# Patient Record
Sex: Female | Born: 1990 | Race: White | Hispanic: No | Marital: Single | Smoking: Current every day smoker
Health system: Southern US, Community
[De-identification: ages and names within clinical notes are randomized; demographics above are authoritative.]

## PROBLEM LIST (undated history)

## (undated) ENCOUNTER — Inpatient Hospital Stay (HOSPITAL_COMMUNITY): Payer: Self-pay

## (undated) ENCOUNTER — Emergency Department (HOSPITAL_COMMUNITY): Admission: EM | Discharge status: Absent without leave | Payer: Medicaid Other

## (undated) ENCOUNTER — Inpatient Hospital Stay (HOSPITAL_COMMUNITY): Payer: Medicaid Other | Admitting: Obstetrics & Gynecology

## (undated) DIAGNOSIS — F418 Other specified anxiety disorders: Secondary | ICD-10-CM

## (undated) DIAGNOSIS — F909 Attention-deficit hyperactivity disorder, unspecified type: Secondary | ICD-10-CM

## (undated) DIAGNOSIS — F329 Major depressive disorder, single episode, unspecified: Secondary | ICD-10-CM

## (undated) DIAGNOSIS — F32A Depression, unspecified: Secondary | ICD-10-CM

## (undated) DIAGNOSIS — N390 Urinary tract infection, site not specified: Secondary | ICD-10-CM

## (undated) DIAGNOSIS — R87619 Unspecified abnormal cytological findings in specimens from cervix uteri: Secondary | ICD-10-CM

## (undated) DIAGNOSIS — F419 Anxiety disorder, unspecified: Secondary | ICD-10-CM

## (undated) DIAGNOSIS — M549 Dorsalgia, unspecified: Secondary | ICD-10-CM

## (undated) DIAGNOSIS — G8929 Other chronic pain: Secondary | ICD-10-CM

## (undated) DIAGNOSIS — IMO0002 Reserved for concepts with insufficient information to code with codable children: Secondary | ICD-10-CM

## (undated) DIAGNOSIS — G43909 Migraine, unspecified, not intractable, without status migrainosus: Secondary | ICD-10-CM

## (undated) DIAGNOSIS — R87629 Unspecified abnormal cytological findings in specimens from vagina: Secondary | ICD-10-CM

## (undated) HISTORY — DX: Depression, unspecified: F32.A

## (undated) HISTORY — DX: Reserved for concepts with insufficient information to code with codable children: IMO0002

## (undated) HISTORY — DX: Major depressive disorder, single episode, unspecified: F32.9

## (undated) HISTORY — PX: TOOTH EXTRACTION: SUR596

## (undated) HISTORY — DX: Unspecified abnormal cytological findings in specimens from vagina: R87.629

## (undated) HISTORY — DX: Attention-deficit hyperactivity disorder, unspecified type: F90.9

## (undated) HISTORY — DX: Unspecified abnormal cytological findings in specimens from cervix uteri: R87.619

## (undated) HISTORY — DX: Urinary tract infection, site not specified: N39.0

---
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
## 2012-08-21 LAB — OB RESULTS CONSOLE GBS: GBS: POSITIVE

## 2012-09-01 ENCOUNTER — Encounter (HOSPITAL_COMMUNITY): Payer: Self-pay | Admitting: *Deleted

## 2012-09-01 ENCOUNTER — Emergency Department (HOSPITAL_COMMUNITY)
Admission: EM | Admit: 2012-09-01 | Discharge: 2012-09-01 | Disposition: A | Discharge status: Home / Self Care | Payer: Self-pay | Attending: Emergency Medicine | Admitting: Emergency Medicine

## 2012-09-01 DIAGNOSIS — Z3202 Encounter for pregnancy test, result negative: Secondary | ICD-10-CM | POA: Insufficient documentation

## 2012-09-01 DIAGNOSIS — N898 Other specified noninflammatory disorders of vagina: Secondary | ICD-10-CM | POA: Insufficient documentation

## 2012-09-01 DIAGNOSIS — F17200 Nicotine dependence, unspecified, uncomplicated: Secondary | ICD-10-CM | POA: Insufficient documentation

## 2012-09-01 DIAGNOSIS — N939 Abnormal uterine and vaginal bleeding, unspecified: Secondary | ICD-10-CM

## 2012-09-01 DIAGNOSIS — R197 Diarrhea, unspecified: Secondary | ICD-10-CM | POA: Insufficient documentation

## 2012-09-01 DIAGNOSIS — Z79899 Other long term (current) drug therapy: Secondary | ICD-10-CM | POA: Insufficient documentation

## 2012-09-01 DIAGNOSIS — R109 Unspecified abdominal pain: Secondary | ICD-10-CM | POA: Insufficient documentation

## 2012-09-01 LAB — POCT PREGNANCY, URINE: Preg Test, Ur: NEGATIVE

## 2012-09-01 MED ORDER — IBUPROFEN 600 MG PO TABS: 600.0000 mg | ORAL_TABLET | Freq: Four times a day (QID) | ORAL | Status: DC | PRN

## 2012-09-01 MED ORDER — IBUPROFEN 800 MG PO TABS
800.0000 mg | ORAL_TABLET | Freq: Once | ORAL | Status: AC
  Administered 2012-09-01: 800 mg via ORAL
  Filled 2012-09-01: qty 1

## 2012-09-01 NOTE — ED Notes (Signed)
Pt reports heavy bleeding x 7 days. Pt has used 6 pads in a 24 hr time frame. Pt also c/o lower abdominal pain with cramping and diarrhea 2 days daily. Reports taking 3 pregnancy tests "lately with negative results".   Denies N/V and fever. NAD noted

## 2012-09-01 NOTE — ED Provider Notes (Signed)
History   This chart was scribed for Jessica Gaskins, MD by Charolett Bumpers, ED Scribe. The patient was seen in room APA08/APA08. Patient's care was started at 1505.   CSN: [id number]  Arrival date & time 09/01/12  1420   First MD Initiated Contact with Patient 09/01/12 1505      Chief Complaint  Patient presents with  . Vaginal Bleeding    The history is provided by the patient. No language interpreter was used.   Jessica [name] is a 22 y.o. female who presents to the Emergency Department complaining of constant, moderate vaginal bleeding for the past week with associated lower abdominal cramping. She states that the bleeding is worsening and is using 5-6 pads daily. She denies any syncope, weakness, dizziness, back pain, dysuria. She denies any prior medical hx. She states that her LNMP was last month and has not had any positive pregnancy tests. Marland Kitchen    PMH - none  Past Surgical History  Procedure Date  . Tooth extraction     No family history on file.  History  Substance Use Topics  . Smoking status: Current Every Day Smoker  . Smokeless tobacco: Not on file  . Alcohol Use: No    OB History    Grav Para Term Preterm Abortions TAB SAB Ect Mult Living                  Review of Systems  Gastrointestinal: Positive for abdominal pain.  Genitourinary: Positive for vaginal bleeding. Negative for dysuria.  Musculoskeletal: Negative for back pain.  Neurological: Negative for dizziness, syncope and weakness.  All other systems reviewed and are negative.    Allergies  Review of patient's allergies indicates no known allergies.  Home Medications   Current Outpatient Rx  Name  Route  Sig  Dispense  Refill  . NORGESTIM-ETH ESTRAD TRIPHASIC 0.18/0.215/0.25 MG-35 MCG PO TABS   Oral   Take 1 tablet by mouth daily.           BP 134/73  Pulse 100  Temp 97.7 F (36.5 C) (Oral)  Resp 20  Ht 5\' 4"  (1.626 m)  Wt 146 lb 1 oz (66.254 kg)  BMI 25.07 kg/m2   SpO2 100%  LMP 08/25/2012  Physical Exam CONSTITUTIONAL: Well developed/well nourished HEAD AND FACE: Normocephalic/atraumatic EYES: EOMI/PERRL, conjunctiva pink ENMT: Mucous membranes moist NECK: supple no meningeal signs SPINE:entire spine nontender CV: S1/S2 noted, no murmurs/rubs/gallops noted LUNGS: Lungs are clear to auscultation bilaterally, no apparent distress ABDOMEN: soft, nontender, no rebound or guarding GU:no cva tenderness, small amount of vaginal blood noted. No active bleeding. Os closed. Chaperone present.  NEURO: Pt is awake/alert, moves all extremitiesx4 EXTREMITIES: pulses normal, full ROM SKIN: warm, color normal PSYCH: no abnormalities of mood noted  ED Course  Procedures   DIAGNOSTIC STUDIES: Oxygen Saturation is 100% on room air, normal by my interpretation.    COORDINATION OF CARE:  15:12-Discussed planned course of treatment with the patient including a urine preg and pelvic exam, who is agreeable at this time.   15:15-Medication Orders: Ibuprofen (Advil, Motrin) tablet 800 mg-once.   15:20-Pelvic exam preformed with chaperon present.  Stable for d/c and followup with gyn.  She is not pregnant, no distress and does not appear clinically to be acutely anemic     MDM  Nursing notes including past medical history and social history reviewed and considered in documentation Labs/vital reviewed and considered     I personally performed the services  described in this documentation, which was scribed in my presence. The recorded information has been reviewed and is accurate.        Jessica Gaskins, MD 09/01/12 [phone]

## 2012-09-01 NOTE — ED Notes (Signed)
Pt states vaginal bleeding x 1 week. Pain to lower abdomen.

## 2012-11-26 ENCOUNTER — Encounter (HOSPITAL_COMMUNITY): Payer: Self-pay | Admitting: *Deleted

## 2012-11-26 ENCOUNTER — Emergency Department (HOSPITAL_COMMUNITY)
Admission: EM | Admit: 2012-11-26 | Discharge: 2012-11-27 | Disposition: A | Discharge status: Home / Self Care | Payer: Self-pay | Attending: Emergency Medicine | Admitting: Emergency Medicine

## 2012-11-26 ENCOUNTER — Emergency Department (HOSPITAL_COMMUNITY): Payer: Self-pay

## 2012-11-26 DIAGNOSIS — F17200 Nicotine dependence, unspecified, uncomplicated: Secondary | ICD-10-CM | POA: Insufficient documentation

## 2012-11-26 DIAGNOSIS — R110 Nausea: Secondary | ICD-10-CM | POA: Insufficient documentation

## 2012-11-26 DIAGNOSIS — Z3202 Encounter for pregnancy test, result negative: Secondary | ICD-10-CM | POA: Insufficient documentation

## 2012-11-26 DIAGNOSIS — R109 Unspecified abdominal pain: Secondary | ICD-10-CM

## 2012-11-26 LAB — URINALYSIS, ROUTINE W REFLEX MICROSCOPIC
Bilirubin Urine: NEGATIVE
Glucose, UA: NEGATIVE mg/dL
Hgb urine dipstick: NEGATIVE
Ketones, ur: NEGATIVE mg/dL
Leukocytes, UA: NEGATIVE
Nitrite: NEGATIVE
Protein, ur: NEGATIVE mg/dL
Specific Gravity, Urine: 1.015 (ref 1.005–1.030)
Urobilinogen, UA: 0.2 mg/dL (ref 0.0–1.0)
pH: 6 (ref 5.0–8.0)

## 2012-11-26 LAB — PREGNANCY, URINE: Preg Test, Ur: NEGATIVE

## 2012-11-26 MED ORDER — ONDANSETRON HCL 4 MG/2ML IJ SOLN
4.0000 mg | Freq: Once | INTRAMUSCULAR | Status: AC
  Administered 2012-11-26: 4 mg via INTRAVENOUS
  Filled 2012-11-26: qty 2

## 2012-11-26 MED ORDER — SODIUM CHLORIDE 0.9 % IV BOLUS (SEPSIS)
1000.0000 mL | Freq: Once | INTRAVENOUS | Status: AC
  Administered 2012-11-26: 1000 mL via INTRAVENOUS

## 2012-11-26 MED ORDER — MORPHINE SULFATE 4 MG/ML IJ SOLN
4.0000 mg | Freq: Once | INTRAMUSCULAR | Status: AC
  Administered 2012-11-26: 4 mg via INTRAVENOUS
  Filled 2012-11-26: qty 1

## 2012-11-26 MED ORDER — SODIUM CHLORIDE 0.9 % IV SOLN
Freq: Once | INTRAVENOUS | Status: AC
  Administered 2012-11-27: via INTRAVENOUS

## 2012-11-26 NOTE — ED Provider Notes (Signed)
History    This chart was scribed for [employer]. Colon Branch, MD by Donne Anon, ED Scribe. This patient was seen in room APA17/APA17 and the patient's care was started at 2259.   CSN: [id number]  Arrival date & time 11/26/12  2227   First MD Initiated Contact with Patient 11/26/12 2259      Chief Complaint  Patient presents with  . Abdominal Pain  . Nausea     The history is provided by the patient. No language interpreter was used.   Jessica [name] is a 22 y.o. female who presents to the Emergency Department complaining of gradual onset, constant lower abdominal pain which began 3 hours PTA. She reports associated nausea. She denies emesis, dysuria, diarrhea or any other pain. Her last bowel movement was yesterday and was normal. Her last meal was at 3 PM.  History reviewed. No pertinent past medical history.  Past Surgical History  Procedure Laterality Date  . Tooth extraction      History reviewed. No pertinent family history.  History  Substance Use Topics  . Smoking status: Current Every Day Smoker  . Smokeless tobacco: Not on file  . Alcohol Use: No    OB History   Grav Para Term Preterm Abortions TAB SAB Ect Mult Living                  Review of Systems  Constitutional: Negative for fever.       10 Systems reviewed and are negative for acute change except as noted in the HPI.  HENT: Negative for congestion.   Eyes: Negative for discharge and redness.  Respiratory: Negative for cough and shortness of breath.   Cardiovascular: Negative for chest pain.  Gastrointestinal: Positive for nausea and abdominal pain. Negative for vomiting and diarrhea.  Genitourinary: Negative for dysuria.  Musculoskeletal: Negative for back pain.  Skin: Negative for rash.  Neurological: Negative for syncope, numbness and headaches.  Psychiatric/Behavioral:       No behavior change.  All other systems reviewed and are negative.    Allergies  Review of patient's allergies  indicates no known allergies.  Home Medications   Current Outpatient Rx  Name  Route  Sig  Dispense  Refill  . Norgestimate-Ethinyl Estradiol Triphasic (TRI-PREVIFEM) 0.18/0.215/0.25 MG-35 MCG tablet   Oral   Take 1 tablet by mouth daily.           BP 127/75  Pulse 100  Temp(Src) 98.4 F (36.9 C) (Oral)  Resp 18  Ht 5\' 6"  (1.676 m)  Wt 140 lb (63.504 kg)  BMI 22.61 kg/m2  SpO2 100%  LMP 11/05/2012  Physical Exam  Nursing note and vitals reviewed. Constitutional: She is oriented to person, place, and time. She appears well-developed and well-nourished. No distress.  HENT:  Head: Normocephalic and atraumatic.  Eyes: EOM are normal.  Neck: Neck supple. No tracheal deviation present.  Cardiovascular: Normal rate, regular rhythm and normal heart sounds.   Pulmonary/Chest: Effort normal and breath sounds normal. No respiratory distress.  Abdominal: Soft. Bowel sounds are normal.  Suprapubic pain to palpation.  Musculoskeletal: Normal range of motion.  Neurological: She is alert and oriented to person, place, and time.  Skin: Skin is warm and dry.  Psychiatric: She has a normal mood and affect. Her behavior is normal.    ED Course  Procedures (including critical care time) Results for orders placed during the hospital encounter of 11/26/12  URINALYSIS, ROUTINE W REFLEX MICROSCOPIC  Result Value Range   Color, Urine YELLOW  YELLOW   APPearance CLEAR  CLEAR   Specific Gravity, Urine 1.015  1.005 - 1.030   pH 6.0  5.0 - 8.0   Glucose, UA NEGATIVE  NEGATIVE mg/dL   Hgb urine dipstick NEGATIVE  NEGATIVE   Bilirubin Urine NEGATIVE  NEGATIVE   Ketones, ur NEGATIVE  NEGATIVE mg/dL   Protein, ur NEGATIVE  NEGATIVE mg/dL   Urobilinogen, UA 0.2  0.0 - 1.0 mg/dL   Nitrite NEGATIVE  NEGATIVE   Leukocytes, UA NEGATIVE  NEGATIVE  PREGNANCY, URINE      Result Value Range   Preg Test, Ur NEGATIVE  NEGATIVE   Dg Abd Acute W/chest  11/27/2012  *RADIOLOGY REPORT*  Clinical  Data: Lower abdominal pain, nausea, dizziness for 4 hours  ACUTE ABDOMEN SERIES (ABDOMEN 2 VIEW & CHEST 1 VIEW)  Comparison: None  Findings: Normal heart size, mediastinal contours, and pulmonary vascularity. Lungs clear. No pleural effusion or pneumothorax. Jewelry artifact projects over lumbar spine. Normal bowel gas pattern. No bowel dilatation, bowel wall thickening or free intraperitoneal air. No acute osseous findings.  IMPRESSION: No acute abnormalities.   Original Report Authenticated By: Ulyses Southward, M.D.     DIAGNOSTIC STUDIES: Oxygen Saturation is 100% on room air, normal by my interpretation.    COORDINATION OF CARE: 11:17 PM Discussed treatment plan which includes IV medication and urinalysis with pt at bedside and pt agreed to plan.   Results for orders placed during the hospital encounter of 11/26/12  URINALYSIS, ROUTINE W REFLEX MICROSCOPIC      Result Value Range   Color, Urine YELLOW  YELLOW   APPearance CLEAR  CLEAR   Specific Gravity, Urine 1.015  1.005 - 1.030   pH 6.0  5.0 - 8.0   Glucose, UA NEGATIVE  NEGATIVE mg/dL   Hgb urine dipstick NEGATIVE  NEGATIVE   Bilirubin Urine NEGATIVE  NEGATIVE   Ketones, ur NEGATIVE  NEGATIVE mg/dL   Protein, ur NEGATIVE  NEGATIVE mg/dL   Urobilinogen, UA 0.2  0.0 - 1.0 mg/dL   Nitrite NEGATIVE  NEGATIVE   Leukocytes, UA NEGATIVE  NEGATIVE  PREGNANCY, URINE      Result Value Range   Preg Test, Ur NEGATIVE  NEGATIVE   Medications  sodium chloride 0.9 % bolus 1,000 mL (0 mLs Intravenous Stopped 11/27/12 0020)  0.9 %  sodium chloride infusion ( Intravenous Stopped 11/27/12 0124)  morphine 4 MG/ML injection 4 mg (4 mg Intravenous Given 11/26/12 2337)  ondansetron (ZOFRAN) injection 4 mg (4 mg Intravenous Given 11/26/12 2337)   Dg Abd Acute W/chest  11/27/2012  *RADIOLOGY REPORT*  Clinical Data: Lower abdominal pain, nausea, dizziness for 4 hours  ACUTE ABDOMEN SERIES (ABDOMEN 2 VIEW & CHEST 1 VIEW)  Comparison: None  Findings: Normal  heart size, mediastinal contours, and pulmonary vascularity. Lungs clear. No pleural effusion or pneumothorax. Jewelry artifact projects over lumbar spine. Normal bowel gas pattern. No bowel dilatation, bowel wall thickening or free intraperitoneal air. No acute osseous findings.  IMPRESSION: No acute abnormalities.   Original Report Authenticated By: Ulyses Southward, M.D.      MDM  Patient with lower abdominal pain and nausea. Given IVF, morphine, zofran with improvement. UA was negative. Acute abdominal series negative. Reviewed results with patient. Pt stable in ED with no significant deterioration in condition.The patient appears reasonably screened and/or stabilized for discharge and I doubt any other medical condition or other [facility] requiring further screening, evaluation, or treatment in  the ED at this time prior to discharge.  I personally performed the services described in this documentation, which was scribed in my presence. The recorded information has been reviewed and considered.   MDM Reviewed: nursing note and vitals Interpretation: labs and x-ray         Nicoletta Dress. Colon Branch, MD 11/27/12 [phone]

## 2012-11-26 NOTE — ED Notes (Signed)
Pt to department via EMS, c/o lower abdominal pain starting about 1 hour ago.  C/O mild nausea, no vomiting.  Pt reports mild burning with urination.

## 2012-11-27 MED ORDER — ONDANSETRON 4 MG PO TBDP: 4.0000 mg | ORAL_TABLET | Freq: Three times a day (TID) | ORAL | Status: DC | PRN

## 2012-11-27 NOTE — ED Notes (Addendum)
Patient requesting something else for pain and if she can eat, Dr. Colon Branch notified.

## 2013-01-05 ENCOUNTER — Other Ambulatory Visit: Payer: Self-pay | Admitting: Obstetrics & Gynecology

## 2013-01-05 DIAGNOSIS — O3680X0 Pregnancy with inconclusive fetal viability, not applicable or unspecified: Secondary | ICD-10-CM

## 2013-01-10 ENCOUNTER — Ambulatory Visit: Payer: Self-pay

## 2013-01-12 ENCOUNTER — Ambulatory Visit (INDEPENDENT_AMBULATORY_CARE_PROVIDER_SITE_OTHER): Payer: Medicaid Other | Admitting: Adult Health

## 2013-01-12 ENCOUNTER — Encounter: Payer: Self-pay | Admitting: Adult Health

## 2013-01-12 VITALS — BP 120/60 | Ht 64.0 in | Wt 150.0 lb

## 2013-01-12 DIAGNOSIS — R300 Dysuria: Secondary | ICD-10-CM

## 2013-01-12 DIAGNOSIS — N390 Urinary tract infection, site not specified: Secondary | ICD-10-CM | POA: Insufficient documentation

## 2013-01-12 HISTORY — DX: Urinary tract infection, site not specified: N39.0

## 2013-01-12 LAB — POCT URINALYSIS DIPSTICK
Blood, UA: NEGATIVE
Glucose, UA: NEGATIVE
Ketones, UA: NEGATIVE
Nitrite, UA: NEGATIVE
Protein, UA: NEGATIVE

## 2013-01-12 MED ORDER — CEPHALEXIN 500 MG PO CAPS: 500.0000 mg | ORAL_CAPSULE | Freq: Four times a day (QID) | ORAL | Status: DC

## 2013-01-12 NOTE — Progress Notes (Signed)
Subjective:     Patient ID: Jessica [name], female   DOB: 1990-11-13, 22 y.o.   MRN: [id number]  HPI Jessica [name] is a 22 year old white female, who is early pregnant in complaining of burning with urination x 1 week.Denies vaginal discharge or lesions.  Review of Systems Positives as in HPI   Reviewed past medical,surgical, social and family history. Reviewed medications and allergies.  Objective:   Physical Exam BP 120/60  Ht 5\' 4"  (1.626 m)  Wt 150 lb (68.04 kg)  BMI 25.73 kg/m2  LMP 12/05/2012   Urine dipstick 2+leuk. Skin warm and dry.Pelvic: external genitalia is normal in appearance, vagina:good color, moisture and rugae,no discharge.cervix:smooth with no CMT, uterus: normal size, shape and contour, non tender, no masses felt, adnexa: no masses or tenderness noted. Assessment:      UTI   early pregnancy Plan:      UA C&S Rx Keflex 500 mg 1 qid x 7 days Keep 6/16 appt for [place]

## 2013-01-12 NOTE — Patient Instructions (Addendum)
Push fluids esp water Take keflex Keep 6/16 [place] appt  Handouts for new ob given

## 2013-01-13 ENCOUNTER — Ambulatory Visit: Payer: Self-pay | Admitting: Adult Health

## 2013-01-13 LAB — URINALYSIS
Bilirubin Urine: NEGATIVE
Glucose, UA: NEGATIVE mg/dL
Hgb urine dipstick: NEGATIVE
Ketones, ur: NEGATIVE mg/dL
Nitrite: NEGATIVE
Protein, ur: NEGATIVE mg/dL
Specific Gravity, Urine: 1.025 (ref 1.005–1.030)
Urobilinogen, UA: 0.2 mg/dL (ref 0.0–1.0)
pH: 6.5 (ref 5.0–8.0)

## 2013-01-14 LAB — URINE CULTURE
Colony Count: NO GROWTH
Organism ID, Bacteria: NO GROWTH

## 2013-01-19 ENCOUNTER — Encounter: Payer: Medicaid Other | Admitting: Advanced Practice Midwife

## 2013-01-19 ENCOUNTER — Telehealth: Payer: Self-pay | Admitting: *Deleted

## 2013-01-19 NOTE — Telephone Encounter (Signed)
Spoke with pt. Having bad headaches. Tylenol not helping. Having some congestion, no fever, + dizziness, no blurred vision. Does have a history of headaches. Offered appt today at 3:45 but pt unable to make it. Pt sent to front desk to schedule appt. JSY

## 2013-01-20 ENCOUNTER — Encounter: Payer: Medicaid Other | Admitting: Advanced Practice Midwife

## 2013-01-20 ENCOUNTER — Ambulatory Visit (INDEPENDENT_AMBULATORY_CARE_PROVIDER_SITE_OTHER): Payer: Medicaid Other | Admitting: Advanced Practice Midwife

## 2013-01-20 ENCOUNTER — Other Ambulatory Visit: Payer: Self-pay

## 2013-01-20 ENCOUNTER — Encounter: Payer: Self-pay | Admitting: Advanced Practice Midwife

## 2013-01-20 VITALS — BP 110/70 | Wt 150.0 lb

## 2013-01-20 DIAGNOSIS — Z331 Pregnant state, incidental: Secondary | ICD-10-CM

## 2013-01-20 DIAGNOSIS — Z1389 Encounter for screening for other disorder: Secondary | ICD-10-CM

## 2013-01-20 DIAGNOSIS — O9989 Other specified diseases and conditions complicating pregnancy, childbirth and the puerperium: Secondary | ICD-10-CM

## 2013-01-20 LAB — POCT URINALYSIS DIPSTICK
Blood, UA: NEGATIVE
Glucose, UA: NEGATIVE
Ketones, UA: NEGATIVE
Leukocytes, UA: NEGATIVE
Nitrite, UA: NEGATIVE
Protein, UA: NEGATIVE

## 2013-01-20 NOTE — Progress Notes (Signed)
Got dizzy on Tuesday.  Sounds like B/P.  Tips given.  Taking one 325mg  tylenol q 8-9 hrs for HA.  May increase dosage to 650mg  q 4-6 hours.  Has u/s appt Mondayl

## 2013-01-24 ENCOUNTER — Other Ambulatory Visit: Payer: Self-pay

## 2013-01-24 ENCOUNTER — Other Ambulatory Visit: Payer: Self-pay | Admitting: Obstetrics & Gynecology

## 2013-01-24 ENCOUNTER — Ambulatory Visit (INDEPENDENT_AMBULATORY_CARE_PROVIDER_SITE_OTHER): Payer: Medicaid Other

## 2013-01-24 DIAGNOSIS — O3680X0 Pregnancy with inconclusive fetal viability, not applicable or unspecified: Secondary | ICD-10-CM

## 2013-01-24 NOTE — Progress Notes (Signed)
U/S-single IUP with +FCA noted, CRL c/w LMP dates, cx long and closed, bilateral adnexa wnl

## 2013-02-08 ENCOUNTER — Encounter: Payer: Self-pay | Admitting: Advanced Practice Midwife

## 2013-02-08 ENCOUNTER — Ambulatory Visit (INDEPENDENT_AMBULATORY_CARE_PROVIDER_SITE_OTHER): Payer: Medicaid Other | Admitting: Advanced Practice Midwife

## 2013-02-08 VITALS — BP 128/80 | Wt 148.0 lb

## 2013-02-08 DIAGNOSIS — Z3490 Encounter for supervision of normal pregnancy, unspecified, unspecified trimester: Secondary | ICD-10-CM

## 2013-02-08 DIAGNOSIS — F418 Other specified anxiety disorders: Secondary | ICD-10-CM | POA: Insufficient documentation

## 2013-02-08 DIAGNOSIS — Z3400 Encounter for supervision of normal first pregnancy, unspecified trimester: Secondary | ICD-10-CM | POA: Insufficient documentation

## 2013-02-08 DIAGNOSIS — F32A Depression, unspecified: Secondary | ICD-10-CM

## 2013-02-08 DIAGNOSIS — O99340 Other mental disorders complicating pregnancy, unspecified trimester: Secondary | ICD-10-CM

## 2013-02-08 DIAGNOSIS — F329 Major depressive disorder, single episode, unspecified: Secondary | ICD-10-CM

## 2013-02-08 DIAGNOSIS — Z331 Pregnant state, incidental: Secondary | ICD-10-CM

## 2013-02-08 DIAGNOSIS — Z1389 Encounter for screening for other disorder: Secondary | ICD-10-CM

## 2013-02-08 LAB — POCT URINALYSIS DIPSTICK
Blood, UA: NEGATIVE
Glucose, UA: NEGATIVE
Ketones, UA: NEGATIVE
Nitrite, UA: NEGATIVE
Protein, UA: NEGATIVE

## 2013-02-08 LAB — CBC
HCT: 38.7 % (ref 36.0–46.0)
Hemoglobin: 13.2 g/dL (ref 12.0–15.0)
MCH: 28.2 pg (ref 26.0–34.0)
MCHC: 34.1 g/dL (ref 30.0–36.0)
MCV: 82.7 fL (ref 78.0–100.0)
Platelets: 275 10*3/uL (ref 150–400)
RBC: 4.68 MIL/uL (ref 3.87–5.11)
RDW: 14.3 % (ref 11.5–15.5)
WBC: 7.8 10*3/uL (ref 4.0–10.5)

## 2013-02-08 LAB — OB RESULTS CONSOLE GC/CHLAMYDIA
Chlamydia: NEGATIVE
Gonorrhea: NEGATIVE

## 2013-02-08 NOTE — Progress Notes (Signed)
No complaints at this time. PN 1 today.

## 2013-02-08 NOTE — Addendum Note (Signed)
Addended by: Jacklyn Shell on: 02/08/2013 11:35 AM   Modules accepted: Orders

## 2013-02-08 NOTE — Progress Notes (Signed)
  Subjective:    Jessica [name] is a G1P0 [redacted]w[redacted]d being seen today for her first obstetrical visit.    Patient reports no complaints. Lives with FOB.  No depression in several years Filed Vitals:   02/08/13 1050  BP: 128/80  Weight: 138 lb 12 oz (62.937 kg)    HISTORY: OB History   Grav Para Term Preterm Abortions TAB SAB Ect Mult Living   1              # Outc Date GA Lbr Len/2nd Wgt Sex Del Anes PTL Lv   1 CUR              Past Medical History  Diagnosis Date  . Abnormal Pap smear   . UTI (lower urinary tract infection) 01/12/2013  . Depression    Past Surgical History  Procedure Laterality Date  . Tooth extraction     Family History  Problem Relation Age of Onset  . Diabetes Maternal Grandmother   . Diabetes Paternal Grandmother      Exam                                      System: Breast:  normal appearance, no masses or tenderness   Skin: normal coloration and turgor, no rashes    Neurologic: oriented, normal, normal mood   Extremities: normal strength, tone, and muscle mass   HEENT PERRLA   Mouth/Teeth mucous membranes moist, pharynx normal without lesions   Neck supple and no masses   Cardiovascular: regular rate and rhythm   Respiratory:  appears well, vitals normal, no respiratory distress, acyanotic, normal RR   Abdomen: soft, non-tender; bowel sounds normal; no masses,  no organomegaly          Assessment:    Pregnancy: G1P0 Patient Active Problem List   Diagnosis Date Noted  . Pregnant 02/08/2013  . Depression   . UTI (lower urinary tract infection) 01/12/2013        Plan:     Initial labs drawn. Prenatal vitamins. Problem list reviewed and updated. Genetic Screening discussed First Screen: requested.  Ultrasound discussed; fetal survey: requested.  Follow up in 3 weeks for NT/IT  CRESENZO-DISHMAN,Varshini Arrants 02/08/2013

## 2013-02-09 LAB — URINALYSIS, ROUTINE W REFLEX MICROSCOPIC
Bilirubin Urine: NEGATIVE
Glucose, UA: NEGATIVE mg/dL
Hgb urine dipstick: NEGATIVE
Ketones, ur: NEGATIVE mg/dL
Nitrite: NEGATIVE
Protein, ur: NEGATIVE mg/dL
Specific Gravity, Urine: 1.018 (ref 1.005–1.030)
Urobilinogen, UA: 0.2 mg/dL (ref 0.0–1.0)
pH: 6 (ref 5.0–8.0)

## 2013-02-09 LAB — DRUG SCREEN, URINE, NO CONFIRMATION
Amphetamine Screen, Ur: NEGATIVE
Barbiturate Quant, Ur: NEGATIVE
Benzodiazepines.: NEGATIVE
Cocaine Metabolites: NEGATIVE
Creatinine,U: 235.5 mg/dL
Marijuana Metabolite: NEGATIVE
Methadone: NEGATIVE
Opiate Screen, Urine: NEGATIVE
Phencyclidine (PCP): NEGATIVE
Propoxyphene: NEGATIVE

## 2013-02-09 LAB — URINALYSIS, MICROSCOPIC ONLY
Casts: NONE SEEN
Crystals: NONE SEEN

## 2013-02-09 LAB — OXYCODONE SCREEN, UA, RFLX CONFIRM: Oxycodone Screen, Ur: NEGATIVE ng/mL

## 2013-02-09 LAB — ABO AND RH: Rh Type: NEGATIVE

## 2013-02-09 LAB — RPR

## 2013-02-09 LAB — HEPATITIS B SURFACE ANTIGEN: Hepatitis B Surface Ag: NEGATIVE

## 2013-02-09 LAB — ANTIBODY SCREEN: Antibody Screen: NEGATIVE

## 2013-02-09 LAB — GC/CHLAMYDIA PROBE AMP
CT Probe RNA: NEGATIVE
GC Probe RNA: NEGATIVE

## 2013-02-09 LAB — HIV ANTIBODY (ROUTINE TESTING W REFLEX): HIV: NONREACTIVE

## 2013-02-10 LAB — URINE CULTURE
Colony Count: NO GROWTH
Organism ID, Bacteria: NO GROWTH

## 2013-02-10 LAB — VARICELLA ZOSTER ANTIBODY, IGG: Varicella IgG: 1139 Index — ABNORMAL HIGH (ref <135.00)

## 2013-02-10 LAB — CYSTIC FIBROSIS DIAGNOSTIC STUDY

## 2013-02-10 LAB — RUBELLA SCREEN: Rubella: 2.55 Index — ABNORMAL HIGH (ref <0.90)

## 2013-02-18 ENCOUNTER — Telehealth: Payer: Self-pay | Admitting: Obstetrics and Gynecology

## 2013-02-18 NOTE — Telephone Encounter (Signed)
Spoke with pt. Top of mouth and fingers are numb. Vomiting yesterday. Headache all day yesterday. appt scheduled to come in next week to have this addressed.

## 2013-02-22 ENCOUNTER — Encounter: Payer: Medicaid Other | Admitting: Women's Health

## 2013-02-22 ENCOUNTER — Encounter: Payer: Self-pay | Admitting: *Deleted

## 2013-02-24 ENCOUNTER — Encounter: Payer: Self-pay | Admitting: Obstetrics & Gynecology

## 2013-02-24 ENCOUNTER — Ambulatory Visit (INDEPENDENT_AMBULATORY_CARE_PROVIDER_SITE_OTHER): Payer: Medicaid Other | Admitting: Obstetrics & Gynecology

## 2013-02-24 VITALS — BP 118/80 | Wt 152.5 lb

## 2013-02-24 DIAGNOSIS — Z331 Pregnant state, incidental: Secondary | ICD-10-CM

## 2013-02-24 DIAGNOSIS — Z1389 Encounter for screening for other disorder: Secondary | ICD-10-CM

## 2013-02-24 DIAGNOSIS — O99340 Other mental disorders complicating pregnancy, unspecified trimester: Secondary | ICD-10-CM

## 2013-02-24 DIAGNOSIS — O360990 Maternal care for other rhesus isoimmunization, unspecified trimester, not applicable or unspecified: Secondary | ICD-10-CM

## 2013-02-24 LAB — POCT URINALYSIS DIPSTICK
Blood, UA: NEGATIVE
Glucose, UA: NEGATIVE
Ketones, UA: NEGATIVE
Nitrite, UA: NEGATIVE
Protein, UA: NEGATIVE

## 2013-02-24 NOTE — Progress Notes (Signed)
FHR 145, NT and IT next week

## 2013-03-01 ENCOUNTER — Other Ambulatory Visit: Payer: Self-pay | Admitting: Advanced Practice Midwife

## 2013-03-01 ENCOUNTER — Ambulatory Visit (INDEPENDENT_AMBULATORY_CARE_PROVIDER_SITE_OTHER): Payer: Medicaid Other | Admitting: Advanced Practice Midwife

## 2013-03-01 ENCOUNTER — Ambulatory Visit (INDEPENDENT_AMBULATORY_CARE_PROVIDER_SITE_OTHER): Payer: Medicaid Other

## 2013-03-01 VITALS — BP 120/64 | Wt 153.5 lb

## 2013-03-01 DIAGNOSIS — Z3400 Encounter for supervision of normal first pregnancy, unspecified trimester: Secondary | ICD-10-CM

## 2013-03-01 DIAGNOSIS — Z36 Encounter for antenatal screening of mother: Secondary | ICD-10-CM

## 2013-03-01 DIAGNOSIS — Z331 Pregnant state, incidental: Secondary | ICD-10-CM

## 2013-03-01 DIAGNOSIS — O99340 Other mental disorders complicating pregnancy, unspecified trimester: Secondary | ICD-10-CM

## 2013-03-01 DIAGNOSIS — O360990 Maternal care for other rhesus isoimmunization, unspecified trimester, not applicable or unspecified: Secondary | ICD-10-CM

## 2013-03-01 DIAGNOSIS — Z1389 Encounter for screening for other disorder: Secondary | ICD-10-CM

## 2013-03-01 LAB — POCT URINALYSIS DIPSTICK
Blood, UA: NEGATIVE
Glucose, UA: NEGATIVE
Ketones, UA: NEGATIVE
Leukocytes, UA: NEGATIVE
Nitrite, UA: NEGATIVE
Protein, UA: NEGATIVE

## 2013-03-01 NOTE — Progress Notes (Signed)
IT with ultasound today. No complaints at this time. F/U 4 weeks for 2nd IT/LROB

## 2013-03-01 NOTE — Progress Notes (Signed)
U/S(12+2wks)-single active fetus, CRL c/w dates, cx long and closed, bilateral adnexa WNL, NB present, NT-1.44mm

## 2013-03-06 LAB — MATERNAL SCREEN, INTEGRATED #1

## 2013-03-25 ENCOUNTER — Encounter: Payer: Medicaid Other | Admitting: Adult Health

## 2013-03-28 ENCOUNTER — Encounter: Payer: Medicaid Other | Admitting: Obstetrics and Gynecology

## 2013-03-29 ENCOUNTER — Encounter: Payer: Medicaid Other | Admitting: Women's Health

## 2013-03-29 ENCOUNTER — Other Ambulatory Visit: Payer: Self-pay | Admitting: Obstetrics and Gynecology

## 2013-03-29 ENCOUNTER — Ambulatory Visit (INDEPENDENT_AMBULATORY_CARE_PROVIDER_SITE_OTHER): Payer: Medicaid Other | Admitting: Obstetrics and Gynecology

## 2013-03-29 VITALS — BP 124/66 | Wt 154.8 lb

## 2013-03-29 DIAGNOSIS — Z331 Pregnant state, incidental: Secondary | ICD-10-CM

## 2013-03-29 DIAGNOSIS — O360990 Maternal care for other rhesus isoimmunization, unspecified trimester, not applicable or unspecified: Secondary | ICD-10-CM

## 2013-03-29 DIAGNOSIS — Z1389 Encounter for screening for other disorder: Secondary | ICD-10-CM

## 2013-03-29 DIAGNOSIS — O99340 Other mental disorders complicating pregnancy, unspecified trimester: Secondary | ICD-10-CM

## 2013-03-29 DIAGNOSIS — Z3490 Encounter for supervision of normal pregnancy, unspecified, unspecified trimester: Secondary | ICD-10-CM

## 2013-03-29 LAB — POCT URINALYSIS DIPSTICK
Blood, UA: NEGATIVE
Glucose, UA: NEGATIVE
Ketones, UA: NEGATIVE
Nitrite, UA: NEGATIVE
Protein, UA: NEGATIVE

## 2013-03-29 MED ORDER — BUTALBITAL-ASPIRIN-CAFFEINE 50-325-40 MG PO CAPS: 1.0000 | ORAL_CAPSULE | Freq: Two times a day (BID) | ORAL | Status: DC | PRN

## 2013-03-29 NOTE — Progress Notes (Signed)
Pt c/o daily headaches, no improvement with Tylenol.

## 2013-03-29 NOTE — Patient Instructions (Addendum)
AFP Maternal This is a routine screen (tests) used to check for fetal abnormalities such as Down syndrome and neural tube defects. Down Syndrome is a chromosomal abnormality, sometimes called Trisomy 21. Neural tube defects are serious birth defects. The brain, spinal cord, or their coverings do not develop completely. Women should be tested in the 15th to 20th week of pregnancy. The msAFP screen involves three or four tests that measure substances found in the blood that make the testing better. During development, AFP levels in fetal blood and amniotic fluid rise until about 12 weeks. The levels then gradually fall until birth. AFP is a protein produce by fetal tissue. AFP crosses the placenta and appears in the maternal blood. A baby with an open neural tube defect has an opening in its spine, head, or abdominal wall that allows higher-than-usual amounts of AFP to pass into the mother's blood. If a screen is positive, more tests are needed to make a diagnosis. These include ultrasound and perhaps amniocentesis (checking the fluid that surrounds the baby). These tests are used to help women and their caregivers make decisions about the management of their pregnancies. In pregnancies where the fetus is carrying the chromosomal defect that results in Down syndrome, the levels of AFP and unconjugated estriol tend to be low and hCG and inhibin A levels high.  PREPARATION FOR TEST Blood is drawn from a vein in your arm usually between the 15th and 20th weeks of pregnancy. Four different tests on your blood are done. These are AFP, hCG, unconjugated estriol, and inhibin A. The combination of tests produces a more accurate result. NORMAL FINDINGS   Adult: less than 40ng/mL or less than 40 mg/L (SI units)  Child younger than1 year: less than 30 ng/mL Ranges are stratified by weeks of gestation and vary among laboratories. Ranges for normal findings may vary among different laboratories and hospitals. You  should always check with your doctor after having lab work or other tests done to discuss the meaning of your test results and whether your values are considered within normal limits. MEANING OF TEST  These are screening tests. Not all fetal abnormalities will give positive test results. Of all women who have positive AFP screening results, only a very small number of them have babies who actually have a neural tube defect or chromosomal abnormality. Your caregiver will go over the test results with you and discuss the importance and meaning of your results, as well as treatment options and the need for additional tests if necessary. OBTAINING THE TEST RESULTS It is your responsibility to obtain your test results. Ask the lab or department performing the test when and how you will get your results. Document Released: 08/19/2004 Document Revised: 10/20/2011 Document Reviewed: 07/01/2008 [employer] Patient Information 2014 [employer].  

## 2013-03-29 NOTE — Progress Notes (Signed)
Interested in childbirth classes

## 2013-04-01 LAB — MATERNAL SCREEN, INTEGRATED #2
AFP MoM: 0.94
AFP, Serum: 35.7 ng/mL
Age risk Down Syndrome: 1:1100 {titer}
Calculated Gestational Age: 16.3
Crown Rump Length: 59.8 mm
Estriol Mom: 1.16
Estriol, Free: 1.11 ng/mL
Inhibin A Dimeric: 261 pg/mL
Inhibin A MoM: 1.52
MSS Down Syndrome: <1:5000 {titer}
MSS Trisomy 18 Risk: <1:5000 {titer}
NT MoM: 0.99
Nuchal Translucency: 1.37 mm
Number of fetuses: 1
PAPP-A MoM: 0.49
PAPP-A: 387 ng/mL
Rish for ONTD: <1:5000 {titer}
hCG MoM: 1.35
hCG, Serum: 44.4 IU/mL

## 2013-04-03 ENCOUNTER — Encounter: Payer: Self-pay | Admitting: Obstetrics and Gynecology

## 2013-04-15 ENCOUNTER — Encounter (HOSPITAL_COMMUNITY): Payer: Self-pay | Admitting: *Deleted

## 2013-04-15 ENCOUNTER — Emergency Department (HOSPITAL_COMMUNITY)
Admission: EM | Admit: 2013-04-15 | Discharge: 2013-04-16 | Disposition: A | Discharge status: Home / Self Care | Payer: Medicaid Other | Attending: Emergency Medicine | Admitting: Emergency Medicine

## 2013-04-15 ENCOUNTER — Emergency Department (HOSPITAL_COMMUNITY): Payer: Medicaid Other

## 2013-04-15 DIAGNOSIS — O26892 Other specified pregnancy related conditions, second trimester: Secondary | ICD-10-CM

## 2013-04-15 DIAGNOSIS — Z87440 Personal history of urinary (tract) infections: Secondary | ICD-10-CM | POA: Insufficient documentation

## 2013-04-15 DIAGNOSIS — R0602 Shortness of breath: Secondary | ICD-10-CM | POA: Insufficient documentation

## 2013-04-15 DIAGNOSIS — Z87891 Personal history of nicotine dependence: Secondary | ICD-10-CM | POA: Insufficient documentation

## 2013-04-15 DIAGNOSIS — Z79899 Other long term (current) drug therapy: Secondary | ICD-10-CM | POA: Insufficient documentation

## 2013-04-15 DIAGNOSIS — Z8659 Personal history of other mental and behavioral disorders: Secondary | ICD-10-CM | POA: Insufficient documentation

## 2013-04-15 DIAGNOSIS — R0600 Dyspnea, unspecified: Secondary | ICD-10-CM

## 2013-04-15 DIAGNOSIS — O9989 Other specified diseases and conditions complicating pregnancy, childbirth and the puerperium: Secondary | ICD-10-CM | POA: Insufficient documentation

## 2013-04-15 NOTE — ED Notes (Signed)
Sob for 2 hours, pt is [redacted] weeks pregnant.   No abd pain, no discharge, No fever.

## 2013-04-15 NOTE — ED Provider Notes (Signed)
CSN: [id number]     Arrival date & time 04/15/13  2206 History   First MD Initiated Contact with Patient 04/15/13 2219     Chief Complaint  Patient presents with  . Shortness of Breath   (Consider location/radiation/quality/duration/timing/severity/associated sxs/prior Treatment) Patient is a 22 y.o. female presenting with shortness of breath. The history is provided by the patient.  Shortness of Breath Severity:  Moderate Duration:  2 hours Timing:  Constant Progression:  Unchanged Chronicity:  New Relieved by:  Nothing Worsened by:  Nothing tried Ineffective treatments:  None tried Associated symptoms: no abdominal pain, no cough, no fever, no headaches, no hemoptysis, no neck pain, no rash, no sore throat, no sputum production, no syncope and no vomiting  Chest pain: with cough only.   Risk factors: family hx of DVT and tobacco use   Risk factors: no hx of PE/DVT, no obesity, no prolonged immobilization and no recent surgery    Jessica [name] is a 22 y.o. G1P0 @ [redacted]w[redacted]d gestation who presents to the ED with shortness of breath that started approximately 2 hours prior to arrival to the ED. She states that she was going to take a nap and after lying down felt short of breath. She was lying flat on her back when the symptoms started so she turned to her side but still felt the same.   Past Medical History  Diagnosis Date  . Abnormal Pap smear   . UTI (lower urinary tract infection) 01/12/2013  . Depression   . Pregnant    Past Surgical History  Procedure Laterality Date  . Tooth extraction     Family History  Problem Relation Age of Onset  . Diabetes Maternal Grandmother   . Diabetes Paternal Grandmother    History  Substance Use Topics  . Smoking status: Former Smoker    Types: Cigarettes  . Smokeless tobacco: Never Used  . Alcohol Use: No   OB History   Grav Para Term Preterm Abortions TAB SAB Ect Mult Living   1              Review of Systems  Constitutional:  Negative for fever and chills.  HENT: Negative for sore throat and neck pain.   Eyes: Negative for visual disturbance.  Respiratory: Positive for shortness of breath. Negative for cough, hemoptysis, sputum production and chest tightness.   Cardiovascular: Negative for palpitations, leg swelling and syncope. Chest pain: with cough only.  Gastrointestinal: Negative for vomiting, abdominal pain, diarrhea and constipation.  Genitourinary: Negative for dysuria, frequency, vaginal bleeding, vaginal discharge and pelvic pain.  Musculoskeletal: Negative for back pain and joint swelling.  Skin: Negative for rash.  Neurological: Negative for syncope, light-headedness and headaches.  Psychiatric/Behavioral: The patient is not nervous/anxious.     Allergies  Review of patient's allergies indicates no known allergies.  Home Medications   Current Outpatient Rx  Name  Route  Sig  Dispense  Refill  . acetaminophen (TYLENOL) 325 MG tablet   Oral   Take 650 mg by mouth every 6 (six) hours as needed for pain.         . butalbital-aspirin-caffeine (FIORINAL) 50-325-40 MG per capsule   Oral   Take 1 capsule by mouth 2 (two) times daily as needed for headache.   14 capsule   1   . loratadine (CLARITIN) 10 MG tablet   Oral   Take 10 mg by mouth as needed for allergies.         Marland Kitchen  ondansetron (ZOFRAN ODT) 4 MG disintegrating tablet   Oral   Take 1 tablet (4 mg total) by mouth every 8 (eight) hours as needed for nausea.   20 tablet   0   . PRENATAL VITAMINS PO   Oral   Take 1 tablet by mouth daily.          BP 126/71  Pulse 87  Temp(Src) 98.7 F (37.1 C) (Oral)  Resp 20  Ht 5\' 4"  (1.626 m)  Wt 156 lb (70.761 kg)  BMI 26.76 kg/m2  SpO2 99%  LMP 12/05/2012 Physical Exam  Nursing note and vitals reviewed. Constitutional: She is oriented to person, place, and time. She appears well-developed and well-nourished. No distress.  Patient talking in full, long sentences without any  difficulty. O2 SAT 99% on R/A. Walking in the ED without appearing short of breath.  HENT:  Head: Normocephalic.  Mouth/Throat: Uvula is midline, oropharynx is clear and moist and mucous membranes are normal.  Eyes: EOM are normal.  Neck: Normal range of motion. Neck supple.  Cardiovascular: Normal rate, regular rhythm and normal heart sounds.   Pulmonary/Chest: Effort normal and breath sounds normal. No respiratory distress.  Abdominal: Soft. Bowel sounds are normal. There is no tenderness.  Gravid at 18.[redacted] weeks gestation. FHT 150  Musculoskeletal: Normal range of motion. She exhibits no edema and no tenderness.  Radial pulses strong with adequate circulation.  Neurological: She is alert and oriented to person, place, and time. No cranial nerve deficit.  Skin: Skin is warm and dry.  Psychiatric: She has a normal mood and affect. Her behavior is normal.    ED Course  Procedures Dg Chest 2 View  04/15/2013   *RADIOLOGY REPORT*  Clinical Data: Shortness of breath.  Chest pain.  [redacted] weeks pregnant.  Shielded.  CHEST - 2 VIEW  Comparison: 11/26/2012  Findings: The heart size and pulmonary vascularity are normal. The lungs appear clear and expanded without focal air space disease or consolidation. No blunting of the costophrenic angles.  No pneumothorax.  Mediastinal contours appear intact. Cervical ribs. No significant change since previous study.  IMPRESSION: No evidence of active pulmonary disease.   Original Report Authenticated By: Burman Nieves, M.D.    MDM  22 y.o. female with feeling of shortness of breath @ 18.[redacted] weeks gestation.  I have reviewed this patient's vital signs, nurses notes, appropriate imaging and discussed findings with the patieint. She voices understanding but request something for sleep prior to discharge. Will give Ambien 5 mg. PO. She is to follow up with Dr. Emelda Fear on Monday. Discussed with the patient that she may need to raise the head of the bed or rest in a  recliner rather than flat on her back during pregnancy. Patient is stable for discharge home without any immediate complications. Patient discharge with friends and walking at a rapid pace down the hall without difficulty.    Medication List    ASK your doctor about these medications       butalbital-aspirin-caffeine 50-325-40 MG per capsule  Commonly known as:  FIORINAL  Take 1 capsule by mouth 2 (two) times daily as needed for headache.     PRENATAL VITAMINS PO  Take 1 tablet by mouth daily.             [address] [place], [place] 04/16/13 [phone]

## 2013-04-16 MED ORDER — ZOLPIDEM TARTRATE 5 MG PO TABS
5.0000 mg | ORAL_TABLET | Freq: Once | ORAL | Status: AC
  Administered 2013-04-16: 5 mg via ORAL
  Filled 2013-04-16: qty 1

## 2013-04-16 NOTE — ED Provider Notes (Signed)
Medical screening examination/treatment/procedure(s) were conducted as a shared visit with non-physician practitioner(s) and myself.  I personally evaluated the patient during the encounter  G1P0 at [redacted] weeks gestation with SOB with lying down. No chest pain. Speaking in full sentences, lungs clear. No tachycardia or hypoxia. Low suspicion for PE.  Glynn Octave, MD 04/16/13 [phone]

## 2013-04-19 ENCOUNTER — Telehealth: Payer: Self-pay | Admitting: *Deleted

## 2013-04-19 MED ORDER — PRENATAL PLUS 27-1 MG PO TABS: 1.0000 | ORAL_TABLET | Freq: Every day | ORAL | Status: DC

## 2013-04-19 NOTE — Telephone Encounter (Signed)
Prenatals refilled

## 2013-04-25 ENCOUNTER — Other Ambulatory Visit: Payer: Self-pay | Admitting: Obstetrics & Gynecology

## 2013-04-25 DIAGNOSIS — Z1389 Encounter for screening for other disorder: Secondary | ICD-10-CM

## 2013-04-26 ENCOUNTER — Emergency Department (HOSPITAL_COMMUNITY)
Admission: EM | Admit: 2013-04-26 | Discharge: 2013-04-26 | Disposition: A | Discharge status: Home / Self Care | Payer: No Typology Code available for payment source | Attending: Emergency Medicine | Admitting: Emergency Medicine

## 2013-04-26 ENCOUNTER — Encounter: Payer: Self-pay | Admitting: Advanced Practice Midwife

## 2013-04-26 ENCOUNTER — Ambulatory Visit (INDEPENDENT_AMBULATORY_CARE_PROVIDER_SITE_OTHER): Payer: Medicaid Other

## 2013-04-26 ENCOUNTER — Other Ambulatory Visit: Payer: Medicaid Other

## 2013-04-26 ENCOUNTER — Ambulatory Visit (INDEPENDENT_AMBULATORY_CARE_PROVIDER_SITE_OTHER): Payer: Medicaid Other | Admitting: Advanced Practice Midwife

## 2013-04-26 ENCOUNTER — Encounter (HOSPITAL_COMMUNITY): Payer: Self-pay

## 2013-04-26 ENCOUNTER — Emergency Department (HOSPITAL_COMMUNITY): Payer: No Typology Code available for payment source

## 2013-04-26 VITALS — BP 130/62 | Temp 98.1°F | Wt 159.0 lb

## 2013-04-26 DIAGNOSIS — O9989 Other specified diseases and conditions complicating pregnancy, childbirth and the puerperium: Secondary | ICD-10-CM

## 2013-04-26 DIAGNOSIS — O360990 Maternal care for other rhesus isoimmunization, unspecified trimester, not applicable or unspecified: Secondary | ICD-10-CM

## 2013-04-26 DIAGNOSIS — J029 Acute pharyngitis, unspecified: Secondary | ICD-10-CM

## 2013-04-26 DIAGNOSIS — S0990XA Unspecified injury of head, initial encounter: Secondary | ICD-10-CM | POA: Insufficient documentation

## 2013-04-26 DIAGNOSIS — Z1389 Encounter for screening for other disorder: Secondary | ICD-10-CM

## 2013-04-26 DIAGNOSIS — IMO0002 Reserved for concepts with insufficient information to code with codable children: Secondary | ICD-10-CM | POA: Insufficient documentation

## 2013-04-26 DIAGNOSIS — S0993XA Unspecified injury of face, initial encounter: Secondary | ICD-10-CM | POA: Insufficient documentation

## 2013-04-26 DIAGNOSIS — Z3490 Encounter for supervision of normal pregnancy, unspecified, unspecified trimester: Secondary | ICD-10-CM

## 2013-04-26 DIAGNOSIS — Z8659 Personal history of other mental and behavioral disorders: Secondary | ICD-10-CM | POA: Insufficient documentation

## 2013-04-26 DIAGNOSIS — Z79899 Other long term (current) drug therapy: Secondary | ICD-10-CM | POA: Insufficient documentation

## 2013-04-26 DIAGNOSIS — Y9389 Activity, other specified: Secondary | ICD-10-CM | POA: Insufficient documentation

## 2013-04-26 DIAGNOSIS — Z87891 Personal history of nicotine dependence: Secondary | ICD-10-CM | POA: Insufficient documentation

## 2013-04-26 DIAGNOSIS — Y9239 Other specified sports and athletic area as the place of occurrence of the external cause: Secondary | ICD-10-CM | POA: Insufficient documentation

## 2013-04-26 DIAGNOSIS — Z87440 Personal history of urinary (tract) infections: Secondary | ICD-10-CM | POA: Insufficient documentation

## 2013-04-26 MED ORDER — LIDOCAINE HCL 2 % EX GEL: CUTANEOUS | Status: DC

## 2013-04-26 MED ORDER — DOXYLAMINE SUCCINATE (SLEEP) 25 MG PO TABS: 25.0000 mg | ORAL_TABLET | Freq: Every evening | ORAL | Status: DC | PRN

## 2013-04-26 MED ORDER — ZOLPIDEM TARTRATE 10 MG PO TABS: 5.0000 mg | ORAL_TABLET | Freq: Every evening | ORAL | Status: DC | PRN

## 2013-04-26 NOTE — ED Notes (Signed)
Fetal heart tone 148 at this time, strong and steady.  Pt c/o neck pain and lower back pain.

## 2013-04-26 NOTE — ED Notes (Signed)
Pt has c collar off.

## 2013-04-26 NOTE — ED Notes (Signed)
Pt had asked to have c collar removed.  Explained to pt the need for c collar and risks involved.  Pt verbalized understanding.  However, pt was sitting in waiting room with collar off.  Pt then put collar back on.
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
## 2013-04-26 NOTE — ED Notes (Signed)
Pt noted eating a hamburger family had brought to her, and c-collar off sitting on counter. Pt refused to put collar on at this time.
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
## 2013-04-26 NOTE — Progress Notes (Signed)
U/S(20+2wks)-active fetus, meas c/w dates, fluid wnl, no major abnl noted, ant gr 0 plac, cx long and closed, bilateral adnexa wnl, female fetus
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
## 2013-04-26 NOTE — Progress Notes (Addendum)
Had u/s today. Has had c/o SOB for several weeks, particularly when supine.  Went to ED 9/5:  Normal w/u there.  LCTAB today.  Able to talk long sentences/walk w/o SOB.  C/O sore throat for several days.  No asso sx.  White ulceration on uvula.  Culture taken and lidocaine jelly given.  Unisom OTC recommended for sleep.  Prefers [place]. Try OTC first. (addendum:  unisom costs $10, so #15 ambien mg prescribed)

## 2013-04-26 NOTE — Addendum Note (Signed)
Addended by: Jacklyn Shell on: 04/26/2013 02:23 PM   Modules accepted: Orders

## 2013-04-27 ENCOUNTER — Encounter: Payer: Self-pay | Admitting: Obstetrics & Gynecology

## 2013-04-27 ENCOUNTER — Ambulatory Visit (INDEPENDENT_AMBULATORY_CARE_PROVIDER_SITE_OTHER): Payer: Medicaid Other | Admitting: Obstetrics & Gynecology

## 2013-04-27 VITALS — BP 118/58 | Wt 158.5 lb

## 2013-04-27 DIAGNOSIS — Z1389 Encounter for screening for other disorder: Secondary | ICD-10-CM

## 2013-04-27 DIAGNOSIS — Z331 Pregnant state, incidental: Secondary | ICD-10-CM

## 2013-04-27 DIAGNOSIS — O360990 Maternal care for other rhesus isoimmunization, unspecified trimester, not applicable or unspecified: Secondary | ICD-10-CM

## 2013-04-27 DIAGNOSIS — O99340 Other mental disorders complicating pregnancy, unspecified trimester: Secondary | ICD-10-CM

## 2013-04-27 LAB — POCT URINALYSIS DIPSTICK
Blood, UA: NEGATIVE
Glucose, UA: NEGATIVE
Ketones, UA: NEGATIVE
Nitrite, UA: NEGATIVE
Protein, UA: NEGATIVE

## 2013-04-27 MED ORDER — RHO D IMMUNE GLOBULIN 1500 UNIT/2ML IJ SOLN
300.0000 ug | Freq: Once | INTRAMUSCULAR | Status: AC
  Administered 2013-04-27: 300 ug via INTRAMUSCULAR
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
## 2013-04-28 ENCOUNTER — Encounter: Payer: Medicaid Other | Admitting: Obstetrics & Gynecology

## 2013-04-28 LAB — CULTURE, GROUP A STREP: Organism ID, Bacteria: NORMAL

## 2013-05-04 ENCOUNTER — Telehealth: Payer: Self-pay | Admitting: *Deleted

## 2013-05-04 NOTE — Telephone Encounter (Signed)
Pt stated needed to update mobile # to [phone]

## 2013-05-18 ENCOUNTER — Encounter: Payer: Self-pay | Admitting: Obstetrics and Gynecology

## 2013-05-18 ENCOUNTER — Telehealth: Payer: Self-pay | Admitting: Obstetrics and Gynecology

## 2013-05-18 ENCOUNTER — Ambulatory Visit (INDEPENDENT_AMBULATORY_CARE_PROVIDER_SITE_OTHER): Payer: Medicaid Other | Admitting: Obstetrics and Gynecology

## 2013-05-18 VITALS — BP 112/68 | Wt 162.0 lb

## 2013-05-18 DIAGNOSIS — M62830 Muscle spasm of back: Secondary | ICD-10-CM

## 2013-05-18 DIAGNOSIS — O99340 Other mental disorders complicating pregnancy, unspecified trimester: Secondary | ICD-10-CM

## 2013-05-18 DIAGNOSIS — O360990 Maternal care for other rhesus isoimmunization, unspecified trimester, not applicable or unspecified: Secondary | ICD-10-CM

## 2013-05-18 DIAGNOSIS — Z331 Pregnant state, incidental: Secondary | ICD-10-CM

## 2013-05-18 DIAGNOSIS — Z1389 Encounter for screening for other disorder: Secondary | ICD-10-CM

## 2013-05-18 LAB — POCT URINALYSIS DIPSTICK
Glucose, UA: NEGATIVE
Ketones, UA: NEGATIVE
Leukocytes, UA: NEGATIVE
Nitrite, UA: NEGATIVE
Protein, UA: NEGATIVE

## 2013-05-18 MED ORDER — CYCLOBENZAPRINE HCL 10 MG PO TABS: 5.0000 mg | ORAL_TABLET | Freq: Three times a day (TID) | ORAL | Status: DC | PRN

## 2013-05-18 NOTE — Progress Notes (Signed)
Pt here today for pain in lower back that radiates to her sides. Tylenol doesn't help. Pt has had pian since last night.
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
## 2013-05-18 NOTE — Patient Instructions (Signed)
Try flexeril 5 mg in the evening for back spasm

## 2013-05-18 NOTE — Telephone Encounter (Signed)
Spoke with pt. Has had backpain since yesterday. Tylenol not helping. Also, having burning with urination since yesterday. Pt to be worked in at [facility] today. JSY

## 2013-05-18 NOTE — Telephone Encounter (Signed)
Pt calling to clarify RX for flexeril. Pt informed per Dr. Despina Hidden can take Flexeril 5 mg tablet TID or if concerned with drowsiness during the day can take Flexeril 5 mg tablet hs and see if that relieves the back pain and discomfort. Pt encouraged to call office with anymore questions or concerns.

## 2013-05-24 ENCOUNTER — Ambulatory Visit (INDEPENDENT_AMBULATORY_CARE_PROVIDER_SITE_OTHER): Payer: Medicaid Other | Admitting: Women's Health

## 2013-05-24 VITALS — BP 108/70 | Wt 165.8 lb

## 2013-05-24 DIAGNOSIS — Z3490 Encounter for supervision of normal pregnancy, unspecified, unspecified trimester: Secondary | ICD-10-CM

## 2013-05-24 DIAGNOSIS — Z3402 Encounter for supervision of normal first pregnancy, second trimester: Secondary | ICD-10-CM

## 2013-05-24 DIAGNOSIS — Z1389 Encounter for screening for other disorder: Secondary | ICD-10-CM

## 2013-05-24 DIAGNOSIS — Z331 Pregnant state, incidental: Secondary | ICD-10-CM

## 2013-05-24 DIAGNOSIS — O99340 Other mental disorders complicating pregnancy, unspecified trimester: Secondary | ICD-10-CM

## 2013-05-24 DIAGNOSIS — O360990 Maternal care for other rhesus isoimmunization, unspecified trimester, not applicable or unspecified: Secondary | ICD-10-CM

## 2013-05-24 LAB — POCT URINALYSIS DIPSTICK
Blood, UA: NEGATIVE
Glucose, UA: NEGATIVE
Ketones, UA: NEGATIVE
Leukocytes, UA: NEGATIVE
Nitrite, UA: NEGATIVE

## 2013-05-24 NOTE — Progress Notes (Signed)
No complaints at this time.

## 2013-05-24 NOTE — Progress Notes (Signed)
Reports good fm. Denies uc's, lof, vb, urinary frequency, urgency, hesitancy, or dysuria.  No complaints.  Reviewed ptl s/s, fm.  All questions answered. F/U in 4wks for pn2 and visit.

## 2013-05-24 NOTE — Patient Instructions (Signed)
You will have your sugar test next visit.  Please do not eat or drink anything after midnight the night before you come, not even water.  You will be here for at least two hours.    Pregnancy - Second Trimester The second trimester of pregnancy (3 to 6 months) is a period of rapid growth for you and your baby. At the end of the sixth month, your baby is about 9 inches long and weighs 1 1/2 pounds. You will begin to feel the baby move between 18 and 20 weeks of the pregnancy. This is called quickening. Weight gain is faster. A clear fluid (colostrum) may leak out of your breasts. You may feel small contractions of the womb (uterus). This is known as false labor or Braxton-Hicks contractions. This is like a practice for labor when the baby is ready to be born. Usually, the problems with morning sickness have usually passed by the end of your first trimester. Some women develop small dark blotches (called cholasma, mask of pregnancy) on their face that usually goes away after the baby is born. Exposure to the sun makes the blotches worse. Acne may also develop in some pregnant women and pregnant women who have acne, may find that it goes away. PRENATAL EXAMS  Blood work may continue to be done during prenatal exams. These tests are done to check on your health and the probable health of your baby. Blood work is used to follow your blood levels (hemoglobin). Anemia (low hemoglobin) is common during pregnancy. Iron and vitamins are given to help prevent this. You will also be checked for diabetes between 24 and 28 weeks of the pregnancy. Some of the previous blood tests may be repeated.  The size of the uterus is measured during each visit. This is to make sure that the baby is continuing to grow properly according to the dates of the pregnancy.  Your blood pressure is checked every prenatal visit. This is to make sure you are not getting toxemia.  Your urine is checked to make sure you do not have an  infection, diabetes or protein in the urine.  Your weight is checked often to make sure gains are happening at the suggested rate. This is to ensure that both you and your baby are growing normally.  Sometimes, an ultrasound is performed to confirm the proper growth and development of the baby. This is a test which bounces harmless sound waves off the baby so your caregiver can more accurately determine due dates. Sometimes, a test is done on the amniotic fluid surrounding the baby. This test is called an amniocentesis. The amniotic fluid is obtained by sticking a needle into the belly (abdomen). This is done to check the chromosomes in instances where there is a concern about possible genetic problems with the baby. It is also sometimes done near the end of pregnancy if an early delivery is required. In this case, it is done to help make sure the baby's lungs are mature enough for the baby to live outside of the womb. CHANGES OCCURING IN THE SECOND TRIMESTER OF PREGNANCY Your body goes through many changes during pregnancy. They vary from person to person. Talk to your caregiver about changes you notice that you are concerned about.  During the second trimester, you will likely have an increase in your appetite. It is normal to have cravings for certain foods. This varies from person to person and pregnancy to pregnancy.  Your lower abdomen will begin to   bulge.  You may have to urinate more often because the uterus and baby are pressing on your bladder. It is also common to get more bladder infections during pregnancy. You can help this by drinking lots of fluids and emptying your bladder before and after intercourse.  You may begin to get stretch marks on your hips, abdomen, and breasts. These are normal changes in the body during pregnancy. There are no exercises or medicines to take that prevent this change.  You may begin to develop swollen and bulging veins (varicose veins) in your legs.  Wearing support hose, elevating your feet for 15 minutes, 3 to 4 times a day and limiting salt in your diet helps lessen the problem.  Heartburn may develop as the uterus grows and pushes up against the stomach. Antacids recommended by your caregiver helps with this problem. Also, eating smaller meals 4 to 5 times a day helps.  Constipation can be treated with a stool softener or adding bulk to your diet. Drinking lots of fluids, and eating vegetables, fruits, and whole grains are helpful.  Exercising is also helpful. If you have been very active up until your pregnancy, most of these activities can be continued during your pregnancy. If you have been less active, it is helpful to start an exercise program such as walking.  Hemorrhoids may develop at the end of the second trimester. Warm sitz baths and hemorrhoid cream recommended by your caregiver helps hemorrhoid problems.  Backaches may develop during this time of your pregnancy. Avoid heavy lifting, wear low heal shoes, and practice good posture to help with backache problems.  Some pregnant women develop tingling and numbness of their hand and fingers because of swelling and tightening of ligaments in the wrist (carpel tunnel syndrome). This goes away after the baby is born.  As your breasts enlarge, you may have to get a bigger bra. Get a comfortable, cotton, support bra. Do not get a nursing bra until the last month of the pregnancy if you will be nursing the baby.  You may get a dark line from your belly button to the pubic area called the linea nigra.  You may develop rosy cheeks because of increase blood flow to the face.  You may develop spider looking lines of the face, neck, arms, and chest. These go away after the baby is born. HOME CARE INSTRUCTIONS   It is extremely important to avoid all smoking, herbs, alcohol, and unprescribed drugs during your pregnancy. These chemicals affect the formation and growth of the baby. Avoid  these chemicals throughout the pregnancy to ensure the delivery of a healthy infant.  Most of your home care instructions are the same as suggested for the first trimester of your pregnancy. Keep your caregiver's appointments. Follow your caregiver's instructions regarding medicine use, exercise, and diet.  During pregnancy, you are providing food for you and your baby. Continue to eat regular, well-balanced meals. Choose foods such as meat, fish, milk and other low fat dairy products, vegetables, fruits, and whole-grain breads and cereals. Your caregiver will tell you of the ideal weight gain.  A physical sexual relationship may be continued up until near the end of pregnancy if there are no other problems. Problems could include early (premature) leaking of amniotic fluid from the membranes, vaginal bleeding, abdominal pain, or other medical or pregnancy problems.  Exercise regularly if there are no restrictions. Check with your caregiver if you are unsure of the safety of some of your exercises. The   greatest weight gain will occur in the last 2 trimesters of pregnancy. Exercise will help you:  Control your weight.  Get you in shape for labor and delivery.  Lose weight after you have the baby.  Wear a good support or jogging bra for breast tenderness during pregnancy. This may help if worn during sleep. Pads or tissues may be used in the bra if you are leaking colostrum.  Do not use hot tubs, steam rooms or saunas throughout the pregnancy.  Wear your seat belt at all times when driving. This protects you and your baby if you are in an accident.  Avoid raw meat, uncooked cheese, cat litter boxes, and soil used by cats. These carry germs that can cause birth defects in the baby.  The second trimester is also a good time to visit your dentist for your dental health if this has not been done yet. Getting your teeth cleaned is okay. Use a soft toothbrush. Brush gently during pregnancy.  It is  easier to leak urine during pregnancy. Tightening up and strengthening the pelvic muscles will help with this problem. Practice stopping your urination while you are going to the bathroom. These are the same muscles you need to strengthen. It is also the muscles you would use as if you were trying to stop from passing gas. You can practice tightening these muscles up 10 times a set and repeating this about 3 times per day. Once you know what muscles to tighten up, do not perform these exercises during urination. It is more likely to contribute to an infection by backing up the urine.  Ask for help if you have financial, counseling, or nutritional needs during pregnancy. Your caregiver will be able to offer counseling for these needs as well as refer you for other special needs.  Your skin may become oily. If so, wash your face with mild soap, use non-greasy moisturizer and oil or cream based makeup. MEDICINES AND DRUG USE IN PREGNANCY  Take prenatal vitamins as directed. The vitamin should contain 1 milligram of folic acid. Keep all vitamins out of reach of children. Only a couple vitamins or tablets containing iron may be fatal to a baby or young child when ingested.  Avoid use of all medicines, including herbs, over-the-counter medicines, not prescribed or suggested by your caregiver. Only take over-the-counter or prescription medicines for pain, discomfort, or fever as directed by your caregiver. Do not use aspirin.  Let your caregiver also know about herbs you may be using.  Alcohol is related to a number of birth defects. This includes fetal alcohol syndrome. All alcohol, in any form, should be avoided completely. Smoking will cause low birth rate and premature babies.  Street or illegal drugs are very harmful to the baby. They are absolutely forbidden. A baby born to an addicted mother will be addicted at birth. The baby will go through the same withdrawal an adult does. SEEK MEDICAL CARE IF:    You have any concerns or worries during your pregnancy. It is better to call with your questions if you feel they cannot wait, rather than worry about them. SEEK IMMEDIATE MEDICAL CARE IF:   An unexplained oral temperature above 102 F (38.9 C) develops, or as your caregiver suggests.  You have leaking of fluid from the vagina (birth canal). If leaking membranes are suspected, take your temperature and tell your caregiver of this when you call.  There is vaginal spotting, bleeding, or passing clots. Tell your caregiver of   the amount and how many pads are used. Light spotting in pregnancy is common, especially following intercourse.  You develop a bad smelling vaginal discharge with a change in the color from clear to white.  You continue to feel sick to your stomach (nauseated) and have no relief from remedies suggested. You vomit blood or coffee ground-like materials.  You lose more than 2 pounds of weight or gain more than 2 pounds of weight over 1 week, or as suggested by your caregiver.  You notice swelling of your face, hands, feet, or legs.  You get exposed to German measles and have never had them.  You are exposed to fifth disease or chickenpox.  You develop belly (abdominal) pain. Round ligament discomfort is a common non-cancerous (benign) cause of abdominal pain in pregnancy. Your caregiver still must evaluate you.  You develop a bad headache that does not go away.  You develop fever, diarrhea, pain with urination, or shortness of breath.  You develop visual problems, blurry, or double vision.  You fall or are in a car accident or any kind of trauma.  There is mental or physical violence at home. Document Released: 07/22/2001 Document Revised: 04/21/2012 Document Reviewed: 01/24/2009 [employer] Patient Information 2014 [employer].  

## 2013-05-25 ENCOUNTER — Telehealth: Payer: Self-pay | Admitting: *Deleted

## 2013-05-26 ENCOUNTER — Encounter: Payer: Self-pay | Admitting: Obstetrics & Gynecology

## 2013-05-26 NOTE — Progress Notes (Signed)
Patient ID: Jessica [name], female   DOB: [birth year], 22 y.o.   MRN: [id number] Pt called requesting pain meds for 4 fillings done at dentist advised pt to go back to dentist. Spoke with Dr. Despina Hidden and he denied pain meds at this time/amp

## 2013-05-30 ENCOUNTER — Telehealth: Payer: Self-pay | Admitting: Obstetrics & Gynecology

## 2013-05-30 NOTE — Telephone Encounter (Signed)
Left message x 1. JSY 

## 2013-05-30 NOTE — Telephone Encounter (Signed)
Spoke with pt. Was given Hydrocodone 5/325 for tooth pain by dentist. Pt wants to make sure it's safe to take during pregnancy. Spoke with Joellyn Haff, CNM. Med is safe to take during pregnancy. JSY

## 2013-06-06 ENCOUNTER — Ambulatory Visit (INDEPENDENT_AMBULATORY_CARE_PROVIDER_SITE_OTHER): Payer: Medicaid Other | Admitting: Obstetrics & Gynecology

## 2013-06-06 VITALS — BP 120/60 | Wt 169.0 lb

## 2013-06-06 DIAGNOSIS — Z331 Pregnant state, incidental: Secondary | ICD-10-CM

## 2013-06-06 DIAGNOSIS — O9989 Other specified diseases and conditions complicating pregnancy, childbirth and the puerperium: Secondary | ICD-10-CM

## 2013-06-06 DIAGNOSIS — Z3402 Encounter for supervision of normal first pregnancy, second trimester: Secondary | ICD-10-CM

## 2013-06-06 DIAGNOSIS — Z1389 Encounter for screening for other disorder: Secondary | ICD-10-CM

## 2013-06-06 LAB — POCT URINALYSIS DIPSTICK
Blood, UA: NEGATIVE
Glucose, UA: NEGATIVE
Ketones, UA: NEGATIVE
Leukocytes, UA: NEGATIVE
Nitrite, UA: NEGATIVE
Protein, UA: NEGATIVE

## 2013-06-06 NOTE — Telephone Encounter (Signed)
Left message for pt to call our office back as needed.

## 2013-06-06 NOTE — Progress Notes (Signed)
Pt having roung ligament pain no bleeding BP weight and urine results all reviewed and noted. Patient reports good fetal movement, denies any bleeding and no rupture of membranes symptoms or regular contractions. Patient is without complaints. All questions were answered.

## 2013-06-06 NOTE — Progress Notes (Signed)
WI for lower abdominal pressure and pain, urinary urgency, lower back pain.

## 2013-06-07 ENCOUNTER — Telehealth: Payer: Self-pay | Admitting: Obstetrics and Gynecology

## 2013-06-07 NOTE — Telephone Encounter (Signed)
Spoke with pt. Sorethroat x 3-4 days. No fever. Notices white patches on throat. Throat a little swollen, but not having trouble breathing. Advised if worse, go to nearest ER. Call transferred to front desk for appt. JSY

## 2013-06-08 ENCOUNTER — Encounter: Payer: Self-pay | Admitting: Advanced Practice Midwife

## 2013-06-08 ENCOUNTER — Ambulatory Visit (INDEPENDENT_AMBULATORY_CARE_PROVIDER_SITE_OTHER): Payer: Medicaid Other | Admitting: Advanced Practice Midwife

## 2013-06-08 VITALS — BP 100/60 | Wt 169.0 lb

## 2013-06-08 DIAGNOSIS — Z331 Pregnant state, incidental: Secondary | ICD-10-CM

## 2013-06-08 DIAGNOSIS — J029 Acute pharyngitis, unspecified: Secondary | ICD-10-CM

## 2013-06-08 DIAGNOSIS — Z1389 Encounter for screening for other disorder: Secondary | ICD-10-CM

## 2013-06-08 DIAGNOSIS — O9989 Other specified diseases and conditions complicating pregnancy, childbirth and the puerperium: Secondary | ICD-10-CM

## 2013-06-08 LAB — POCT URINALYSIS DIPSTICK
Blood, UA: NEGATIVE
Glucose, UA: NEGATIVE
Ketones, UA: NEGATIVE
Leukocytes, UA: NEGATIVE
Nitrite, UA: NEGATIVE
Protein, UA: NEGATIVE

## 2013-06-08 MED ORDER — LIDOCAINE VISCOUS 2 % MT SOLN: 5.0000 mL | OROMUCOSAL | Status: DC | PRN

## 2013-06-08 NOTE — Progress Notes (Addendum)
Present with complaints of a sore throat for the last few days. Denies fever, body aches, chills, cough and runny nose. Throat slightly red, mild erythema. Strep culture collected. Encouraged to gargle with salt water. Rx for vicous lidocaine sent to Pharmacy. Call Friday to see if antibiotics are needed. Follow up at next scheduled appointment 11/11.

## 2013-06-08 NOTE — Progress Notes (Signed)
Pt being seen for sore throat. Pt states she has had a sore throat for 1 week. Pt states that she is on Antibiotics for tooth but couldn't remember the name.

## 2013-06-09 ENCOUNTER — Telehealth: Payer: Self-pay

## 2013-06-09 LAB — STREP A DNA PROBE: GASP: NEGATIVE

## 2013-06-09 NOTE — Telephone Encounter (Signed)
Spoke with pt. Advised throat culture was negative. Pt asked if she could travel out of state for Thanksgiving. Advised to discuss at next appt. Pt voiced understanding. JSY

## 2013-06-09 NOTE — Telephone Encounter (Signed)
Left message x 1. JSY 

## 2013-06-21 ENCOUNTER — Ambulatory Visit (INDEPENDENT_AMBULATORY_CARE_PROVIDER_SITE_OTHER): Payer: Medicaid Other | Admitting: Adult Health

## 2013-06-21 ENCOUNTER — Other Ambulatory Visit (INDEPENDENT_AMBULATORY_CARE_PROVIDER_SITE_OTHER): Payer: Medicaid Other

## 2013-06-21 ENCOUNTER — Encounter: Payer: Self-pay | Admitting: Adult Health

## 2013-06-21 VITALS — BP 118/60 | Wt 174.2 lb

## 2013-06-21 DIAGNOSIS — Z331 Pregnant state, incidental: Secondary | ICD-10-CM

## 2013-06-21 DIAGNOSIS — Z3400 Encounter for supervision of normal first pregnancy, unspecified trimester: Secondary | ICD-10-CM

## 2013-06-21 DIAGNOSIS — O99340 Other mental disorders complicating pregnancy, unspecified trimester: Secondary | ICD-10-CM

## 2013-06-21 DIAGNOSIS — O99019 Anemia complicating pregnancy, unspecified trimester: Secondary | ICD-10-CM

## 2013-06-21 DIAGNOSIS — Z3402 Encounter for supervision of normal first pregnancy, second trimester: Secondary | ICD-10-CM

## 2013-06-21 DIAGNOSIS — O360990 Maternal care for other rhesus isoimmunization, unspecified trimester, not applicable or unspecified: Secondary | ICD-10-CM

## 2013-06-21 DIAGNOSIS — Z1389 Encounter for screening for other disorder: Secondary | ICD-10-CM

## 2013-06-21 LAB — HIV ANTIBODY (ROUTINE TESTING W REFLEX): HIV: NONREACTIVE

## 2013-06-21 LAB — POCT URINALYSIS DIPSTICK
Blood, UA: NEGATIVE
Glucose, UA: NEGATIVE
Ketones, UA: NEGATIVE
Nitrite, UA: NEGATIVE
Protein, UA: NEGATIVE

## 2013-06-21 LAB — CBC
HCT: 33.4 % — ABNORMAL LOW (ref 36.0–46.0)
Hemoglobin: 11.4 g/dL — ABNORMAL LOW (ref 12.0–15.0)
MCH: 28.1 pg (ref 26.0–34.0)
MCHC: 34.1 g/dL (ref 30.0–36.0)
MCV: 82.5 fL (ref 78.0–100.0)
Platelets: 257 10*3/uL (ref 150–400)
RBC: 4.05 MIL/uL (ref 3.87–5.11)
RDW: 14.7 % (ref 11.5–15.5)
WBC: 9.3 10*3/uL (ref 4.0–10.5)

## 2013-06-21 LAB — RPR

## 2013-06-21 NOTE — Patient Instructions (Signed)
Third Trimester of Pregnancy The third trimester is from week 29 through week 42, months 7 through 9. The third trimester is a time when the fetus is growing rapidly. At the end of the ninth month, the fetus is about 20 inches in length and weighs 6 10 pounds.  BODY CHANGES Your body goes through many changes during pregnancy. The changes vary from woman to woman.   Your weight will continue to increase. You can expect to gain 25 35 pounds (11 16 kg) by the end of the pregnancy.  You may begin to get stretch marks on your hips, abdomen, and breasts.  You may urinate more often because the fetus is moving lower into your pelvis and pressing on your bladder.  You may develop or continue to have heartburn as a result of your pregnancy.  You may develop constipation because certain hormones are causing the muscles that push waste through your intestines to slow down.  You may develop hemorrhoids or swollen, bulging veins (varicose veins).  You may have pelvic pain because of the weight gain and pregnancy hormones relaxing your joints between the bones in your pelvis. Back aches may result from over exertion of the muscles supporting your posture.  Your breasts will continue to grow and be tender. A yellow discharge may leak from your breasts called colostrum.  Your belly button may stick out.  You may feel short of breath because of your expanding uterus.  You may notice the fetus "dropping," or moving lower in your abdomen.  You may have a bloody mucus discharge. This usually occurs a few days to a week before labor begins.  Your cervix becomes thin and soft (effaced) near your due date. WHAT TO EXPECT AT YOUR PRENATAL EXAMS  You will have prenatal exams every 2 weeks until week 36. Then, you will have weekly prenatal exams. During a routine prenatal visit:  You will be weighed to make sure you and the fetus are growing normally.  Your blood pressure is taken.  Your abdomen will be  measured to track your baby's growth.  The fetal heartbeat will be listened to.  Any test results from the previous visit will be discussed.  You may have a cervical check near your due date to see if you have effaced. At around 36 weeks, your caregiver will check your cervix. At the same time, your caregiver will also perform a test on the secretions of the vaginal tissue. This test is to determine if a type of bacteria, Group B streptococcus, is present. Your caregiver will explain this further. Your caregiver may ask you:  What your birth plan is.  How you are feeling.  If you are feeling the baby move.  If you have had any abnormal symptoms, such as leaking fluid, bleeding, severe headaches, or abdominal cramping.  If you have any questions. Other tests or screenings that may be performed during your third trimester include:  Blood tests that check for low iron levels (anemia).  Fetal testing to check the health, activity level, and growth of the fetus. Testing is done if you have certain medical conditions or if there are problems during the pregnancy. FALSE LABOR You may feel small, irregular contractions that eventually go away. These are called Braxton Hicks contractions, or false labor. Contractions may last for hours, days, or even weeks before true labor sets in. If contractions come at regular intervals, intensify, or become painful, it is best to be seen by your caregiver.  SIGNS OF LABOR   Menstrual-like cramps.  Contractions that are 5 minutes apart or less.  Contractions that start on the top of the uterus and spread down to the lower abdomen and back.  A sense of increased pelvic pressure or back pain.  A watery or bloody mucus discharge that comes from the vagina. If you have any of these signs before the 37th week of pregnancy, call your caregiver right away. You need to go to the hospital to get checked immediately. HOME CARE INSTRUCTIONS   Avoid all  smoking, herbs, alcohol, and unprescribed drugs. These chemicals affect the formation and growth of the baby.  Follow your caregiver's instructions regarding medicine use. There are medicines that are either safe or unsafe to take during pregnancy.  Exercise only as directed by your caregiver. Experiencing uterine cramps is a good sign to stop exercising.  Continue to eat regular, healthy meals.  Wear a good support bra for breast tenderness.  Do not use hot tubs, steam rooms, or saunas.  Wear your seat belt at all times when driving.  Avoid raw meat, uncooked cheese, cat litter boxes, and soil used by cats. These carry germs that can cause birth defects in the baby.  Take your prenatal vitamins.  Try taking a stool softener (if your caregiver approves) if you develop constipation. Eat more high-fiber foods, such as fresh vegetables or fruit and whole grains. Drink plenty of fluids to keep your urine clear or pale yellow.  Take warm sitz baths to soothe any pain or discomfort caused by hemorrhoids. Use hemorrhoid cream if your caregiver approves.  If you develop varicose veins, wear support hose. Elevate your feet for 15 minutes, 3 4 times a day. Limit salt in your diet.  Avoid heavy lifting, wear low heal shoes, and practice good posture.  Rest a lot with your legs elevated if you have leg cramps or low back pain.  Visit your dentist if you have not gone during your pregnancy. Use a soft toothbrush to brush your teeth and be gentle when you floss.  A sexual relationship may be continued unless your caregiver directs you otherwise.  Do not travel far distances unless it is absolutely necessary and only with the approval of your caregiver.  Take prenatal classes to understand, practice, and ask questions about the labor and delivery.  Make a trial run to the hospital.  Pack your hospital bag.  Prepare the baby's nursery.  Continue to go to all your prenatal visits as directed  by your caregiver. SEEK MEDICAL CARE IF:  You are unsure if you are in labor or if your water has broken.  You have dizziness.  You have mild pelvic cramps, pelvic pressure, or nagging pain in your abdominal area.  You have persistent nausea, vomiting, or diarrhea.  You have a bad smelling vaginal discharge.  You have pain with urination. SEEK IMMEDIATE MEDICAL CARE IF:   You have a fever.  You are leaking fluid from your vagina.  You have spotting or bleeding from your vagina.  You have severe abdominal cramping or pain.  You have rapid weight loss or gain.  You have shortness of breath with chest pain.  You notice sudden or extreme swelling of your face, hands, ankles, feet, or legs.  You have not felt your baby move in over an hour.  You have severe headaches that do not go away with medicine.  You have vision changes. Document Released: 07/22/2001 Document Revised: 03/30/2013 Document Reviewed:   09/28/2012 [employer] Patient Information 2014 [place], [place]. Follow up in 1 week for rhopylac

## 2013-06-21 NOTE — Progress Notes (Signed)
No complaints has good fetal movement,PN2 today, return in 1 week for rhophylac and see [name] before going to [place]

## 2013-06-22 LAB — GLUCOSE TOLERANCE, 2 HOURS W/ 1HR
Glucose, 1 hour: 153 mg/dL (ref 70–170)
Glucose, 2 hour: 86 mg/dL (ref 70–139)
Glucose, Fasting: 85 mg/dL (ref 70–99)

## 2013-06-22 LAB — ANTIBODY TITER (PRENATAL TITER): Ab Titer: <2

## 2013-06-22 LAB — HSV 2 ANTIBODY, IGG: HSV 2 Glycoprotein G Ab, IgG: <0.1 IV

## 2013-06-22 LAB — ANTIBODY SCREEN: Antibody Screen: POSITIVE — AB

## 2013-06-22 LAB — PRENATAL ANTIBODY IDENTIFICATION

## 2013-06-27 ENCOUNTER — Telehealth: Payer: Self-pay | Admitting: *Deleted

## 2013-06-27 NOTE — Telephone Encounter (Signed)
Pt informed of WNL glucose results from 06/21/2013.

## 2013-06-28 ENCOUNTER — Ambulatory Visit (INDEPENDENT_AMBULATORY_CARE_PROVIDER_SITE_OTHER): Payer: Medicaid Other | Admitting: Women's Health

## 2013-06-28 ENCOUNTER — Encounter: Payer: Self-pay | Admitting: Women's Health

## 2013-06-28 VITALS — BP 110/66 | Wt 174.2 lb

## 2013-06-28 DIAGNOSIS — Z6791 Unspecified blood type, Rh negative: Secondary | ICD-10-CM | POA: Insufficient documentation

## 2013-06-28 DIAGNOSIS — Z331 Pregnant state, incidental: Secondary | ICD-10-CM

## 2013-06-28 DIAGNOSIS — Z3490 Encounter for supervision of normal pregnancy, unspecified, unspecified trimester: Secondary | ICD-10-CM

## 2013-06-28 DIAGNOSIS — O360990 Maternal care for other rhesus isoimmunization, unspecified trimester, not applicable or unspecified: Secondary | ICD-10-CM

## 2013-06-28 DIAGNOSIS — O99019 Anemia complicating pregnancy, unspecified trimester: Secondary | ICD-10-CM

## 2013-06-28 DIAGNOSIS — O360131 Maternal care for anti-D [Rh] antibodies, third trimester, fetus 1: Secondary | ICD-10-CM

## 2013-06-28 DIAGNOSIS — Z1389 Encounter for screening for other disorder: Secondary | ICD-10-CM

## 2013-06-28 DIAGNOSIS — Z3403 Encounter for supervision of normal first pregnancy, third trimester: Secondary | ICD-10-CM

## 2013-06-28 DIAGNOSIS — O26899 Other specified pregnancy related conditions, unspecified trimester: Secondary | ICD-10-CM | POA: Insufficient documentation

## 2013-06-28 DIAGNOSIS — O99340 Other mental disorders complicating pregnancy, unspecified trimester: Secondary | ICD-10-CM

## 2013-06-28 LAB — POCT URINALYSIS DIPSTICK
Blood, UA: NEGATIVE
Glucose, UA: NEGATIVE
Ketones, UA: NEGATIVE
Leukocytes, UA: NEGATIVE
Nitrite, UA: NEGATIVE
Protein, UA: NEGATIVE

## 2013-06-28 MED ORDER — RHO D IMMUNE GLOBULIN 1500 UNIT/2ML IJ SOLN
300.0000 ug | Freq: Once | INTRAMUSCULAR | Status: AC
  Administered 2013-06-28: 300 ug via INTRAMUSCULAR

## 2013-06-28 NOTE — Patient Instructions (Signed)
[facility] [phone]          [facility] [phone]               [facility] [phone]               [facility] [phone] 3rd Ileene Patrick) [phone]  Third Trimester of Pregnancy The third trimester is from week 29 through week 42, months 7 through 9. The third trimester is a time when the fetus is growing rapidly. At the end of the ninth month, the fetus is about 20 inches in length and weighs 6 10 pounds.  BODY CHANGES Your body goes through many changes during pregnancy. The changes vary from woman to woman.   Your weight will continue to increase. You can expect to gain 25 35 pounds (11 16 kg) by the end of the pregnancy.  You may begin to get stretch marks on your hips, abdomen, and breasts.  You may urinate more often because the fetus is moving lower into your pelvis and pressing on your bladder.  You may develop or continue to have heartburn as a result of your pregnancy.  You may develop constipation because certain hormones are causing the muscles that push waste through your intestines to slow down.  You may develop hemorrhoids or swollen, bulging veins (varicose veins).  You may have pelvic pain because of the weight gain and pregnancy hormones relaxing your joints between the bones in your pelvis. Back aches may result from over exertion of the muscles supporting your posture.  Your breasts will continue to grow and be tender. A yellow discharge may leak from your breasts called colostrum.  Your belly button may stick out.  You may feel short of breath because of your expanding uterus.  You may notice the fetus "dropping," or moving lower in your abdomen.  You may have a bloody mucus discharge. This usually occurs a few days to a week before labor begins.  Your cervix becomes thin and soft (effaced) near your due date. WHAT TO EXPECT AT YOUR PRENATAL EXAMS  You will have prenatal  exams every 2 weeks until week 36. Then, you will have weekly prenatal exams. During a routine prenatal visit:  You will be weighed to make sure you and the fetus are growing normally.  Your blood pressure is taken.  Your abdomen will be measured to track your baby's growth.  The fetal heartbeat will be listened to.  Any test results from the previous visit will be discussed.  You may have a cervical check near your due date to see if you have effaced. At around 36 weeks, your caregiver will check your cervix. At the same time, your caregiver will also perform a test on the secretions of the vaginal tissue. This test is to determine if a type of bacteria, Group B streptococcus, is present. Your caregiver will explain this further. Your caregiver may ask you:  What your birth plan is.  How you are feeling.  If you are feeling the baby move.  If you have had any abnormal symptoms, such as leaking fluid, bleeding, severe headaches, or abdominal cramping.  If you have any questions. Other tests or screenings that may be performed during your third trimester include:  Blood tests that check for low iron levels (anemia).  Fetal testing to check the health, activity level, and growth of the fetus. Testing is done if you have certain medical conditions or  if there are problems during the pregnancy. FALSE LABOR You may feel small, irregular contractions that eventually go away. These are called Braxton Hicks contractions, or false labor. Contractions may last for hours, days, or even weeks before true labor sets in. If contractions come at regular intervals, intensify, or become painful, it is best to be seen by your caregiver.  SIGNS OF LABOR   Menstrual-like cramps.  Contractions that are 5 minutes apart or less.  Contractions that start on the top of the uterus and spread down to the lower abdomen and back.  A sense of increased pelvic pressure or back pain.  A watery or bloody  mucus discharge that comes from the vagina. If you have any of these signs before the 37th week of pregnancy, call your caregiver right away. You need to go to the hospital to get checked immediately. HOME CARE INSTRUCTIONS   Avoid all smoking, herbs, alcohol, and unprescribed drugs. These chemicals affect the formation and growth of the baby.  Follow your caregiver's instructions regarding medicine use. There are medicines that are either safe or unsafe to take during pregnancy.  Exercise only as directed by your caregiver. Experiencing uterine cramps is a good sign to stop exercising.  Continue to eat regular, healthy meals.  Wear a good support bra for breast tenderness.  Do not use hot tubs, steam rooms, or saunas.  Wear your seat belt at all times when driving.  Avoid raw meat, uncooked cheese, cat litter boxes, and soil used by cats. These carry germs that can cause birth defects in the baby.  Take your prenatal vitamins.  Try taking a stool softener (if your caregiver approves) if you develop constipation. Eat more high-fiber foods, such as fresh vegetables or fruit and whole grains. Drink plenty of fluids to keep your urine clear or pale yellow.  Take warm sitz baths to soothe any pain or discomfort caused by hemorrhoids. Use hemorrhoid cream if your caregiver approves.  If you develop varicose veins, wear support hose. Elevate your feet for 15 minutes, 3 4 times a day. Limit salt in your diet.  Avoid heavy lifting, wear low heal shoes, and practice good posture.  Rest a lot with your legs elevated if you have leg cramps or low back pain.  Visit your dentist if you have not gone during your pregnancy. Use a soft toothbrush to brush your teeth and be gentle when you floss.  A sexual relationship may be continued unless your caregiver directs you otherwise.  Do not travel far distances unless it is absolutely necessary and only with the approval of your caregiver.  Take  prenatal classes to understand, practice, and ask questions about the labor and delivery.  Make a trial run to the hospital.  Pack your hospital bag.  Prepare the baby's nursery.  Continue to go to all your prenatal visits as directed by your caregiver. SEEK MEDICAL CARE IF:  You are unsure if you are in labor or if your water has broken.  You have dizziness.  You have mild pelvic cramps, pelvic pressure, or nagging pain in your abdominal area.  You have persistent nausea, vomiting, or diarrhea.  You have a bad smelling vaginal discharge.  You have pain with urination. SEEK IMMEDIATE MEDICAL CARE IF:   You have a fever.  You are leaking fluid from your vagina.  You have spotting or bleeding from your vagina.  You have severe abdominal cramping or pain.  You have rapid weight loss or  gain.  You have shortness of breath with chest pain.  You notice sudden or extreme swelling of your face, hands, ankles, feet, or legs.  You have not felt your baby move in over an hour.  You have severe headaches that do not go away with medicine.  You have vision changes. Document Released: 07/22/2001 Document Revised: 03/30/2013 Document Reviewed: 09/28/2012 [facility] Patient Information 2014 [place], [place].

## 2013-06-28 NOTE — Progress Notes (Signed)
Reports good fm. Denies uc's, lof, vb, urinary frequency, urgency, hesitancy, or dysuria.  No complaints.  Encouraged CB classes, info given. Discussed contraception- reviewed all options w/ BF specifically LARCS, as well as need for taking POPs at exact same time daily to prevent pregnancy and difficulty of doing this w/ newborn. Pt adamant she wants to stick w/ POPs. Reviewed pn2 results, ptl s/s, fkc.  Rhogam today. All questions answered. F/U in 3wks for visit.

## 2013-06-29 ENCOUNTER — Other Ambulatory Visit: Payer: Self-pay | Admitting: *Deleted

## 2013-06-30 ENCOUNTER — Ambulatory Visit (INDEPENDENT_AMBULATORY_CARE_PROVIDER_SITE_OTHER): Payer: Medicaid Other | Admitting: Obstetrics and Gynecology

## 2013-06-30 ENCOUNTER — Encounter: Payer: Self-pay | Admitting: Obstetrics and Gynecology

## 2013-06-30 ENCOUNTER — Telehealth: Payer: Self-pay | Admitting: Obstetrics and Gynecology

## 2013-06-30 VITALS — BP 100/60 | Wt 175.0 lb

## 2013-06-30 DIAGNOSIS — O360990 Maternal care for other rhesus isoimmunization, unspecified trimester, not applicable or unspecified: Secondary | ICD-10-CM

## 2013-06-30 DIAGNOSIS — Z331 Pregnant state, incidental: Secondary | ICD-10-CM

## 2013-06-30 DIAGNOSIS — O99019 Anemia complicating pregnancy, unspecified trimester: Secondary | ICD-10-CM

## 2013-06-30 DIAGNOSIS — O99340 Other mental disorders complicating pregnancy, unspecified trimester: Secondary | ICD-10-CM

## 2013-06-30 DIAGNOSIS — O368190 Decreased fetal movements, unspecified trimester, not applicable or unspecified: Secondary | ICD-10-CM

## 2013-06-30 DIAGNOSIS — Z1389 Encounter for screening for other disorder: Secondary | ICD-10-CM

## 2013-06-30 DIAGNOSIS — O368131 Decreased fetal movements, third trimester, fetus 1: Secondary | ICD-10-CM

## 2013-06-30 LAB — POCT URINALYSIS DIPSTICK
Blood, UA: NEGATIVE
Glucose, UA: NEGATIVE
Ketones, UA: NEGATIVE
Leukocytes, UA: NEGATIVE
Nitrite, UA: NEGATIVE

## 2013-06-30 NOTE — Telephone Encounter (Signed)
Pt states feeling the baby move but not as much, "It worries me," no vaginal bleeding, pt seen in office on 06/28/2013. Explained to pt to eat and drink something and do kick count, if baby moves 10 times in 2 hours was a good sign, if not pt to call our office back or go to [facility] to be evaluated. Pt verbalized understanding.

## 2013-06-30 NOTE — Telephone Encounter (Signed)
Called pt back, per Cyril Mourning, NP, pt to come in now to be evaluated for decrease FM.

## 2013-07-01 NOTE — Progress Notes (Signed)
NST for decreased fetal mvmt done, promptly reactive. Kick cts reviewed, form given

## 2013-07-12 ENCOUNTER — Telehealth: Payer: Self-pay | Admitting: Obstetrics and Gynecology

## 2013-07-12 NOTE — Telephone Encounter (Signed)
Spoke with pt. Having low stomach pain x 2 hours. Not coming and going, it's continous. No bleeding. + baby movement. No leaking fluid. + pressure. Advised to increase fluids, and lay down. Pt was offered an appt for tomorrow, but wanted to wait at this point. Advised if she got worse during the night, go to the ER, or she can call tomorrow if still having problems. Advised to try Tylenol for discomfort. Pt voiced understanding. JSY

## 2013-07-13 ENCOUNTER — Telehealth: Payer: Self-pay | Admitting: *Deleted

## 2013-07-13 ENCOUNTER — Telehealth: Payer: Self-pay | Admitting: Obstetrics & Gynecology

## 2013-07-13 NOTE — Telephone Encounter (Signed)
Pt c/o cough and congestion, "thinks had a fever last night but not now." pt states taking Claritin and tylenol. Pt encouraged to push fluids, OTC plain Robitussin continue Tylenol as needed for fever.  Pt to call office back if no improvement. Pt verbalized understanding.

## 2013-07-13 NOTE — Telephone Encounter (Signed)
Pt states continues to have cough, congestion, but now having ear ache. Unable to get OTC robitussin for cough due to cost. Appt made for tomorrow to be evaluated.

## 2013-07-14 ENCOUNTER — Encounter: Payer: Self-pay | Admitting: Advanced Practice Midwife

## 2013-07-14 ENCOUNTER — Encounter: Payer: Medicaid Other | Admitting: Advanced Practice Midwife

## 2013-07-14 ENCOUNTER — Ambulatory Visit (INDEPENDENT_AMBULATORY_CARE_PROVIDER_SITE_OTHER): Payer: Medicaid Other | Admitting: Advanced Practice Midwife

## 2013-07-14 VITALS — BP 130/60 | Temp 98.0°F | Wt 178.0 lb

## 2013-07-14 DIAGNOSIS — O99340 Other mental disorders complicating pregnancy, unspecified trimester: Secondary | ICD-10-CM

## 2013-07-14 DIAGNOSIS — Z1389 Encounter for screening for other disorder: Secondary | ICD-10-CM

## 2013-07-14 DIAGNOSIS — Z331 Pregnant state, incidental: Secondary | ICD-10-CM

## 2013-07-14 DIAGNOSIS — O99019 Anemia complicating pregnancy, unspecified trimester: Secondary | ICD-10-CM

## 2013-07-14 LAB — POCT URINALYSIS DIPSTICK
Blood, UA: NEGATIVE
Glucose, UA: NEGATIVE
Ketones, UA: NEGATIVE
Leukocytes, UA: NEGATIVE
Nitrite, UA: NEGATIVE
Protein, UA: NEGATIVE

## 2013-07-14 MED ORDER — CETIRIZINE HCL 10 MG PO TABS: 10.0000 mg | ORAL_TABLET | Freq: Every day | ORAL | Status: DC

## 2013-07-14 MED ORDER — GUAIFENESIN 100 MG/5ML PO SOLN: 5.0000 mL | ORAL | Status: DC | PRN

## 2013-07-14 NOTE — Progress Notes (Signed)
Worked in for complaints of head congestion, sore throat, dry cough and headache. Bilateral ears clear of drainage. TM pearly grey. Throat pink without exudate. Lungs CTA. Recommended olive oil to be used as ear drops,  Robitussin and Zyrtec (allergry relief) to help with symptoms. Rx for Robitussin sent to the Pharmacy. Stay hydrated and plenty of rest. F/u at neck scheduled appt 12/9.

## 2013-07-19 ENCOUNTER — Ambulatory Visit (INDEPENDENT_AMBULATORY_CARE_PROVIDER_SITE_OTHER): Payer: Medicaid Other | Admitting: Women's Health

## 2013-07-19 ENCOUNTER — Encounter: Payer: Self-pay | Admitting: Women's Health

## 2013-07-19 VITALS — BP 120/72 | Wt 178.8 lb

## 2013-07-19 DIAGNOSIS — R82998 Other abnormal findings in urine: Secondary | ICD-10-CM

## 2013-07-19 DIAGNOSIS — Z331 Pregnant state, incidental: Secondary | ICD-10-CM

## 2013-07-19 DIAGNOSIS — Z1389 Encounter for screening for other disorder: Secondary | ICD-10-CM

## 2013-07-19 DIAGNOSIS — O99340 Other mental disorders complicating pregnancy, unspecified trimester: Secondary | ICD-10-CM

## 2013-07-19 DIAGNOSIS — Z3403 Encounter for supervision of normal first pregnancy, third trimester: Secondary | ICD-10-CM

## 2013-07-19 DIAGNOSIS — O99019 Anemia complicating pregnancy, unspecified trimester: Secondary | ICD-10-CM

## 2013-07-19 DIAGNOSIS — J069 Acute upper respiratory infection, unspecified: Secondary | ICD-10-CM

## 2013-07-19 LAB — POCT URINALYSIS DIPSTICK
Blood, UA: NEGATIVE
Glucose, UA: NEGATIVE
Ketones, UA: NEGATIVE
Nitrite, UA: NEGATIVE
Protein, UA: NEGATIVE

## 2013-07-19 MED ORDER — AZITHROMYCIN 250 MG PO TABS: ORAL_TABLET | ORAL | Status: DC

## 2013-07-19 NOTE — Progress Notes (Signed)
Reports good fm. Denies regular uc's, lof, vb, urinary frequency, urgency, hesitancy, or dysuria.  Some occ BH, and pressure. Denies abnormal/malodorous vag d/c or vulvovaginal itching/irritatin. Spec exam: small amt creamy white nonodorous d/c, cx visually closed. SVE: LTC, high.  Cough productive of green phlegm/congestion continues, not improving w/ OTC measures. Rx zpak. Reviewed ptl s/s, fkc.  All questions answered. F/U in 2wks for visit.

## 2013-07-19 NOTE — Patient Instructions (Signed)
Preterm Labor Information Preterm labor is when labor starts at less than 37 weeks of pregnancy. The normal length of a pregnancy is 39 to 41 weeks. CAUSES Often, there is no identifiable underlying cause as to why a woman goes into preterm labor. One of the most common known causes of preterm labor is infection. Infections of the uterus, cervix, vagina, amniotic sac, bladder, kidney, or even the lungs (pneumonia) can cause labor to start. Other suspected causes of preterm labor include:   Urogenital infections, such as yeast infections and bacterial vaginosis.   Uterine abnormalities (uterine shape, uterine septum, fibroids, or bleeding from the placenta).   A cervix that has been operated on (it may fail to stay closed).   Malformations in the fetus.   Multiple gestations (twins, triplets, and so on).   Breakage of the amniotic sac.  RISK FACTORS  Having a previous history of preterm labor.   Having premature rupture of membranes (PROM).   Having a placenta that covers the opening of the cervix (placenta previa).   Having a placenta that separates from the uterus (placental abruption).   Having a cervix that is too weak to hold the fetus in the uterus (incompetent cervix).   Having too much fluid in the amniotic sac (polyhydramnios).   Taking illegal drugs or smoking while pregnant.   Not gaining enough weight while pregnant.   Being younger than 18 and older than 20 years old.   Having a low socioeconomic status.   Being African American. SYMPTOMS Signs and symptoms of preterm labor include:   Menstrual-like cramps, abdominal pain, or back pain.  Uterine contractions that are regular, as frequent as six in an hour, regardless of their intensity (may be mild or painful).  Contractions that start on the top of the uterus and spread down to the lower abdomen and back.   A sense of increased pelvic pressure.   A watery or bloody mucus discharge that  comes from the vagina.  TREATMENT Depending on the length of the pregnancy and other circumstances, your health care provider may suggest bed rest. If necessary, there are medicines that can be given to stop contractions and to mature the fetal lungs. If labor happens before 34 weeks of pregnancy, a prolonged hospital stay may be recommended. Treatment depends on the condition of both you and the fetus.  WHAT SHOULD YOU DO IF YOU THINK YOU ARE IN PRETERM LABOR? Call your health care provider right away. You will need to go to the hospital to get checked immediately. HOW CAN YOU PREVENT PRETERM LABOR IN FUTURE PREGNANCIES? You should:   Stop smoking if you smoke.  Maintain healthy weight gain and avoid chemicals and drugs that are not necessary.  Be watchful for any type of infection.  Inform your health care provider if you have a known history of preterm labor. Document Released: 10/18/2003 Document Revised: 03/30/2013 Document Reviewed: 08/30/2012 [employer] Patient Information 2014 [employer].    

## 2013-07-21 LAB — URINE CULTURE
Colony Count: NO GROWTH
Organism ID, Bacteria: NO GROWTH

## 2013-08-01 ENCOUNTER — Ambulatory Visit (INDEPENDENT_AMBULATORY_CARE_PROVIDER_SITE_OTHER): Payer: Medicaid Other | Admitting: Advanced Practice Midwife

## 2013-08-01 ENCOUNTER — Encounter: Payer: Self-pay | Admitting: Advanced Practice Midwife

## 2013-08-01 VITALS — BP 116/68 | Wt 179.5 lb

## 2013-08-01 DIAGNOSIS — O360990 Maternal care for other rhesus isoimmunization, unspecified trimester, not applicable or unspecified: Secondary | ICD-10-CM

## 2013-08-01 DIAGNOSIS — Z1389 Encounter for screening for other disorder: Secondary | ICD-10-CM

## 2013-08-01 DIAGNOSIS — Z331 Pregnant state, incidental: Secondary | ICD-10-CM

## 2013-08-01 DIAGNOSIS — O99340 Other mental disorders complicating pregnancy, unspecified trimester: Secondary | ICD-10-CM

## 2013-08-01 DIAGNOSIS — O99019 Anemia complicating pregnancy, unspecified trimester: Secondary | ICD-10-CM

## 2013-08-01 LAB — POCT URINALYSIS DIPSTICK
Glucose, UA: NEGATIVE
Ketones, UA: NEGATIVE
Nitrite, UA: NEGATIVE

## 2013-08-01 NOTE — Progress Notes (Signed)
C/O bilateral breast pain.  Recommended ice/tight bra, even at night.  Routine questions about pregnancy answered.  F/U in 2 weeks for LROB.

## 2013-08-02 ENCOUNTER — Encounter: Payer: Medicaid Other | Admitting: Advanced Practice Midwife

## 2013-08-15 ENCOUNTER — Encounter: Payer: Self-pay | Admitting: Women's Health

## 2013-08-15 ENCOUNTER — Ambulatory Visit (INDEPENDENT_AMBULATORY_CARE_PROVIDER_SITE_OTHER): Payer: Medicaid Other | Admitting: Women's Health

## 2013-08-15 VITALS — BP 120/82 | Temp 97.9°F | Wt 183.5 lb

## 2013-08-15 DIAGNOSIS — Z331 Pregnant state, incidental: Secondary | ICD-10-CM

## 2013-08-15 DIAGNOSIS — O99340 Other mental disorders complicating pregnancy, unspecified trimester: Secondary | ICD-10-CM

## 2013-08-15 DIAGNOSIS — Z3403 Encounter for supervision of normal first pregnancy, third trimester: Secondary | ICD-10-CM

## 2013-08-15 DIAGNOSIS — O360990 Maternal care for other rhesus isoimmunization, unspecified trimester, not applicable or unspecified: Secondary | ICD-10-CM

## 2013-08-15 DIAGNOSIS — Z1389 Encounter for screening for other disorder: Secondary | ICD-10-CM

## 2013-08-15 DIAGNOSIS — O99019 Anemia complicating pregnancy, unspecified trimester: Secondary | ICD-10-CM

## 2013-08-15 LAB — POCT URINALYSIS DIPSTICK
Blood, UA: NEGATIVE
Glucose, UA: NEGATIVE
Ketones, UA: NEGATIVE
Nitrite, UA: NEGATIVE
Protein, UA: NEGATIVE

## 2013-08-15 NOTE — Progress Notes (Signed)
Reports good fm. Denies uc's, lof, vb, urinary frequency, urgency, hesitancy, or dysuria.  Reflux, discussed prevention/relief measures. LBP- discussed relief measures. Reviewed ptl s/s, fkc.  All questions answered. F/U in 1wk for visit and gbs.

## 2013-08-15 NOTE — Patient Instructions (Signed)
Call the office or go to [facility] if:  You begin to have strong, frequent contractions  Your water breaks.  Sometimes it is a big gush of fluid, sometimes it is just a trickle that keeps getting your panties wet or running down your legs  You have vaginal bleeding.  It is normal to have a small amount of spotting if your cervix was checked.   You don't feel your baby moving like normal.  If you don't, get you something to eat and drink and lay down and focus on feeling your baby move.  You should feel at least 10 movements in 2 hours.  If you don't, you should call the office or go to [facility]   Heartburn During Pregnancy  Heartburn is a burning sensation in the chest caused by stomach acid backing up into the esophagus. Heartburn (also known as "reflux") is common in pregnancy because a certain hormone (progesterone) changes. The progesterone hormone may relax the valve that separates the esophagus from the stomach. This allows acid to go up into the esophagus, causing heartburn. Heartburn may also happen in pregnancy because the enlarging uterus pushes up on the stomach, which pushes more acid into the esophagus. This is especially true in the later stages of pregnancy. Heartburn problems usually go away after giving birth. CAUSES   The progesterone hormone.  Changing hormone levels.  The growing uterus that pushes stomach acid upward.  Large meals.  Certain foods and drinks.  Exercise.  Increased acid production. SYMPTOMS   Burning pain in the chest or lower throat.  Bitter taste in the mouth.  Coughing. DIAGNOSIS  Heartburn is typically diagnosed by your caregiver when taking a careful history of your concern. Your caregiver may order a blood test to check for a certain type of bacteria that is associated with heartburn. Sometimes, heartburn is diagnosed by prescribing a heartburn medicine to see if the symptoms improve. It is rare in pregnancy to have a  procedure called an endoscopy. This is when a tube with a light and a camera on the end is used to examine the esophagus and the stomach. TREATMENT   Your caregiver may tell you to use certain over-the-counter medicines (antacids, acid reducers) for mild heartburn.  Your caregiver may prescribe medicines to decrease stomach acid or to protect your stomach lining.  Your caregiver may recommend certain diet changes.  For severe cases, your caregiver may recommend that the head of the bed be elevated on blocks. (Sleeping with more pillows is not an effective treatment as it only changes the position of your head and does not improve the main problem of stomach acid refluxing into the esophagus.) HOME CARE INSTRUCTIONS   Take all medicines as directed by your caregiver.  Raise the head of your bed by putting blocks under the legs if instructed to by your caregiver.  Do not exercise right after eating.  Avoid eating 2 or 3 hours before bed. Do not lie down right after eating.  Eat small meals throughout the day instead of 3 large meals.  Identify foods and beverages that make your symptoms worse and avoid them. Foods you may want to avoid include:  Peppers.  Chocolate.  High-fat foods, including fried foods.  Spicy foods.  Garlic and onions.  Citrus fruits, including oranges, grapefruit, lemons, and limes.  Food containing tomatoes or tomato products.  Mint.  Carbonated and caffeinated drinks.  Vinegar. SEEK IMMEDIATE MEDICAL CARE IF:   You have severe chest  pain that goes down your arm or into your jaw or neck.  You feel sweaty, dizzy, or lightheaded.  You become short of breath.  You vomit blood.  You have difficulty or pain with swallowing.  You have bloody or black, tarry stools.  You have episodes of heartburn more than 3 times a week, for more than 2 weeks. MAKE SURE YOU:  Understand these instructions.  Will watch your condition.  Will get help right  away if you are not doing well or get worse. Document Released: 07/25/2000 Document Revised: 10/20/2011 Document Reviewed: 03/16/2013 [facility] Patient Information 2014 [employer], [place].

## 2013-08-20 ENCOUNTER — Encounter (HOSPITAL_COMMUNITY): Payer: Self-pay | Admitting: Emergency Medicine

## 2013-08-20 ENCOUNTER — Observation Stay (HOSPITAL_COMMUNITY)
Admission: AD | Admit: 2013-08-20 | Discharge: 2013-08-22 | DRG: 780 | Disposition: A | Discharge status: Home / Self Care | Payer: Medicaid Other | Attending: Obstetrics & Gynecology | Admitting: Obstetrics & Gynecology

## 2013-08-20 DIAGNOSIS — Y92410 Unspecified street and highway as the place of occurrence of the external cause: Secondary | ICD-10-CM

## 2013-08-20 DIAGNOSIS — O9989 Other specified diseases and conditions complicating pregnancy, childbirth and the puerperium: Secondary | ICD-10-CM

## 2013-08-20 DIAGNOSIS — R109 Unspecified abdominal pain: Secondary | ICD-10-CM | POA: Diagnosis present

## 2013-08-20 DIAGNOSIS — Z3493 Encounter for supervision of normal pregnancy, unspecified, third trimester: Secondary | ICD-10-CM

## 2013-08-20 DIAGNOSIS — O360990 Maternal care for other rhesus isoimmunization, unspecified trimester, not applicable or unspecified: Secondary | ICD-10-CM | POA: Diagnosis present

## 2013-08-20 DIAGNOSIS — O99891 Other specified diseases and conditions complicating pregnancy: Secondary | ICD-10-CM | POA: Diagnosis present

## 2013-08-20 DIAGNOSIS — M545 Low back pain, unspecified: Secondary | ICD-10-CM | POA: Diagnosis present

## 2013-08-20 DIAGNOSIS — O360131 Maternal care for anti-D [Rh] antibodies, third trimester, fetus 1: Secondary | ICD-10-CM

## 2013-08-20 DIAGNOSIS — Z87891 Personal history of nicotine dependence: Secondary | ICD-10-CM

## 2013-08-20 DIAGNOSIS — Z3403 Encounter for supervision of normal first pregnancy, third trimester: Secondary | ICD-10-CM

## 2013-08-20 DIAGNOSIS — O479 False labor, unspecified: Principal | ICD-10-CM | POA: Diagnosis present

## 2013-08-20 LAB — LIPASE, BLOOD: Lipase: 39 U/L (ref 11–59)

## 2013-08-20 LAB — CBC WITH DIFFERENTIAL/PLATELET
Basophils Absolute: 0 10*3/uL (ref 0.0–0.1)
Basophils Relative: 0 % (ref 0–1)
Eosinophils Absolute: 0.1 10*3/uL (ref 0.0–0.7)
Eosinophils Relative: 1 % (ref 0–5)
HCT: 37.5 % (ref 36.0–46.0)
Hemoglobin: 13.1 g/dL (ref 12.0–15.0)
Lymphocytes Relative: 25 % (ref 12–46)
Lymphs Abs: 2.8 10*3/uL (ref 0.7–4.0)
MCH: 29 pg (ref 26.0–34.0)
MCHC: 34.9 g/dL (ref 30.0–36.0)
MCV: 83.1 fL (ref 78.0–100.0)
Monocytes Absolute: 0.8 10*3/uL (ref 0.1–1.0)
Monocytes Relative: 7 % (ref 3–12)
Neutro Abs: 7.6 10*3/uL (ref 1.7–7.7)
Neutrophils Relative %: 68 % (ref 43–77)
Platelets: 264 10*3/uL (ref 150–400)
RBC: 4.51 MIL/uL (ref 3.87–5.11)
RDW: 14.7 % (ref 11.5–15.5)
WBC: 11.2 10*3/uL — ABNORMAL HIGH (ref 4.0–10.5)

## 2013-08-20 LAB — URINALYSIS, ROUTINE W REFLEX MICROSCOPIC
Bilirubin Urine: NEGATIVE
Glucose, UA: NEGATIVE mg/dL
Hgb urine dipstick: NEGATIVE
Ketones, ur: NEGATIVE mg/dL
Nitrite: NEGATIVE
Protein, ur: NEGATIVE mg/dL
Specific Gravity, Urine: 1.01 (ref 1.005–1.030)
Urobilinogen, UA: 0.2 mg/dL (ref 0.0–1.0)
pH: 7 (ref 5.0–8.0)

## 2013-08-20 LAB — COMPREHENSIVE METABOLIC PANEL
ALT: 14 U/L (ref 0–35)
AST: 21 U/L (ref 0–37)
Albumin: 3 g/dL — ABNORMAL LOW (ref 3.5–5.2)
Alkaline Phosphatase: 207 U/L — ABNORMAL HIGH (ref 39–117)
BUN: 5 mg/dL — ABNORMAL LOW (ref 6–23)
CO2: 19 mEq/L (ref 19–32)
Calcium: 9.3 mg/dL (ref 8.4–10.5)
Chloride: 103 mEq/L (ref 96–112)
Creatinine, Ser: 0.56 mg/dL (ref 0.50–1.10)
GFR calc Af Amer: >90 mL/min (ref >90)
GFR calc non Af Amer: >90 mL/min (ref >90)
Glucose, Bld: 86 mg/dL (ref 70–99)
Potassium: 3.8 mEq/L (ref 3.7–5.3)
Sodium: 138 mEq/L (ref 137–147)
Total Bilirubin: <0.2 mg/dL — ABNORMAL LOW (ref 0.3–1.2)
Total Protein: 6.9 g/dL (ref 6.0–8.3)

## 2013-08-20 LAB — URINE MICROSCOPIC-ADD ON

## 2013-08-20 LAB — ABO/RH: ABO/RH(D): A NEG

## 2013-08-20 MED ORDER — SODIUM CHLORIDE 0.9 % IV BOLUS (SEPSIS)
1000.0000 mL | Freq: Once | INTRAVENOUS | Status: AC
  Administered 2013-08-20: 1000 mL via INTRAVENOUS

## 2013-08-20 MED ORDER — ACETAMINOPHEN 325 MG PO TABS
650.0000 mg | ORAL_TABLET | Freq: Once | ORAL | Status: AC
  Administered 2013-08-20: 650 mg via ORAL
  Filled 2013-08-20: qty 2

## 2013-08-20 MED ORDER — SODIUM CHLORIDE 0.9 % IV SOLN
INTRAVENOUS | Status: DC
  Administered 2013-08-20: via INTRAVENOUS

## 2013-08-20 MED ORDER — RHO D IMMUNE GLOBULIN 1500 UNIT/2ML IJ SOLN
300.0000 ug | Freq: Once | INTRAMUSCULAR | Status: AC
  Administered 2013-08-20: 300 ug via INTRAMUSCULAR
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
## 2013-08-20 NOTE — ED Notes (Signed)
Pt requesting tylenol for pain

## 2013-08-20 NOTE — Progress Notes (Signed)
[facility] spoke with Dr Erin FullingHarraway-Smith about pt; [facility] after pt is cleared by the ED; no need to check cervix if pt is not contracting.

## 2013-08-20 NOTE — Progress Notes (Signed)
[facility] spoke with Dr Fonnie JarvisBednar and told of the attending at [facility] once the pt is cleared by the ED.  Dr Fonnie JarvisBednar agrees with plan of care and will call and consult with Dr Erin FullingHarraway-Smith about transfer.
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
## 2013-08-21 ENCOUNTER — Encounter (HOSPITAL_COMMUNITY): Payer: Self-pay | Admitting: *Deleted

## 2013-08-21 DIAGNOSIS — O99891 Other specified diseases and conditions complicating pregnancy: Secondary | ICD-10-CM

## 2013-08-21 DIAGNOSIS — R109 Unspecified abdominal pain: Secondary | ICD-10-CM

## 2013-08-21 DIAGNOSIS — O479 False labor, unspecified: Secondary | ICD-10-CM

## 2013-08-21 DIAGNOSIS — O360990 Maternal care for other rhesus isoimmunization, unspecified trimester, not applicable or unspecified: Secondary | ICD-10-CM

## 2013-08-21 DIAGNOSIS — O9989 Other specified diseases and conditions complicating pregnancy, childbirth and the puerperium: Secondary | ICD-10-CM

## 2013-08-21 LAB — RHOGAM INJECTION: Unit division: 0

## 2013-08-21 LAB — OB RESULTS CONSOLE GBS: GBS: POSITIVE

## 2013-08-21 MED ORDER — ZOLPIDEM TARTRATE 5 MG PO TABS
5.0000 mg | ORAL_TABLET | Freq: Every evening | ORAL | Status: DC | PRN
  Administered 2013-08-21: 5 mg via ORAL
  Filled 2013-08-21: qty 1

## 2013-08-21 MED ORDER — SODIUM CHLORIDE 0.9 % IV SOLN
250.0000 mL | INTRAVENOUS | Status: DC | PRN
Start: 2013-08-21 — End: 2013-08-22

## 2013-08-21 MED ORDER — SODIUM CHLORIDE 0.9 % IJ SOLN: 3.0000 mL | INTRAMUSCULAR | Status: DC | PRN

## 2013-08-21 MED ORDER — SODIUM CHLORIDE 0.9 % IJ SOLN
3.0000 mL | Freq: Two times a day (BID) | INTRAMUSCULAR | Status: DC
  Administered 2013-08-21: 3 mL via INTRAVENOUS

## 2013-08-21 MED ORDER — DOCUSATE SODIUM 100 MG PO CAPS
100.0000 mg | ORAL_CAPSULE | Freq: Every day | ORAL | Status: DC
  Administered 2013-08-21: 100 mg via ORAL
  Filled 2013-08-21: qty 1

## 2013-08-21 MED ORDER — CALCIUM CARBONATE ANTACID 500 MG PO CHEW
2.0000 | CHEWABLE_TABLET | ORAL | Status: DC | PRN
Start: 2013-08-21 — End: 2013-08-22
  Administered 2013-08-22: 400 mg via ORAL
  Filled 2013-08-21 (×2): qty 1

## 2013-08-21 MED ORDER — LORATADINE 10 MG PO TABS
10.0000 mg | ORAL_TABLET | Freq: Every day | ORAL | Status: DC
  Administered 2013-08-21: 10 mg via ORAL
  Filled 2013-08-21 (×2): qty 1

## 2013-08-21 MED ORDER — ACETAMINOPHEN 325 MG PO TABS
650.0000 mg | ORAL_TABLET | ORAL | Status: DC | PRN
  Administered 2013-08-21: 650 mg via ORAL
  Filled 2013-08-21: qty 2

## 2013-08-21 MED ORDER — PRENATAL MULTIVITAMIN CH
1.0000 | ORAL_TABLET | Freq: Every day | ORAL | Status: DC
  Administered 2013-08-21: 1 via ORAL
  Filled 2013-08-21: qty 1

## 2013-08-21 NOTE — MAU Note (Signed)
Report called to Kylie RN in Antenatal.  
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
## 2013-08-22 ENCOUNTER — Encounter: Payer: Self-pay | Admitting: Obstetrics & Gynecology

## 2013-08-22 ENCOUNTER — Ambulatory Visit (INDEPENDENT_AMBULATORY_CARE_PROVIDER_SITE_OTHER): Payer: Medicaid Other | Admitting: Obstetrics & Gynecology

## 2013-08-22 VITALS — BP 110/60 | Wt 188.0 lb

## 2013-08-22 DIAGNOSIS — Z1389 Encounter for screening for other disorder: Secondary | ICD-10-CM

## 2013-08-22 DIAGNOSIS — O360990 Maternal care for other rhesus isoimmunization, unspecified trimester, not applicable or unspecified: Secondary | ICD-10-CM

## 2013-08-22 DIAGNOSIS — O99340 Other mental disorders complicating pregnancy, unspecified trimester: Secondary | ICD-10-CM

## 2013-08-22 DIAGNOSIS — O99019 Anemia complicating pregnancy, unspecified trimester: Secondary | ICD-10-CM

## 2013-08-22 DIAGNOSIS — Z331 Pregnant state, incidental: Secondary | ICD-10-CM

## 2013-08-22 DIAGNOSIS — Z3403 Encounter for supervision of normal first pregnancy, third trimester: Secondary | ICD-10-CM

## 2013-08-22 LAB — POCT URINALYSIS DIPSTICK
Blood, UA: NEGATIVE
Glucose, UA: NEGATIVE
Ketones, UA: NEGATIVE
Leukocytes, UA: NEGATIVE
Nitrite, UA: NEGATIVE
Protein, UA: NEGATIVE

## 2013-08-22 NOTE — Discharge Instructions (Signed)
Abdominal Pain During Pregnancy °Abdominal pain is common in pregnancy. Most of the time, it does not cause harm. There are many causes of abdominal pain. Some causes are more serious than others. Some of the causes of abdominal pain in pregnancy are easily diagnosed. Occasionally, the diagnosis takes time to understand. Other times, the cause is not determined. Abdominal pain can be a sign that something is very wrong with the pregnancy, or the pain may have nothing to do with the pregnancy at all. For this reason, always tell your health care provider if you have any abdominal discomfort. °HOME CARE INSTRUCTIONS  °Monitor your abdominal pain for any changes. The following actions may help to alleviate any discomfort you are experiencing: °· Do not have sexual intercourse or put anything in your vagina until your symptoms go away completely. °· Get plenty of rest until your pain improves. °· Drink clear fluids if you feel nauseous. Avoid solid food as long as you are uncomfortable or nauseous. °· Only take over-the-counter or prescription medicine as directed by your health care provider. °· Keep all follow-up appointments with your health care provider. °SEEK IMMEDIATE MEDICAL CARE IF: °· You are bleeding, leaking fluid, or passing tissue from the vagina. °· You have increasing pain or cramping. °· You have persistent vomiting. °· You have painful or bloody urination. °· You have a fever. °· You notice a decrease in your baby's movements. °· You have extreme weakness or feel faint. °· You have shortness of breath, with or without abdominal pain. °· You develop a severe headache with abdominal pain. °· You have abnormal vaginal discharge with abdominal pain. °· You have persistent diarrhea. °· You have abdominal pain that continues even after rest, or gets worse. °MAKE SURE YOU:  °· Understand these instructions. °· Will watch your condition. °· Will get help right away if you are not doing well or get  worse. °Document Released: 07/28/2005 Document Revised: 05/18/2013 Document Reviewed: 02/24/2013 °ExitCare® Patient Information ©2014 [employer]. ° °
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
## 2013-08-22 NOTE — Progress Notes (Signed)
Post discharge review completed. 

## 2013-08-22 NOTE — Progress Notes (Signed)
Pt. Is stable and ready to be discharged. Pt. Came up to Nurse's station during shift change to get discharge papers. All discharge instructions given to the patient. All medications reviewed. Pt. Wanted to walk out with [facility]. NT escorted them to the door. Pt. States she has a f/u appointment. All belongings with the patient.
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
## 2013-08-23 LAB — CULTURE, BETA STREP (GROUP B ONLY)

## 2013-08-23 LAB — GC/CHLAMYDIA PROBE AMP
CT Probe RNA: NEGATIVE
GC Probe RNA: NEGATIVE

## 2013-08-24 ENCOUNTER — Encounter: Payer: Self-pay | Admitting: Family Medicine

## 2013-08-24 ENCOUNTER — Telehealth: Payer: Self-pay
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
## 2013-08-29 ENCOUNTER — Encounter: Payer: Medicaid Other | Admitting: Obstetrics and Gynecology

## 2013-08-30 ENCOUNTER — Ambulatory Visit (INDEPENDENT_AMBULATORY_CARE_PROVIDER_SITE_OTHER): Payer: Medicaid Other | Admitting: Women's Health

## 2013-08-30 VITALS — BP 106/70 | Wt 186.0 lb

## 2013-08-30 DIAGNOSIS — Z3480 Encounter for supervision of other normal pregnancy, unspecified trimester: Secondary | ICD-10-CM

## 2013-08-30 DIAGNOSIS — O99019 Anemia complicating pregnancy, unspecified trimester: Secondary | ICD-10-CM

## 2013-08-30 DIAGNOSIS — O360990 Maternal care for other rhesus isoimmunization, unspecified trimester, not applicable or unspecified: Secondary | ICD-10-CM

## 2013-08-30 DIAGNOSIS — O99340 Other mental disorders complicating pregnancy, unspecified trimester: Secondary | ICD-10-CM

## 2013-08-30 DIAGNOSIS — Z1389 Encounter for screening for other disorder: Secondary | ICD-10-CM

## 2013-08-30 DIAGNOSIS — Z331 Pregnant state, incidental: Secondary | ICD-10-CM

## 2013-08-30 LAB — POCT URINALYSIS DIPSTICK
Blood, UA: NEGATIVE
Glucose, UA: NEGATIVE
Ketones, UA: NEGATIVE
Nitrite, UA: NEGATIVE

## 2013-08-30 NOTE — Patient Instructions (Addendum)
[place] Pediatricians:  [facility] [phone]            [facility] [phone]                 [facility] [phone] (usually doesn't accept new patients unless you have family there already, you are always welcome to call and ask)             [facility] ([address]) [phone]   [facility] Pediatricians:   [facility]: [phone]  [facility]: [phone]   Deberah Pelton Contractions Pregnancy is commonly associated with contractions of the uterus throughout the pregnancy. Towards the end of pregnancy (32 to 34 weeks), these contractions [facility] Willa Rough) can develop more often and may become more forceful. This is not true labor because these contractions do not result in opening (dilatation) and thinning of the cervix. They are sometimes difficult to tell apart from true labor because these contractions can be forceful and people have different pain tolerances. You should not feel embarrassed if you go to the hospital with false labor. Sometimes, the only way to tell if you are in true labor is for your caregiver to follow the changes in the cervix. How to tell the difference between true and false labor:  False labor.  The contractions of false labor are usually shorter, irregular and not as hard as those of true labor.  They are often felt in the front of the lower abdomen and in the groin.  They may leave with walking around or changing positions while lying down.  They get weaker and are shorter lasting as time goes on.  These contractions are usually irregular.  They do not usually become progressively stronger, regular and closer together as with true labor.  True labor.  Contractions in true labor last 30 to 70 seconds, become very regular, usually become more intense, and increase in frequency.  They do not go away with walking.  The  discomfort is usually felt in the top of the uterus and spreads to the lower abdomen and low back.  True labor can be determined by your caregiver with an exam. This will show that the cervix is dilating and getting thinner. If there are no prenatal problems or other health problems associated with the pregnancy, it is completely safe to be sent home with false labor and await the onset of true labor. HOME CARE INSTRUCTIONS   Keep up with your usual exercises and instructions.  Take medications as directed.  Keep your regular prenatal appointment.  Eat and drink lightly if you think you are going into labor.  If BH contractions are making you uncomfortable:  Change your activity position from lying down or resting to walking/walking to resting.  Sit and rest in a tub of warm water.  Drink 2 to 3 glasses of water. Dehydration may cause B-H contractions.  Do slow and deep breathing several times an hour. SEEK IMMEDIATE MEDICAL CARE IF:   Your contractions continue to become stronger, more regular, and closer together.  You have a gushing, burst or leaking of fluid from the vagina.  An oral temperature above 102 F (38.9 C) develops.  You have passage of blood-tinged mucus.  You develop vaginal bleeding.  You develop continuous belly (abdominal) pain.  You have low back pain that you never had before.  You feel the baby's head pushing down causing pelvic pressure.  The baby is not moving  as much as it used to. Document Released: 07/28/2005 Document Revised: 10/20/2011 Document Reviewed: 05/09/2013 [facility] Patient Information 2014 [place], [place].  Group B Streptococcus Infection During Pregnancy Group B streptococcus (GBS) is a type of bacteria often found in healthy women. GBS usually does not cause any symptoms or harm to healthy adult women, but the bacteria can make a baby very sick if it is passed to the baby during childbirth. GBS is not a sexually transmitted  disease (STD). GBS is different from Group A streptococcus, the bacteria that causes "strep throat." CAUSES  GBS bacteria can be found in the intestinal, reproductive, and urinary tracts of women. It can also be found in the female genital tract, most often in the vagina and rectal areas.  SYMPTOMS  In pregnancy, GBS can be in the following places:  Genital tract without symptoms.  Rectum without symptoms.  Urine with or without symptoms (asymptomatic bacteriuria).  Urinary symptoms can include pain, frequency, urgency, and blood with urination (cystitis). Pregnant women who are infected with GBS are at increased risk of:  Early (premature) labor and delivery.  Prolonged rupture of the membranes.  Infection in the following places:  Bladder.  Kidneys (pyelonephritis).  Bag of waters or placenta (chorioamnionitis).  Uterus (endometritis) after delivery. Newborns who are infected with GBS can develop:  Lung infection (pneumonia).  Blood infection (septicemia).  Infection of the lining of the brain and spinal cord (meningitis). DIAGNOSIS  Diagnosis of GBS infection is made by screening tests done when you are 35 to [redacted] weeks pregnant. The test (culture) is an easy swab of the vagina and rectum. A sample of your urine might also be checked for the bacteria. Talk with your caregiver about a plan for labor if your test shows that you carry the GBS bacteria. TREATMENT  If results of the culture are positive, showing that GBS is present, you likely will receive treatment with antibiotic medicines during labor. This will help prevent GBS from being passed to your baby. The antibiotics work only if they are given during labor. If treatment is given earlier in pregnancy, the bacteria may regrow and be present during labor. Tell your caregiver if you are allergic to penicillin or other antibiotics. Antibiotics are also given if:   Infection of the membranes (amnionitis) is  suspected.  Labor begins or there is rupture of the membranes before 37 weeks of pregnancy and there is a high risk of delivering the baby.  The mother has a past history of giving birth to an infant with GBS infection.  The culture status is unknown (culture not performed or result not available) and there is:  Fever during labor.  Preterm labor (less than 37 weeks of pregnancy).  Prolonged rupture of membranes (18 hours or more). Treatment of the mother during labor is not recommended when:  A planned cesarean delivery is done and there is no labor or ruptured membranes. This is true even if the mother tested positive for GBS.  There is a negative culture for GBS screening during the pregnancy, regardless of the risk factors during labor. The infant will receive antibiotics if the infant tested positive for GBS or has signs and symptoms that suggest GBS infection is present. It is not recommended to routinely give antibiotics to infants whose mothers received antibiotic treatment during labor. HOME CARE INSTRUCTIONS   Take all antibiotics as prescribed by your caregiver.  Only take medicine as directed by your caregiver.  Continue with prenatal visits and care.  Return for follow-up appointments and cultures.  Follow your caregiver's instructions. SEEK MEDICAL CARE IF:   You have pain with urination.  You have frequent urination.  You have blood in your urine. SEEK IMMEDIATE MEDICAL CARE IF:  You have a fever.  You have pain in the back, side, or uterus.  You have chills.  You have abdominal swelling (distension) or pain.  You have labor pains (contractions) every 10 minutes or more often.  You are leaking fluid or bleeding from your vagina.  You have pelvic pressure that feels like your baby is pushing down.  You have a low, dull backache.  You have cramps that feel like your period.  You have abdominal cramps with or without diarrhea.  You have repeated  vomiting and diarrhea.  You have trouble breathing.  You develop confusion.  You have stiffness of your body or neck. MAKE SURE YOU:   Understand these instructions.  Will watch your condition.  Will get help right away if you are not doing well or get worse. Document Released: 11/04/2007 Document Revised: 10/20/2011 Document Reviewed: 12/08/2010 [facility] Patient Information 2014 [place].

## 2013-08-30 NOTE — Progress Notes (Signed)
Reports good fm. Denies uc's, lof, vb, uti s/s.  No complaints.  [facility], doing fine now. Reports losing part of mucous plug yesterday. Requests SVE, no change since last exam. Reviewed labor s/s, fkc.  All questions answered. F/U in 1wk for visit.

## 2013-09-05 ENCOUNTER — Telehealth: Payer: Self-pay | Admitting: *Deleted

## 2013-09-05 NOTE — Telephone Encounter (Signed)
Pt asking if the provider will schedule induction tomorrow. Informed pt to discuss with Rodena PietyFran Cresenzo, CNM.

## 2013-09-06 ENCOUNTER — Ambulatory Visit (INDEPENDENT_AMBULATORY_CARE_PROVIDER_SITE_OTHER): Payer: Medicaid Other | Admitting: Advanced Practice Midwife

## 2013-09-06 VITALS — BP 130/60 | Wt 192.8 lb

## 2013-09-06 DIAGNOSIS — O360990 Maternal care for other rhesus isoimmunization, unspecified trimester, not applicable or unspecified: Secondary | ICD-10-CM

## 2013-09-06 DIAGNOSIS — O99019 Anemia complicating pregnancy, unspecified trimester: Secondary | ICD-10-CM

## 2013-09-06 DIAGNOSIS — Z331 Pregnant state, incidental: Secondary | ICD-10-CM

## 2013-09-06 DIAGNOSIS — Z1389 Encounter for screening for other disorder: Secondary | ICD-10-CM

## 2013-09-06 DIAGNOSIS — O99340 Other mental disorders complicating pregnancy, unspecified trimester: Secondary | ICD-10-CM

## 2013-09-06 LAB — POCT URINALYSIS DIPSTICK
Glucose, UA: NEGATIVE
Ketones, UA: NEGATIVE
Nitrite, UA: NEGATIVE

## 2013-09-06 NOTE — Progress Notes (Signed)
Wants to be induced.  Discussed IOL policies.  C/O lower back pain.  Routine questions about pregnancy answered.  F/U in 1 weeks for LROB.

## 2013-09-07 ENCOUNTER — Encounter (HOSPITAL_COMMUNITY): Payer: Medicaid Other | Admitting: Anesthesiology

## 2013-09-07 ENCOUNTER — Inpatient Hospital Stay (HOSPITAL_COMMUNITY)
Admission: AD | Admit: 2013-09-07 | Discharge: 2013-09-10 | DRG: 765 | Disposition: A | Discharge status: Home / Self Care | Payer: Medicaid Other | Source: Ambulatory Visit | Attending: Obstetrics & Gynecology | Admitting: Obstetrics & Gynecology

## 2013-09-07 ENCOUNTER — Inpatient Hospital Stay (HOSPITAL_COMMUNITY): Payer: Medicaid Other | Admitting: Anesthesiology

## 2013-09-07 ENCOUNTER — Encounter (HOSPITAL_COMMUNITY): Payer: Self-pay

## 2013-09-07 DIAGNOSIS — O360990 Maternal care for other rhesus isoimmunization, unspecified trimester, not applicable or unspecified: Secondary | ICD-10-CM | POA: Diagnosis present

## 2013-09-07 DIAGNOSIS — O99892 Other specified diseases and conditions complicating childbirth: Secondary | ICD-10-CM | POA: Diagnosis present

## 2013-09-07 DIAGNOSIS — Z87891 Personal history of nicotine dependence: Secondary | ICD-10-CM

## 2013-09-07 DIAGNOSIS — O9989 Other specified diseases and conditions complicating pregnancy, childbirth and the puerperium: Secondary | ICD-10-CM

## 2013-09-07 DIAGNOSIS — Z8759 Personal history of other complications of pregnancy, childbirth and the puerperium: Secondary | ICD-10-CM | POA: Diagnosis present

## 2013-09-07 DIAGNOSIS — O139 Gestational [pregnancy-induced] hypertension without significant proteinuria, unspecified trimester: Secondary | ICD-10-CM | POA: Diagnosis present

## 2013-09-07 DIAGNOSIS — O26899 Other specified pregnancy related conditions, unspecified trimester: Secondary | ICD-10-CM

## 2013-09-07 DIAGNOSIS — O99344 Other mental disorders complicating childbirth: Secondary | ICD-10-CM | POA: Diagnosis present

## 2013-09-07 DIAGNOSIS — Z22330 Carrier of Group B streptococcus: Secondary | ICD-10-CM

## 2013-09-07 DIAGNOSIS — Z6791 Unspecified blood type, Rh negative: Secondary | ICD-10-CM

## 2013-09-07 DIAGNOSIS — F909 Attention-deficit hyperactivity disorder, unspecified type: Secondary | ICD-10-CM | POA: Diagnosis present

## 2013-09-07 DIAGNOSIS — Z3400 Encounter for supervision of normal first pregnancy, unspecified trimester: Secondary | ICD-10-CM

## 2013-09-07 LAB — COMPREHENSIVE METABOLIC PANEL
ALT: 20 U/L (ref 0–35)
AST: 49 U/L — ABNORMAL HIGH (ref 0–37)
Albumin: 3 g/dL — ABNORMAL LOW (ref 3.5–5.2)
Alkaline Phosphatase: 175 U/L — ABNORMAL HIGH (ref 39–117)
BUN: 5 mg/dL — ABNORMAL LOW (ref 6–23)
CO2: 17 mEq/L — ABNORMAL LOW (ref 19–32)
Calcium: 9.2 mg/dL (ref 8.4–10.5)
Chloride: 102 mEq/L (ref 96–112)
Creatinine, Ser: 0.53 mg/dL (ref 0.50–1.10)
GFR calc Af Amer: >90 mL/min (ref >90)
GFR calc non Af Amer: >90 mL/min (ref >90)
Glucose, Bld: 90 mg/dL (ref 70–99)
Potassium: 4.7 mEq/L (ref 3.7–5.3)
Sodium: 135 mEq/L — ABNORMAL LOW (ref 137–147)
Total Bilirubin: 0.2 mg/dL — ABNORMAL LOW (ref 0.3–1.2)
Total Protein: 6.6 g/dL (ref 6.0–8.3)

## 2013-09-07 LAB — CBC
HCT: 37.4 % (ref 36.0–46.0)
Hemoglobin: 13.2 g/dL (ref 12.0–15.0)
MCH: 28.7 pg (ref 26.0–34.0)
MCHC: 35.3 g/dL (ref 30.0–36.0)
MCV: 81.3 fL (ref 78.0–100.0)
Platelets: 238 10*3/uL (ref 150–400)
RBC: 4.6 MIL/uL (ref 3.87–5.11)
RDW: 15.1 % (ref 11.5–15.5)
WBC: 15 10*3/uL — ABNORMAL HIGH (ref 4.0–10.5)

## 2013-09-07 LAB — RPR: RPR Ser Ql: NONREACTIVE

## 2013-09-07 LAB — PROTEIN / CREATININE RATIO, URINE
Creatinine, Urine: 56.11 mg/dL
Protein Creatinine Ratio: 0.19 — ABNORMAL HIGH (ref 0.00–0.15)
Total Protein, Urine: 10.8 mg/dL

## 2013-09-07 MED ORDER — PENICILLIN G POTASSIUM 5000000 UNITS IJ SOLR
2.5000 10*6.[IU] | INTRAMUSCULAR | Status: DC
  Administered 2013-09-07 – 2013-09-08 (×11): 2.5 10*6.[IU] via INTRAVENOUS
  Filled 2013-09-07 (×15): qty 2.5

## 2013-09-07 MED ORDER — TERBUTALINE SULFATE 1 MG/ML IJ SOLN: 0.2500 mg | Freq: Once | INTRAMUSCULAR | Status: AC | PRN

## 2013-09-07 MED ORDER — CITRIC ACID-SODIUM CITRATE 334-500 MG/5ML PO SOLN
30.0000 mL | ORAL | Status: DC | PRN
  Administered 2013-09-08: 30 mL via ORAL
  Filled 2013-09-07: qty 15

## 2013-09-07 MED ORDER — EPHEDRINE 5 MG/ML INJ
10.0000 mg | INTRAVENOUS | Status: DC | PRN
  Filled 2013-09-07 (×2): qty 4

## 2013-09-07 MED ORDER — PHENYLEPHRINE 40 MCG/ML (10ML) SYRINGE FOR IV PUSH (FOR BLOOD PRESSURE SUPPORT)
80.0000 ug | PREFILLED_SYRINGE | INTRAVENOUS | Status: DC | PRN
  Filled 2013-09-07 (×2): qty 10

## 2013-09-07 MED ORDER — LIDOCAINE HCL (PF) 1 % IJ SOLN: 30.0000 mL | INTRAMUSCULAR | Status: DC | PRN

## 2013-09-07 MED ORDER — LACTATED RINGERS IV SOLN
INTRAVENOUS | Status: DC
  Administered 2013-09-07 – 2013-09-08 (×5): via INTRAVENOUS

## 2013-09-07 MED ORDER — FENTANYL 2.5 MCG/ML BUPIVACAINE 1/10 % EPIDURAL INFUSION (WH - ANES)
14.0000 mL/h | INTRAMUSCULAR | Status: DC | PRN
  Administered 2013-09-08: 14 mL/h via EPIDURAL
  Filled 2013-09-07 (×2): qty 125

## 2013-09-07 MED ORDER — OXYCODONE-ACETAMINOPHEN 5-325 MG PO TABS: 1.0000 | ORAL_TABLET | ORAL | Status: DC | PRN

## 2013-09-07 MED ORDER — EPHEDRINE 5 MG/ML INJ: 10.0000 mg | INTRAVENOUS | Status: DC | PRN

## 2013-09-07 MED ORDER — IBUPROFEN 600 MG PO TABS: 600.0000 mg | ORAL_TABLET | Freq: Four times a day (QID) | ORAL | Status: DC | PRN

## 2013-09-07 MED ORDER — MISOPROSTOL 25 MCG QUARTER TABLET: 25.0000 ug | ORAL_TABLET | ORAL | Status: DC | PRN

## 2013-09-07 MED ORDER — LACTATED RINGERS IV SOLN
500.0000 mL | Freq: Once | INTRAVENOUS | Status: AC
  Administered 2013-09-08: 500 mL via INTRAVENOUS

## 2013-09-07 MED ORDER — PHENYLEPHRINE 40 MCG/ML (10ML) SYRINGE FOR IV PUSH (FOR BLOOD PRESSURE SUPPORT): 80.0000 ug | PREFILLED_SYRINGE | INTRAVENOUS | Status: DC | PRN

## 2013-09-07 MED ORDER — FLEET ENEMA 7-19 GM/118ML RE ENEM: 1.0000 | ENEMA | Freq: Every day | RECTAL | Status: DC | PRN

## 2013-09-07 MED ORDER — FENTANYL CITRATE 0.05 MG/ML IJ SOLN
100.0000 ug | INTRAMUSCULAR | Status: DC | PRN
  Administered 2013-09-07 – 2013-09-08 (×11): 100 ug via INTRAVENOUS
  Filled 2013-09-07 (×12): qty 2

## 2013-09-07 MED ORDER — OXYTOCIN 40 UNITS IN LACTATED RINGERS INFUSION - SIMPLE MED: 1.0000 m[IU]/min | INTRAVENOUS | Status: DC

## 2013-09-07 MED ORDER — OXYTOCIN BOLUS FROM INFUSION: 500.0000 mL | INTRAVENOUS | Status: DC

## 2013-09-07 MED ORDER — DIPHENHYDRAMINE HCL 50 MG/ML IJ SOLN: 12.5000 mg | INTRAMUSCULAR | Status: DC | PRN

## 2013-09-07 MED ORDER — OXYTOCIN 40 UNITS IN LACTATED RINGERS INFUSION - SIMPLE MED
62.5000 mL/h | INTRAVENOUS | Status: DC
  Filled 2013-09-07: qty 1000

## 2013-09-07 MED ORDER — PENICILLIN G POTASSIUM 5000000 UNITS IJ SOLR
5.0000 10*6.[IU] | Freq: Once | INTRAVENOUS | Status: AC
  Administered 2013-09-07: 5 10*6.[IU] via INTRAVENOUS
  Filled 2013-09-07: qty 5

## 2013-09-07 MED ORDER — ACETAMINOPHEN 325 MG PO TABS: 650.0000 mg | ORAL_TABLET | ORAL | Status: DC | PRN

## 2013-09-07 MED ORDER — ONDANSETRON HCL 4 MG/2ML IJ SOLN
4.0000 mg | Freq: Four times a day (QID) | INTRAMUSCULAR | Status: DC | PRN
  Administered 2013-09-07 – 2013-09-08 (×2): 4 mg via INTRAVENOUS
  Filled 2013-09-07 (×2): qty 2

## 2013-09-07 MED ORDER — LACTATED RINGERS IV SOLN: 500.0000 mL | INTRAVENOUS | Status: DC | PRN

## 2013-09-07 NOTE — Anesthesia Preprocedure Evaluation (Signed)
Anesthesia Evaluation  Patient identified by MRN, date of birth, ID band Patient awake    Reviewed: Allergy & Precautions, H&P , Patient's Chart, lab work & pertinent test results  Airway Mallampati: II TM Distance: >3 FB Neck ROM: full    Dental  (+) Teeth Intact   Pulmonary former smoker,  breath sounds clear to auscultation        Cardiovascular Rhythm:regular Rate:Normal     Neuro/Psych    GI/Hepatic   Endo/Other    Renal/GU      Musculoskeletal   Abdominal   Peds  Hematology   Anesthesia Other Findings       Reproductive/Obstetrics (+) Pregnancy                           Anesthesia Physical Anesthesia Plan  ASA: II  Anesthesia Plan: Epidural   Post-op Pain Management:    Induction:   Airway Management Planned:   Additional Equipment:   Intra-op Plan:   Post-operative Plan:   Informed Consent: I have reviewed the patients History and Physical, chart, labs and discussed the procedure including the risks, benefits and alternatives for the proposed anesthesia with the patient or authorized representative who has indicated his/her understanding and acceptance.   Dental Advisory Given  Plan Discussed with:   Anesthesia Plan Comments: (Labs checked- platelets confirmed with RN in room. Fetal heart tracing, per RN, reported to be stable enough for sitting procedure. Discussed epidural, and patient consents to the procedure:  included risk of possible headache,backache, failed block, allergic reaction, and nerve injury. This patient was asked if she had any questions or concerns before the procedure started.)        Anesthesia Quick Evaluation  

## 2013-09-07 NOTE — Progress Notes (Signed)
Jessica [name] is a 23 y.o. G1P0 at 595w3d admitted for induction for gestational hypertension.  Subjective: Reports worsening pain with contractions.   Objective: BP 153/90  Pulse 106  Temp(Src) 98.1 F (36.7 C) (Oral)  Resp 18  Ht 5\' 4"  (1.626 m)  Wt 87.091 kg (192 lb)  BMI 32.94 kg/m2  SpO2 99%  LMP 12/05/2012      FHT:  FHR: 135 bpm, variability: moderate,  accelerations:  Present,  decelerations:  Absent UC:   irregular, every 3-10 minutes SVE:   Dilation: 1 Effacement (%): 40 Station: -1 Exam by:: Dr. Richarda BladeAdamo  Labs: Lab Results  Component Value Date   WBC 15.0* 09/07/2013   HGB 13.2 09/07/2013   HCT 37.4 09/07/2013   MCV 81.3 09/07/2013   PLT 238 09/07/2013    Assessment / Plan: Augmentation of labor, progressing well foley bulb inserted and inflated w/ 60ml LR w/o difficulty  Labor: Progressing normally gHTN:  no signs or symptoms of toxicity, intake and ouput balanced and labs stable Fetal Wellbeing:  Category I Pain Control:  Labor support without medications I/D:  GBS positive, on penicillin Anticipated MOD:  NSVD  Beverely Lowdamo, Elena 09/07/2013, 4:17 AM  I have seen and examined this patient and agree with above documentation in the resident's note.   Rulon AbideKeli Brennin Durfee, M.D. [facility] 09/07/2013 4:26 AM

## 2013-09-07 NOTE — Progress Notes (Addendum)
Patient ID: Jessica QuailRachel N [name], female   DOB: 11/28/1990, 23 y.o.   MRN: [id number] Jessica [name] is a 23 y.o. G1P0 at 4063w3d admitted for Cervical ripening/[facility]  Subjective: Comfortable. Foley bulb fell out this past hour. No H/A  Objective: BP 121/67  Pulse 91  Temp(Src) 98.3 F (36.8 C) (Oral)  Resp 18  Ht 5\' 4"  (1.626 m)  Wt 87.091 kg (192 lb)  BMI 32.94 kg/m2  SpO2 99%  LMP 12/05/2012 Filed Vitals:   09/07/13 0717 09/07/13 0857 09/07/13 1322 09/07/13 1623  BP:  124/62 114/51 121/67  Pulse: 122 98 88 91  Temp:  98.1 F (36.7 C) 97.9 F (36.6 C) 97.9 F (36.6 C)  TempSrc:  Oral Oral Oral  Resp:  18 18 18   Height:      Weight:      SpO2:       Fetal Heart FHR: 135 bpm, variability: moderate,  accelerations:  Present,  decelerations:  Absent   Contractions: UI  SVE:   Dilation: 4 Effacement (%): 50 Station: -3;-2 Exam by:: Jessica [name], CNM  Assessment / Plan:  Labor:Favorable cx after FB> start pitocin IOL. Recent BPs WNL Fetal Wellbeing: Category 1 Pain Control:  N/a Wants epidural later Expected mode of delivery: NSVD  Jessica [name] 09/07/2013, 6:39 PM

## 2013-09-07 NOTE — MAU Note (Signed)
PT SAYS SHE IS HAVING UC'S.    WAS 1/2 CM IN OFFICE YESTERDAY.  DENIES  SROM,  BLEEDING,   ,   HSV AND MRSA.

## 2013-09-07 NOTE — Progress Notes (Signed)
Dr. Jean RosenthalJackson in, discussed procedure with patient and pt chooses to wait a while before proceeding.

## 2013-09-07 NOTE — Progress Notes (Signed)
Patient ID: Jessica [name], female   DOB: 1991-06-02, 23 y.o.   MRN: [id number] Jessica [name] is a 23 y.o. G1P0 at [redacted]w[redacted]d admitted for IOL indicated by [facility] Subjective: Comfortable. Denies H/A, visual disturbance, abdominal pain. FB in.   Objective: BP 124/62  Pulse 98  Temp(Src) 98.1 F (36.7 C) (Oral)  Resp 18  Ht 5\' 4"  (1.626 m)  Wt 87.091 kg (192 lb)  BMI 32.94 kg/m2  SpO2 99%  LMP 12/05/2012 Filed Vitals:   09/07/13 0552 09/07/13 0650 09/07/13 0717 09/07/13 0857  BP: 150/97 138/87  124/62  Pulse: 103 114 122 98  Temp:      TempSrc:      Resp: 18 18  18   Height:      Weight:      SpO2:       Results for orders placed during the hospital encounter of 09/07/13 (from the past 24 hour(s))  PROTEIN / CREATININE RATIO, URINE     Status: Abnormal   Collection Time    09/07/13  1:30 AM      Result Value Range   Creatinine, Urine 56.11     Total Protein, Urine 10.8     PROTEIN CREATININE RATIO 0.19 (*) 0.00 - 0.15  COMPREHENSIVE METABOLIC PANEL     Status: Abnormal   Collection Time    09/07/13  1:40 AM      Result Value Range   Sodium 135 (*) 137 - 147 mEq/L   Potassium 4.7  3.7 - 5.3 mEq/L   Chloride 102  96 - 112 mEq/L   CO2 17 (*) 19 - 32 mEq/L   Glucose, Bld 90  70 - 99 mg/dL   BUN 5 (*) 6 - 23 mg/dL   Creatinine, Ser 4.09  0.50 - 1.10 mg/dL   Calcium 9.2  8.4 - 81.1 mg/dL   Total Protein 6.6  6.0 - 8.3 g/dL   Albumin 3.0 (*) 3.5 - 5.2 g/dL   AST 49 (*) 0 - 37 U/L   ALT 20  0 - 35 U/L   Alkaline Phosphatase 175 (*) 39 - 117 U/L   Total Bilirubin 0.2 (*) 0.3 - 1.2 mg/dL   GFR calc non Af Amer >90  >90 mL/min   GFR calc Af Amer >90  >90 mL/min  CBC     Status: Abnormal   Collection Time    09/07/13  1:40 AM      Result Value Range   WBC 15.0 (*) 4.0 - 10.5 K/uL   RBC 4.60  3.87 - 5.11 MIL/uL   Hemoglobin 13.2  12.0 - 15.0 g/dL   HCT 91.4  78.2 - 95.6 %   MCV 81.3  78.0 - 100.0 fL   MCH 28.7  26.0 - 34.0 pg   MCHC 35.3  30.0 - 36.0 g/dL   RDW 21.3  08.6 -  57.8 %   Platelets 238  150 - 400 K/uL  RPR     Status: None   Collection Time    09/07/13  1:40 AM      Result Value Range   RPR NON REACTIVE  NON REACTIVE   Fetal Heart FHR: 125 bpm, variability: moderate,  accelerations:  Present,  decelerations:  Absent   Contractions: slight UI  SVE:   Dilation: 1 Effacement (%): 40 Station: -1 Exam by:: Dr. Richarda Blade Not rechecked  Assessment / Plan:  Labor: Rose Fillers IOL. GHTN stable with borderline AST elevation> will follow Fetal Wellbeing: Category 1 Pain  Control:  n/a Expected mode of delivery: NSVD  Rand Boller 09/07/2013, 11:44 AM

## 2013-09-07 NOTE — H&P (Signed)
Jessica [name] is a 23 y.o. female G1 @ 5431w3d presenting for painful contractions and found to have newly elevated blood pressures. Pt was seen this morning and was contracting irregularly without cervical change and was sent home. She has continued to contract since then and returned this evening because of worsening pain with contractions.  Maternal Medical History:  Reason for admission: Contractions.  New onset gestational hypertension  Contractions: Onset was 13-24 hours ago.   Frequency: irregular.   Perceived severity is mild.    Fetal activity: Perceived fetal activity is normal.   Last perceived fetal movement was within the past hour.    Prenatal complications: PIH.   Prenatal Complications - Diabetes: none.    OB History   Grav Para Term Preterm Abortions TAB SAB Ect Mult Living   1              Past Medical History  Diagnosis Date  . Abnormal Pap smear   . UTI (lower urinary tract infection) 01/12/2013  . Pregnant   . ADHD (attention deficit hyperactivity disorder)    Past Surgical History  Procedure Laterality Date  . Tooth extraction     Family History: family history includes Diabetes in her maternal grandmother and paternal grandmother. Social History:  reports that she has quit smoking. Her smoking use included Cigarettes. She smoked 0.00 packs per day. She has never used smokeless tobacco. She reports that she does not drink alcohol or use illicit drugs.   Prenatal Transfer Tool  Maternal Diabetes: No Genetic Screening: Normal Maternal Ultrasounds/Referrals: Normal Fetal Ultrasounds or other Referrals:  None Maternal Substance Abuse:  No Significant Maternal Medications:  None Significant Maternal Lab Results:  Lab values include: Group B Strep positive, Rh negative, Other: D antibody positive Other Comments:  None  Review of Systems  Eyes: Negative for blurred vision.  Gastrointestinal: Positive for abdominal pain.  Neurological: Negative for  headaches.  All other systems reviewed and are negative.   Dilation: Fingertip Effacement (%): 70 Station: -1 Exam by:: D Simpson RN Blood pressure 145/89, pulse 110, temperature 97.3 F (36.3 C), temperature source Oral, resp. rate 18, last menstrual period 12/05/2012, SpO2 99.00%. Maternal Exam:  Uterine Assessment: Contraction strength is moderate.  Contraction duration is 60 seconds. Contraction frequency is irregular.   Introitus: Normal vulva. Normal vagina.  Pelvis: adequate for delivery.   Cervix: Cervix evaluated by digital exam.     Fetal Exam Fetal Monitor Review: Mode: ultrasound.   Baseline rate: 130.  Variability: moderate (6-25 bpm).   Pattern: accelerations present and no decelerations.    Fetal State Assessment: Category I - tracings are normal.     Physical Exam  Nursing note and vitals reviewed. Constitutional: She is oriented to person, place, and time. She appears well-developed and well-nourished. No distress.  HENT:  Head: Normocephalic and atraumatic.  Eyes: Conjunctivae are normal. Right eye exhibits no discharge. Left eye exhibits no discharge. No scleral icterus.  Neck: Normal range of motion.  Cardiovascular: Normal rate, regular rhythm and normal heart sounds.   No murmur heard. Respiratory: Effort normal and breath sounds normal. No respiratory distress. She has no wheezes.  GI: Soft. She exhibits no distension. There is no tenderness.  Musculoskeletal: Normal range of motion. She exhibits edema. She exhibits no tenderness.  Neurological: She is alert and oriented to person, place, and time. She has normal reflexes.  No clonus  Skin: Skin is warm and dry. No rash noted. She is not diaphoretic.  Psychiatric: She has a normal mood and affect. Her behavior is normal.    Prenatal labs: ABO, Rh: --/--/A NEG (01/10 2212) Antibody: POS (11/11 0955) Rubella: 2.55 (07/01 1138) RPR: NON REAC (11/11 0955)  HBsAg: NEGATIVE (07/01 1138)  HIV: NON  REACTIVE (11/11 0955)  GBS:   positive  Assessment/Plan: Pt is a 23 y.o. G1P0 at [redacted]w[redacted]d who presents with newly elevated blood pressures at term. UPC 0.19. gHTN, no symptoms or other severe features. Consider starting mag if develops symptoms or severe range pressures.  Labor Plan #Labor: contracting regularly for the past 30 minutes. Will recheck in 1 hour and give cytotec if no change #Pain: epidural on request #FWB: category 1 #ID: GBS positive, penicillin G ordered #MOC: OCPs #MOF: breast/bottle #Circ: plans outpatient   Beverely Low 09/07/2013, 2:04 AM   I have seen and examined this patient and agree with above documentation in the resident's note. 23 yo G1 @ [redacted]w[redacted]d here for contractions with minimal cervical change but new diagnosis of gHTN at term. Admit and augment with either FB or cytotec initially. Agree with above otherwise.  FWB- Cat I tracing  Rulon Abide, M.D. [facility] Fellow 09/07/2013 4:06 AM

## 2013-09-07 NOTE — Progress Notes (Signed)
Jessica [name] is a 23 y.o. G1P0 at 5747w3d admitted for induction of labor due to Hypertension.  Subjective: Reports not feeling her contractions. Complains of itching, likely from fentanyl but doesn't want benadryl.   Objective: BP 133/84  Pulse 109  Temp(Src) 98.2 F (36.8 C) (Oral)  Resp 16  Ht 5\' 4"  (1.626 m)  Wt 87.091 kg (192 lb)  BMI 32.94 kg/m2  SpO2 99%  LMP 12/05/2012     FHT:  FHR: 130 bpm, variability: moderate,  accelerations:  Present,  decelerations:  Absent UC:   irregular, every 2-6 minutes SVE:   Dilation: 4.5 Effacement (%): 50 Station: -3;-2 Exam by:: L.Stubbs, RN  Labs: Lab Results  Component Value Date   WBC 15.0* 09/07/2013   HGB 13.2 09/07/2013   HCT 37.4 09/07/2013   MCV 81.3 09/07/2013   PLT 238 09/07/2013    Assessment / Plan: Induction of labor due to gestational hypertension,  progressing well on pitocin  Labor: Progressing on Pitocin, will continue to increase then AROM Preeclampsia:  no signs or symptoms of toxicity Fetal Wellbeing:  Category I Pain Control:  Fentanyl I/D:  n/a Anticipated MOD:  NSVD  Beverely Lowdamo, Aileene Lanum 09/07/2013, 10:51 PM

## 2013-09-07 NOTE — Progress Notes (Signed)
Pt off monitors to ambulate per MD orders--pt denies pain or UCs at this time

## 2013-09-08 ENCOUNTER — Encounter (HOSPITAL_COMMUNITY): Payer: Medicaid Other | Admitting: Anesthesiology

## 2013-09-08 ENCOUNTER — Inpatient Hospital Stay (HOSPITAL_COMMUNITY): Payer: Medicaid Other | Admitting: Anesthesiology

## 2013-09-08 ENCOUNTER — Encounter (HOSPITAL_COMMUNITY)
Admission: AD | Disposition: A | Discharge status: Home / Self Care | Payer: Self-pay | Source: Ambulatory Visit | Attending: Obstetrics & Gynecology

## 2013-09-08 ENCOUNTER — Encounter (HOSPITAL_COMMUNITY): Payer: Self-pay | Admitting: Anesthesiology

## 2013-09-08 DIAGNOSIS — O99344 Other mental disorders complicating childbirth: Secondary | ICD-10-CM

## 2013-09-08 DIAGNOSIS — O139 Gestational [pregnancy-induced] hypertension without significant proteinuria, unspecified trimester: Secondary | ICD-10-CM

## 2013-09-08 LAB — CBC
HCT: 35.8 % — ABNORMAL LOW (ref 36.0–46.0)
Hemoglobin: 12.3 g/dL (ref 12.0–15.0)
MCH: 28.4 pg (ref 26.0–34.0)
MCHC: 34.4 g/dL (ref 30.0–36.0)
MCV: 82.7 fL (ref 78.0–100.0)
Platelets: 218 10*3/uL (ref 150–400)
RBC: 4.33 MIL/uL (ref 3.87–5.11)
RDW: 15.2 % (ref 11.5–15.5)
WBC: 10.7 10*3/uL — ABNORMAL HIGH (ref 4.0–10.5)

## 2013-09-08 SURGERY — Surgical Case
Anesthesia: Epidural | Site: Abdomen

## 2013-09-08 MED ORDER — LIDOCAINE HCL (PF) 1 % IJ SOLN
INTRAMUSCULAR | Status: DC | PRN
  Administered 2013-09-08 (×2): 5 mL

## 2013-09-08 MED ORDER — BUPIVACAINE HCL (PF) 0.5 % IJ SOLN
INTRAMUSCULAR | Status: AC
  Filled 2013-09-08: qty 30

## 2013-09-08 MED ORDER — MORPHINE SULFATE (PF) 0.5 MG/ML IJ SOLN
INTRAMUSCULAR | Status: DC | PRN
  Administered 2013-09-08: 2 mg via INTRAVENOUS
  Administered 2013-09-08: 3 mg via EPIDURAL

## 2013-09-08 MED ORDER — OXYTOCIN 10 UNIT/ML IJ SOLN
40.0000 [IU] | INTRAVENOUS | Status: DC | PRN
  Administered 2013-09-08: 40 [IU] via INTRAVENOUS

## 2013-09-08 MED ORDER — LIDOCAINE-EPINEPHRINE (PF) 2 %-1:200000 IJ SOLN
INTRAMUSCULAR | Status: AC
  Filled 2013-09-08: qty 20

## 2013-09-08 MED ORDER — BUPIVACAINE HCL (PF) 0.5 % IJ SOLN
INTRAMUSCULAR | Status: DC | PRN
  Administered 2013-09-08: 30 mL

## 2013-09-08 MED ORDER — LACTATED RINGERS IV SOLN
INTRAVENOUS | Status: DC | PRN
  Administered 2013-09-08: 23:00:00 via INTRAVENOUS

## 2013-09-08 MED ORDER — DEXTROSE 5 % IV SOLN
2.0000 g | Freq: Once | INTRAVENOUS | Status: AC
  Administered 2013-09-08: 2 g via INTRAVENOUS
  Filled 2013-09-08: qty 2

## 2013-09-08 MED ORDER — ONDANSETRON HCL 4 MG/2ML IJ SOLN
INTRAMUSCULAR | Status: DC | PRN
  Administered 2013-09-08: 4 mg via INTRAVENOUS

## 2013-09-08 MED ORDER — SODIUM BICARBONATE 8.4 % IV SOLN
INTRAVENOUS | Status: DC | PRN
  Administered 2013-09-08 (×4): 5 mL via EPIDURAL

## 2013-09-08 MED ORDER — OXYTOCIN 10 UNIT/ML IJ SOLN
INTRAMUSCULAR | Status: AC
  Filled 2013-09-08: qty 4

## 2013-09-08 MED ORDER — ONDANSETRON HCL 4 MG/2ML IJ SOLN
INTRAMUSCULAR | Status: AC
  Filled 2013-09-08: qty 2

## 2013-09-08 MED ORDER — LACTATED RINGERS IV SOLN
INTRAVENOUS | Status: DC | PRN
  Administered 2013-09-08 (×3): via INTRAVENOUS

## 2013-09-08 MED ORDER — MORPHINE SULFATE 0.5 MG/ML IJ SOLN
INTRAMUSCULAR | Status: AC
  Filled 2013-09-08: qty 10

## 2013-09-08 MED ORDER — SODIUM BICARBONATE 8.4 % IV SOLN
INTRAVENOUS | Status: AC
  Filled 2013-09-08: qty 50

## 2013-09-08 SURGICAL SUPPLY — 34 items
CLAMP CORD UMBIL (MISCELLANEOUS) IMPLANT
CLOTH BEACON ORANGE TIMEOUT ST (SAFETY) ×2 IMPLANT
DRAPE LG THREE QUARTER DISP (DRAPES) IMPLANT
DRSG OPSITE POSTOP 4X10 (GAUZE/BANDAGES/DRESSINGS) ×2 IMPLANT
DURAPREP 26ML APPLICATOR (WOUND CARE) ×2 IMPLANT
ELECT REM PT RETURN 9FT ADLT (ELECTROSURGICAL) ×2
ELECTRODE REM PT RTRN 9FT ADLT (ELECTROSURGICAL) ×1 IMPLANT
EXTRACTOR VACUUM M CUP 4 TUBE (SUCTIONS) IMPLANT
GLOVE BIO SURGEON STRL SZ7 (GLOVE) ×2 IMPLANT
GLOVE BIOGEL PI IND STRL 7.0 (GLOVE) ×1 IMPLANT
GLOVE BIOGEL PI INDICATOR 7.0 (GLOVE) ×1
GOWN STRL REUS W/ TWL XL LVL3 (GOWN DISPOSABLE) ×1 IMPLANT
GOWN STRL REUS W/TWL LRG LVL3 (GOWN DISPOSABLE) ×2 IMPLANT
GOWN STRL REUS W/TWL XL LVL3 (GOWN DISPOSABLE) ×1
KIT ABG SYR 3ML LUER SLIP (SYRINGE) IMPLANT
NEEDLE HYPO 22GX1.5 SAFETY (NEEDLE) ×2 IMPLANT
NEEDLE HYPO 25X5/8 SAFETYGLIDE (NEEDLE) ×2 IMPLANT
NS IRRIG 1000ML POUR BTL (IV SOLUTION) ×2 IMPLANT
PACK C SECTION WH (CUSTOM PROCEDURE TRAY) ×2 IMPLANT
PAD ABD 7.5X8 STRL (GAUZE/BANDAGES/DRESSINGS) ×2 IMPLANT
PAD OB MATERNITY 4.3X12.25 (PERSONAL CARE ITEMS) ×2 IMPLANT
RTRCTR C-SECT PINK 25CM LRG (MISCELLANEOUS) ×2 IMPLANT
STAPLER VISISTAT 35W (STAPLE) IMPLANT
SUT PDS AB 0 CT1 27 (SUTURE) IMPLANT
SUT PDS AB 0 CTX 36 PDP370T (SUTURE) IMPLANT
SUT PLAIN 2 0 XLH (SUTURE) ×2 IMPLANT
SUT VIC AB 0 CT1 36 (SUTURE) ×4 IMPLANT
SUT VIC AB 0 CTX 36 (SUTURE) ×2
SUT VIC AB 0 CTX36XBRD ANBCTRL (SUTURE) ×2 IMPLANT
SUT VIC AB 4-0 KS 27 (SUTURE) ×2 IMPLANT
SYR 30ML LL (SYRINGE) ×2 IMPLANT
TOWEL OR 17X24 6PK STRL BLUE (TOWEL DISPOSABLE) ×2 IMPLANT
TRAY FOLEY CATH 14FR (SET/KITS/TRAYS/PACK) ×2 IMPLANT
WATER STERILE IRR 1000ML POUR (IV SOLUTION) ×2 IMPLANT

## 2013-09-08 NOTE — Progress Notes (Signed)
Jessica [name] is a 23 y.o. G1P0 at 8434w3d admitted for induction of labor due to Hypertension.  Subjective: Pit d/c'd at 0600, no ctx, pt sleeping   Objective: BP 111/46  Pulse 77  Temp(Src) 97.9 F (36.6 C) (Oral)  Resp 20  Ht 5\' 4"  (1.626 m)  Wt 87.091 kg (192 lb)  BMI 32.94 kg/m2  SpO2 99%  LMP 12/05/2012     FHT:  FHR: 140s bpm, variability: moderate,  accelerations:  Present,  decelerations:  Absent UC:   irregular, and rare SVE:   Dilation: 4.5 Effacement (%): 40 Station: -2 Exam by:: Dr. Ike Benedom  Labs: Lab Results  Component Value Date   WBC 15.0* 09/07/2013   HGB 13.2 09/07/2013   HCT 37.4 09/07/2013   MCV 81.3 09/07/2013   PLT 238 09/07/2013    Assessment / Plan: Induction of labor due to gestational hypertension,  No change off pit will restart Labor: Restart pit at this time, recheck and consider AROM if thinning out Preeclampsia:  no signs or symptoms of toxicity, BPs improved Fetal Wellbeing:  Category I Pain Control:  Fentanyl I/D:  n/a Anticipated MOD:  NSVD  Raisha Brabender RYAN 09/08/2013, 10:05 AM

## 2013-09-08 NOTE — Progress Notes (Signed)
Shaw CNM updated on FHR, UC pattern, SVE, and pt comfort level.  Orders given to stop pitocin for 4 hours and restart at 1000 per orders.  Allow pt to eat regular diet breakfast and shower if desired.

## 2013-09-08 NOTE — Anesthesia Procedure Notes (Signed)
Epidural Patient location during procedure: OB Start time: 09/08/2013 4:25 PM  Staffing Anesthesiologist: Brayton CavesJACKSON, Annely Sliva Performed by: anesthesiologist   Preanesthetic Checklist Completed: patient identified, site marked, surgical consent, pre-op evaluation, timeout performed, IV checked, risks and benefits discussed and monitors and equipment checked  Epidural Patient position: sitting Prep: site prepped and draped and DuraPrep Patient monitoring: continuous pulse ox and blood pressure Approach: midline Injection technique: LOR air  Needle:  Needle type: Tuohy  Needle gauge: 17 G Needle length: 9 cm and 9 Needle insertion depth: 7 cm Catheter type: closed end flexible Catheter size: 19 Gauge Catheter at skin depth: 12 cm Test dose: negative  Assessment Events: blood not aspirated, injection not painful, no injection resistance, negative IV test and no paresthesia  Additional Notes Patient identified.  Risk benefits discussed including failed block, incomplete pain control, headache, nerve damage, paralysis, blood pressure changes, nausea, vomiting, reactions to medication both toxic or allergic, and postpartum back pain.  Patient expressed understanding and wished to proceed.  All questions were answered.  Sterile technique used throughout procedure and epidural site dressed with sterile barrier dressing. No paresthesia or other complications noted.The patient did not experience any signs of intravascular injection such as tinnitus or metallic taste in mouth nor signs of intrathecal spread such as rapid motor block. Please see nursing notes for vital signs.

## 2013-09-08 NOTE — Consult Note (Addendum)
Neonatology Note:  Attendance at C-section:  I was asked by Dr. Anyanwu to attend this primary C/S at term due to FTP. The mother is a G1P0 A neg, GBS pos with a history of depression, ADHD, and gestational HTN. She has a weakly positive anti-D antibody, possibly consistent with recent Rhogam injection. ROM 8 hours prior to delivery, fluid clear. The patient received Pen G > hours before delivery and was afebrile during labor. Infant with good HR and tone throughout, but dusky color. He breathed, but did not cry vigorously more than once or twice despite stimulation. Needed only minimal bulb suctioning. Ap 7/9. Lungs clear to ausc in DR. O2 saturation normal at 4-5 minutes. To CN to care of Pediatrician.  Denae Zulueta C. Alexandra Lipps, MD  

## 2013-09-08 NOTE — Progress Notes (Signed)
   Jessica [name] is a 23 y.o. G1P0 at 363w4d  admitted for induction of labor due to Hypertension.  Subjective: Comfortable with epidural  Objective: BP 119/67  Pulse 106  Temp(Src) 97.9 F (36.6 C) (Oral)  Resp 20  Ht 5\' 4"  (1.626 m)  Wt 87.091 kg (192 lb)  BMI 32.94 kg/m2  SpO2 100%  LMP 12/05/2012    FHT:  FHR: 140 bpm, variability: moderate,  accelerations:  Present,  decelerations:  Absent UC:   MVU's ~ 220 SVE:   Dilation: 6 Effacement (%): 90 Station: -2 Exam by:: F. Cresenzo-Dishmon, CNM Pitocin @ 16 mu/min  Labs: Lab Results  Component Value Date   WBC 10.7* 09/08/2013   HGB 12.3 09/08/2013   HCT 35.8* 09/08/2013   MCV 82.7 09/08/2013   PLT 218 09/08/2013    Assessment / Plan: Induction of labor due to gestational hypertension,  progressing well on pitocin  Labor: Progressing normally Fetal Wellbeing:  Category I Pain Control:  Epidural Anticipated MOD:  NSVD  CRESENZO-DISHMAN,Jessica [name] 09/08/2013, 7:08 PM

## 2013-09-08 NOTE — Transfer of Care (Signed)
Immediate Anesthesia Transfer of Care Note  Patient: Jessica [name]  Procedure(s) Performed: Procedure(s): CESAREAN SECTION (N/A)  Patient Location: PACU  Anesthesia Type:Epidural  Level of Consciousness: awake, alert  and oriented  Airway & Oxygen Therapy: Patient Spontanous Breathing  Post-op Assessment: Report given to PACU RN  Post vital signs: Reviewed  Complications: No apparent anesthesia complications

## 2013-09-08 NOTE — Progress Notes (Signed)
I have seen and examined this patient and I agree with the above. Cam HaiSHAW, Caprice Mccaffrey 12:42 AM 09/08/2013

## 2013-09-08 NOTE — Anesthesia Postprocedure Evaluation (Signed)
Anesthesia Post Note  Patient: Jessica [name]  Procedure(s) Performed: Procedure(s) (LRB): CESAREAN SECTION (N/A)  Anesthesia type: Epidural  Patient location: PACU  Post pain: Pain level controlled  Post assessment: Post-op Vital signs reviewed  Last Vitals:  Filed Vitals:   09/08/13 2204  BP: 138/83  Pulse: 123  Temp: 36.9 C  Resp: 18    Post vital signs: Reviewed  Level of consciousness: awake  Complications: No apparent anesthesia complications

## 2013-09-08 NOTE — Progress Notes (Signed)
Faculty Practice Attending Note Jessica [name] is [name] 23 y.o. G1P0 at 6228w4d  admitted for induction of labor due to Hypertension.  Subjective: Comfortable with epidural  Objective: BP 149/85  Pulse 109  Temp(Src) 97.8 Jessica (36.6 C) (Oral)  Resp 18  Ht 5\' 4"  (1.626 m)  Wt 192 lb (87.091 kg)  BMI 32.94 kg/m2  SpO2 100%  LMP 12/05/2012 Total I/O In: -  Out: 600 [Urine:600]  FHT:  FHR: 140 bpm, variability: moderate,  accelerations:  Present,  decelerations:  Absent UC:   MVU's ~ 220 SVE:   Dilation: 6 Effacement (%): 90 Station: -1 Exam by:: Jessica [name] CNM Pitocin @ 16 mu/min  Labs: Lab Results  Component Value Date   WBC 10.7* 09/08/2013   HGB 12.3 09/08/2013   HCT 35.8* 09/08/2013   MCV 82.7 09/08/2013   PLT 218 09/08/2013    Assessment / Plan: Patient is exhibiting failure to progress:arrest of cervical dilation.  No significant cervical change since 1630 despite adequate contractions.  Recommended cesarean delivery.  The risks of cesarean section discussed with the patient included but were not limited to: bleeding which may require transfusion or reoperation; infection which may require antibiotics; injury to bowel, bladder, ureters or other surrounding organs; injury to the fetus; need for additional procedures including hysterectomy in the event of [name] life-threatening hemorrhage; placental abnormalities wth subsequent pregnancies, incisional problems, thromboembolic phenomenon and other postoperative/anesthesia complications. The patient concurred with the proposed plan, giving informed written consent for the procedure.   Preoperative prophylactic antibiotics and SCDs ordered on call to the OR.  To OR when ready.   Jessica [name] 09/08/2013, 10:17 PM

## 2013-09-08 NOTE — Anesthesia Preprocedure Evaluation (Signed)
Anesthesia Evaluation  Patient identified by MRN, date of birth, ID band Patient awake    Reviewed: Allergy & Precautions, H&P , Patient's Chart, lab work & pertinent test results  Airway Mallampati: II TM Distance: >3 FB Neck ROM: full    Dental   Pulmonary former smoker,  breath sounds clear to auscultation        Cardiovascular hypertension, Rhythm:regular Rate:Normal     Neuro/Psych    GI/Hepatic   Endo/Other    Renal/GU      Musculoskeletal   Abdominal   Peds  Hematology   Anesthesia Other Findings   Reproductive/Obstetrics (+) Pregnancy                           Anesthesia Physical Anesthesia Plan  ASA: III  Anesthesia Plan: Epidural   Post-op Pain Management:    Induction:   Airway Management Planned:   Additional Equipment:   Intra-op Plan:   Post-operative Plan:   Informed Consent: I have reviewed the patients History and Physical, chart, labs and discussed the procedure including the risks, benefits and alternatives for the proposed anesthesia with the patient or authorized representative who has indicated his/her understanding and acceptance.     Plan Discussed with:   Anesthesia Plan Comments:         Anesthesia Quick Evaluation  

## 2013-09-08 NOTE — Op Note (Signed)
Jessica [name] PROCEDURE DATE: 09/08/2013  PREOPERATIVE DIAGNOSES: Intrauterine pregnancy at  703w4d weeks gestation; failure to progress: arrest of dilation  POSTOPERATIVE DIAGNOSES: The same  PROCEDURE: Primary Transverse Cesarean Section  SURGEON:  Dr. Jaynie CollinsUgonna Bellami Farrelly  ANESTHESIOLOGIST: Dr. Norina BuzzardJohn Hatchett  INDICATIONS: Jessica QuailRachel N [name] is a 23 y.o. G1P0 at 613w4d here for cesarean section secondary to the indications listed under preoperative diagnoses; please see preoperative note for further details.  The risks of cesarean section were discussed with the patient including but were not limited to: bleeding which may require transfusion or reoperation; infection which may require antibiotics; injury to bowel, bladder, ureters or other surrounding organs; injury to the fetus; need for additional procedures including hysterectomy in the event of a life-threatening hemorrhage; placental abnormalities wth subsequent pregnancies, incisional problems, thromboembolic phenomenon and other postoperative/anesthesia complications.   The patient concurred with the proposed plan, giving informed written consent for the procedure.    FINDINGS:  Viable female infant in cephalic presentation.  Apgars 7 and 9.  Clear amniotic fluid.  Intact placenta, three vessel cord.  Normal uterus, fallopian tubes and ovaries bilaterally.  ANESTHESIA:  Epidural INTRAVENOUS FLUIDS: 2500 ml ESTIMATED BLOOD LOSS: 700 ml URINE OUTPUT:  100 ml SPECIMENS: Placenta sent to pathology COMPLICATIONS: None immediate  PROCEDURE IN DETAIL:  The patient preoperatively received intravenous antibiotics and had sequential compression devices applied to her lower extremities.  She was then taken to the operating room where the epidural anesthesia was dosed up to surgical level and was found to be adequate. She was then placed in a dorsal supine position with a leftward tilt, and prepped and draped in a sterile manner.  A foley catheter was  placed into her bladder and attached to constant gravity.  After an adequate timeout was performed, a Pfannenstiel skin incision was made with scalpel and carried through to the underlying layer of fascia. The fascia was incised in the midline, and this incision was extended bilaterally using the Mayo scissors.  Kocher clamps were applied to the superior aspect of the fascial incision and the underlying rectus muscles were dissected off bluntly. A similar process was carried out on the inferior aspect of the fascial incision. The rectus muscles were separated in the midline bluntly and the peritoneum was entered bluntly. Attention was turned to the lower uterine segment where a low transverse hysterotomy was made with a scalpel and extended bilaterally bluntly.  The infant was successfully delivered, the cord was clamped and cut and the infant was handed over to awaiting neonatology team. Uterine massage was then administered, and the placenta delivered intact with a three-vessel cord. The uterus was then cleared of clot and debris.  The hysterotomy was closed with 0 Vicryl in a running locked fashion, and an imbricating layer was also placed with 0 Vicryl. The pelvis was cleared of all clot and debris. Hemostasis was confirmed on all surfaces.  The peritoneum and the muscles were reapproximated using 0 Vicryl interrupted stitches. The fascia was then closed using 0 Vicryl in a running fashion.  The subcutaneous layer was irrigated, then reapproximated with 2-0 plain gut interrupted stitches, and 30 ml of 0.5% Marcaine was injected subcutaneously around the incision.  The skin was closed with a 4-0 Vicryl subcuticular stitch. The patient tolerated the procedure well. Sponge, lap, instrument and needle counts were correct x 2.  She was taken to the recovery room in stable condition.   Jessica CollinsUGONNA  Nemiah Bubar, MD, FACOG Attending Obstetrician & Gynecologist Faculty Practice,  [facility]

## 2013-09-08 NOTE — Progress Notes (Signed)
Jessica [name] is a 23 y.o. G1P0 at 5274w3d admitted for induction of labor due to Hypertension.  Subjective: pt having painful ctx and now had SROM with this check at ~1445   Objective: BP 114/60  Pulse 85  Temp(Src) 97.9 F (36.6 C) (Oral)  Resp 20  Ht 5\' 4"  (1.626 m)  Wt 87.091 kg (192 lb)  BMI 32.94 kg/m2  SpO2 99%  LMP 12/05/2012     FHT:  FHR: 130s bpm, variability: moderate,  accelerations:  Present,  decelerations:  Present earlies UC:   q3-5 SVE:   Dilation: 5 Effacement (%): 60 Station: -1 Exam by:: Dr. Ike Benedom  Labs: Lab Results  Component Value Date   WBC 15.0* 09/07/2013   HGB 13.2 09/07/2013   HCT 37.4 09/07/2013   MCV 81.3 09/07/2013   PLT 238 09/07/2013    Assessment / Plan: Induction of labor due to gestational hypertension,  Pit now with SROM on this check @ 1445, IUPC placed Labor: IUPC placed, titrate pit to adequate ctx Preeclampsia:  no signs or symptoms of toxicity, BPs improved Fetal Wellbeing:  Category I Pain Control:  Fentanyl and may have epidural PRN I/D:  n/a Anticipated MOD:  NSVD  Uriah Trueba RYAN 09/08/2013, 3:01 PM

## 2013-09-09 ENCOUNTER — Encounter (HOSPITAL_COMMUNITY): Payer: Self-pay | Admitting: *Deleted

## 2013-09-09 LAB — CBC
HCT: 33 % — ABNORMAL LOW (ref 36.0–46.0)
Hemoglobin: 11.1 g/dL — ABNORMAL LOW (ref 12.0–15.0)
MCH: 28 pg (ref 26.0–34.0)
MCHC: 33.6 g/dL (ref 30.0–36.0)
MCV: 83.1 fL (ref 78.0–100.0)
Platelets: 219 10*3/uL (ref 150–400)
RBC: 3.97 MIL/uL (ref 3.87–5.11)
RDW: 15.5 % (ref 11.5–15.5)
WBC: 12.5 10*3/uL — ABNORMAL HIGH (ref 4.0–10.5)

## 2013-09-09 MED ORDER — MENTHOL 3 MG MT LOZG: 1.0000 | LOZENGE | OROMUCOSAL | Status: DC | PRN

## 2013-09-09 MED ORDER — SODIUM CHLORIDE 0.9 % IJ SOLN: 3.0000 mL | INTRAMUSCULAR | Status: DC | PRN

## 2013-09-09 MED ORDER — WITCH HAZEL-GLYCERIN EX PADS: 1.0000 "application " | MEDICATED_PAD | CUTANEOUS | Status: DC | PRN

## 2013-09-09 MED ORDER — NALBUPHINE HCL 10 MG/ML IJ SOLN
5.0000 mg | INTRAMUSCULAR | Status: DC | PRN
  Filled 2013-09-09: qty 1

## 2013-09-09 MED ORDER — KETOROLAC TROMETHAMINE 30 MG/ML IJ SOLN: 15.0000 mg | Freq: Once | INTRAMUSCULAR | Status: AC | PRN

## 2013-09-09 MED ORDER — MORPHINE SULFATE 10 MG/ML IJ SOLN
4.0000 mg | INTRAMUSCULAR | Status: DC | PRN
  Administered 2013-09-09: 4 mg via INTRAVENOUS
  Filled 2013-09-09: qty 1

## 2013-09-09 MED ORDER — DIPHENHYDRAMINE HCL 50 MG/ML IJ SOLN
12.5000 mg | INTRAMUSCULAR | Status: DC | PRN
  Administered 2013-09-09: 03:00:00 via INTRAVENOUS
  Filled 2013-09-09: qty 1

## 2013-09-09 MED ORDER — SCOPOLAMINE 1 MG/3DAYS TD PT72
MEDICATED_PATCH | TRANSDERMAL | Status: AC
  Filled 2013-09-09: qty 1

## 2013-09-09 MED ORDER — HYDROMORPHONE HCL PF 1 MG/ML IJ SOLN
INTRAMUSCULAR | Status: AC
  Filled 2013-09-09: qty 1

## 2013-09-09 MED ORDER — IBUPROFEN 600 MG PO TABS
600.0000 mg | ORAL_TABLET | Freq: Four times a day (QID) | ORAL | Status: DC | PRN
Start: 2013-09-09 — End: 2013-09-10

## 2013-09-09 MED ORDER — KETOROLAC TROMETHAMINE 30 MG/ML IJ SOLN
INTRAMUSCULAR | Status: AC
  Filled 2013-09-09: qty 1

## 2013-09-09 MED ORDER — ONDANSETRON HCL 4 MG/2ML IJ SOLN
4.0000 mg | INTRAMUSCULAR | Status: DC | PRN
Start: 2013-09-09 — End: 2013-09-10

## 2013-09-09 MED ORDER — RHO D IMMUNE GLOBULIN 1500 UNIT/2ML IJ SOLN
300.0000 ug | Freq: Once | INTRAMUSCULAR | Status: DC
  Filled 2013-09-09: qty 2

## 2013-09-09 MED ORDER — KETOROLAC TROMETHAMINE 30 MG/ML IJ SOLN
30.0000 mg | Freq: Four times a day (QID) | INTRAMUSCULAR | Status: AC | PRN
Start: 2013-09-09 — End: 2013-09-09
  Administered 2013-09-09: 30 mg via INTRAVENOUS
  Filled 2013-09-09: qty 1

## 2013-09-09 MED ORDER — DIBUCAINE 1 % RE OINT
1.0000 | TOPICAL_OINTMENT | RECTAL | Status: DC | PRN
Start: 2013-09-09 — End: 2013-09-10

## 2013-09-09 MED ORDER — PNEUMOCOCCAL VAC POLYVALENT 25 MCG/0.5ML IJ INJ
0.5000 mL | INJECTION | INTRAMUSCULAR | Status: DC
  Filled 2013-09-09: qty 0.5

## 2013-09-09 MED ORDER — MEPERIDINE HCL 25 MG/ML IJ SOLN
6.2500 mg | INTRAMUSCULAR | Status: DC | PRN
Start: 2013-09-08 — End: 2013-09-09

## 2013-09-09 MED ORDER — METOCLOPRAMIDE HCL 5 MG/ML IJ SOLN: 10.0000 mg | Freq: Three times a day (TID) | INTRAMUSCULAR | Status: DC | PRN

## 2013-09-09 MED ORDER — NALOXONE HCL 0.4 MG/ML IJ SOLN: 0.4000 mg | INTRAMUSCULAR | Status: DC | PRN

## 2013-09-09 MED ORDER — OXYCODONE-ACETAMINOPHEN 5-325 MG PO TABS
1.0000 | ORAL_TABLET | ORAL | Status: DC | PRN
  Administered 2013-09-09 – 2013-09-10 (×7): 2 via ORAL
  Filled 2013-09-09 (×7): qty 2

## 2013-09-09 MED ORDER — MEPERIDINE HCL 25 MG/ML IJ SOLN
6.2500 mg | INTRAMUSCULAR | Status: DC | PRN
  Administered 2013-09-09: 6.25 mg via INTRAVENOUS

## 2013-09-09 MED ORDER — DIPHENHYDRAMINE HCL 25 MG PO CAPS
25.0000 mg | ORAL_CAPSULE | Freq: Four times a day (QID) | ORAL | Status: DC | PRN
Start: 2013-09-09 — End: 2013-09-10

## 2013-09-09 MED ORDER — OXYTOCIN 40 UNITS IN LACTATED RINGERS INFUSION - SIMPLE MED: 62.5000 mL/h | INTRAVENOUS | Status: AC

## 2013-09-09 MED ORDER — TETANUS-DIPHTH-ACELL PERTUSSIS 5-2.5-18.5 LF-MCG/0.5 IM SUSP
0.5000 mL | Freq: Once | INTRAMUSCULAR | Status: AC
  Administered 2013-09-10: 0.5 mL via INTRAMUSCULAR
  Filled 2013-09-09: qty 0.5

## 2013-09-09 MED ORDER — SIMETHICONE 80 MG PO CHEW: 80.0000 mg | CHEWABLE_TABLET | ORAL | Status: DC | PRN

## 2013-09-09 MED ORDER — IBUPROFEN 600 MG PO TABS
600.0000 mg | ORAL_TABLET | Freq: Four times a day (QID) | ORAL | Status: DC
  Administered 2013-09-09 – 2013-09-10 (×4): 600 mg via ORAL
  Filled 2013-09-09 (×5): qty 1

## 2013-09-09 MED ORDER — KETOROLAC TROMETHAMINE 30 MG/ML IJ SOLN
30.0000 mg | Freq: Four times a day (QID) | INTRAMUSCULAR | Status: AC | PRN
Start: 2013-09-09 — End: 2013-09-09

## 2013-09-09 MED ORDER — HYDROMORPHONE HCL PF 1 MG/ML IJ SOLN
0.2500 mg | INTRAMUSCULAR | Status: DC | PRN
  Administered 2013-09-09 (×4): 0.5 mg via INTRAVENOUS

## 2013-09-09 MED ORDER — MAGNESIUM HYDROXIDE 400 MG/5ML PO SUSP: 30.0000 mL | ORAL | Status: DC | PRN

## 2013-09-09 MED ORDER — PRENATAL MULTIVITAMIN CH
1.0000 | ORAL_TABLET | Freq: Every day | ORAL | Status: DC
  Administered 2013-09-09 – 2013-09-10 (×2): 1 via ORAL
  Filled 2013-09-09: qty 1

## 2013-09-09 MED ORDER — MEPERIDINE HCL 25 MG/ML IJ SOLN
INTRAMUSCULAR | Status: AC
  Filled 2013-09-09: qty 1

## 2013-09-09 MED ORDER — NALOXONE HCL 1 MG/ML IJ SOLN
1.0000 ug/kg/h | INTRAVENOUS | Status: DC | PRN
  Filled 2013-09-09: qty 2

## 2013-09-09 MED ORDER — DIPHENHYDRAMINE HCL 25 MG PO CAPS
25.0000 mg | ORAL_CAPSULE | ORAL | Status: DC | PRN
  Administered 2013-09-09: 25 mg via ORAL

## 2013-09-09 MED ORDER — SCOPOLAMINE 1 MG/3DAYS TD PT72
1.0000 | MEDICATED_PATCH | Freq: Once | TRANSDERMAL | Status: DC
  Administered 2013-09-09: 1.5 mg via TRANSDERMAL

## 2013-09-09 MED ORDER — NALBUPHINE HCL 10 MG/ML IJ SOLN
5.0000 mg | INTRAMUSCULAR | Status: DC | PRN
  Administered 2013-09-09: 10 mg via INTRAVENOUS

## 2013-09-09 MED ORDER — ZOLPIDEM TARTRATE 5 MG PO TABS: 5.0000 mg | ORAL_TABLET | Freq: Every evening | ORAL | Status: DC | PRN

## 2013-09-09 MED ORDER — ONDANSETRON HCL 4 MG PO TABS
4.0000 mg | ORAL_TABLET | ORAL | Status: DC | PRN
Start: 2013-09-09 — End: 2013-09-10

## 2013-09-09 MED ORDER — SIMETHICONE 80 MG PO CHEW: 80.0000 mg | CHEWABLE_TABLET | ORAL | Status: DC

## 2013-09-09 MED ORDER — MEASLES, MUMPS & RUBELLA VAC ~~LOC~~ INJ
0.5000 mL | INJECTION | Freq: Once | SUBCUTANEOUS | Status: DC
  Filled 2013-09-09: qty 0.5

## 2013-09-09 MED ORDER — SENNOSIDES-DOCUSATE SODIUM 8.6-50 MG PO TABS: 2.0000 | ORAL_TABLET | ORAL | Status: DC

## 2013-09-09 MED ORDER — PROMETHAZINE HCL 25 MG/ML IJ SOLN: 6.2500 mg | INTRAMUSCULAR | Status: DC | PRN

## 2013-09-09 MED ORDER — LANOLIN HYDROUS EX OINT: 1.0000 "application " | TOPICAL_OINTMENT | CUTANEOUS | Status: DC | PRN

## 2013-09-09 MED ORDER — ONDANSETRON HCL 4 MG/2ML IJ SOLN: 4.0000 mg | Freq: Three times a day (TID) | INTRAMUSCULAR | Status: DC | PRN

## 2013-09-09 MED ORDER — DIPHENHYDRAMINE HCL 50 MG/ML IJ SOLN: 25.0000 mg | INTRAMUSCULAR | Status: DC | PRN

## 2013-09-09 MED ORDER — FENTANYL CITRATE 0.05 MG/ML IJ SOLN
100.0000 ug | Freq: Once | INTRAMUSCULAR | Status: AC
  Administered 2013-09-09: 100 ug via INTRAVENOUS

## 2013-09-09 NOTE — Anesthesia Postprocedure Evaluation (Signed)
  Anesthesia Post-op Note  Patient: Jessica QuailRachel N Mooers  Procedure(s) Performed: Procedure(s): CESAREAN SECTION (N/A)  Patient Location: PACU and Mother/Baby  Anesthesia Type:Epidural  Level of Consciousness: awake, alert  and oriented  Airway and Oxygen Therapy: Patient Spontanous Breathing  Post-op Pain: mild  Post-op Assessment: Patient's Cardiovascular Status Stable, Respiratory Function Stable, No signs of Nausea or vomiting, Adequate PO intake and Pain level controlled  Post-op Vital Signs: stable  Complications: No apparent anesthesia complications

## 2013-09-09 NOTE — Plan of Care (Signed)
Problem: Phase II Progression Outcomes Goal: Pain controlled on oral analgesia Outcome: Completed/Met Date Met:  09/09/13 Patient requesting 2 percocet as soon as they can be given, however patient is able to walk around the room smiling and talking after 4 hours from previous percocet dose and within 5 minutes of  the next percocet dose requested.  Other ways to control pain discussed.

## 2013-09-09 NOTE — Progress Notes (Signed)
Patient received Rho Gam on 08/20/13 and on 06/28/13.  Blood bank suggested holding dose for this evening, stating MD will decide if another dose is necessary or if these 2 doses are adequate coverage.  This information, in addition to this note, will be reported to the next RN to pass on to day shift.

## 2013-09-09 NOTE — Progress Notes (Signed)
Subjective: Postpartum Day 1: Cesarean Delivery Jessica [name] is doing well overall.  Bleeding is slightly more than a period.  C/o pain and soreness.  Ambulating well and tolerating PO. No BM yet, + Flatus. Choosing OCPs for MOC. Plans to breast-feed.   Objective: Vital signs in last 24 hours: Temp:  [97.4 F (36.3 C)-98.8 F (37.1 C)] 98.4 F (36.9 C) (01/30 0645) Pulse Rate:  [71-140] 96 (01/30 0645) Resp:  [11-20] 18 (01/30 0645) BP: (83-151)/(46-110) 134/82 mmHg (01/30 0645) SpO2:  [92 %-100 %] 95 % (01/30 0645)  Physical Exam:  General: alert, cooperative and no distress Lochia: appropriate Uterine Fundus: firm Incision: healing well, no significant drainage DVT Evaluation: No evidence of DVT seen on physical exam.   Recent Labs  09/08/13 1531 09/09/13 0525  HGB 12.3 11.1*  HCT 35.8* 33.0*    Assessment/Plan: Status post Cesarean section. Doing well postoperatively.  Continue current care. Discharge planning for 1-2 days.  TUCKER, BRITTON L 09/09/2013, 7:57 AM  I have seen and examined this patient and agree the above assessment. Jessica [name] 09/12/2013 2:56 PM

## 2013-09-09 NOTE — Progress Notes (Signed)
UR completed 

## 2013-09-09 NOTE — Progress Notes (Signed)
Patient was referred for history of depression/anxiety.  * Referral screened out by Clinical Social Worker because none of the following criteria appear to apply:  ~ History of anxiety/depression during this pregnancy, or of post-partum depression.  ~ Diagnosis of anxiety and/or depression within last 3 years, per pt.  ~ History of depression due to pregnancy loss/loss of child  OR  * Patient's symptoms currently being treated with medication and/or therapy.  Please contact the Clinical Social Worker if needs arise, or by the patient's request.  Pt did not seem interested in CSW services & appears frustrated. FOB thinks pt will feel better once she gets some rest, since she has not slept in 4 days. CSW is concerned about pt increased risk of PP depression & strongly encouraged her to seek medical attention if needed. Feelings After Birth brochure provided to pt as a resource.

## 2013-09-10 MED ORDER — OXYCODONE-ACETAMINOPHEN 5-325 MG PO TABS: 1.0000 | ORAL_TABLET | ORAL | Status: DC | PRN

## 2013-09-10 MED ORDER — IBUPROFEN 600 MG PO TABS: 600.0000 mg | ORAL_TABLET | Freq: Four times a day (QID) | ORAL | Status: DC

## 2013-09-10 NOTE — Progress Notes (Addendum)
Pt does not need additional dose of Rhogam at this admission per Cam HaiKimberly Shaw, CNM.  Pt switched to formula exclusively

## 2013-09-10 NOTE — Discharge Instructions (Signed)
Cesarean Delivery °Care After °Refer to this sheet in the next few weeks. These instructions provide you with information on caring for yourself after your procedure. Your health care provider may also give you specific instructions. Your treatment has been planned according to current medical practices, but problems sometimes occur. Call your health care provider if you have any problems or questions after you go home. °HOME CARE INSTRUCTIONS  °· Only take over-the-counter or prescription medications as directed by your health care provider. °· Do not drink alcohol, especially if you are breastfeeding or taking medication to relieve pain. °· Do not chew or smoke tobacco. °· Continue to use good perineal care. Good perineal care includes: °· Wiping your perineum from front to back. °· Keeping your perineum clean. °· Check your surgical cut (incision) daily for increased redness, drainage, swelling, or separation of skin. °· Clean your incision gently with soap and water every day, and then pat it dry. If your health care provider says it is OK, leave the incision uncovered. Use a bandage (dressing) if the incision is draining fluid or appears irritated. If the adhesive strips across the incision do not fall off within 7 days, carefully peel them off. °· Hug a pillow when coughing or sneezing until your incision is healed. This helps to relieve pain. °· Do not use tampons or douche until your health care provider says it is okay. °· Shower, wash your hair, and take tub baths as directed by your health care provider. °· Wear a well-fitting bra that provides breast support. °· Limit wearing support panties or control-top hose. °· Drink enough fluids to keep your urine clear or pale yellow. °· Eat high-fiber foods such as whole grain cereals and breads, brown rice, beans, and fresh fruits and vegetables every day. These foods may help prevent or relieve constipation. °· Resume activities such as climbing stairs,  driving, lifting, exercising, or traveling as directed by your health care provider. °· Talk to your health care provider about resuming sexual activities. This is dependent upon your risk of infection, your rate of healing, and your comfort and desire to resume sexual activity. °· Try to have someone help you with your household activities and your newborn for at least a few days after you leave the hospital. °· Rest as much as possible. Try to rest or take a nap when your newborn is sleeping. °· Increase your activities gradually. °· Keep all of your scheduled postpartum appointments. It is very important to keep your scheduled follow-up appointments. At these appointments, your health care provider will be checking to make sure that you are healing physically and emotionally. °SEEK MEDICAL CARE IF:  °· You are passing large clots from your vagina. Save any clots to show your health care provider. °· You have a foul smelling discharge from your vagina. °· You have trouble urinating. °· You are urinating frequently. °· You have pain when you urinate. °· You have a change in your bowel movements. °· You have increasing redness, pain, or swelling near your incision. °· You have pus draining from your incision. °· Your incision is separating. °· You have painful, hard, or reddened breasts. °· You have a severe headache. °· You have blurred vision or see spots. °· You feel sad or depressed. °· You have thoughts of hurting yourself or your newborn. °· You have questions about your care, the care of your newborn, or medications. °· You are dizzy or lightheaded. °· You have a rash. °· You   have pain, redness, or swelling at the site of the removed intravenous access (IV) tube. °· You have nausea or vomiting. °· You stopped breastfeeding and have not had a menstrual period within 12 weeks of stopping. °· You are not breastfeeding and have not had a menstrual period within 12 weeks of delivery. °· You have a fever. °SEEK  IMMEDIATE MEDICAL CARE IF: °· You have persistent pain. °· You have chest pain. °· You have shortness of breath. °· You faint. °· You have leg pain. °· You have stomach pain. °· Your vaginal bleeding saturates 2 or more sanitary pads in 1 hour. °MAKE SURE YOU:  °· Understand these instructions. °· Will watch your condition. °· Will get help right away if you are not doing well or get worse. °Document Released: 04/19/2002 Document Revised: 03/30/2013 Document Reviewed: 03/24/2012 °ExitCare® Patient Information ©2014 [employer]. ° ° ° °

## 2013-09-10 NOTE — Discharge Summary (Signed)
Obstetric Discharge Summary Reason for Admission: induction of labor for [facility] with no evidence of preeclampsia Prenatal Procedures: none Intrapartum Procedures: cesarean: low cervical, transverse and GBS prophylaxis Postpartum Procedures: none Complications-Operative and Postpartum: none Hemoglobin  Date Value Range Status  09/09/2013 11.1* 12.0 - 15.0 g/dL Final     HCT  Date Value Range Status  09/09/2013 33.0* 36.0 - 46.0 % Final   Ms Annie Parasngold is a 23yo G1 admitted at 39.3wks with elevated BPs while in MAU for labor eval in the early AM of 09/07/13. Labs were neg for pre-e and BPs were not in severe range, but BPs remained elevated so she was admitted for cx ripening. By the evening of 09/08/13 she progressed to 6cm and failed to progress further despite adequate ctx and a C/S was recommended for delivery. She tol the procedure well. By POD#1 Hgb was 11.1 and VS were stable. By POD#2 she was deemed to have received the full benefit of her hospital stay and she was d/c home. She is bottlefeeding and is unsure re contraception.  Physical Exam:  General: alert, cooperative and no distress Heart: RRR Lungs: nl effort Lochia: appropriate Uterine Fundus: firm Incision: honeycomb dsg with staining, unchanged DVT Evaluation: No evidence of DVT seen on physical exam.  Discharge Diagnoses: Term Pregnancy-delivered  Discharge Information: Date: 09/10/2013 Activity: pelvic rest Diet: routine Medications: PNV, Ibuprofen and Percocet Condition: stable Instructions: refer to practice specific booklet Discharge to: home Follow-up Information   Follow up with [facility]. Schedule an appointment as soon as possible for a visit in 4 weeks.   Contact information:   [address] [place] [id number] [phone]      Newborn Data: Live born female  Birth Weight: 6 lb 12.1 oz (3065 g) APGAR: 7, 9  Home with mother.  Cam HaiSHAW, Christyl Osentoski 09/10/2013, 7:41 AM

## 2013-09-11 LAB — TYPE AND SCREEN
ABO/RH(D): A NEG
Antibody Screen: POSITIVE
DAT, IgG: NEGATIVE
Unit division: 0

## 2013-09-11 LAB — RH IG WORKUP (INCLUDES ABO/RH)
ABO/RH(D): A NEG
Fetal Screen: NEGATIVE
Gestational Age(Wks): 39
Unit division: 0

## 2013-09-12 ENCOUNTER — Encounter (HOSPITAL_COMMUNITY): Payer: Self-pay | Admitting: Obstetrics & Gynecology

## 2013-09-12 ENCOUNTER — Encounter: Payer: Medicaid Other | Admitting: Women's Health

## 2013-09-14 NOTE — Discharge Summary (Signed)
Attestation of Attending Supervision of Advanced Practitioner (CNM/NP): Evaluation and management procedures were performed by the Advanced Practitioner under my supervision and collaboration. I have reviewed the Advanced Practitioner's note and chart, and I agree with the management and plan.  Brianca Fortenberry H. 3:52 PM   

## 2013-09-15 ENCOUNTER — Telehealth: Payer: Self-pay | Admitting: *Deleted

## 2013-09-15 ENCOUNTER — Encounter: Payer: Medicaid Other | Admitting: Advanced Practice Midwife

## 2013-09-15 ENCOUNTER — Ambulatory Visit (INDEPENDENT_AMBULATORY_CARE_PROVIDER_SITE_OTHER): Payer: Medicaid Other | Admitting: Advanced Practice Midwife

## 2013-09-15 ENCOUNTER — Encounter: Payer: Self-pay | Admitting: Advanced Practice Midwife

## 2013-09-15 VITALS — BP 130/80 | Ht 62.0 in | Wt 165.5 lb

## 2013-09-15 DIAGNOSIS — F53 Postpartum depression: Secondary | ICD-10-CM

## 2013-09-15 DIAGNOSIS — O99345 Other mental disorders complicating the puerperium: Secondary | ICD-10-CM

## 2013-09-15 MED ORDER — ESCITALOPRAM OXALATE 10 MG PO TABS: 10.0000 mg | ORAL_TABLET | Freq: Every day | ORAL | Status: DC

## 2013-09-15 NOTE — Telephone Encounter (Signed)
Camillia HerterKaren Little, RCHD, states pt c/o headaches and dizziness, c-section 09/08/13, B/P 110/70, also pt states only taking PNV. Appt made for today to see Rodena PietyFran Cresenzo, CNM.

## 2013-09-15 NOTE — Progress Notes (Signed)
Filed Vitals:   09/15/13 1454  BP: 130/80   1 week s/p PLTCS for failed IOL for GHTN.  Here for incision check and for anxiety/depression.  Jessica [name] states that she has had problems with anxiety in the past.  She had been given a rx for Xanax.  Denies ever having taken a SSRI.  Feels overwhelmed, cries a lot.  Has good family support.  Denies SI/HI.  Discussed SSRI.  Will rx Lexapro 10mg .  Declined counseling.  Incision C/D/intact without erythema. Having normal bowel movements.  Pain improving.    Plans Nexplanon vs IUD.  Paper signed for Nexplanon and she can call us and give us the go ahead to order when she decides.

## 2013-09-20 ENCOUNTER — Encounter: Payer: Medicaid Other | Admitting: Women's Health

## 2013-09-21 ENCOUNTER — Encounter (INDEPENDENT_AMBULATORY_CARE_PROVIDER_SITE_OTHER): Payer: Self-pay

## 2013-09-21 ENCOUNTER — Ambulatory Visit (INDEPENDENT_AMBULATORY_CARE_PROVIDER_SITE_OTHER): Payer: Medicaid Other | Admitting: Women's Health

## 2013-09-21 ENCOUNTER — Encounter: Payer: Self-pay | Admitting: Women's Health

## 2013-09-21 VITALS — BP 112/70 | Ht 64.0 in | Wt 164.0 lb

## 2013-09-21 DIAGNOSIS — Z98891 History of uterine scar from previous surgery: Secondary | ICD-10-CM

## 2013-09-21 DIAGNOSIS — L7682 Other postprocedural complications of skin and subcutaneous tissue: Secondary | ICD-10-CM

## 2013-09-21 DIAGNOSIS — Z9889 Other specified postprocedural states: Secondary | ICD-10-CM

## 2013-09-21 MED ORDER — OXYCODONE-ACETAMINOPHEN 5-325 MG PO TABS: 1.0000 | ORAL_TABLET | ORAL | Status: DC | PRN

## 2013-09-21 NOTE — Patient Instructions (Signed)
Levonorgestrel intrauterine device (IUD)--Mirena What is this medicine? LEVONORGESTREL IUD (LEE voe nor jes trel) is a contraceptive (birth control) device. The device is placed inside the uterus by a healthcare professional. It is used to prevent pregnancy and can also be used to treat heavy bleeding that occurs during your period. Depending on the device, it can be used for 3 to 5 years. This medicine may be used for other purposes; ask your health care provider or pharmacist if you have questions. COMMON BRAND NAME(S): Mirena, Skyla What should I tell my health care provider before I take this medicine? They need to know if you have any of these conditions: -abnormal Pap smear -cancer of the breast, uterus, or cervix -diabetes -endometritis -genital or pelvic infection now or in the past -have more than one sexual partner or your partner has more than one partner -heart disease -history of an ectopic or tubal pregnancy -immune system problems -IUD in place -liver disease or tumor -problems with blood clots or take blood-thinners -use intravenous drugs -uterus of unusual shape -vaginal bleeding that has not been explained -an unusual or allergic reaction to levonorgestrel, other hormones, silicone, or polyethylene, medicines, foods, dyes, or preservatives -pregnant or trying to get pregnant -breast-feeding How should I use this medicine? This device is placed inside the uterus by a health care professional. Talk to your pediatrician regarding the use of this medicine in children. Special care may be needed. Overdosage: If you think you have taken too much of this medicine contact a poison control center or emergency room at once. NOTE: This medicine is only for you. Do not share this medicine with others. What if I miss a dose? This does not apply. What may interact with this medicine? Do not take this medicine with any of the following  medications: -amprenavir -bosentan -fosamprenavir This medicine may also interact with the following medications: -aprepitant -barbiturate medicines for inducing sleep or treating seizures -bexarotene -griseofulvin -medicines to treat seizures like carbamazepine, ethotoin, felbamate, oxcarbazepine, phenytoin, topiramate -modafinil -pioglitazone -rifabutin -rifampin -rifapentine -some medicines to treat HIV infection like atazanavir, indinavir, lopinavir, nelfinavir, tipranavir, ritonavir -St. John's wort -warfarin This list may not describe all possible interactions. Give your health care provider a list of all the medicines, herbs, non-prescription drugs, or dietary supplements you use. Also tell them if you smoke, drink alcohol, or use illegal drugs. Some items may interact with your medicine. What should I watch for while using this medicine? Visit your doctor or health care professional for regular check ups. See your doctor if you or your partner has sexual contact with others, becomes HIV positive, or gets a sexual transmitted disease. This product does not protect you against HIV infection (AIDS) or other sexually transmitted diseases. You can check the placement of the IUD yourself by reaching up to the top of your vagina with clean fingers to feel the threads. Do not pull on the threads. It is a good habit to check placement after each menstrual period. Call your doctor right away if you feel more of the IUD than just the threads or if you cannot feel the threads at all. The IUD may come out by itself. You may become pregnant if the device comes out. If you notice that the IUD has come out use a backup birth control method like condoms and call your health care provider. Using tampons will not change the position of the IUD and are okay to use during your period. What side effects may I   notice from receiving this medicine? Side effects that you should report to your doctor or  health care professional as soon as possible: -allergic reactions like skin rash, itching or hives, swelling of the face, lips, or tongue -fever, flu-like symptoms -genital sores -high blood pressure -no menstrual period for 6 weeks during use -pain, swelling, warmth in the leg -pelvic pain or tenderness -severe or sudden headache -signs of pregnancy -stomach cramping -sudden shortness of breath -trouble with balance, talking, or walking -unusual vaginal bleeding, discharge -yellowing of the eyes or skin Side effects that usually do not require medical attention (report to your doctor or health care professional if they continue or are bothersome): -acne -breast pain -change in sex drive or performance -changes in weight -cramping, dizziness, or faintness while the device is being inserted -headache -irregular menstrual bleeding within first 3 to 6 months of use -nausea This list may not describe all possible side effects. Call your doctor for medical advice about side effects. You may report side effects to FDA at 1-800-FDA-1088. Where should I keep my medicine? This does not apply. NOTE: This sheet is a summary. It may not cover all possible information. If you have questions about this medicine, talk to your doctor, pharmacist, or health care provider.  2014, Elsevier/Gold Standard. (2011-08-28 13:54:04)  

## 2013-09-21 NOTE — Progress Notes (Signed)
Patient ID: Jessica [name], female   DOB: 06/16/1991, 23 y.o.   MRN: [id number]   [facility]  Patient name: Jessica [name] MRN [id number]  Date of birth: 01/22/1991  CC & HPI:  Jessica [name] [name] is a 23 y.o. Caucasian [id number] female, 2wks pp, presenting today for report of incisional pain s/p c/s. Describes as 'something pulling on inside' and some numbness around incision. Not taking ibuprofen b/c she took it on an empty stomach last time and it made her vomit, she has taken it previously w/o any problems. Out of percocet. APAP not helping. Denies fever/chills, constipation, incisional drainage/odor. Lochia normal w/o odor, changes pad 2-3x/day. Low back pain around epidural site. Bottlefeeding. Still deciding on nexplanon vs. Mirena. Discussed r/b of both.   Pertinent History Reviewed:  Medical & Surgical Hx:   Past Medical History  Diagnosis Date  . Abnormal Pap smear   . UTI (lower urinary tract infection) 01/12/2013  . Pregnant   . ADHD (attention deficit hyperactivity disorder)    Past Surgical History  Procedure Laterality Date  . Tooth extraction    . Cesarean section [name]/A 09/08/2013    Procedure: CESAREAN SECTION;  Surgeon: Tereso NewcomerUgonna A Anyanwu, MD;  Location: [facility];  Service: Obstetrics;  Laterality: [name]/A;   Medications: Reviewed & Updated - see associated section Social History: Reviewed -  reports that she has quit smoking. Her smoking use included Cigarettes. She smoked 0.00 packs per day. She has never used smokeless tobacco.  Objective Findings:  Vitals: BP 112/70  Ht 5\' 4"  (1.626 m)  Wt 164 lb (74.39 kg)  BMI 28.14 kg/m2  LMP 12/05/2012  Breastfeeding? No  Physical Examination: General appearance - alert, well appearing, and in no distress. Pt laying down when I walked in,  then quickly sat straight up w/o assistance, no grimacing/wincing. Abdomen - soft, nontender, incision well approximated and healing well. No erythema, drainage, odor.  Assessment & Plan:   A:   Incisional pain 2wks s/p c/s  Bottlefeeding  Contraception counseling  Low back pain around epidural site P:  Recommended taking ibuprofen w/ crackers or some other form of food- pt is adamant she does not want to    take ibuprofen b/c it made her vomit.    Rx percocet 5/325mg  #10 w/ 0RF  Recommended abdominal binder or pillow to support incision, warm showers/heating pad for lower back pain  F/U 4wks for pp visit  To call if she decides she definitely wants nexplanon so we can order. No sex until after nexplanon/mirena    placed.   Marge DuncansBooker, Senai Kingsley Randall CNM, [facility]NP-BC 09/21/2013 1:33 PM

## 2013-10-11 ENCOUNTER — Ambulatory Visit (INDEPENDENT_AMBULATORY_CARE_PROVIDER_SITE_OTHER): Payer: Medicaid Other | Admitting: Advanced Practice Midwife

## 2013-10-11 ENCOUNTER — Encounter: Payer: Self-pay | Admitting: Advanced Practice Midwife

## 2013-10-11 ENCOUNTER — Encounter (INDEPENDENT_AMBULATORY_CARE_PROVIDER_SITE_OTHER): Payer: Self-pay

## 2013-10-11 DIAGNOSIS — Z3202 Encounter for pregnancy test, result negative: Secondary | ICD-10-CM

## 2013-10-11 LAB — POCT URINE PREGNANCY: Preg Test, Ur: NEGATIVE

## 2013-10-11 MED ORDER — ESCITALOPRAM OXALATE 20 MG PO TABS: 20.0000 mg | ORAL_TABLET | Freq: Every day | ORAL | Status: DC

## 2013-10-11 MED ORDER — NAPROXEN 500 MG PO TABS: 500.0000 mg | ORAL_TABLET | Freq: Two times a day (BID) | ORAL | Status: DC

## 2013-10-11 MED ORDER — NORETHIN-ETH ESTRAD-FE BIPHAS 1 MG-10 MCG / 10 MCG PO TABS: 1.0000 | ORAL_TABLET | Freq: Every day | ORAL | Status: DC

## 2013-10-11 NOTE — Progress Notes (Signed)
  Jessica QuailRachel N Bidinger is a 23 y.o. who presents for a postpartum visit. She is 4 weeks postpartum following a low cervical transverse Cesarean section. I have fully reviewed the prenatal and intrapartum course. The delivery was at 39.4 gestational weeks. She was induced for GHTN, and the IOL failed.  She made it to 6cms for 4-5 hours  Anesthesia: epidural. Postpartum course has been complicated by PPD/anxiety.  Started on Lexapro after delivery. States cannot feel much difference. WIll increase to 20mg . Still declines counseling.  Baby's course has been uneventful. Baby is feeding by bottle. Bleeding: staining only. Bowel function is normal. Bladder function is normal. Patient is sexually active. Contraception method is none. Aware that she could be fertile. Postpartum depression screening: negative.    Review of Systems   Constitutional: Negative for fever and chills Eyes: Negative for visual disturbances Respiratory: Negative for shortness of breath, dyspnea Cardiovascular: Negative for chest pain or palpitations  Gastrointestinal: Negative for vomiting, diarrhea and .  States has a pain on Left side of incision after lifting something. Genitourinary: Negative for dysuria and urgency Musculoskeletal: Negative for back pain, joint pain, myalgias  Neurological: Negative for dizziness and headaches   Objective:     Filed Vitals:   10/11/13 1039  BP: 116/68   General:  alert, cooperative and no distress   Breasts:  negative  Lungs: clear to auscultation bilaterally  Heart:  regular rate and rhythm  Abdomen: Soft, nontender. Incision well healed.   Vulva:  normal  Vagina: normal vagina  Cervix:  closed  Corpus: Well involuted     Rectal Exam: no hemorrhoids        Assessment:    noraml postpartum exam.  Plan:    1. Contraception: OCP (estrogen/progesterone)  LoLoestrin: start today, b/u for 3 weeks 3.  Increase lexapro to 20mg  4.  Naproxyn for pain 5, Follow up in: 4 week for med  check or as needed.

## 2013-10-14 ENCOUNTER — Telehealth: Payer: Self-pay | Admitting: *Deleted

## 2013-10-14 MED ORDER — HYDROCODONE-ACETAMINOPHEN 5-325 MG PO TABS: 1.0000 | ORAL_TABLET | Freq: Four times a day (QID) | ORAL | Status: DC | PRN

## 2013-10-14 NOTE — Telephone Encounter (Signed)
Pt states that she is still cramping and lower back is hurting. Pt states that she is having stabbing pain in her stomach and pain in her lower back. Pt states that she was given Naproxen and has no relief. Pt has no appetite, she is having nausea, no vomiting or diarrhea. Wants something to help the pain. Spoke with Dr. Despina HiddenEure, he gave verbal order for Lortab 5-325 disp 30 take 1-2 tablets every 6 hours for pain.Pt aware to come and pick up the Rx.

## 2013-10-18 ENCOUNTER — Telehealth: Payer: Self-pay | Admitting: Obstetrics & Gynecology

## 2013-10-19 NOTE — Telephone Encounter (Signed)
C/o back pain and cramping, tylenol does not help. Pt states had C-section 09/08/2012. Call transferred to front staff for an appt.

## 2013-10-20 ENCOUNTER — Ambulatory Visit (INDEPENDENT_AMBULATORY_CARE_PROVIDER_SITE_OTHER): Payer: Medicaid Other | Admitting: Advanced Practice Midwife

## 2013-10-20 ENCOUNTER — Encounter: Payer: Self-pay | Admitting: Advanced Practice Midwife

## 2013-10-20 VITALS — BP 108/68 | Ht 64.0 in | Wt 163.5 lb

## 2013-10-20 DIAGNOSIS — M62830 Muscle spasm of back: Secondary | ICD-10-CM

## 2013-10-20 DIAGNOSIS — O26899 Other specified pregnancy related conditions, unspecified trimester: Secondary | ICD-10-CM

## 2013-10-20 DIAGNOSIS — M5380 Other specified dorsopathies, site unspecified: Secondary | ICD-10-CM

## 2013-10-20 DIAGNOSIS — O909 Complication of the puerperium, unspecified: Secondary | ICD-10-CM

## 2013-10-20 MED ORDER — METAXALONE 800 MG PO TABS: 800.0000 mg | ORAL_TABLET | Freq: Three times a day (TID) | ORAL | Status: DC

## 2013-10-20 NOTE — Progress Notes (Signed)
Jessica [name] 22 y.o.  Filed Vitals:   10/20/13 1455  BP: 108/68   Past Medical History  Diagnosis Date  . Abnormal Pap smear   . UTI (lower urinary tract infection) 01/12/2013  . Pregnant   . ADHD (attention deficit hyperactivity disorder)    Past Surgical History  Procedure Laterality Date  . Tooth extraction    . Cesarean section N/A 09/08/2013    Procedure: CESAREAN SECTION;  Surgeon: Tereso NewcomerUgonna A Anyanwu, MD;  Location: [facility];  Service: Obstetrics;  Laterality: N/A;   family history includes Diabetes in her maternal grandmother and paternal grandmother. Current outpatient prescriptions:Norethindrone-Ethinyl Estradiol-Fe Biphas (LO LOESTRIN FE) 1 MG-10 MCG / 10 MCG tablet, Take 1 tablet by mouth daily., Disp: 1 Package, Rfl: 11;  amoxicillin (AMOXIL) 500 MG tablet, Take 500 mg by mouth 2 (two) times daily., Disp: , Rfl: ;  cetirizine (ZYRTEC) 10 MG tablet, Take 10 mg by mouth daily as needed for allergies or rhinitis., Disp: , Rfl:  escitalopram (LEXAPRO) 20 MG tablet, Take 1 tablet (20 mg total) by mouth daily., Disp: 30 tablet, Rfl: 3;  HYDROcodone-acetaminophen (NORCO/VICODIN) 5-325 MG per tablet, Take 1-2 tablets by mouth every 6 (six) hours as needed for moderate pain., Disp: 30 tablet, Rfl: 0;  ibuprofen (ADVIL,MOTRIN) 600 MG tablet, Take 1 tablet (600 mg total) by mouth every 6 (six) hours., Disp: 30 tablet, Rfl: 0 naproxen (NAPROSYN) 500 MG tablet, Take 1 tablet (500 mg total) by mouth 2 (two) times daily with a meal., Disp: 30 tablet, Rfl: 1;  oxyCODONE-acetaminophen (PERCOCET/ROXICET) 5-325 MG per tablet, Take 1 tablet by mouth every 4 (four) hours as needed for severe pain (moderate - severe pain)., Disp: 10 tablet, Rfl: 0;  prenatal vitamin w/FE, FA (PRENATAL 1 + 1) 27-1 MG TABS tablet, Take 1 tablet by mouth daily at 12 noon., Disp: 30 each, Rfl: 11  HPI: Had an uncomplicated c/s for failed IOL 6 weeks ago.  Has c/o lower back pain and persistant  abdominal pain in upper abdomen  and sharp stabbing pains in sides since delivery.  Has taken naproxyn without relief. Called office for Vicodin 5/325#30 10/14/13.  Here today with same complaint and request.  PE: coexam with [facility].  Has many trigger points along upper and lower back.  Feels like abdominal/side pain is probably radiating from back  ASSESSMENT: musculoskeletal pain  PLAN: Dr. Ladona Ridgelaylor (chiropractor) called and pt may go right over for appt.  Skelaxin sent to pharmacy

## 2013-10-21 ENCOUNTER — Ambulatory Visit: Payer: Medicaid Other | Admitting: Adult Health

## 2013-10-24 ENCOUNTER — Telehealth: Payer: Self-pay | Admitting: Obstetrics & Gynecology

## 2013-10-24 MED ORDER — CYCLOBENZAPRINE HCL 10 MG PO TABS: 10.0000 mg | ORAL_TABLET | Freq: Three times a day (TID) | ORAL | Status: DC | PRN

## 2013-10-24 NOTE — Telephone Encounter (Signed)
Pt states c/o back pain unable to get RX for Skelaxin filled, not covered under her insurance cost $140. Can something else be prescribed for the pt back pain. [facility]

## 2013-10-24 NOTE — Telephone Encounter (Signed)
Flexeril e prescribed for pt, does not need to be seen again

## 2013-10-24 NOTE — Telephone Encounter (Signed)
Pt informed flexeril e-scribed.

## 2013-10-25 ENCOUNTER — Ambulatory Visit: Payer: Medicaid Other | Admitting: Advanced Practice Midwife

## 2013-10-26 ENCOUNTER — Telehealth: Payer: Self-pay | Admitting: Obstetrics and Gynecology

## 2013-10-26 NOTE — Telephone Encounter (Signed)
Pt states Flexeril not helping, call transferred to front staff for an appt to be made.

## 2013-10-27 ENCOUNTER — Ambulatory Visit (INDEPENDENT_AMBULATORY_CARE_PROVIDER_SITE_OTHER): Payer: Medicaid Other | Admitting: Obstetrics and Gynecology

## 2013-10-27 ENCOUNTER — Ambulatory Visit: Payer: Medicaid Other | Admitting: Obstetrics and Gynecology

## 2013-10-27 ENCOUNTER — Encounter: Payer: Self-pay | Admitting: Obstetrics and Gynecology

## 2013-10-27 VITALS — BP 120/70 | Temp 98.1°F | Wt 159.0 lb

## 2013-10-27 DIAGNOSIS — M549 Dorsalgia, unspecified: Secondary | ICD-10-CM

## 2013-10-27 DIAGNOSIS — O909 Complication of the puerperium, unspecified: Secondary | ICD-10-CM

## 2013-10-27 DIAGNOSIS — O26899 Other specified pregnancy related conditions, unspecified trimester: Secondary | ICD-10-CM

## 2013-10-27 MED ORDER — HYDROCODONE-ACETAMINOPHEN 5-325 MG PO TABS: 1.0000 | ORAL_TABLET | Freq: Four times a day (QID) | ORAL | Status: DC | PRN

## 2013-10-27 NOTE — Progress Notes (Signed)
This chart was scribed by Leone PayorSonum Patel, Medical Scribe, for Dr. Christin BachJohn Dezeray Puccio on 10/27/13 at 9:35 AM. This chart was reviewed by Dr. Christin BachJohn Tripp Goins and is accurate.    [facility] Visit  Patient name: Jessica QuailRachel N [name] MRN [id number]  Date of birth: 02/27/1991  CC & HPI:  Jessica QuailRachel N [name] is a 23 y.o. female presenting today for scheduled follow up for lower back pain. Pt was prescribed flexeril and advised to see a chiropractor for her continued lower back pain. Pt states the pain is worse with standing. Pt states the pain has been ongoing since having an epidural 2 months ago.   Patient has stopped seeing chiropractor due to discomfort with physical manipulations. Considering to follow up with physical therapy.   ROS:  Lower back pain Nausea   Pertinent History Reviewed:  Medical & Surgical Hx:  Reviewed: Significant for   Past Medical History  Diagnosis Date  . Abnormal Pap smear   . UTI (lower urinary tract infection) 01/12/2013  . Pregnant   . ADHD (attention deficit hyperactivity disorder)     Past Surgical History  Procedure Laterality Date  . Tooth extraction    . Cesarean section N/A 09/08/2013    Procedure: CESAREAN SECTION;  Surgeon: Tereso NewcomerUgonna A Anyanwu, MD;  Location: [facility];  Service: Obstetrics;  Laterality: N/A;    Medications: Reviewed & Updated - see associated section Social History: Reviewed -  reports that she has been smoking Cigarettes.  She has been smoking about 1.00 pack per day. She has never used smokeless tobacco.  Objective Findings:  Vitals: BP 120/70  Temp(Src) 98.1 F (36.7 C)  Wt 159 lb (72.122 kg)  LMP 10/06/2013  Physical Examination: General appearance - alert, well appearing, and in no distress and oriented to person, place, and time Pelvic - examination not indicated Back exam - full range of motion, no tenderness, palpable spasm or pain on motion. Skin is perceived to be sensitive to patient. Cannot appreciate a muscle spasm.  abd well  healed.   Assessment & Plan:   A: 1. Low back pain chronic since delivery by C/S, s/p epidural, without radiation. 2.  Pt discontinued Chiropractor  P: 1. Physical therapy referral  2. Massager prn to lumbar area.

## 2013-10-27 NOTE — Patient Instructions (Signed)
Physical therapy. 

## 2013-11-02 ENCOUNTER — Ambulatory Visit (HOSPITAL_COMMUNITY)
Admission: RE | Admit: 2013-11-02 | Discharge: 2013-11-02 | Disposition: A | Discharge status: Home / Self Care | Payer: Medicaid Other | Source: Ambulatory Visit | Attending: Obstetrics and Gynecology | Admitting: Obstetrics and Gynecology

## 2013-11-02 DIAGNOSIS — M545 Low back pain, unspecified: Secondary | ICD-10-CM

## 2013-11-02 DIAGNOSIS — IMO0001 Reserved for inherently not codable concepts without codable children: Secondary | ICD-10-CM | POA: Insufficient documentation
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
## 2013-11-03 ENCOUNTER — Ambulatory Visit: Payer: Medicaid Other | Admitting: Obstetrics and Gynecology

## 2013-11-07 ENCOUNTER — Encounter: Payer: Self-pay | Admitting: Obstetrics and Gynecology

## 2013-11-07 ENCOUNTER — Emergency Department: Payer: Self-pay | Admitting: Emergency Medicine

## 2013-11-07 NOTE — Progress Notes (Unsigned)
Patient ID: Jessica QuailRachel N [name], female   DOB: 03/09/1991, 23 y.o.   MRN: [id number] ANONYMOUS CALL FROM NEIGHBORS HOUSE ON [place].  STATES [place] IS SEEKING PAIN MEDS AND DOES NOT APPEAR TO BE HAVING PROBS AT ALL.  NEIGHBOR IS WORRIED ABOUT THE SAFETY OF THE BABY WHEN PATIENT IS TAKING MEDS.    YJeannie Fend

## 2013-11-08 ENCOUNTER — Inpatient Hospital Stay (HOSPITAL_COMMUNITY): Admission: RE | Admit: 2013-11-08 | Payer: Medicaid Other | Source: Ambulatory Visit | Admitting: Physical Therapy

## 2013-11-08 ENCOUNTER — Telehealth: Payer: Self-pay

## 2013-11-08 NOTE — Telephone Encounter (Signed)
Pt informed message sent to Dr. Despina HiddenEure, will call pt back as soon as Dr. Despina HiddenEure responds to message. Pt verbalized understanding.

## 2013-11-09 NOTE — Telephone Encounter (Signed)
Pt informed will not be giving any more pain meds per Dr. Despina HiddenEure.

## 2013-11-09 NOTE — Telephone Encounter (Signed)
We will not be giving the patient any more pain meds All notes have been reviewed

## 2013-11-11 ENCOUNTER — Ambulatory Visit (HOSPITAL_COMMUNITY): Payer: Medicaid Other

## 2013-11-15 ENCOUNTER — Ambulatory Visit (HOSPITAL_COMMUNITY): Payer: Medicaid Other | Admitting: Physical Therapy

## 2013-11-16 ENCOUNTER — Telehealth: Payer: Self-pay | Admitting: *Deleted

## 2013-11-16 NOTE — Telephone Encounter (Signed)
Heat or ice for the pain or take motrin per Dr. Despina HiddenEure. Pt was asked which ER she went to and when she went. The pt acted confused and stated that she went to [facility] about 2 weeks ago. Pt was advised of above and verbalized understanding, and was informed again that she would not be receiving pain medication from our office. Dr. Despina HiddenEure also made aware of this/

## 2013-11-16 NOTE — Telephone Encounter (Signed)
Pt is calling stating that she is still having back pain that comes and goes.  I informed her that per Dr. Despina HiddenEure on 11/08/13 he will not give her anymore pain medication.  She states she has been to the ED with the pain and they said something might be going on with her gallbladder that could be causing some of the back pain but they couldn't really tell because she had just eaten something before she went, she states she does not have a PCP and the hospital didn't say anything to her about following up on the gallbladder.  She also states that she went to PT once but can not go back because Medicaid will not pay for the visits.  She is wanting to know what else we suggest, please advise.

## 2013-11-18 ENCOUNTER — Ambulatory Visit (HOSPITAL_COMMUNITY): Payer: Medicaid Other

## 2013-11-18 NOTE — Telephone Encounter (Signed)
I would need to see pt to assess. I have not been involved in this care recently.

## 2013-11-18 NOTE — Telephone Encounter (Signed)
Spoke with pt letting her know Dr. Emelda FearFerguson would need to assess her. Call transferred to front desk to schedule an appt. JSY

## 2013-11-21 ENCOUNTER — Encounter: Payer: Self-pay | Admitting: Obstetrics and Gynecology

## 2013-11-21 ENCOUNTER — Ambulatory Visit (INDEPENDENT_AMBULATORY_CARE_PROVIDER_SITE_OTHER): Payer: Medicaid Other | Admitting: Obstetrics and Gynecology

## 2013-11-21 VITALS — BP 122/72 | Wt 152.8 lb

## 2013-11-21 DIAGNOSIS — M545 Low back pain, unspecified: Secondary | ICD-10-CM

## 2013-11-21 DIAGNOSIS — O26899 Other specified pregnancy related conditions, unspecified trimester: Secondary | ICD-10-CM

## 2013-11-21 DIAGNOSIS — O909 Complication of the puerperium, unspecified: Secondary | ICD-10-CM

## 2013-11-21 NOTE — Progress Notes (Signed)
This chart was scribed by Leone PayorSonum Patel, Medical Scribe, for Dr. Christin BachJohn Maclaine Ahola on 11/21/13 at 9:59 AM. This chart was reviewed by Dr. Christin BachJohn Faatimah Spielberg and is accurate.   [facility] Visit  Patient name: Jessica [name] MRN [id number]  Date of birth: 11/25/1990  CC & HPI:  Jessica QuailRachel N Siguenza is a 23 y.o. female presenting today for unchanged low back pain since delivering 3 months ago. She [facility] for RUQ pain and low back pain. She also reports stinging pain across her mid to lower abdomen "where the numbness is."  She states the low back pain occurs when standing up after leaning over. She has been exercising and walking without relief.   She is living with her significant other and her baby. His family and her grandmother help out with taking care of the baby. She has been taking her OCP daily.   ROS:  Low back pain, abdominal pain   Pertinent History Reviewed:  Medical & Surgical Hx:  Reviewed: Significant for  Past Medical History  Diagnosis Date  . Abnormal Pap smear   . UTI (lower urinary tract infection) 01/12/2013  . Pregnant   . ADHD (attention deficit hyperactivity disorder)     Past Surgical History  Procedure Laterality Date  . Tooth extraction    . Cesarean section N/A 09/08/2013    Procedure: CESAREAN SECTION;  Surgeon: Tereso NewcomerUgonna A Anyanwu, MD;  Location: [facility];  Service: Obstetrics;  Laterality: N/A;    Medications: Reviewed & Updated - see associated section Social History: Reviewed -  reports that she has been smoking Cigarettes.  She has been smoking about 1.00 pack per day. She has never used smokeless tobacco.  Objective Findings:  Vitals: BP 122/72  Wt 152 lb 12.8 oz (69.31 kg)  LMP 11/21/2013  Physical Examination: General appearance - alert, well appearing, and in no distress and oriented to person, place, and time Abdomen - soft, nontender, nondistended, no masses or organomegaly Pelvic - examination not  indicated   Assessment & Plan:   A: 1. Resolved chronic pain complaints  2. Well healed s/p cesarean section   P: 1. Routine GYN care.  2. Continue OCP's 3. Pain medication asked for but none were given or prescribed.

## 2013-11-22 ENCOUNTER — Ambulatory Visit (HOSPITAL_COMMUNITY): Payer: Medicaid Other | Admitting: Physical Therapy

## 2013-11-25 ENCOUNTER — Ambulatory Visit (HOSPITAL_COMMUNITY): Payer: Medicaid Other

## 2013-11-29 ENCOUNTER — Ambulatory Visit (HOSPITAL_COMMUNITY): Payer: Medicaid Other

## 2013-12-02 ENCOUNTER — Ambulatory Visit (HOSPITAL_COMMUNITY): Payer: Medicaid Other

## 2013-12-06 ENCOUNTER — Ambulatory Visit (HOSPITAL_COMMUNITY): Payer: Medicaid Other | Admitting: Physical Therapy

## 2013-12-09 ENCOUNTER — Ambulatory Visit (HOSPITAL_COMMUNITY): Payer: Medicaid Other | Admitting: Physical Therapy

## 2013-12-13 ENCOUNTER — Ambulatory Visit (HOSPITAL_COMMUNITY): Payer: Medicaid Other | Admitting: Physical Therapy

## 2013-12-14 ENCOUNTER — Ambulatory Visit: Payer: Medicaid Other | Admitting: Obstetrics and Gynecology

## 2013-12-15 ENCOUNTER — Ambulatory Visit: Payer: Medicaid Other | Admitting: Advanced Practice Midwife

## 2013-12-16 ENCOUNTER — Ambulatory Visit (HOSPITAL_COMMUNITY): Payer: Medicaid Other

## 2013-12-19 ENCOUNTER — Telehealth: Payer: Self-pay | Admitting: *Deleted

## 2013-12-19 ENCOUNTER — Encounter: Payer: Self-pay | Admitting: Women's Health

## 2013-12-19 ENCOUNTER — Ambulatory Visit (INDEPENDENT_AMBULATORY_CARE_PROVIDER_SITE_OTHER): Payer: Medicaid Other | Admitting: Women's Health

## 2013-12-19 VITALS — BP 112/70 | Ht 62.0 in | Wt 156.0 lb

## 2013-12-19 DIAGNOSIS — L7682 Other postprocedural complications of skin and subcutaneous tissue: Secondary | ICD-10-CM

## 2013-12-19 DIAGNOSIS — R209 Unspecified disturbances of skin sensation: Secondary | ICD-10-CM

## 2013-12-19 NOTE — Patient Instructions (Signed)
Use heat or ice, tylenol and/or ibuprofen for pain as needed

## 2013-12-19 NOTE — Progress Notes (Signed)
Patient ID: Jessica [name], female   DOB: 10/05/1990, 23 y.o.   MRN: [id number]   [facility]  Patient name: Jessica [name] MRN [id number]  Date of birth: 06/18/1991  CC & HPI:  Jessica [name] is a 23 y.o. [id number] Caucasian female s/p PLTCS on 09/08/13 presenting today for report of burning/itching internal c/s incisional pain x 3days. Also w/ some incisional numbness that has been present since c/s. Denies fever/chills, abnormal incisional d/c, or odor. States ice, tylenol, ibuprofen not helping, requesting pain meds. Has multiple notes in chart that she is not to be given any narcotics by our office.   Pertinent History Reviewed:  Medical & Surgical Hx:   Past Medical History  Diagnosis Date  . Abnormal Pap smear   . UTI (lower urinary tract infection) 01/12/2013  . Pregnant   . ADHD (attention deficit hyperactivity disorder)    Past Surgical History  Procedure Laterality Date  . Tooth extraction    . Cesarean section N/A 09/08/2013    Procedure: CESAREAN SECTION;  Surgeon: Tereso NewcomerUgonna A Anyanwu, MD;  Location: [facility];  Service: Obstetrics;  Laterality: N/A;   Medications: Reviewed & Updated - see associated section Social History: Reviewed -  reports that she has been smoking Cigarettes.  She has been smoking about 1.00 pack per day. She has never used smokeless tobacco.  Objective Findings:  Vitals: BP 112/70  Ht 5\' 2"  (1.575 m)  Wt 156 lb (70.761 kg)  BMI 28.53 kg/m2  LMP 11/21/2013  Physical Examination: General appearance - alert, well appearing, and in no distress Abdomen - soft, nontender, c/s incision well-healed, edges well-approximated, no drainage, no tenderness to light/deep palpation, no exacerbation of symptoms w/ exam  No results found for this or any previous visit (from the past 24 hour(s)).   Assessment & Plan:  A:   3 1/2 months s/p PLTCS  Incisional itching/burning/numbness- sounds like part of natural healing process/nerve  regeneration   P:  Heat/ice- whichever feels better- ibuprofen/apap as needed  Offered incisional lidocaine injection, which I have never done myself and MDs not currently here to supervise- offered for her to come back this afternoon or reschedule this week w/ MD- states she doesn't think she can get ride  F/U prn    Marge DuncansKimberly Randall Nameer Summer CNM, [facility] 12/19/2013 1:14 PM

## 2013-12-20 ENCOUNTER — Ambulatory Visit: Payer: Medicaid Other | Admitting: Obstetrics & Gynecology

## 2013-12-23 ENCOUNTER — Encounter: Payer: Self-pay | Admitting: *Deleted

## 2013-12-23 ENCOUNTER — Ambulatory Visit: Payer: Medicaid Other | Admitting: Obstetrics & Gynecology

## 2014-01-09 ENCOUNTER — Telehealth: Payer: Self-pay | Admitting: *Deleted

## 2014-01-09 ENCOUNTER — Emergency Department: Payer: Self-pay | Admitting: Emergency Medicine

## 2014-01-09 LAB — URINALYSIS, COMPLETE
Bacteria: NONE SEEN
Bilirubin,UR: NEGATIVE
Blood: NEGATIVE
Glucose,UR: NEGATIVE mg/dL (ref 0–75)
Ketone: NEGATIVE
Leukocyte Esterase: NEGATIVE
Nitrite: NEGATIVE
Ph: 7 (ref 4.5–8.0)
Protein: NEGATIVE
RBC,UR: NONE SEEN /HPF (ref 0–5)
Specific Gravity: 1.027 (ref 1.003–1.030)
Squamous Epithelial: 2
WBC UR: <1 /HPF (ref 0–5)

## 2014-01-09 LAB — CBC WITH DIFFERENTIAL/PLATELET
Basophil #: 0 10*3/uL (ref 0.0–0.1)
Basophil %: 0.5 %
Eosinophil #: 0.1 10*3/uL (ref 0.0–0.7)
Eosinophil %: 1.8 %
HCT: 41.2 % (ref 35.0–47.0)
HGB: 13.8 g/dL (ref 12.0–16.0)
Lymphocyte #: 3 10*3/uL (ref 1.0–3.6)
Lymphocyte %: 38.4 %
MCH: 28.5 pg (ref 26.0–34.0)
MCHC: 33.5 g/dL (ref 32.0–36.0)
MCV: 85 fL (ref 80–100)
Monocyte #: 0.5 x10 3/mm (ref 0.2–0.9)
Monocyte %: 6.9 %
Neutrophil #: 4.1 10*3/uL (ref 1.4–6.5)
Neutrophil %: 52.4 %
Platelet: 281 10*3/uL (ref 150–440)
RBC: 4.86 10*6/uL (ref 3.80–5.20)
RDW: 13.8 % (ref 11.5–14.5)
WBC: 7.9 10*3/uL (ref 3.6–11.0)

## 2014-01-09 LAB — COMPREHENSIVE METABOLIC PANEL
Albumin: 3.8 g/dL (ref 3.4–5.0)
Alkaline Phosphatase: 71 U/L
Anion Gap: 6 — ABNORMAL LOW (ref 7–16)
BUN: 14 mg/dL (ref 7–18)
Bilirubin,Total: 0.2 mg/dL (ref 0.2–1.0)
Calcium, Total: 9.4 mg/dL (ref 8.5–10.1)
Chloride: 106 mmol/L (ref 98–107)
Co2: 26 mmol/L (ref 21–32)
Creatinine: 0.68 mg/dL (ref 0.60–1.30)
EGFR (African American): >60
EGFR (Non-African Amer.): >60
Glucose: 82 mg/dL (ref 65–99)
Osmolality: 275 (ref 275–301)
Potassium: 3.6 mmol/L (ref 3.5–5.1)
SGOT(AST): 14 U/L — ABNORMAL LOW (ref 15–37)
SGPT (ALT): 15 U/L (ref 12–78)
Sodium: 138 mmol/L (ref 136–145)
Total Protein: 7.8 g/dL (ref 6.4–8.2)

## 2014-01-09 NOTE — Telephone Encounter (Signed)
Pt states having "pain underneath her incision site was to come back for an injection but did not keep her appt." Call transferred to front staff for an appt to be scheduled.

## 2014-01-10 ENCOUNTER — Ambulatory Visit: Payer: Medicaid Other | Admitting: Women's Health

## 2014-01-13 ENCOUNTER — Encounter (HOSPITAL_COMMUNITY): Payer: Self-pay | Admitting: Emergency Medicine

## 2014-01-13 ENCOUNTER — Emergency Department (HOSPITAL_COMMUNITY)
Admission: EM | Admit: 2014-01-13 | Discharge: 2014-01-13 | Disposition: A | Discharge status: Home / Self Care | Payer: Medicaid Other | Attending: Emergency Medicine | Admitting: Emergency Medicine

## 2014-01-13 DIAGNOSIS — Z8659 Personal history of other mental and behavioral disorders: Secondary | ICD-10-CM | POA: Insufficient documentation

## 2014-01-13 DIAGNOSIS — Z79899 Other long term (current) drug therapy: Secondary | ICD-10-CM | POA: Insufficient documentation

## 2014-01-13 DIAGNOSIS — M273 Alveolitis of jaws: Secondary | ICD-10-CM | POA: Insufficient documentation

## 2014-01-13 DIAGNOSIS — Z87440 Personal history of urinary (tract) infections: Secondary | ICD-10-CM | POA: Insufficient documentation

## 2014-01-13 DIAGNOSIS — F17200 Nicotine dependence, unspecified, uncomplicated: Secondary | ICD-10-CM | POA: Insufficient documentation

## 2014-01-13 MED ORDER — ONDANSETRON 4 MG PO TBDP
4.0000 mg | ORAL_TABLET | Freq: Once | ORAL | Status: AC
  Administered 2014-01-13: 4 mg via ORAL
  Filled 2014-01-13: qty 1

## 2014-01-13 MED ORDER — KETOROLAC TROMETHAMINE 60 MG/2ML IM SOLN
60.0000 mg | Freq: Once | INTRAMUSCULAR | Status: AC
  Administered 2014-01-13: 60 mg via INTRAMUSCULAR
  Filled 2014-01-13: qty 2

## 2014-01-13 MED ORDER — MORPHINE SULFATE 4 MG/ML IJ SOLN
8.0000 mg | Freq: Once | INTRAMUSCULAR | Status: AC
  Administered 2014-01-13: 8 mg via INTRAMUSCULAR
  Filled 2014-01-13: qty 2

## 2014-01-13 MED ORDER — DICLOFENAC SODIUM 75 MG PO TBEC: 75.0000 mg | DELAYED_RELEASE_TABLET | Freq: Two times a day (BID) | ORAL | Status: DC

## 2014-01-13 MED ORDER — HYDROCODONE-ACETAMINOPHEN 7.5-325 MG PO TABS
1.0000 | ORAL_TABLET | ORAL | Status: DC | PRN
Start: 2014-01-13 — End: 2014-03-04

## 2014-01-13 NOTE — ED Notes (Addendum)
L upper molar extracted 2 days ago.  Has had increasing pain w/numbness on cheek. Has taken Percocet as prescribed, but has only help mildly.  Was also put on PCN post procedure which she states she has been taking.

## 2014-01-13 NOTE — Discharge Instructions (Signed)
Please use a liquid diet until you're pain and discomfort have improved. It is important that you see your oral surgeon for additional evaluation of this dry socket condition. Please finish your antibiotic. Please use diclofenac 2 times daily, Norco every 4 hours if needed for pain. This medication may cause drowsiness, please use with caution. Dental Dry Socket A dental dry socket can happen after a tooth is pulled. When a tooth gets pulled, it leaves a hole (socket). Normally, blood fills up the hole and hardens (clots). A dry socket happens if blood gets removed from the hole or does not fill the hole.  HOME CARE  Follow your dentist's instructions.  Only take medicines as told by your dentist.  Take your medicine (antibiotics) as told if you are given medicine. Finish them even if you start to feel better.  Wait 1 day to rinse your mouth with warm salt water. Spit water out very gently.  Avoid bubbly (carbonated) drinks.  Avoid alcohol.  Avoid smoking. GET HELP RIGHT AWAY IF:  Your medicine does not help your pain.  You have puffiness (swelling), severe pain, or you cannot stop bleeding.  You have a temperature by mouth above 102 F (38.9 C), not controlled by medicine.  You have trouble swallowing or cannot open your mouth.  You have severe problems (symptoms). MAKE SURE YOU:  Understand these instructions.  Will watch your condition.  Will get help right away if you are not doing well or get worse. Document Released: 07/28/2005 Document Revised: 10/20/2011 Document Reviewed: 11/18/2010 [facility] Patient Information 2014 [place], [place].

## 2014-01-13 NOTE — ED Provider Notes (Signed)
Medical screening examination/treatment/procedure(s) were performed by non-physician practitioner and as supervising physician I was immediately available for consultation/collaboration.   EKG Interpretation None        Benny Lennert, MD 01/13/14 2310

## 2014-01-13 NOTE — ED Provider Notes (Signed)
CSN: [id number]     Arrival date & time 01/13/14  1653 History   First MD Initiated Contact with Patient 01/13/14 1728     Chief Complaint  Patient presents with  . Dental Pain     (Consider location/radiation/quality/duration/timing/severity/associated sxs/prior Treatment) HPI Comments: Patient states that she was seen by an oral surgeon in [place], the name of which she does not remember. The patient states she had her left upper molar taken out about 2 days ago. She states that starting on last evening she began to have increasing pain and she feels that there is some numbness also of her cheek. The pain has gotten progressively worse. She's taken medication for pain but states that this has not helped. The patient states she also has penicillin but this is not helping either. Patient states that she cannot chew, and that the pain is at times keeping her up. She has not notified the oral surgeon because she says she cannot remember the person's name.  The history is provided by the patient.    Past Medical History  Diagnosis Date  . Abnormal Pap smear   . UTI (lower urinary tract infection) 01/12/2013  . Pregnant   . ADHD (attention deficit hyperactivity disorder)    Past Surgical History  Procedure Laterality Date  . Tooth extraction    . Cesarean section N/A 09/08/2013    Procedure: CESAREAN SECTION;  Surgeon: Tereso Newcomer, MD;  Location: [facility];  Service: Obstetrics;  Laterality: N/A;   Family History  Problem Relation Age of Onset  . Diabetes Maternal Grandmother   . Diabetes Paternal Grandmother    History  Substance Use Topics  . Smoking status: Current Every Day Smoker -- 1.00 packs/day    Types: Cigarettes  . Smokeless tobacco: Never Used  . Alcohol Use: No   OB History   Grav Para Term Preterm Abortions TAB SAB Ect Mult Living   1 1 1       1      Review of Systems  Constitutional: Negative for activity change.       All ROS Neg except as noted in HPI   HENT: Positive for dental problem. Negative for nosebleeds.   Eyes: Negative for photophobia and discharge.  Respiratory: Negative for cough, shortness of breath and wheezing.   Cardiovascular: Negative for chest pain and palpitations.  Gastrointestinal: Negative for abdominal pain and blood in stool.  Genitourinary: Negative for dysuria, frequency and hematuria.  Musculoskeletal: Negative for arthralgias, back pain and neck pain.  Skin: Negative.   Neurological: Negative for dizziness, seizures and speech difficulty.  Psychiatric/Behavioral: Negative for hallucinations and confusion.      Allergies  Review of patient's allergies indicates no known allergies.  Home Medications   Prior to Admission medications   Medication Sig Start Date End Date Taking? Authorizing Provider  Norethindrone-Ethinyl Estradiol-Fe Biphas (LO LOESTRIN FE) 1 MG-10 MCG / 10 MCG tablet Take 1 tablet by mouth daily. 10/11/13  Yes Jacklyn Shell, CNM  oxyCODONE-acetaminophen (PERCOCET/ROXICET) 5-325 MG per tablet Take 1 tablet by mouth every 4 (four) hours as needed for severe pain (moderate - severe pain). 09/21/13  Yes Marge Duncans, CNM  penicillin v potassium (VEETID) 500 MG tablet Take 500 mg by mouth every 4 (four) hours. Patient was prescribed this medication on 01/11/2014 and to take until all gone #30   Yes Historical Provider, MD   BP 114/50  Pulse 65  Temp(Src) 97.4 F (36.3 C) (Oral)  Resp 20  SpO2 100%  LMP 12/19/2013 Physical Exam  Nursing note and vitals reviewed. Constitutional: She is oriented to person, place, and time. She appears well-developed and well-nourished.  Non-toxic appearance.  HENT:  Head: Normocephalic.  Right Ear: Tympanic membrane and external ear normal.  Left Ear: Tympanic membrane and external ear normal.  There is a healing surgical site of the left upper molar area. There is mild swelling of the gum, but no abscess appreciated. The airway is patent.  There is no swelling under the tongue. The patient speaks in complete sentences.  Eyes: EOM and lids are normal. Pupils are equal, round, and reactive to light.  Neck: Normal range of motion. Neck supple. Carotid bruit is not present.  Cardiovascular: Normal rate, regular rhythm, normal heart sounds, intact distal pulses and normal pulses.   Pulmonary/Chest: Breath sounds normal. No respiratory distress.  Abdominal: Soft. Bowel sounds are normal. There is no tenderness. There is no guarding.  Musculoskeletal: Normal range of motion.  Lymphadenopathy:       Head (right side): No submandibular adenopathy present.       Head (left side): No submandibular adenopathy present.    She has no cervical adenopathy.  Neurological: She is alert and oriented to person, place, and time. She has normal strength. No cranial nerve deficit or sensory deficit.  Skin: Skin is warm and dry.  Psychiatric: She has a normal mood and affect. Her speech is normal.    ED Course  Procedures (including critical care time) Labs Review Labs Reviewed - No data to display  Imaging Review No results found.   EKG Interpretation None      MDM Vital signs are well within normal limits. There is no evidence of abscess at the post extraction site. Suspect the patient has dry socket. The patient will be asked to continue the antibiotic. Will treat her with diclofenac and Norco 7.5 mg. Patient strongly encouraged to notify the surgeon for additional and more specific management of this dry socket.    Final diagnoses:  None    **I have reviewed nursing notes, vital signs, and all appropriate lab and imaging results for this patient.Kathie Dike*    Merrilyn Legler M Nikki Glanzer, PA-C 01/13/14 1819

## 2014-02-20 ENCOUNTER — Telehealth: Payer: Self-pay | Admitting: *Deleted

## 2014-02-20 NOTE — Telephone Encounter (Signed)
Pt states "what is Dr. Despina HiddenEure going to do at her appointment tomorrow for cramping and abnormal vaginal bleeding. Informed pt unable to tell her what Dr. Despina HiddenEure would do at the appt depends on all her symptoms. Encouraged pt to keep her appt with Dr. Despina HiddenEure. Pt verbalized understanding.

## 2014-02-21 ENCOUNTER — Ambulatory Visit (INDEPENDENT_AMBULATORY_CARE_PROVIDER_SITE_OTHER): Payer: Medicaid Other | Admitting: Obstetrics & Gynecology

## 2014-02-21 ENCOUNTER — Telehealth: Payer: Self-pay | Admitting: Obstetrics & Gynecology

## 2014-02-21 ENCOUNTER — Encounter: Payer: Self-pay | Admitting: Obstetrics & Gynecology

## 2014-02-21 VITALS — BP 110/80 | Wt 148.0 lb

## 2014-02-21 DIAGNOSIS — N938 Other specified abnormal uterine and vaginal bleeding: Secondary | ICD-10-CM

## 2014-02-21 DIAGNOSIS — N949 Unspecified condition associated with female genital organs and menstrual cycle: Secondary | ICD-10-CM

## 2014-02-21 DIAGNOSIS — N719 Inflammatory disease of uterus, unspecified: Secondary | ICD-10-CM

## 2014-02-21 DIAGNOSIS — N925 Other specified irregular menstruation: Secondary | ICD-10-CM

## 2014-02-21 DIAGNOSIS — N898 Other specified noninflammatory disorders of vagina: Secondary | ICD-10-CM

## 2014-02-21 MED ORDER — KETOROLAC TROMETHAMINE 10 MG PO TABS: 10.0000 mg | ORAL_TABLET | Freq: Three times a day (TID) | ORAL | Status: DC | PRN

## 2014-02-21 MED ORDER — DOXYCYCLINE HYCLATE 50 MG PO CAPS: 100.0000 mg | ORAL_CAPSULE | Freq: Two times a day (BID) | ORAL | Status: DC

## 2014-02-21 MED ORDER — MEGESTROL ACETATE 40 MG PO TABS: ORAL_TABLET | ORAL | Status: DC

## 2014-02-21 NOTE — Telephone Encounter (Signed)
Pt asking what diagnosis she was given at her appt with Dr. Despina HiddenEure today. Pt informed per Dr. Despina HiddenEure documentation DX for today's appt is Endometritis treated with Doxycycline 50 mg 2 tablets po 2 times daily. Pt verbalized understanding.

## 2014-02-21 NOTE — Progress Notes (Signed)
Patient ID: Jessica QuailRachel N Civello, female   DOB: 03/07/1991, 23 y.o.   MRN: [id number] Chief Complaint  Patient presents with  . GYN VISIT    C/C CRAMPING / VAGINAL BLEEDING. 2 period this month.    HPI 2 periods for the month which has not happened before with "passing tissue" and severe cramps associated No fever no history of STD Pain for a couple of weeks  ROS No burning with urination, frequency or urgency No nausea, vomiting or diarrhea Nor fever chills or other constitutional symptoms   Blood pressure 110/80, weight 148 lb (67.132 kg), last menstrual period 02/19/2014, not currently breastfeeding. Current outpatient prescriptions:Norethindrone-Ethinyl Estradiol-Fe Biphas (LO LOESTRIN FE) 1 MG-10 MCG / 10 MCG tablet, Take 1 tablet by mouth daily., Disp: 1 Package, Rfl: 11;  diclofenac (VOLTAREN) 75 MG EC tablet, Take 1 tablet (75 mg total) by mouth 2 (two) times daily., Disp: 14 tablet, Rfl: 0 HYDROcodone-acetaminophen (NORCO) 7.5-325 MG per tablet, Take 1 tablet by mouth every 4 (four) hours as needed for moderate pain., Disp: 20 tablet, Rfl: 0;  oxyCODONE-acetaminophen (PERCOCET/ROXICET) 5-325 MG per tablet, Take 1 tablet by mouth every 4 (four) hours as needed for severe pain (moderate - severe pain)., Disp: 10 tablet, Rfl: 0 penicillin v potassium (VEETID) 500 MG tablet, Take 500 mg by mouth every 4 (four) hours. Patient was prescribed this medication on 01/11/2014 and to take until all gone #30, Disp: , Rfl:   EXAM Abdomen:      soft, tenderness mild, without guarding - pelvic Vulva:            normal appearing vulva with no masses, tenderness or lesions Vagina:          normal mucosa, scant blood, some discharge Cervix:           normal appearance Uterus:          uterus is normal size, shape, consistency and tender Adnexa:         normal adnexa in size, nontender and no masses, tenderness both sides Rectal:            Hemoccult:                              Wet prep: No bacterial   Vaginosis no yeast  No trich +WBC  Assessment/Plan:  Menorrhagia:  Megestrol then transition to a 3rd generation desogestrel OCP  Endometritis: doxycycline for 10 days follow up 1 month

## 2014-02-21 NOTE — Addendum Note (Signed)
Addended by: Gaylyn RongEVANS, Shneur [place] A on: 02/21/2014 11:11 AM   Modules accepted: Orders

## 2014-02-22 LAB — GC/CHLAMYDIA PROBE AMP
CT Probe RNA: NEGATIVE
GC Probe RNA: NEGATIVE

## 2014-02-23 ENCOUNTER — Telehealth: Payer: Self-pay | Admitting: Obstetrics & Gynecology

## 2014-02-23 NOTE — Telephone Encounter (Signed)
Pt states was given megace and doxycycline by Dr. Despina HiddenEure on 02/21/2014 and told to stop her oral birth control. Pt wants to know when she should restart her OCP.

## 2014-02-24 NOTE — Telephone Encounter (Signed)
She is on megace which is serving as her birth control Keep appt follow up

## 2014-02-27 NOTE — Telephone Encounter (Signed)
Left message x 3/cp 

## 2014-02-28 ENCOUNTER — Ambulatory Visit (INDEPENDENT_AMBULATORY_CARE_PROVIDER_SITE_OTHER): Payer: Medicaid Other | Admitting: Women's Health

## 2014-02-28 ENCOUNTER — Telehealth: Payer: Self-pay | Admitting: *Deleted

## 2014-02-28 DIAGNOSIS — R102 Pelvic and perineal pain: Secondary | ICD-10-CM

## 2014-02-28 DIAGNOSIS — N949 Unspecified condition associated with female genital organs and menstrual cycle: Secondary | ICD-10-CM

## 2014-02-28 DIAGNOSIS — R319 Hematuria, unspecified: Secondary | ICD-10-CM

## 2014-02-28 NOTE — Progress Notes (Signed)
Patient ID: Jessica QuailRachel N [name], female   DOB: 05/28/1991, 23 y.o.   MRN: [id number] Nurse visit only for urine culture.  Cheral MarkerKimberly R. Coleston Dirosa, CNM, [facility]BC 02/28/2014 5:03 PM

## 2014-02-28 NOTE — Progress Notes (Signed)
Pt here with pelvic pain. Dr. Despina HiddenEure wanted pt to come by and get a urine sample so we can send it off for a culture and go from there. Pt voiced understanding. JSY

## 2014-02-28 NOTE — Telephone Encounter (Signed)
Pt states spoke with after call nurse last night with c/o blood in urine, pelvic pain and pressure. Per Dr. Despina HiddenEure pt to come in today for urine sample to be sent for culture. Pt verbalized understanding.

## 2014-03-01 ENCOUNTER — Telehealth: Payer: Self-pay | Admitting: Obstetrics & Gynecology

## 2014-03-01 NOTE — Telephone Encounter (Signed)
Informed pt that urine culture results are not back yet and we will let her know when the results are in.

## 2014-03-02 LAB — URINE CULTURE: Colony Count: >=100000

## 2014-03-04 ENCOUNTER — Encounter (HOSPITAL_COMMUNITY): Payer: Self-pay | Admitting: Emergency Medicine

## 2014-03-04 ENCOUNTER — Emergency Department (HOSPITAL_COMMUNITY)
Admission: EM | Admit: 2014-03-04 | Discharge: 2014-03-05 | Disposition: A | Discharge status: Home / Self Care | Payer: Medicaid Other | Attending: Emergency Medicine | Admitting: Emergency Medicine

## 2014-03-04 DIAGNOSIS — R110 Nausea: Secondary | ICD-10-CM | POA: Diagnosis not present

## 2014-03-04 DIAGNOSIS — R109 Unspecified abdominal pain: Secondary | ICD-10-CM | POA: Diagnosis not present

## 2014-03-04 DIAGNOSIS — Z79899 Other long term (current) drug therapy: Secondary | ICD-10-CM | POA: Diagnosis not present

## 2014-03-04 DIAGNOSIS — F17200 Nicotine dependence, unspecified, uncomplicated: Secondary | ICD-10-CM | POA: Diagnosis not present

## 2014-03-04 DIAGNOSIS — R300 Dysuria: Secondary | ICD-10-CM | POA: Insufficient documentation

## 2014-03-04 DIAGNOSIS — Z87440 Personal history of urinary (tract) infections: Secondary | ICD-10-CM | POA: Insufficient documentation

## 2014-03-04 DIAGNOSIS — Z3202 Encounter for pregnancy test, result negative: Secondary | ICD-10-CM | POA: Insufficient documentation

## 2014-03-04 DIAGNOSIS — R1030 Lower abdominal pain, unspecified: Secondary | ICD-10-CM

## 2014-03-04 DIAGNOSIS — Z8659 Personal history of other mental and behavioral disorders: Secondary | ICD-10-CM | POA: Insufficient documentation

## 2014-03-04 LAB — URINALYSIS, ROUTINE W REFLEX MICROSCOPIC
Bilirubin Urine: NEGATIVE
Glucose, UA: NEGATIVE mg/dL
Hgb urine dipstick: NEGATIVE
Ketones, ur: NEGATIVE mg/dL
Leukocytes, UA: NEGATIVE
Nitrite: NEGATIVE
Protein, ur: NEGATIVE mg/dL
Specific Gravity, Urine: 1.025 (ref 1.005–1.030)
Urobilinogen, UA: 0.2 mg/dL (ref 0.0–1.0)
pH: 6 (ref 5.0–8.0)

## 2014-03-04 LAB — PREGNANCY, URINE: Preg Test, Ur: NEGATIVE

## 2014-03-04 MED ORDER — ONDANSETRON HCL 4 MG/2ML IJ SOLN
4.0000 mg | Freq: Once | INTRAMUSCULAR | Status: DC
  Filled 2014-03-04: qty 2

## 2014-03-04 MED ORDER — ONDANSETRON HCL 4 MG/2ML IJ SOLN
4.0000 mg | Freq: Once | INTRAMUSCULAR | Status: AC
  Administered 2014-03-04: 4 mg via INTRAVENOUS

## 2014-03-04 MED ORDER — MORPHINE SULFATE 2 MG/ML IJ SOLN
4.0000 mg | Freq: Once | INTRAMUSCULAR | Status: AC
  Administered 2014-03-04: 4 mg via INTRAVENOUS
  Filled 2014-03-04: qty 2

## 2014-03-04 NOTE — ED Notes (Signed)
Pt complaining of abdominal pain all over that started a hour ago. Pt states she is nauseated & burning w/ urination. Pt is being tx w/ antibiotics at this time.

## 2014-03-04 NOTE — ED Provider Notes (Signed)
CSN: [id number]     Arrival date & time 03/04/14  2145 History   First MD Initiated Contact with Patient 03/04/14 2300    This chart was scribed for Shon Baton, MD by Marica Otter, ED Scribe. This patient was seen in room APA15/APA15 and the patient's care was started at 11:21 PM.  Chief Complaint  Patient presents with  . Abdominal Pain   PCP: PROVIDER NOT IN SYSTEM  The history is provided by the patient. No language interpreter was used.   HPI Comments: Jessica [name] is a 23 y.o. female who presents to the Emergency Department complaining of intermittent lower abd pain radiating into the upper pubic region. Pt describes the pain as a stabbing pain and rates it a 9 out of 10. Pt also complains of associated nausea. However, pt denies v/d, fever. Pt denies any possibility of pregnancy and reports her last period ended two weeks ago. Pt notes that she saw her OBGYN 2 weeks ago for similar Sx and was Dx with endometriosis and started on antibiotics. She reports she had a pelvic exam at that time.  Pt denies being sexually active.  Denies any vaginal discharge.  Past Medical History  Diagnosis Date  . Abnormal Pap smear   . UTI (lower urinary tract infection) 01/12/2013  . Pregnant   . ADHD (attention deficit hyperactivity disorder)    Past Surgical History  Procedure Laterality Date  . Tooth extraction    . Cesarean section N/A 09/08/2013    Procedure: CESAREAN SECTION;  Surgeon: Tereso Newcomer, MD;  Location: [facility];  Service: Obstetrics;  Laterality: N/A;   Family History  Problem Relation Age of Onset  . Diabetes Maternal Grandmother   . Diabetes Paternal Grandmother    History  Substance Use Topics  . Smoking status: Current Every Day Smoker -- 1.00 packs/day    Types: Cigarettes  . Smokeless tobacco: Never Used  . Alcohol Use: No   OB History   Grav Para Term Preterm Abortions TAB SAB Ect Mult Living   1 1 1       1      Review of Systems  Constitutional:  Negative for fever.  Respiratory: Negative for cough, chest tightness and shortness of breath.   Cardiovascular: Negative for chest pain.  Gastrointestinal: Positive for nausea and abdominal pain. Negative for vomiting, diarrhea and constipation.  Genitourinary: Positive for dysuria.  Musculoskeletal: Negative for back pain.  Neurological: Negative for headaches.  All other systems reviewed and are negative.     Allergies  Review of patient's allergies indicates no known allergies.  Home Medications   Prior to Admission medications   Medication Sig Start Date End Date Taking? Authorizing Provider  megestrol (MEGACE) 40 MG tablet Take 40 mg by mouth daily.   Yes Historical Provider, MD  HYDROcodone-acetaminophen (NORCO/VICODIN) 5-325 MG per tablet Take 1 tablet by mouth every 6 (six) hours as needed for moderate pain or severe pain. 03/05/14   Shon Baton, MD   Triage Vitals: BP 120/87  Pulse 67  Temp(Src) 99.1 F (37.3 C) (Oral)  Resp 18  Ht 5\' 2"  (1.575 m)  Wt 151 lb (68.493 kg)  BMI 27.61 kg/m2  SpO2 100%  LMP 02/19/2014 Physical Exam  Nursing note and vitals reviewed. Constitutional: She is oriented to person, place, and time. She appears well-developed and well-nourished. No distress.  HENT:  Head: Normocephalic and atraumatic.  Cardiovascular: Normal rate, regular rhythm and normal heart sounds.   No  murmur heard. Pulmonary/Chest: Effort normal and breath sounds normal. No respiratory distress. She has no wheezes.  Abdominal: Soft. Bowel sounds are normal. She exhibits no distension. There is no tenderness. There is no rebound and no guarding.  Musculoskeletal: She exhibits no edema.  Neurological: She is alert and oriented to person, place, and time.  Skin: Skin is warm and dry.  Psychiatric: She has a normal mood and affect.    ED Course  Procedures (including critical care time) DIAGNOSTIC STUDIES: Oxygen Saturation is 100% on RA, nl by my  interpretation.    COORDINATION OF CARE: 11:24 PM-Discussed treatment plan which includes meds, labs, UA with pt at bedside and pt agreed to plan.   Labs Review Labs Reviewed  CBC WITH DIFFERENTIAL - Abnormal; Notable for the following:    RBC 5.16 (*)    Hemoglobin 15.1 (*)    All other components within normal limits  COMPREHENSIVE METABOLIC PANEL - Abnormal; Notable for the following:    Total Bilirubin <0.2 (*)    All other components within normal limits  URINALYSIS, ROUTINE W REFLEX MICROSCOPIC  PREGNANCY, URINE    Imaging Review No results found.   EKG Interpretation None      MDM   Final diagnoses:  Lower abdominal pain    Patient presents with abdominal pain. Patient reports she's had similar symptoms in the past but they have gotten worse. Recent evaluation included full exam by OB/GYN and patient was diagnosed with endometriosis per report. She was also placed on antibiotics but is unclear why. Exam is reassuring and vital signs are within normal limits. Patient was given pain and nausea medicine.  Lab work including urinalysis and urine pregnancy is within normal limits. No evidence of leukocytosis. Given benign exam, have low suspicion at this time for appendicitis, ovarian torsion, or other intra-abdominal pathology. Given patient has had similar pain in the past, this may be related to known endometriosis. Pelvic exam was not repeated given the patient reports recent examination by OB/GYN. Patient was able to tolerate by mouth. Will give patient a short course of pain medication at discharge. Patient is to followup with her OB/GYN for persistent symptoms. She was given strict return precautions.  After history, exam, and medical workup I feel the patient has been appropriately medically screened and is safe for discharge home. Pertinent diagnoses were discussed with the patient. Patient was given return precautions.   I personally performed the services described  in this documentation, which was scribed in my presence. The recorded information has been reviewed and is accurate.    Shon Batonourtney F Kavitha Lansdale, MD 03/05/14 [phone]

## 2014-03-04 NOTE — ED Notes (Signed)
Patient states lower abd pain that radiates into upper pubic region. Patient states she was seen "a few weeks ago" for abd pain and is currently on antibiotics at this time. Patient states "the pain is different now" c/o burning with urination.

## 2014-03-05 LAB — COMPREHENSIVE METABOLIC PANEL
ALT: 17 U/L (ref 0–35)
AST: 16 U/L (ref 0–37)
Albumin: 4.4 g/dL (ref 3.5–5.2)
Alkaline Phosphatase: 66 U/L (ref 39–117)
Anion gap: 14 (ref 5–15)
BUN: 14 mg/dL (ref 6–23)
CO2: 22 mEq/L (ref 19–32)
Calcium: 10 mg/dL (ref 8.4–10.5)
Chloride: 102 mEq/L (ref 96–112)
Creatinine, Ser: 0.79 mg/dL (ref 0.50–1.10)
GFR calc Af Amer: >90 mL/min (ref >90)
GFR calc non Af Amer: >90 mL/min (ref >90)
Glucose, Bld: 88 mg/dL (ref 70–99)
Potassium: 3.8 mEq/L (ref 3.7–5.3)
Sodium: 138 mEq/L (ref 137–147)
Total Bilirubin: <0.2 mg/dL — ABNORMAL LOW (ref 0.3–1.2)
Total Protein: 8.1 g/dL (ref 6.0–8.3)

## 2014-03-05 LAB — CBC WITH DIFFERENTIAL/PLATELET
Basophils Absolute: 0 10*3/uL (ref 0.0–0.1)
Basophils Relative: 0 % (ref 0–1)
Eosinophils Absolute: 0.1 10*3/uL (ref 0.0–0.7)
Eosinophils Relative: 1 % (ref 0–5)
HCT: 43.1 % (ref 36.0–46.0)
Hemoglobin: 15.1 g/dL — ABNORMAL HIGH (ref 12.0–15.0)
Lymphocytes Relative: 36 % (ref 12–46)
Lymphs Abs: 3 10*3/uL (ref 0.7–4.0)
MCH: 29.3 pg (ref 26.0–34.0)
MCHC: 35 g/dL (ref 30.0–36.0)
MCV: 83.5 fL (ref 78.0–100.0)
Monocytes Absolute: 0.4 10*3/uL (ref 0.1–1.0)
Monocytes Relative: 5 % (ref 3–12)
Neutro Abs: 4.7 10*3/uL (ref 1.7–7.7)
Neutrophils Relative %: 58 % (ref 43–77)
Platelets: 273 10*3/uL (ref 150–400)
RBC: 5.16 MIL/uL — ABNORMAL HIGH (ref 3.87–5.11)
RDW: 13 % (ref 11.5–15.5)
WBC: 8.2 10*3/uL (ref 4.0–10.5)

## 2014-03-05 MED ORDER — OXYCODONE-ACETAMINOPHEN 5-325 MG PO TABS
1.0000 | ORAL_TABLET | Freq: Once | ORAL | Status: AC
  Administered 2014-03-05: 1 via ORAL
  Filled 2014-03-05: qty 1

## 2014-03-05 MED ORDER — HYDROCODONE-ACETAMINOPHEN 5-325 MG PO TABS: 1.0000 | ORAL_TABLET | Freq: Four times a day (QID) | ORAL | Status: DC | PRN

## 2014-03-05 NOTE — ED Notes (Signed)
Pt states nausea is better pain reduced to 8/10. Pt given crackers at this time. No other needs voiced.

## 2014-03-05 NOTE — ED Notes (Signed)
Patient states pain has moved from lower abd into pelvic region. Patient still rates pain 8 on 0-10 pain scale. Patient given sprite at request. No needs voiced at this time.

## 2014-03-05 NOTE — ED Notes (Signed)
Pt alert & oriented x4, stable gait. Patient given discharge instructions, paperwork & prescription(s). Patient  instructed to stop at the registration desk to finish any additional paperwork. Patient verbalized understanding. Pt left department w/ no further questions. 

## 2014-03-05 NOTE — Discharge Instructions (Signed)
Abdominal Pain, Women °Abdominal (stomach, pelvic, or belly) pain can be caused by many things. It is important to tell your doctor: °· The location of the pain. °· Does it come and go or is it present all the time? °· Are there things that start the pain (eating certain foods, exercise)? °· Are there other symptoms associated with the pain (fever, nausea, vomiting, diarrhea)? °All of this is helpful to know when trying to find the cause of the pain. °CAUSES  °· Stomach: virus or bacteria infection, or ulcer. °· Intestine: appendicitis (inflamed appendix), regional ileitis (Crohn's disease), ulcerative colitis (inflamed colon), irritable bowel syndrome, diverticulitis (inflamed diverticulum of the colon), or cancer of the stomach or intestine. °· Gallbladder disease or stones in the gallbladder. °· Kidney disease, kidney stones, or infection. °· Pancreas infection or cancer. °· Fibromyalgia (pain disorder). °· Diseases of the female organs: °¨ Uterus: fibroid (non-cancerous) tumors or infection. °¨ Fallopian tubes: infection or tubal pregnancy. °¨ Ovary: cysts or tumors. °¨ Pelvic adhesions (scar tissue). °¨ Endometriosis (uterus lining tissue growing in the pelvis and on the pelvic organs). °¨ Pelvic congestion syndrome (female organs filling up with blood just before the menstrual period). °¨ Pain with the menstrual period. °¨ Pain with ovulation (producing an egg). °¨ Pain with an IUD (intrauterine device, birth control) in the uterus. °¨ Cancer of the female organs. °· Functional pain (pain not caused by a disease, may improve without treatment). °· Psychological pain. °· Depression. °DIAGNOSIS  °Your doctor will decide the seriousness of your pain by doing an examination. °· Blood tests. °· X-rays. °· Ultrasound. °· CT scan (computed tomography, special type of X-ray). °· MRI (magnetic resonance imaging). °· Cultures, for infection. °· Barium enema (dye inserted in the large intestine, to better view it with  X-rays). °· Colonoscopy (looking in intestine with a lighted tube). °· Laparoscopy (minor surgery, looking in abdomen with a lighted tube). °· Major abdominal exploratory surgery (looking in abdomen with a large incision). °TREATMENT  °The treatment will depend on the cause of the pain.  °· Many cases can be observed and treated at home. °· Over-the-counter medicines recommended by your caregiver. °· Prescription medicine. °· Antibiotics, for infection. °· Birth control pills, for painful periods or for ovulation pain. °· Hormone treatment, for endometriosis. °· Nerve blocking injections. °· Physical therapy. °· Antidepressants. °· Counseling with a psychologist or psychiatrist. °· Minor or major surgery. °HOME CARE INSTRUCTIONS  °· Do not take laxatives, unless directed by your caregiver. °· Take over-the-counter pain medicine only if ordered by your caregiver. Do not take aspirin because it can cause an upset stomach or bleeding. °· Try a clear liquid diet (broth or water) as ordered by your caregiver. Slowly move to a bland diet, as tolerated, if the pain is related to the stomach or intestine. °· Have a thermometer and take your temperature several times a day, and record it. °· Bed rest and sleep, if it helps the pain. °· Avoid sexual intercourse, if it causes pain. °· Avoid stressful situations. °· Keep your follow-up appointments and tests, as your caregiver orders. °· If the pain does not go away with medicine or surgery, you may try: °¨ Acupuncture. °¨ Relaxation exercises (yoga, meditation). °¨ Group therapy. °¨ Counseling. °SEEK MEDICAL CARE IF:  °· You notice certain foods cause stomach pain. °· Your home care treatment is not helping your pain. °· You need stronger pain medicine. °· You want your IUD removed. °· You feel faint or   lightheaded. °· You develop nausea and vomiting. °· You develop a rash. °· You are having side effects or an allergy to your medicine. °SEEK IMMEDIATE MEDICAL CARE IF:  °· Your  pain does not go away or gets worse. °· You have a fever. °· Your pain is felt only in portions of the abdomen. The right side could possibly be appendicitis. The left lower portion of the abdomen could be colitis or diverticulitis. °· You are passing blood in your stools (bright red or black tarry stools, with or without vomiting). °· You have blood in your urine. °· You develop chills, with or without a fever. °· You pass out. °MAKE SURE YOU:  °· Understand these instructions. °· Will watch your condition. °· Will get help right away if you are not doing well or get worse. °Document Released: 05/25/2007 Document Revised: 12/12/2013 Document Reviewed: 06/14/2009 °[employer]® Patient Information ©2015 [employer]. This information is not intended to replace advice given to you by your health care provider. Make sure you discuss any questions you have with your health care provider. ° °

## 2014-03-07 ENCOUNTER — Telehealth: Payer: Self-pay | Admitting: *Deleted

## 2014-03-07 ENCOUNTER — Other Ambulatory Visit: Payer: Self-pay | Admitting: Women's Health

## 2014-03-07 MED ORDER — AMPICILLIN 500 MG PO CAPS: 500.0000 mg | ORAL_CAPSULE | Freq: Four times a day (QID) | ORAL | Status: DC

## 2014-03-07 NOTE — Telephone Encounter (Signed)
lmom 

## 2014-03-07 NOTE — Telephone Encounter (Signed)
Pt informed urine culture results + for UTI. [facility]iotic [facility] Joellyn HaffKim Booker[facility] [place] Pt verbalized understanding.

## 2014-03-10 ENCOUNTER — Telehealth: Payer: Self-pay | Admitting: *Deleted

## 2014-03-10 NOTE — Telephone Encounter (Signed)
Pt states unable to sleep, abdominal pain, being treated for UTI states taken ABX. Pt states "[facility] and was sent home without any medication for pain and was told did not have an infection." Offered pt an appt for first of next week, pt states would call back at a later time.

## 2014-03-16 NOTE — Telephone Encounter (Signed)
Duplicate message. 

## 2014-03-21 ENCOUNTER — Ambulatory Visit (INDEPENDENT_AMBULATORY_CARE_PROVIDER_SITE_OTHER): Payer: Medicaid Other | Admitting: Obstetrics & Gynecology

## 2014-03-21 ENCOUNTER — Encounter: Payer: Self-pay | Admitting: Obstetrics & Gynecology

## 2014-03-21 VITALS — BP 130/80 | Wt 145.0 lb

## 2014-03-21 DIAGNOSIS — Z1389 Encounter for screening for other disorder: Secondary | ICD-10-CM

## 2014-03-21 DIAGNOSIS — N920 Excessive and frequent menstruation with regular cycle: Secondary | ICD-10-CM

## 2014-03-21 DIAGNOSIS — Z3202 Encounter for pregnancy test, result negative: Secondary | ICD-10-CM

## 2014-03-21 DIAGNOSIS — M549 Dorsalgia, unspecified: Secondary | ICD-10-CM

## 2014-03-21 LAB — POCT URINALYSIS DIPSTICK
Blood, UA: 3
Glucose, UA: NEGATIVE
Ketones, UA: NEGATIVE
Leukocytes, UA: NEGATIVE
Nitrite, UA: NEGATIVE

## 2014-03-21 LAB — POCT URINE PREGNANCY: Preg Test, Ur: NEGATIVE

## 2014-03-21 MED ORDER — DESOGESTREL-ETHINYL ESTRADIOL 0.15-30 MG-MCG PO TABS: 1.0000 | ORAL_TABLET | Freq: Every day | ORAL | Status: DC

## 2014-03-21 MED ORDER — CYCLOBENZAPRINE HCL 10 MG PO TABS: 10.0000 mg | ORAL_TABLET | Freq: Three times a day (TID) | ORAL | Status: DC | PRN

## 2014-03-21 NOTE — Progress Notes (Signed)
Patient ID: Jessica QuailRachel N [name], female   DOB: 01/28/1991, 23 y.o.   MRN: [id number] Chief Complaint  Patient presents with  . GYN VISIT    WANT BIRHT CONTROL. C/C BACK HURT. UNABLE TO SLEEP.    HPI Pt prematurely stopped her megace for what reason she and I am unsure of why She has muscle spasm in her back which is nothing new, she needs to do back stretching exercises  ROS No burning with urination, frequency or urgency No nausea, vomiting or diarrhea Nor fever chills or other constitutional symptoms   Blood pressure 130/80, weight 145 lb (65.772 kg), last menstrual period 02/19/2014, not currently breastfeeding.  EXAM + back triggers  Assessment/Plan:  Reviewed exercises and local care including ball rolling and Rx for flexeril Pt has what i think is an unhealthy desire to be on pain meds and has gotten narcotics from other ources in the past That behaoir is also discouraged No narcotics for pt

## 2014-03-31 ENCOUNTER — Emergency Department (HOSPITAL_COMMUNITY): Payer: Medicaid Other

## 2014-03-31 ENCOUNTER — Encounter (HOSPITAL_COMMUNITY): Payer: Self-pay | Admitting: Emergency Medicine

## 2014-03-31 ENCOUNTER — Emergency Department (HOSPITAL_COMMUNITY)
Admission: EM | Admit: 2014-03-31 | Discharge: 2014-04-01 | Disposition: A | Discharge status: Home / Self Care | Payer: Medicaid Other | Attending: Emergency Medicine | Admitting: Emergency Medicine

## 2014-03-31 DIAGNOSIS — R079 Chest pain, unspecified: Secondary | ICD-10-CM | POA: Insufficient documentation

## 2014-03-31 DIAGNOSIS — Z87440 Personal history of urinary (tract) infections: Secondary | ICD-10-CM | POA: Insufficient documentation

## 2014-03-31 DIAGNOSIS — Z792 Long term (current) use of antibiotics: Secondary | ICD-10-CM | POA: Diagnosis not present

## 2014-03-31 DIAGNOSIS — F17200 Nicotine dependence, unspecified, uncomplicated: Secondary | ICD-10-CM | POA: Diagnosis not present

## 2014-03-31 DIAGNOSIS — Z8659 Personal history of other mental and behavioral disorders: Secondary | ICD-10-CM | POA: Insufficient documentation

## 2014-03-31 DIAGNOSIS — Z79899 Other long term (current) drug therapy: Secondary | ICD-10-CM | POA: Insufficient documentation

## 2014-03-31 DIAGNOSIS — R1013 Epigastric pain: Secondary | ICD-10-CM | POA: Insufficient documentation

## 2014-03-31 DIAGNOSIS — R0602 Shortness of breath: Secondary | ICD-10-CM | POA: Insufficient documentation

## 2014-03-31 DIAGNOSIS — R002 Palpitations: Secondary | ICD-10-CM | POA: Diagnosis not present

## 2014-03-31 MED ORDER — SUCRALFATE 1 GM/10ML PO SUSP
1.0000 g | Freq: Three times a day (TID) | ORAL | Status: DC
  Administered 2014-04-01: 1 g via ORAL
  Filled 2014-03-31 (×6): qty 10

## 2014-03-31 NOTE — ED Provider Notes (Signed)
CSN: [id number]     Arrival date & time 03/31/14  2238 History  This chart was scribed for Lyanne Co, MD, by Yevette Edwards, ED Scribe. This patient was seen in room APA02/APA02 and the patient's care was started at 11:42 PM.   None    Chief Complaint  Patient presents with  . Chest Pain    The history is provided by the patient. No language interpreter was used.   HPI Comments: Jessica [name] is a 23 y.o. female who presents to the Emergency Department complaining of three hours of sternal chest pain which began this evening while the pt was watching television; the pain continues in the ED. The pt characterizes the pain as "stabbing," and she reports the pain is increased with deep inspiration. She states the pain was associated with the sensation of her heart racing; she states the episode of heart-racing lasted approximately an hour. She also reports mild SOB associated with the chest pain. She denies numbness to her fingertips or lips, nausea, or emesis. She also denies a h/o gallstones. Additionally she denies a h/o PE or DVTs, though she endorses a family h/o blood clots stating her grandmother has blood clots. She reports increased [employer] consumption. She has a 57 month old son.  Jessica [name] is a current smoker.   Past Medical History  Diagnosis Date  . Abnormal Pap smear   . UTI (lower urinary tract infection) 01/12/2013  . Pregnant   . ADHD (attention deficit hyperactivity disorder)    Past Surgical History  Procedure Laterality Date  . Tooth extraction    . Cesarean section N/A 09/08/2013    Procedure: CESAREAN SECTION;  Surgeon: Tereso Newcomer, MD;  Location: [facility];  Service: Obstetrics;  Laterality: N/A;   Family History  Problem Relation Age of Onset  . Diabetes Maternal Grandmother   . Diabetes Paternal Grandmother    History  Substance Use Topics  . Smoking status: Current Every Day Smoker -- 1.00 packs/day    Types: Cigarettes  . Smokeless tobacco:  Never Used  . Alcohol Use: No   OB History   Grav Para Term Preterm Abortions TAB SAB Ect Mult Living   1 1 1       1      Review of Systems  A complete 10 system review of systems was obtained, and all systems were negative except where indicated in the HPI and PE.    Allergies  Review of patient's allergies indicates no known allergies.  Home Medications   Prior to Admission medications   Medication Sig Start Date End Date Taking? Authorizing Provider  ampicillin (PRINCIPEN) 500 MG capsule Take 1 capsule (500 mg total) by mouth 4 (four) times daily. X 7 days 03/07/14   Marge Duncans, CNM  cyclobenzaprine (FLEXERIL) 10 MG tablet Take 1 tablet (10 mg total) by mouth every 8 (eight) hours as needed for muscle spasms. 03/21/14   [name], MD  desogestrel-ethinyl estradiol (APRI,EMOQUETTE,SOLIA) 0.15-30 MG-MCG tablet Take 1 tablet by mouth daily. 03/21/14   [name], MD  HYDROcodone-acetaminophen (NORCO/VICODIN) 5-325 MG per tablet Take 1 tablet by mouth every 6 (six) hours as needed for moderate pain or severe pain. 03/05/14   Shon Baton, MD  megestrol (MEGACE) 40 MG tablet Take 40 mg by mouth daily.    Historical Provider, MD   Triage Vitals: BP 118/85  Pulse 114  Temp(Src) 98.5 F (36.9 C) (Oral)  Ht 5\' 2"  (1.575  m)  Wt 150 lb (68.04 kg)  BMI 27.43 kg/m2  SpO2 99%  LMP 03/15/2014  Physical Exam  Constitutional: She is oriented to person, place, and time. She appears well-developed and well-nourished. No distress.  HENT:  Head: Normocephalic and atraumatic.  Eyes: EOM are normal. Right eye exhibits no discharge. Left eye exhibits no discharge.  Neck: Normal range of motion.  Cardiovascular: Normal rate, regular rhythm and normal heart sounds.   No murmur heard. Pulmonary/Chest: Effort normal and breath sounds normal. No respiratory distress. She has no wheezes. She has no rales.  Abdominal: There is no rebound and no guarding.  Mild epigastric  tenderness.  No guarding or rebound  Musculoskeletal: She exhibits no edema.  No unilateral leg swelling.  No edema.   Neurological: She is alert and oriented to person, place, and time.  Skin: Skin is warm and dry.  Psychiatric: She has a normal mood and affect. Her behavior is normal.    ED Course  Procedures (including critical care time)  DIAGNOSTIC STUDIES: Oxygen Saturation is 99% on room air, normal by my interpretation.    COORDINATION OF CARE:  11:51 PM- Discussed treatment plan with patient, and the patient agreed to the plan. The plan includes lab work and imaging. The pt requests pain medication.   Labs Review Labs Reviewed  CBC  COMPREHENSIVE METABOLIC PANEL  LIPASE, BLOOD  D-DIMER, QUANTITATIVE    Imaging Review Dg Chest 2 View  04/01/2014   CLINICAL DATA:  Chest pain  EXAM: CHEST  2 VIEW  COMPARISON:  04/15/2013  FINDINGS: Normal heart size and mediastinal contours. No acute infiltrate or edema. No effusion or pneumothorax. Cervical ribs. No acute osseous findings.  IMPRESSION: No active cardiopulmonary disease.   Electronically Signed   By: Tiburcio PeaJonathan  Watts M.D.   On: 04/01/2014 00:07  I personally reviewed the imaging tests through PACS system I reviewed available ER/hospitalization records through the EMR    EKG Interpretation   Date/Time:  Friday March 31 2014 22:45:32 EDT Ventricular Rate:  110 PR Interval:  180 QRS Duration: 75 QT Interval:  324 QTC Calculation: 438 R Axis:   86 Text Interpretation:  Sinus tachycardia Borderline repolarization  abnormality Confirmed by DELOS  MD, DOUGLAS ([id number]) on 03/31/2014 11:00:29  PM      MDM   Final diagnoses:  Palpitations    Transient palpitations.  No syncope.  Mild epigastric discomfort.  LFTs and lipase normal.  Overall well-appearing.  May represent gastroesophageal reflux disease.  Outpatient primary care followup.  May benefit from a Holter she continues to have palpitations.  I suspect the  majority of her palpitations are secondary to excessive caffeine use.  I recommended that she stop using caffeine.  Doubt biliary colic.  Low suspicion of her gallstones.  No indication for imaging at this time  I personally performed the services described in this documentation, which was scribed in my presence. The recorded information has been reviewed and is accurate.      Lyanne CoKevin M Galen Malkowski, MD 04/01/14 [phone]

## 2014-03-31 NOTE — ED Notes (Signed)
Patient reports central chest pain that started approximately an hour ago. Reports pain worsens with deep breathing, denies other symptoms.

## 2014-04-01 LAB — COMPREHENSIVE METABOLIC PANEL
ALT: 10 U/L (ref 0–35)
AST: 15 U/L (ref 0–37)
Albumin: 3.9 g/dL (ref 3.5–5.2)
Alkaline Phosphatase: 53 U/L (ref 39–117)
Anion gap: 15 (ref 5–15)
BUN: 10 mg/dL (ref 6–23)
CO2: 20 mEq/L (ref 19–32)
Calcium: 9.5 mg/dL (ref 8.4–10.5)
Chloride: 105 mEq/L (ref 96–112)
Creatinine, Ser: 0.74 mg/dL (ref 0.50–1.10)
GFR calc Af Amer: >90 mL/min (ref >90)
GFR calc non Af Amer: >90 mL/min (ref >90)
Glucose, Bld: 97 mg/dL (ref 70–99)
Potassium: 3.9 mEq/L (ref 3.7–5.3)
Sodium: 140 mEq/L (ref 137–147)
Total Bilirubin: <0.2 mg/dL — ABNORMAL LOW (ref 0.3–1.2)
Total Protein: 7.2 g/dL (ref 6.0–8.3)

## 2014-04-01 LAB — CBC
HCT: 37.7 % (ref 36.0–46.0)
Hemoglobin: 13.1 g/dL (ref 12.0–15.0)
MCH: 28.5 pg (ref 26.0–34.0)
MCHC: 34.7 g/dL (ref 30.0–36.0)
MCV: 82.1 fL (ref 78.0–100.0)
Platelets: 251 10*3/uL (ref 150–400)
RBC: 4.59 MIL/uL (ref 3.87–5.11)
RDW: 12.5 % (ref 11.5–15.5)
WBC: 6.9 10*3/uL (ref 4.0–10.5)

## 2014-04-01 LAB — LIPASE, BLOOD: Lipase: 56 U/L (ref 11–59)

## 2014-04-01 LAB — D-DIMER, QUANTITATIVE: D-Dimer, Quant: <0.27 ug/mL-FEU (ref 0.00–0.48)

## 2014-04-01 MED ORDER — OXYCODONE-ACETAMINOPHEN 5-325 MG PO TABS
1.0000 | ORAL_TABLET | Freq: Once | ORAL | Status: AC
  Administered 2014-04-01: 1 via ORAL
  Filled 2014-04-01: qty 1

## 2014-04-01 MED ORDER — MORPHINE SULFATE 4 MG/ML IJ SOLN
4.0000 mg | Freq: Once | INTRAMUSCULAR | Status: AC
  Administered 2014-04-01: 4 mg via INTRAVENOUS

## 2014-04-01 MED ORDER — OMEPRAZOLE 20 MG PO CPDR: 20.0000 mg | DELAYED_RELEASE_CAPSULE | Freq: Every day | ORAL | Status: DC

## 2014-04-01 MED ORDER — MORPHINE SULFATE 4 MG/ML IJ SOLN
INTRAMUSCULAR | Status: AC
  Filled 2014-04-01: qty 1

## 2014-04-01 MED ORDER — ACETAMINOPHEN 325 MG PO TABS: 650.0000 mg | ORAL_TABLET | Freq: Four times a day (QID) | ORAL | Status: DC | PRN

## 2014-04-10 ENCOUNTER — Telehealth: Payer: Self-pay | Admitting: Obstetrics & Gynecology

## 2014-04-10 NOTE — Telephone Encounter (Signed)
Pt states by 3 weeks "stinging at incision." pt states stinging comes and goes. Informed pt per Dr. Despina Hidden, c-section 08/2013, does not feel stinging is due to incision site. Pt to f/u with PCP. Pt verbalized understanding.

## 2014-04-22 ENCOUNTER — Encounter (HOSPITAL_COMMUNITY): Payer: Self-pay | Admitting: Emergency Medicine

## 2014-04-22 ENCOUNTER — Emergency Department (HOSPITAL_COMMUNITY)
Admission: EM | Admit: 2014-04-22 | Discharge: 2014-04-22 | Disposition: A | Discharge status: Home / Self Care | Payer: Medicaid Other | Attending: Emergency Medicine | Admitting: Emergency Medicine

## 2014-04-22 DIAGNOSIS — R05 Cough: Secondary | ICD-10-CM | POA: Diagnosis not present

## 2014-04-22 DIAGNOSIS — Z79899 Other long term (current) drug therapy: Secondary | ICD-10-CM | POA: Insufficient documentation

## 2014-04-22 DIAGNOSIS — H9209 Otalgia, unspecified ear: Secondary | ICD-10-CM | POA: Insufficient documentation

## 2014-04-22 DIAGNOSIS — F17200 Nicotine dependence, unspecified, uncomplicated: Secondary | ICD-10-CM | POA: Diagnosis not present

## 2014-04-22 DIAGNOSIS — Z8659 Personal history of other mental and behavioral disorders: Secondary | ICD-10-CM | POA: Insufficient documentation

## 2014-04-22 DIAGNOSIS — H65191 Other acute nonsuppurative otitis media, right ear: Secondary | ICD-10-CM

## 2014-04-22 DIAGNOSIS — R059 Cough, unspecified: Secondary | ICD-10-CM | POA: Insufficient documentation

## 2014-04-22 DIAGNOSIS — H65199 Other acute nonsuppurative otitis media, unspecified ear: Secondary | ICD-10-CM | POA: Insufficient documentation

## 2014-04-22 DIAGNOSIS — Z87440 Personal history of urinary (tract) infections: Secondary | ICD-10-CM | POA: Insufficient documentation

## 2014-04-22 MED ORDER — TRAMADOL HCL 50 MG PO TABS
50.0000 mg | ORAL_TABLET | Freq: Once | ORAL | Status: AC
  Administered 2014-04-22: 50 mg via ORAL
  Filled 2014-04-22: qty 1

## 2014-04-22 MED ORDER — AMOXICILLIN 250 MG PO CAPS
500.0000 mg | ORAL_CAPSULE | Freq: Once | ORAL | Status: AC
  Administered 2014-04-22: 500 mg via ORAL
  Filled 2014-04-22: qty 2

## 2014-04-22 MED ORDER — AMOXICILLIN 500 MG PO CAPS: 500.0000 mg | ORAL_CAPSULE | Freq: Three times a day (TID) | ORAL | Status: DC

## 2014-04-22 MED ORDER — ANTIPYRINE-BENZOCAINE 5.4-1.4 % OT SOLN
3.0000 [drp] | Freq: Once | OTIC | Status: AC
  Administered 2014-04-22: 3 [drp] via OTIC
  Filled 2014-04-22: qty 10

## 2014-04-22 NOTE — ED Provider Notes (Signed)
CSN: [id number]     Arrival date & time 04/22/14  2131 History   First MD Initiated Contact with Patient 04/22/14 2203     Chief Complaint  Patient presents with  . Otalgia     (Consider location/radiation/quality/duration/timing/severity/associated sxs/prior Treatment) HPI   Jessica [name] is a 23 y.o. female who presents to the Emergency Department complaining of right ear pain.  She states she developed pain to her right ear several days ago and was seen by her doctor and told she had a ear infection.  She states that her doctor was suppose to "call me in some drops and antibiotics" four days ago but she never received them.  She also states that she called the office back yesterday but no one ever called her back.  She denies hearing loss, drainage, fever, chills, sore throat or pain with swallowing.  She states that she needs something to " kill this pain" that she has been unable to sleep due the severe pain in her ear.     Past Medical History  Diagnosis Date  . Abnormal Pap smear   . UTI (lower urinary tract infection) 01/12/2013  . Pregnant   . ADHD (attention deficit hyperactivity disorder)    Past Surgical History  Procedure Laterality Date  . Tooth extraction    . Cesarean section N/A 09/08/2013    Procedure: CESAREAN SECTION;  Surgeon: Tereso Newcomer, MD;  Location: [facility];  Service: Obstetrics;  Laterality: N/A;   Family History  Problem Relation Age of Onset  . Diabetes Maternal Grandmother   . Diabetes Paternal Grandmother    History  Substance Use Topics  . Smoking status: Current Every Day Smoker -- 1.00 packs/day    Types: Cigarettes  . Smokeless tobacco: Never Used  . Alcohol Use: No   OB History   Grav Para Term Preterm Abortions TAB SAB Ect Mult Living   Review of Systems  Constitutional: Negative for fever, chills, activity change and appetite change.  HENT: Positive for ear pain. Negative for congestion, ear discharge, facial  swelling, rhinorrhea, sore throat and trouble swallowing.   Eyes: Negative for visual disturbance.  Respiratory: Positive for cough. Negative for shortness of breath, wheezing and stridor.   Gastrointestinal: Negative for nausea and vomiting.  Musculoskeletal: Negative for neck pain and neck stiffness.  Skin: Negative.   Neurological: Negative for dizziness, weakness, numbness and headaches.  Hematological: Negative for adenopathy.  Psychiatric/Behavioral: Negative for confusion.  All other systems reviewed and are negative.     Allergies  Review of patient's allergies indicates no known allergies.  Home Medications   Prior to Admission medications   Medication Sig Start Date End Date Taking? Authorizing Provider  acetaminophen (TYLENOL) 325 MG tablet Take 2 tablets (650 mg total) by mouth every 6 (six) hours as needed. 04/01/14  Yes Lyanne Co, MD  desogestrel-ethinyl estradiol (APRI,EMOQUETTE,SOLIA) 0.15-30 MG-MCG tablet Take 1 tablet by mouth daily. 03/21/14  Yes [name], MD  amoxicillin (AMOXIL) 500 MG capsule Take 1 capsule (500 mg total) by mouth 3 (three) times daily. For 10 days 04/22/14   Peta Peachey L. Keily Lepp, PA-C   BP 128/80  Pulse 100  Temp(Src) 98.1 F (36.7 C) (Oral)  Resp 20  Ht  (1.575 m)  Wt 145 lb (65.772 kg)  BMI 26.51 kg/m2  SpO2 100%  LMP 04/19/2014 Physical Exam  Nursing note and vitals reviewed.  Constitutional: She is oriented to person, place, and time. She appears well-developed and well-nourished. No distress.  HENT:  Head: Normocephalic and atraumatic.  Mouth/Throat: Uvula is midline, oropharynx is clear and moist and mucous membranes are normal. No uvula swelling. No oropharyngeal exudate.  Slight erythema of the right TM.  Mild middle ear effusion present.  No drainage or edema of the ear canal, TM appears intact.    Neck: Normal range of motion. Neck supple.  Cardiovascular: Normal rate, regular rhythm, normal heart sounds and intact  distal pulses.   No murmur heard. Pulmonary/Chest: Effort normal and breath sounds normal. No stridor. No respiratory distress.  Lymphadenopathy:    She has no cervical adenopathy.  Neurological: She is alert and oriented to person, place, and time. Coordination normal.  Skin: Skin is warm and dry. No rash noted.    ED Course  Procedures (including critical care time) Labs Review Labs Reviewed - No data to display  Imaging Review No results found.   EKG Interpretation None      MDM   Final diagnoses:  Other acute nonsuppurative otitis media of right ear    Patient is well appearing.  VSS.  Minimal erythema of the right TM.  No perforation, bulging or tenderness of the neck.  No edema of the face.  Patient asking for pain medication.  Offered ultram and she stated to me "that don't work, I need something to kill the pain".  Offered ibuprofen but she also stated that she can't take that.  I advised her that narcotic pain medication is not indicated for an ear infection and she was given auralgan drops and rx for amoxil.  She came back to the dept twice after discharge and stated that she doesn't like having her ear numb.  Patient was advised that the drops are a numbing agent and only temporary and that she doesn't have to use them.  She was given a cotton ball and she left the dept.     Emanuela Runnion L. Trisha Mangle, PA-C 04/24/14 1611

## 2014-04-22 NOTE — Discharge Instructions (Signed)
Otitis Media °Otitis media is redness, soreness, and puffiness (swelling) in the space just behind your eardrum (middle ear). It may be caused by allergies or infection. It often happens along with a cold. °HOME CARE °· Take your medicine as told. Finish it even if you start to feel better. °· Only take over-the-counter or prescription medicines for pain, discomfort, or fever as told by your doctor. °· Follow up with your doctor as told. °GET HELP IF: °· You have otitis media only in one ear, or bleeding from your nose, or both. °· You notice a lump on your neck. °· You are not getting better in 3-5 days. °· You feel worse instead of better. °GET HELP RIGHT AWAY IF:  °· You have pain that is not helped with medicine. °· You have puffiness, redness, or pain around your ear. °· You get a stiff neck. °· You cannot move part of your face (paralysis). °· You notice that the bone behind your ear hurts when you touch it. °MAKE SURE YOU:  °· Understand these instructions. °· Will watch your condition. °· Will get help right away if you are not doing well or get worse. °Document Released: 01/14/2008 Document Revised: 08/02/2013 Document Reviewed: 02/22/2013 °[employer]® Patient Information ©2015 [employer]. This information is not intended to replace advice given to you by your health care provider. Make sure you discuss any questions you have with your health care provider. ° °

## 2014-04-22 NOTE — ED Notes (Signed)
Pt c/o right ear pain. Was "supposed to have ears drops and Amoxicillin four days ago but they never called it in".

## 2014-04-25 NOTE — ED Provider Notes (Signed)
Medical screening examination/treatment/procedure(s) were performed by non-physician practitioner and as supervising physician I was immediately available for consultation/collaboration.   EKG Interpretation None        Benny Lennert, MD 04/25/14 1356

## 2014-04-27 ENCOUNTER — Emergency Department (HOSPITAL_COMMUNITY)
Admission: EM | Admit: 2014-04-27 | Discharge: 2014-04-27 | Disposition: A | Discharge status: Home / Self Care | Payer: Medicaid Other | Attending: Emergency Medicine | Admitting: Emergency Medicine

## 2014-04-27 ENCOUNTER — Encounter (HOSPITAL_COMMUNITY): Payer: Self-pay | Admitting: Emergency Medicine

## 2014-04-27 DIAGNOSIS — Y9289 Other specified places as the place of occurrence of the external cause: Secondary | ICD-10-CM | POA: Insufficient documentation

## 2014-04-27 DIAGNOSIS — R296 Repeated falls: Secondary | ICD-10-CM | POA: Diagnosis not present

## 2014-04-27 DIAGNOSIS — M549 Dorsalgia, unspecified: Secondary | ICD-10-CM | POA: Diagnosis not present

## 2014-04-27 DIAGNOSIS — N390 Urinary tract infection, site not specified: Secondary | ICD-10-CM | POA: Diagnosis not present

## 2014-04-27 DIAGNOSIS — G8929 Other chronic pain: Secondary | ICD-10-CM | POA: Diagnosis not present

## 2014-04-27 DIAGNOSIS — Z792 Long term (current) use of antibiotics: Secondary | ICD-10-CM | POA: Diagnosis not present

## 2014-04-27 DIAGNOSIS — Z8659 Personal history of other mental and behavioral disorders: Secondary | ICD-10-CM | POA: Diagnosis not present

## 2014-04-27 DIAGNOSIS — Y9389 Activity, other specified: Secondary | ICD-10-CM | POA: Diagnosis not present

## 2014-04-27 DIAGNOSIS — IMO0001 Reserved for inherently not codable concepts without codable children: Secondary | ICD-10-CM | POA: Diagnosis not present

## 2014-04-27 DIAGNOSIS — Z79899 Other long term (current) drug therapy: Secondary | ICD-10-CM | POA: Insufficient documentation

## 2014-04-27 DIAGNOSIS — F17200 Nicotine dependence, unspecified, uncomplicated: Secondary | ICD-10-CM | POA: Diagnosis not present

## 2014-04-27 DIAGNOSIS — Z3202 Encounter for pregnancy test, result negative: Secondary | ICD-10-CM | POA: Insufficient documentation

## 2014-04-27 LAB — URINALYSIS, ROUTINE W REFLEX MICROSCOPIC
Bilirubin Urine: NEGATIVE
Glucose, UA: NEGATIVE mg/dL
Ketones, ur: NEGATIVE mg/dL
Nitrite: NEGATIVE
Protein, ur: NEGATIVE mg/dL
Specific Gravity, Urine: 1.025 (ref 1.005–1.030)
Urobilinogen, UA: 0.2 mg/dL (ref 0.0–1.0)
pH: 6 (ref 5.0–8.0)

## 2014-04-27 LAB — URINE MICROSCOPIC-ADD ON

## 2014-04-27 LAB — POC URINE PREG, ED: Preg Test, Ur: NEGATIVE

## 2014-04-27 MED ORDER — OXYCODONE-ACETAMINOPHEN 5-325 MG PO TABS
1.0000 | ORAL_TABLET | Freq: Once | ORAL | Status: AC
  Administered 2014-04-27: 1 via ORAL
  Filled 2014-04-27: qty 1

## 2014-04-27 MED ORDER — CYCLOBENZAPRINE HCL 10 MG PO TABS: 10.0000 mg | ORAL_TABLET | Freq: Two times a day (BID) | ORAL | Status: DC | PRN

## 2014-04-27 MED ORDER — NAPROXEN 375 MG PO TABS: 375.0000 mg | ORAL_TABLET | Freq: Two times a day (BID) | ORAL | Status: DC

## 2014-04-27 MED ORDER — CYCLOBENZAPRINE HCL 10 MG PO TABS
10.0000 mg | ORAL_TABLET | Freq: Once | ORAL | Status: AC
  Administered 2014-04-27: 10 mg via ORAL
  Filled 2014-04-27: qty 1

## 2014-04-27 MED ORDER — SULFAMETHOXAZOLE-TRIMETHOPRIM 800-160 MG PO TABS: 1.0000 | ORAL_TABLET | Freq: Two times a day (BID) | ORAL | Status: AC

## 2014-04-27 NOTE — ED Notes (Addendum)
Pt states lower back pain began 1 week ago.  Pt reports falling last night and again this morning, "back just collapsed and I fell."  Denies LOC . Reports dizzy before fall this am.  Ptdenies use of meds before fall.

## 2014-04-27 NOTE — ED Provider Notes (Signed)
CSN: [id number]     Arrival date & time 04/27/14  1600 History   First MD Initiated Contact with Patient 04/27/14 1644     Chief Complaint  Patient presents with  . Back Pain     (Consider location/radiation/quality/duration/timing/severity/associated sxs/prior Treatment) Patient is a 23 y.o. female presenting with back pain. The history is provided by the patient.  Back Pain Location:  Generalized Quality:  Shooting and burning Radiates to:  Does not radiate Pain severity:  Severe Pain is:  Same all the time Onset quality:  Gradual Duration:  1 week Timing:  Constant Progression:  Worsening Relieved by:  Nothing Worsened by:  Movement, standing and twisting Ineffective treatments:  Ibuprofen and OTC medications Associated symptoms: no abdominal pain, no bladder incontinence, no bowel incontinence, no chest pain, no dysuria, no fever and no headaches    Jessica [name] is a 23 y.o. female who presents to the ED with back pain that has been ongoing for the past week. The pain has gotten worse and was so bad today it caused her to fall. She called EMS to bring her to the ED. She has a history of chronic back pain since her epidural 08/2013 when she had her son. This is similar to pain that she has had off and on but today she thinks it may be her kidneys.   Past Medical History  Diagnosis Date  . Abnormal Pap smear   . UTI (lower urinary tract infection) 01/12/2013  . ADHD (attention deficit hyperactivity disorder)    Past Surgical History  Procedure Laterality Date  . Tooth extraction    . Cesarean section N/A 09/08/2013    Procedure: CESAREAN SECTION;  Surgeon: Tereso Newcomer, MD;  Location: [facility];  Service: Obstetrics;  Laterality: N/A;   Family History  Problem Relation Age of Onset  . Diabetes Maternal Grandmother   . Diabetes Paternal Grandmother    History  Substance Use Topics  . Smoking status: Current Every Day Smoker -- 1.00 packs/day    Types: Cigarettes  .  Smokeless tobacco: Never Used  . Alcohol Use: No   OB History   Grav Para Term Preterm Abortions TAB SAB Ect Mult Living   Review of Systems  Constitutional: Negative for fever and chills.  HENT: Negative.   Eyes: Negative for visual disturbance.  Respiratory: Negative for cough, shortness of breath and wheezing.   Cardiovascular: Negative for chest pain and palpitations.  Gastrointestinal: Negative for nausea, vomiting, abdominal pain and bowel incontinence.  Genitourinary: Negative for bladder incontinence, dysuria, urgency, frequency, vaginal bleeding and vaginal discharge.  Musculoskeletal: Positive for back pain and myalgias. Negative for joint swelling and neck pain.  Skin: Negative for rash.  Neurological: Negative for headaches.  Psychiatric/Behavioral: Negative for confusion. The patient is not nervous/anxious.       Allergies  Review of patient's allergies indicates no known allergies.  Home Medications   Prior to Admission medications   Medication Sig Start Date End Date Taking? Authorizing Provider  acetaminophen (TYLENOL) 325 MG tablet Take 2 tablets (650 mg total) by mouth every 6 (six) hours as needed. 04/01/14  Yes Lyanne Co, MD  amoxicillin (AMOXIL) 500 MG capsule Take 1 capsule (500 mg total) by mouth 3 (three) times daily. For 10 days 04/22/14  Yes Tammy L. Triplett, PA-C  desogestrel-ethinyl estradiol (APRI,EMOQUETTE,SOLIA) 0.15-30 MG-MCG tablet Take 1 tablet by mouth daily. 03/21/14  Yes [name], MD   BP 120/81  Pulse 99  Temp(Src) 99.1 F (37.3 C) (Oral)  Resp 18  Ht  (1.575 m)  Wt 145 lb (65.772 kg)  BMI 26.51 kg/m2  SpO2 100%  LMP 04/19/2014  Breastfeeding? No Physical Exam  Nursing note and vitals reviewed. Constitutional: She is oriented to person, place, and time. She appears well-developed and well-nourished. No distress.  HENT:  Head: Normocephalic and atraumatic.  Right Ear: Tympanic membrane normal.    Left Ear: Tympanic membrane normal.  Nose: Nose normal.  Mouth/Throat: Uvula is midline, oropharynx is clear and moist and mucous membranes are normal.  Eyes: EOM are normal.  Neck: Normal range of motion. Neck supple.  Cardiovascular: Normal rate and regular rhythm.   Pulmonary/Chest: Effort normal. She has no wheezes. She has no rales.  Abdominal: Soft. Bowel sounds are normal. There is no tenderness.  Musculoskeletal:       Lumbar back: She exhibits tenderness, pain and spasm. She exhibits normal pulse.       Back:  Pedal pulses equal, adequate circulation, good touch sensation. Straight leg raises without difficulty or pain.   Neurological: She is alert and oriented to person, place, and time. She has normal strength. No cranial nerve deficit or sensory deficit. Gait normal.  Reflex Scores:      Bicep reflexes are 2+ on the right side and 2+ on the left side.      Brachioradialis reflexes are 2+ on the right side and 2+ on the left side.      Patellar reflexes are 2+ on the right side and 2+ on the left side.      Achilles reflexes are 2+ on the right side and 2+ on the left side. Skin: Skin is warm and dry.  Psychiatric: She has a normal mood and affect. Her behavior is normal.    ED Course  Procedures (including critical care time) Labs Review Results for orders placed during the hospital encounter of 04/27/14 (from the past 24 hour(s))  URINALYSIS, ROUTINE W REFLEX MICROSCOPIC     Status: Abnormal   Collection Time    04/27/14  5:00 PM      Result Value Ref Range   Color, Urine YELLOW  YELLOW   APPearance CLEAR  CLEAR   Specific Gravity, Urine 1.025  1.005 - 1.030   pH 6.0  5.0 - 8.0   Glucose, UA NEGATIVE  NEGATIVE mg/dL   Hgb urine dipstick TRACE (*) NEGATIVE   Bilirubin Urine NEGATIVE  NEGATIVE   Ketones, ur NEGATIVE  NEGATIVE mg/dL   Protein, ur NEGATIVE  NEGATIVE mg/dL   Urobilinogen, UA 0.2  0.0 - 1.0 mg/dL   Nitrite NEGATIVE  NEGATIVE   Leukocytes, UA SMALL  (*) NEGATIVE  URINE MICROSCOPIC-ADD ON     Status: Abnormal   Collection Time    04/27/14  5:00 PM      Result Value Ref Range   Squamous Epithelial / LPF MANY (*) RARE   WBC, UA 3-6  <3 WBC/hpf   RBC / HPF 0-2  <3 RBC/hpf   Bacteria, UA MANY (*) RARE  POC URINE PREG, ED     Status: None   Collection Time    04/27/14  5:03 PM      Result Value Ref Range   Preg Test, Ur NEGATIVE  NEGATIVE     MDM  23 y.o. female with chronic back pain with increased pain today. Stable for discharge with normal  neuro exam. Will treat for UTI and she will follow up with her doctor as needed. Urine sent for culture. She has muscle relaxants and pain medication at home that she will use.    Medication List    TAKE these medications       naproxen 375 MG tablet  Commonly known as:  NAPROSYN  Take 1 tablet (375 mg total) by mouth 2 (two) times daily.     sulfamethoxazole-trimethoprim 800-160 MG per tablet  Commonly known as:  BACTRIM DS,SEPTRA DS  Take 1 tablet by mouth 2 (two) times daily.      ASK your doctor about these medications       acetaminophen 325 MG tablet  Commonly known as:  TYLENOL  Take 2 tablets (650 mg total) by mouth every 6 (six) hours as needed.     amoxicillin 500 MG capsule  Commonly known as:  AMOXIL  Take 1 capsule (500 mg total) by mouth 3 (three) times daily. For 10 days     desogestrel-ethinyl estradiol 0.15-30 MG-MCG tablet  Commonly known as:  APRI,EMOQUETTE,SOLIA  Take 1 tablet by mouth daily.          [address] [place], [place] 04/28/14 [phone]

## 2014-04-27 NOTE — Discharge Instructions (Signed)
We are treating for what appears to be an early urinary tract infection. We have sent the urine for culture. If we need to change your medications we will call you. The UTI is most likely NOT the cause of your back pain. Do not take the muscle relaxant if you are driving as it will make you sleepy.

## 2014-04-27 NOTE — ED Notes (Signed)
Low back pain for 1 week, says she fell this am due to back problem.   Came in by EMS

## 2014-04-27 NOTE — ED Notes (Signed)
PA at bedside.

## 2014-04-29 LAB — URINE CULTURE: Colony Count: >=100000

## 2014-04-30 ENCOUNTER — Telehealth (HOSPITAL_COMMUNITY): Payer: Self-pay

## 2014-04-30 NOTE — ED Notes (Signed)
Post ED Visit - Positive Culture Follow-up: Successful Patient Follow-Up  Culture assessed and recommendations reviewed by:  Wes Dulaney, Pharm.D., BCPS  Celedonio Miyamoto, Pharm.D., BCPS  Georgina Pillion, Pharm.D., BCPS  [place], [address].D., BCPS, AAHIVP  Estella Husk, Pharm.D., BCPS, AAHIVP  Red Christians, Pharm.D.  Tennis Must, [place].D.  Positive urine culture   Patient discharged without antimicrobial prescription and treatment is now indicated  Organism is resistant to prescribed ED discharge antimicrobial  Patient with positive blood cultures  Changes discussed with ED provider:marissa Sciacca New antibiotic prescription D/C septra. Start Amoxicillin  po TID x 7 days Called to [facility] [phone]  Contacted patient, date 04/30/2014, time 1215   Ashley Jacobs 04/30/2014, 12:11 PM

## 2014-04-30 NOTE — Progress Notes (Signed)
ED Antimicrobial Stewardship Positive Culture Follow Up   Jessica [name] is an 23 y.o. female who presented to [facility] on 04/27/2014 with a chief complaint of  Chief Complaint  Patient presents with  . Back Pain    Recent Results (from the past 720 hour(s))  URINE CULTURE     Status: None   Collection Time    04/27/14  5:00 PM      Result Value Ref Range Status   Specimen Description URINE, CLEAN CATCH   Final   Special Requests NONE   Final   Culture  Setup Time     Final   Value: 04/27/2014 18:32     Performed at [facility] Count     Final   Value: >=100,000 COLONIES/ML     Performed at [facility]   Culture     Final   Value: VIRIDANS STREPTOCOCCUS     Performed at [facility]   Report Status 04/29/2014 FINAL   Final     Treated with septra, organism resistant to prescribed antimicrobial 23 yo who presented with back pain but this is a chronic issue. She does have a hx UTI also. Her UA wasn't impressive. She was sent out on septra. Urine culture has come back with strep viridans now so we'll change to amoxicillin.   New antibiotic prescription:   Dc septra Start Amoxicillin  PO TID x7 days  ED Provider: Raymon Mutton, PA   Ulyses Southward, PharmD Pager: [phone] Infectious Diseases Pharmacist Phone# [phone]

## 2014-05-04 NOTE — ED Provider Notes (Signed)
Medical screening examination/treatment/procedure(s) were performed by non-physician practitioner and as supervising physician I was immediately available for consultation/collaboration.   EKG Interpretation None        Shem Plemmons, MD 05/04/14 0755 

## 2014-06-12 ENCOUNTER — Emergency Department (HOSPITAL_COMMUNITY)
Admission: EM | Admit: 2014-06-12 | Discharge: 2014-06-13 | Disposition: A | Discharge status: Home / Self Care | Payer: Medicaid Other | Attending: Emergency Medicine | Admitting: Emergency Medicine

## 2014-06-12 ENCOUNTER — Encounter (HOSPITAL_COMMUNITY): Payer: Self-pay | Admitting: *Deleted

## 2014-06-12 DIAGNOSIS — M791 Myalgia, unspecified site: Secondary | ICD-10-CM

## 2014-06-12 DIAGNOSIS — Z8659 Personal history of other mental and behavioral disorders: Secondary | ICD-10-CM | POA: Insufficient documentation

## 2014-06-12 DIAGNOSIS — Z79818 Long term (current) use of other agents affecting estrogen receptors and estrogen levels: Secondary | ICD-10-CM | POA: Diagnosis not present

## 2014-06-12 DIAGNOSIS — Z3202 Encounter for pregnancy test, result negative: Secondary | ICD-10-CM | POA: Diagnosis not present

## 2014-06-12 DIAGNOSIS — Z87448 Personal history of other diseases of urinary system: Secondary | ICD-10-CM | POA: Insufficient documentation

## 2014-06-12 DIAGNOSIS — Z792 Long term (current) use of antibiotics: Secondary | ICD-10-CM | POA: Insufficient documentation

## 2014-06-12 DIAGNOSIS — H538 Other visual disturbances: Secondary | ICD-10-CM | POA: Diagnosis present

## 2014-06-12 DIAGNOSIS — Z720 Tobacco use: Secondary | ICD-10-CM | POA: Diagnosis not present

## 2014-06-12 LAB — CBC WITH DIFFERENTIAL/PLATELET
Basophils Absolute: 0 10*3/uL (ref 0.0–0.1)
Basophils Relative: 0 % (ref 0–1)
Eosinophils Absolute: 0.1 10*3/uL (ref 0.0–0.7)
Eosinophils Relative: 1 % (ref 0–5)
HCT: 36.3 % (ref 36.0–46.0)
Hemoglobin: 12.9 g/dL (ref 12.0–15.0)
Lymphocytes Relative: 38 % (ref 12–46)
Lymphs Abs: 3 10*3/uL (ref 0.7–4.0)
MCH: 29.3 pg (ref 26.0–34.0)
MCHC: 35.5 g/dL (ref 30.0–36.0)
MCV: 82.5 fL (ref 78.0–100.0)
Monocytes Absolute: 0.4 10*3/uL (ref 0.1–1.0)
Monocytes Relative: 5 % (ref 3–12)
Neutro Abs: 4.4 10*3/uL (ref 1.7–7.7)
Neutrophils Relative %: 56 % (ref 43–77)
Platelets: 272 10*3/uL (ref 150–400)
RBC: 4.4 MIL/uL (ref 3.87–5.11)
RDW: 12.5 % (ref 11.5–15.5)
WBC: 7.9 10*3/uL (ref 4.0–10.5)

## 2014-06-12 LAB — URINALYSIS, ROUTINE W REFLEX MICROSCOPIC
Bilirubin Urine: NEGATIVE
Glucose, UA: NEGATIVE mg/dL
Hgb urine dipstick: NEGATIVE
Ketones, ur: NEGATIVE mg/dL
Leukocytes, UA: NEGATIVE
Nitrite: NEGATIVE
Protein, ur: NEGATIVE mg/dL
Specific Gravity, Urine: >1.03 — ABNORMAL HIGH (ref 1.005–1.030)
Urobilinogen, UA: 0.2 mg/dL (ref 0.0–1.0)
pH: 5.5 (ref 5.0–8.0)

## 2014-06-12 LAB — PREGNANCY, URINE: Preg Test, Ur: NEGATIVE

## 2014-06-12 MED ORDER — HYDROCODONE-ACETAMINOPHEN 5-325 MG PO TABS
2.0000 | ORAL_TABLET | Freq: Once | ORAL | Status: AC
  Administered 2014-06-12: 2 via ORAL
  Filled 2014-06-12: qty 2

## 2014-06-12 NOTE — ED Provider Notes (Signed)
CSN: [id number]     Arrival date & time 06/12/14  2059 History   First MD Initiated Contact with Patient 06/12/14 2238     Chief Complaint  Patient presents with  . Blurred Vision     (Consider location/radiation/quality/duration/timing/severity/associated sxs/prior Treatment) Patient is a 23 y.o. female presenting with musculoskeletal pain. The history is provided by the patient. No language interpreter was used.  Muscle Pain This is a new problem. The current episode started today. The problem occurs constantly. The problem has been gradually worsening. Pertinent negatives include no weakness. Nothing aggravates the symptoms. She has tried nothing for the symptoms. The treatment provided moderate relief.  Pt has multiple complaints.  Burning with urination.  Pain and cramps in arms and legs, headache and triple vision.  No relief from tylenol  Past Medical History  Diagnosis Date  . Abnormal Pap smear   . UTI (lower urinary tract infection) 01/12/2013  . ADHD (attention deficit hyperactivity disorder)    Past Surgical History  Procedure Laterality Date  . Tooth extraction    . Cesarean section N/A 09/08/2013    Procedure: CESAREAN SECTION;  Surgeon: Tereso NewcomerUgonna A Anyanwu, MD;  Location: [facility];  Service: Obstetrics;  Laterality: N/A;   Family History  Problem Relation Age of Onset  . Diabetes Maternal Grandmother   . Diabetes Paternal Grandmother    History  Substance Use Topics  . Smoking status: Current Every Day Smoker -- 1.00 packs/day    Types: Cigarettes  . Smokeless tobacco: Never Used  . Alcohol Use: No   OB History    Gravida Para Term Preterm AB TAB SAB Ectopic Multiple Living   1 1 1       1      Review of Systems  Neurological: Negative for weakness.  All other systems reviewed and are negative.     Allergies  Review of patient's allergies indicates no known allergies.  Home Medications   Prior to Admission medications   Medication Sig Start Date End Date  Taking? Authorizing Provider  acetaminophen (TYLENOL) 325 MG tablet Take 2 tablets (650 mg total) by mouth every 6 (six) hours as needed. 04/01/14  Yes Lyanne CoKevin M Campos, MD  desogestrel-ethinyl estradiol (APRI,EMOQUETTE,SOLIA) 0.15-30 MG-MCG tablet Take 1 tablet by mouth daily. 03/21/14  Yes Lazaro ArmsLuther H Eure, MD  amoxicillin (AMOXIL) 500 MG capsule Take 1 capsule (500 mg total) by mouth 3 (three) times daily. For 10 days Patient not taking: Reported on 06/12/2014 04/22/14   Tammy L. Triplett, PA-C  naproxen (NAPROSYN) 375 MG tablet Take 1 tablet (375 mg total) by mouth 2 (two) times daily. Patient not taking: Reported on 06/12/2014 04/27/14   Janne NapoleonHope M Neese, NP   BP 121/75 mmHg  Pulse 98  Temp(Src) 98.4 F (36.9 C) (Oral)  Resp 24  Ht 5\' 2"  (1.575 m)  Wt 143 lb 3.2 oz (64.955 kg)  BMI 26.18 kg/m2  SpO2 100%  LMP 05/12/2014 (Approximate)  Breastfeeding? No Physical Exam  Constitutional: She is oriented to person, place, and time. She appears well-developed and well-nourished.  HENT:  Head: Normocephalic and atraumatic.  Right Ear: External ear normal.  Left Ear: External ear normal.  Eyes: Conjunctivae and EOM are normal. Pupils are equal, round, and reactive to light.  Neck: Normal range of motion.  Cardiovascular: Normal rate and normal heart sounds.   Pulmonary/Chest: Effort normal.  Abdominal: Soft. She exhibits no distension.  Musculoskeletal: Normal range of motion.  Neurological: She is alert and oriented to person,  place, and time. She has normal reflexes.  Skin: Skin is warm.  Psychiatric: She has a normal mood and affect.  Nursing note and vitals reviewed.   ED Course  Procedures (including critical care time) Labs Review Labs Reviewed  URINALYSIS, ROUTINE W REFLEX MICROSCOPIC - Abnormal; Notable for the following:    Specific Gravity, Urine >1.030 (*)    All other components within normal limits  PREGNANCY, URINE    Imaging Review No results found.   EKG  Interpretation None      MDM  Labs reviewed  K is 3.6 otherwise negative.  Urine is normal   Pt advised increase fluids,  Pt give 2 hydrocodone with reliefof symptoms.     Final diagnoses:  None    Naprosyn  Hydrocodone     Elson AreasLeslie K Sofia, PA-C 06/13/14 0045

## 2014-06-12 NOTE — ED Notes (Addendum)
Blurred vision and seeing "double with pain shooting in my arms and legs" dysuria,since last night

## 2014-06-13 LAB — COMPREHENSIVE METABOLIC PANEL
ALT: 10 U/L (ref 0–35)
AST: 13 U/L (ref 0–37)
Albumin: 3.7 g/dL (ref 3.5–5.2)
Alkaline Phosphatase: 56 U/L (ref 39–117)
Anion gap: 16 — ABNORMAL HIGH (ref 5–15)
BUN: 11 mg/dL (ref 6–23)
CO2: 20 mEq/L (ref 19–32)
Calcium: 9.3 mg/dL (ref 8.4–10.5)
Chloride: 102 mEq/L (ref 96–112)
Creatinine, Ser: 0.69 mg/dL (ref 0.50–1.10)
GFR calc Af Amer: >90 mL/min (ref >90)
GFR calc non Af Amer: >90 mL/min (ref >90)
Glucose, Bld: 94 mg/dL (ref 70–99)
Potassium: 3.6 mEq/L — ABNORMAL LOW (ref 3.7–5.3)
Sodium: 138 mEq/L (ref 137–147)
Total Bilirubin: 0.2 mg/dL — ABNORMAL LOW (ref 0.3–1.2)
Total Protein: 7.3 g/dL (ref 6.0–8.3)

## 2014-06-13 MED ORDER — HYDROCODONE-ACETAMINOPHEN 5-325 MG PO TABS: 2.0000 | ORAL_TABLET | ORAL | Status: DC | PRN

## 2014-06-13 MED ORDER — NAPROXEN 375 MG PO TABS: 375.0000 mg | ORAL_TABLET | Freq: Two times a day (BID) | ORAL | Status: DC

## 2014-06-13 NOTE — ED Notes (Signed)
Pt called out to desk, PA informed and is to go in and see pt; pt updated on status

## 2014-06-13 NOTE — Discharge Instructions (Signed)

## 2014-07-26 ENCOUNTER — Encounter (HOSPITAL_COMMUNITY): Payer: Self-pay | Admitting: *Deleted

## 2014-07-26 ENCOUNTER — Emergency Department (HOSPITAL_COMMUNITY): Payer: Medicaid Other

## 2014-07-26 ENCOUNTER — Emergency Department (HOSPITAL_COMMUNITY)
Admission: EM | Admit: 2014-07-26 | Discharge: 2014-07-26 | Disposition: A | Discharge status: Home / Self Care | Payer: Medicaid Other | Attending: Emergency Medicine | Admitting: Emergency Medicine

## 2014-07-26 DIAGNOSIS — R000 Tachycardia, unspecified: Secondary | ICD-10-CM | POA: Insufficient documentation

## 2014-07-26 DIAGNOSIS — F419 Anxiety disorder, unspecified: Secondary | ICD-10-CM | POA: Insufficient documentation

## 2014-07-26 DIAGNOSIS — Z79899 Other long term (current) drug therapy: Secondary | ICD-10-CM | POA: Diagnosis not present

## 2014-07-26 DIAGNOSIS — Z87440 Personal history of urinary (tract) infections: Secondary | ICD-10-CM | POA: Insufficient documentation

## 2014-07-26 DIAGNOSIS — R0602 Shortness of breath: Secondary | ICD-10-CM | POA: Insufficient documentation

## 2014-07-26 DIAGNOSIS — Z720 Tobacco use: Secondary | ICD-10-CM | POA: Insufficient documentation

## 2014-07-26 DIAGNOSIS — R0789 Other chest pain: Secondary | ICD-10-CM | POA: Insufficient documentation

## 2014-07-26 DIAGNOSIS — F909 Attention-deficit hyperactivity disorder, unspecified type: Secondary | ICD-10-CM | POA: Diagnosis not present

## 2014-07-26 LAB — CBC WITH DIFFERENTIAL/PLATELET
Basophils Absolute: 0 10*3/uL (ref 0.0–0.1)
Basophils Relative: 0 % (ref 0–1)
Eosinophils Absolute: 0.1 10*3/uL (ref 0.0–0.7)
Eosinophils Relative: 1 % (ref 0–5)
HCT: 40.2 % (ref 36.0–46.0)
Hemoglobin: 13.9 g/dL (ref 12.0–15.0)
Lymphocytes Relative: 37 % (ref 12–46)
Lymphs Abs: 2.6 10*3/uL (ref 0.7–4.0)
MCH: 29 pg (ref 26.0–34.0)
MCHC: 34.6 g/dL (ref 30.0–36.0)
MCV: 83.8 fL (ref 78.0–100.0)
Monocytes Absolute: 0.4 10*3/uL (ref 0.1–1.0)
Monocytes Relative: 6 % (ref 3–12)
Neutro Abs: 4 10*3/uL (ref 1.7–7.7)
Neutrophils Relative %: 56 % (ref 43–77)
Platelets: 280 10*3/uL (ref 150–400)
RBC: 4.8 MIL/uL (ref 3.87–5.11)
RDW: 12.6 % (ref 11.5–15.5)
WBC: 7.2 10*3/uL (ref 4.0–10.5)

## 2014-07-26 LAB — BASIC METABOLIC PANEL
Anion gap: 14 (ref 5–15)
BUN: 11 mg/dL (ref 6–23)
CO2: 22 mEq/L (ref 19–32)
Calcium: 9.6 mg/dL (ref 8.4–10.5)
Chloride: 104 mEq/L (ref 96–112)
Creatinine, Ser: 0.84 mg/dL (ref 0.50–1.10)
GFR calc Af Amer: >90 mL/min (ref >90)
GFR calc non Af Amer: >90 mL/min (ref >90)
Glucose, Bld: 87 mg/dL (ref 70–99)
Potassium: 4.1 mEq/L (ref 3.7–5.3)
Sodium: 140 mEq/L (ref 137–147)

## 2014-07-26 LAB — D-DIMER, QUANTITATIVE: D-Dimer, Quant: <0.27 ug/mL-FEU (ref 0.00–0.48)

## 2014-07-26 LAB — HCG, QUANTITATIVE, PREGNANCY: hCG, Beta Chain, Quant, S: <1 m[IU]/mL (ref <5)

## 2014-07-26 MED ORDER — TRAMADOL HCL 50 MG PO TABS
50.0000 mg | ORAL_TABLET | Freq: Once | ORAL | Status: DC
  Filled 2014-07-26: qty 1

## 2014-07-26 MED ORDER — KETOROLAC TROMETHAMINE 30 MG/ML IJ SOLN
30.0000 mg | Freq: Once | INTRAMUSCULAR | Status: AC
  Administered 2014-07-26: 30 mg via INTRAMUSCULAR
  Filled 2014-07-26: qty 1

## 2014-07-26 MED ORDER — LORAZEPAM 1 MG PO TABS
1.0000 mg | ORAL_TABLET | Freq: Once | ORAL | Status: AC
  Administered 2014-07-26: 1 mg via ORAL
  Filled 2014-07-26: qty 1

## 2014-07-26 MED ORDER — TRAMADOL HCL 50 MG PO TABS: 50.0000 mg | ORAL_TABLET | Freq: Four times a day (QID) | ORAL | Status: DC | PRN

## 2014-07-26 NOTE — ED Notes (Signed)
SOB began last night with constant discomfort to the chest. Nothing makes the symptoms better or worse. Hx of panic attacks but pt states this doesn't feel like one. Pt also states she has been shaky.

## 2014-07-26 NOTE — ED Notes (Signed)
edp made aware of pt's continued CP

## 2014-07-26 NOTE — ED Provider Notes (Signed)
CSN: [id number]     Arrival date & time 07/26/14  1607 History   First MD Initiated Contact with Patient 07/26/14 1637     Chief Complaint  Patient presents with  . Shortness of Breath     (Consider location/radiation/quality/duration/timing/severity/associated sxs/prior Treatment) HPI Patient presents with concern of chest pain, dyspnea. Symptoms were present 1 day ago.  Since onset symptoms of been persistent.  There is anterior sharp, stabbing chest pain. Symptoms are worse with inspiration. No exertional changes. No lightheadedness, syncope, nausea, vomiting, other focal changes from baseline. Patient smokes, is on birth control.  Past Medical History  Diagnosis Date  . Abnormal Pap smear   . UTI (lower urinary tract infection) 01/12/2013  . ADHD (attention deficit hyperactivity disorder)    Past Surgical History  Procedure Laterality Date  . Tooth extraction    . Cesarean section N/A 09/08/2013    Procedure: CESAREAN SECTION;  Surgeon: Tereso NewcomerUgonna A Anyanwu, MD;  Location: [facility];  Service: Obstetrics;  Laterality: N/A;   Family History  Problem Relation Age of Onset  . Diabetes Maternal Grandmother   . Diabetes Paternal Grandmother    History  Substance Use Topics  . Smoking status: Current Every Day Smoker -- 1.00 packs/day    Types: Cigarettes  . Smokeless tobacco: Never Used  . Alcohol Use: No   OB History    Gravida Para Term Preterm AB TAB SAB Ectopic Multiple Living   1 1 1       1      Review of Systems  Constitutional:       Per HPI, otherwise negative  HENT:       Per HPI, otherwise negative  Respiratory:       Per HPI, otherwise negative  Cardiovascular:       Per HPI, otherwise negative  Gastrointestinal: Negative for vomiting.  Endocrine:       Negative aside from HPI  Genitourinary:       Neg aside from HPI   Musculoskeletal:       Per HPI, otherwise negative  Skin: Negative.   Neurological: Negative for syncope.      Allergies  Review  of patient's allergies indicates no known allergies.  Home Medications   Prior to Admission medications   Medication Sig Start Date End Date Taking? Authorizing Provider  desogestrel-ethinyl estradiol (APRI,EMOQUETTE,SOLIA) 0.15-30 MG-MCG tablet Take 1 tablet by mouth daily. 03/21/14  Yes Lazaro ArmsLuther H Eure, MD  acetaminophen (TYLENOL) 325 MG tablet Take 2 tablets (650 mg total) by mouth every 6 (six) hours as needed. Patient taking differently: Take 650 mg by mouth every 6 (six) hours as needed (pain).  04/01/14   Lyanne CoKevin M Campos, MD  amoxicillin (AMOXIL) 500 MG capsule Take 1 capsule (500 mg total) by mouth 3 (three) times daily. For 10 days Patient not taking: Reported on 06/12/2014 04/22/14   Tammy L. Triplett, PA-C  HYDROcodone-acetaminophen (NORCO/VICODIN) 5-325 MG per tablet Take 2 tablets by mouth every 4 (four) hours as needed. Patient not taking: Reported on 07/26/2014 06/13/14   Elson AreasLeslie K Sofia, PA-C  naproxen (NAPROSYN) 375 MG tablet Take 1 tablet (375 mg total) by mouth 2 (two) times daily. Patient not taking: Reported on 07/26/2014 06/13/14   Elson AreasLeslie K Sofia, PA-C   BP 137/71 mmHg  Pulse 121  Temp(Src) 97.8 F (36.6 C) (Oral)  Resp 22  Ht 5\' 2"  (1.575 m)  Wt 140 lb (63.504 kg)  BMI 25.60 kg/m2  SpO2 99%  LMP  Physical  Exam  Constitutional: She is oriented to person, place, and time. She appears well-developed and well-nourished. No distress.  HENT:  Head: Normocephalic and atraumatic.  Eyes: Conjunctivae and EOM are normal.  Cardiovascular: Regular rhythm.  Tachycardia present.   Pulmonary/Chest: Effort normal and breath sounds normal. No stridor. Tachypnea noted. No respiratory distress.  Abdominal: She exhibits no distension.  Musculoskeletal: She exhibits no edema.  Neurological: She is alert and oriented to person, place, and time. No cranial nerve deficit.  Skin: Skin is warm and dry.  Psychiatric: Her mood appears anxious.  Nursing note and vitals reviewed.   ED Course   Procedures (including critical care time) Labs Review Labs Reviewed  D-DIMER, QUANTITATIVE  CBC WITH DIFFERENTIAL  BASIC METABOLIC PANEL  HCG, QUANTITATIVE, PREGNANCY   A review of the chart demonstrates six ED visits in six months  Imaging Review Dg Chest 2 View  07/26/2014   CLINICAL DATA:  Short of breath  EXAM: CHEST  2 VIEW  COMPARISON:  03/31/2014  FINDINGS: The heart size and mediastinal contours are within normal limits. Both lungs are clear. The visualized skeletal structures are unremarkable.  IMPRESSION: No active cardiopulmonary disease.   Electronically Signed   By: Marlan Palauharles  Clark M.D.   On: 07/26/2014 17:04   O2- 99%ra, nml  7:03 PM Patient sitting upright, speaking clearly, still w mild discomfort. MDM   Final diagnoses:  SOB (shortness of breath)    This young patient presents with chest pain, dyspnea. Patient is awake and alert, neurologically intact, hemodynamically stable. Patient is initially mildly tachycardic, but this resolves. Pain improves, but does not resolve entirely. However, there is no evidence for pulmonary embolism nor pneumonia (or other acute CV conditions). Given the tenderness to palpation with pressure on the chest wall, there is some suspicion for inflammatory condition. Patient will start a course of anti-inflammatories, and will follow-up with primary care physician, which she states that she is capable of doing.     Gerhard Munchobert Clarinda Obi, MD 07/26/14 [phone]

## 2014-07-26 NOTE — ED Notes (Signed)
Pt upset that EDP did not prescribe pt anything different for pain, pt had stated that she takes tramadol at home and that they do not work, pt refused dose here, EDP made aware, pt got prescription for tramadol and pt ripped from her d/c papers and tore prescription up and trashed in trash can

## 2014-07-26 NOTE — Discharge Instructions (Signed)
As discussed, your evaluation today has been largely reassuring.  But, it is important that you monitor your condition carefully, and do not hesitate to return to the ED if you develop new, or concerning changes in your condition. ° °Otherwise, please follow-up with your physician for appropriate ongoing care. ° °Chest Pain (Nonspecific) °It is often hard to give a specific diagnosis for the cause of chest pain. There is always a chance that your pain could be related to something serious, such as a heart attack or a blood clot in the lungs. You need to follow up with your health care provider for further evaluation. °CAUSES  °· Heartburn. °· Pneumonia or bronchitis. °· Anxiety or stress. °· Inflammation around your heart (pericarditis) or lung (pleuritis or pleurisy). °· A blood clot in the lung. °· A collapsed lung (pneumothorax). It can develop suddenly on its own (spontaneous pneumothorax) or from trauma to the chest. °· Shingles infection (herpes zoster virus). °The chest wall is composed of bones, muscles, and cartilage. Any of these can be the source of the pain. °· The bones can be bruised by injury. °· The muscles or cartilage can be strained by coughing or overwork. °· The cartilage can be affected by inflammation and become sore (costochondritis). °DIAGNOSIS  °Lab tests or other studies may be needed to find the cause of your pain. Your health care provider may have you take a test called an ambulatory electrocardiogram (ECG). An ECG records your heartbeat patterns over a 24-hour period. You may also have other tests, such as: °· Transthoracic echocardiogram (TTE). During echocardiography, sound waves are used to evaluate how blood flows through your heart. °· Transesophageal echocardiogram (TEE). °· Cardiac monitoring. This allows your health care provider to monitor your heart rate and rhythm in real time. °· Holter monitor. This is a portable device that records your heartbeat and can help diagnose  heart arrhythmias. It allows your health care provider to track your heart activity for several days, if needed. °· Stress tests by exercise or by giving medicine that makes the heart beat faster. °TREATMENT  °· Treatment depends on what may be causing your chest pain. Treatment may include: °¨ Acid blockers for heartburn. °¨ Anti-inflammatory medicine. °¨ Pain medicine for inflammatory conditions. °¨ Antibiotics if an infection is present. °· You may be advised to change lifestyle habits. This includes stopping smoking and avoiding alcohol, caffeine, and chocolate. °· You may be advised to keep your head raised (elevated) when sleeping. This reduces the chance of acid going backward from your stomach into your esophagus. °Most of the time, nonspecific chest pain will improve within 2-3 days with rest and mild pain medicine.  °HOME CARE INSTRUCTIONS  °· If antibiotics were prescribed, take them as directed. Finish them even if you start to feel better. °· For the next few days, avoid physical activities that bring on chest pain. Continue physical activities as directed. °· Do not use any tobacco products, including cigarettes, chewing tobacco, or electronic cigarettes. °· Avoid drinking alcohol. °· Only take medicine as directed by your health care provider. °· Follow your health care provider's suggestions for further testing if your chest pain does not go away. °· Keep any follow-up appointments you made. If you do not go to an appointment, you could develop lasting (chronic) problems with pain. If there is any problem keeping an appointment, call to reschedule. °SEEK MEDICAL CARE IF:  °· Your chest pain does not go away, even after treatment. °· You have   a rash with blisters on your chest. °· You have a fever. °SEEK IMMEDIATE MEDICAL CARE IF:  °· You have increased chest pain or pain that spreads to your arm, neck, jaw, back, or abdomen. °· You have shortness of breath. °· You have an increasing cough, or you  cough up blood. °· You have severe back or abdominal pain. °· You feel nauseous or vomit. °· You have severe weakness. °· You faint. °· You have chills. °This is an emergency. Do not wait to see if the pain will go away. Get medical help at once. Call your local emergency services (911 in [place] Do not drive yourself to the hospital. °MAKE SURE YOU:  °· Understand these instructions. °· Will watch your condition. °· Will get help right away if you are not doing well or get worse. °Document Released: 05/07/2005 Document Revised: 08/02/2013 Document Reviewed: 03/02/2008 °ExitCare® Patient Information ©2015 [employer]. This information is not intended to replace advice given to you by your health care provider. Make sure you discuss any questions you have with your health care provider. ° °

## 2014-07-27 LAB — I-STAT BETA HCG BLOOD, ED (MC, WL, AP ONLY): I-stat hCG, quantitative: <5 m[IU]/mL (ref <5)

## 2014-08-01 ENCOUNTER — Emergency Department (HOSPITAL_COMMUNITY)
Admission: EM | Admit: 2014-08-01 | Discharge: 2014-08-01 | Disposition: A | Discharge status: Home / Self Care | Payer: Medicaid Other | Attending: Emergency Medicine | Admitting: Emergency Medicine

## 2014-08-01 ENCOUNTER — Encounter (HOSPITAL_COMMUNITY): Payer: Self-pay | Admitting: *Deleted

## 2014-08-01 ENCOUNTER — Emergency Department (HOSPITAL_COMMUNITY): Payer: Medicaid Other

## 2014-08-01 DIAGNOSIS — Z720 Tobacco use: Secondary | ICD-10-CM | POA: Diagnosis not present

## 2014-08-01 DIAGNOSIS — Z3202 Encounter for pregnancy test, result negative: Secondary | ICD-10-CM | POA: Insufficient documentation

## 2014-08-01 DIAGNOSIS — M545 Low back pain, unspecified: Secondary | ICD-10-CM

## 2014-08-01 DIAGNOSIS — M546 Pain in thoracic spine: Secondary | ICD-10-CM

## 2014-08-01 DIAGNOSIS — Y9389 Activity, other specified: Secondary | ICD-10-CM | POA: Insufficient documentation

## 2014-08-01 DIAGNOSIS — Y92410 Unspecified street and highway as the place of occurrence of the external cause: Secondary | ICD-10-CM | POA: Insufficient documentation

## 2014-08-01 DIAGNOSIS — Z791 Long term (current) use of non-steroidal anti-inflammatories (NSAID): Secondary | ICD-10-CM | POA: Diagnosis not present

## 2014-08-01 DIAGNOSIS — Z792 Long term (current) use of antibiotics: Secondary | ICD-10-CM | POA: Insufficient documentation

## 2014-08-01 DIAGNOSIS — Y998 Other external cause status: Secondary | ICD-10-CM | POA: Diagnosis not present

## 2014-08-01 DIAGNOSIS — Z87440 Personal history of urinary (tract) infections: Secondary | ICD-10-CM | POA: Insufficient documentation

## 2014-08-01 DIAGNOSIS — Z8659 Personal history of other mental and behavioral disorders: Secondary | ICD-10-CM | POA: Insufficient documentation

## 2014-08-01 DIAGNOSIS — R52 Pain, unspecified: Secondary | ICD-10-CM

## 2014-08-01 DIAGNOSIS — S3992XA Unspecified injury of lower back, initial encounter: Secondary | ICD-10-CM | POA: Insufficient documentation

## 2014-08-01 LAB — PREGNANCY, URINE: Preg Test, Ur: NEGATIVE

## 2014-08-01 MED ORDER — CYCLOBENZAPRINE HCL 10 MG PO TABS: 10.0000 mg | ORAL_TABLET | Freq: Three times a day (TID) | ORAL | Status: DC | PRN

## 2014-08-01 MED ORDER — KETOROLAC TROMETHAMINE 60 MG/2ML IM SOLN
60.0000 mg | Freq: Once | INTRAMUSCULAR | Status: AC
  Administered 2014-08-01: 60 mg via INTRAMUSCULAR
  Filled 2014-08-01: qty 2

## 2014-08-01 MED ORDER — NAPROXEN 500 MG PO TABS: 500.0000 mg | ORAL_TABLET | Freq: Two times a day (BID) | ORAL | Status: DC

## 2014-08-01 MED ORDER — CYCLOBENZAPRINE HCL 10 MG PO TABS
10.0000 mg | ORAL_TABLET | Freq: Once | ORAL | Status: AC
  Administered 2014-08-01: 10 mg via ORAL
  Filled 2014-08-01: qty 1

## 2014-08-01 MED ORDER — ACETAMINOPHEN 500 MG PO TABS: 1000.0000 mg | ORAL_TABLET | Freq: Once | ORAL | Status: DC

## 2014-08-01 NOTE — ED Notes (Addendum)
Pt taken off spine board while maintaining C-spine precautions. Upon assessment pt c/o lower back pain and tenderness with palpation. No obvious signs of injury to back.
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
## 2014-08-01 NOTE — ED Notes (Signed)
Pt left ED, ambulatory with no signs of distress. Pt verbalized discharge instructions. 
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
## 2014-08-01 NOTE — Discharge Instructions (Signed)
Try using ice and heat to your painful back.  Take the medications for pain and muscle spasms. Return to the ED for any problems listed on the head injury sheet. Recheck if you aren't improving in the next week.

## 2014-08-11 ENCOUNTER — Emergency Department (HOSPITAL_COMMUNITY)
Admission: EM | Admit: 2014-08-11 | Discharge: 2014-08-11 | Disposition: A | Discharge status: Home / Self Care | Payer: Medicaid Other | Attending: Emergency Medicine | Admitting: Emergency Medicine

## 2014-08-11 ENCOUNTER — Encounter (HOSPITAL_COMMUNITY): Payer: Self-pay | Admitting: *Deleted

## 2014-08-11 DIAGNOSIS — Z792 Long term (current) use of antibiotics: Secondary | ICD-10-CM | POA: Diagnosis not present

## 2014-08-11 DIAGNOSIS — Z791 Long term (current) use of non-steroidal anti-inflammatories (NSAID): Secondary | ICD-10-CM | POA: Diagnosis not present

## 2014-08-11 DIAGNOSIS — M545 Low back pain, unspecified: Secondary | ICD-10-CM

## 2014-08-11 DIAGNOSIS — Z79899 Other long term (current) drug therapy: Secondary | ICD-10-CM | POA: Insufficient documentation

## 2014-08-11 DIAGNOSIS — Z720 Tobacco use: Secondary | ICD-10-CM | POA: Diagnosis not present

## 2014-08-11 DIAGNOSIS — M549 Dorsalgia, unspecified: Secondary | ICD-10-CM | POA: Diagnosis present

## 2014-08-11 DIAGNOSIS — M79604 Pain in right leg: Secondary | ICD-10-CM

## 2014-08-11 DIAGNOSIS — Z8659 Personal history of other mental and behavioral disorders: Secondary | ICD-10-CM | POA: Diagnosis not present

## 2014-08-11 DIAGNOSIS — Z87440 Personal history of urinary (tract) infections: Secondary | ICD-10-CM | POA: Insufficient documentation

## 2014-08-11 MED ORDER — IBUPROFEN 800 MG PO TABS: 800.0000 mg | ORAL_TABLET | Freq: Three times a day (TID) | ORAL | Status: DC

## 2014-08-11 MED ORDER — TRAMADOL HCL 50 MG PO TABS: 50.0000 mg | ORAL_TABLET | Freq: Four times a day (QID) | ORAL | Status: DC | PRN

## 2014-08-11 MED ORDER — TRAMADOL HCL 50 MG PO TABS
50.0000 mg | ORAL_TABLET | Freq: Once | ORAL | Status: AC
  Administered 2014-08-11: 50 mg via ORAL
  Filled 2014-08-11: qty 1

## 2014-08-11 MED ORDER — METHOCARBAMOL 500 MG PO TABS
500.0000 mg | ORAL_TABLET | Freq: Once | ORAL | Status: AC
  Administered 2014-08-11: 500 mg via ORAL
  Filled 2014-08-11: qty 1

## 2014-08-11 MED ORDER — IBUPROFEN 800 MG PO TABS
800.0000 mg | ORAL_TABLET | Freq: Once | ORAL | Status: DC
  Filled 2014-08-11: qty 1

## 2014-08-11 MED ORDER — METHOCARBAMOL 500 MG PO TABS: 500.0000 mg | ORAL_TABLET | Freq: Three times a day (TID) | ORAL | Status: DC
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
## 2014-08-11 NOTE — Discharge Instructions (Signed)
Back Pain, Adult °Back pain is very common. The pain often gets better over time. The cause of back pain is usually not dangerous. Most people can learn to manage their back pain on their own.  °HOME CARE  °· Stay active. Start with short walks on flat ground if you can. Try to walk farther each day. °· Do not sit, drive, or stand in one place for more than 30 minutes. Do not stay in bed. °· Do not avoid exercise or work. Activity can help your back heal faster. °· Be careful when you bend or lift an object. Bend at your knees, keep the object close to you, and do not twist. °· Sleep on a firm mattress. Lie on your side, and bend your knees. If you lie on your back, put a pillow under your knees. °· Only take medicines as told by your doctor. °· Put ice on the injured area. °¨ Put ice in a plastic bag. °¨ Place a towel between your skin and the bag. °¨ Leave the ice on for 15-20 minutes, 03-04 times a day for the first 2 to 3 days. After that, you can switch between ice and heat packs. °· Ask your doctor about back exercises or massage. °· Avoid feeling anxious or stressed. Find good ways to deal with stress, such as exercise. °GET HELP RIGHT AWAY IF:  °· Your pain does not go away with rest or medicine. °· Your pain does not go away in 1 week. °· You have new problems. °· You do not feel well. °· The pain spreads into your legs. °· You cannot control when you poop (bowel movement) or pee (urinate). °· Your arms or legs feel weak or lose feeling (numbness). °· You feel sick to your stomach (nauseous) or throw up (vomit). °· You have belly (abdominal) pain. °· You feel like you may pass out (faint). °MAKE SURE YOU:  °· Understand these instructions. °· Will watch your condition. °· Will get help right away if you are not doing well or get worse. °Document Released: 01/14/2008 Document Revised: 10/20/2011 Document Reviewed: 11/29/2013 °[employer]® Patient Information ©2015 [employer]. This information is not intended  to replace advice given to you by your health care provider. Make sure you discuss any questions you have with your health care provider. ° °
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
## 2014-09-09 ENCOUNTER — Emergency Department (HOSPITAL_COMMUNITY): Payer: Medicaid Other

## 2014-09-09 ENCOUNTER — Encounter (HOSPITAL_COMMUNITY): Payer: Self-pay | Admitting: Emergency Medicine

## 2014-09-09 ENCOUNTER — Emergency Department (HOSPITAL_COMMUNITY)
Admission: EM | Admit: 2014-09-09 | Discharge: 2014-09-09 | Disposition: A | Discharge status: Home / Self Care | Payer: Medicaid Other | Attending: Emergency Medicine | Admitting: Emergency Medicine

## 2014-09-09 DIAGNOSIS — Z720 Tobacco use: Secondary | ICD-10-CM | POA: Insufficient documentation

## 2014-09-09 DIAGNOSIS — R52 Pain, unspecified: Secondary | ICD-10-CM

## 2014-09-09 DIAGNOSIS — Z8659 Personal history of other mental and behavioral disorders: Secondary | ICD-10-CM | POA: Diagnosis not present

## 2014-09-09 DIAGNOSIS — Z79899 Other long term (current) drug therapy: Secondary | ICD-10-CM | POA: Diagnosis not present

## 2014-09-09 DIAGNOSIS — Z793 Long term (current) use of hormonal contraceptives: Secondary | ICD-10-CM | POA: Insufficient documentation

## 2014-09-09 DIAGNOSIS — M545 Low back pain: Secondary | ICD-10-CM | POA: Diagnosis not present

## 2014-09-09 DIAGNOSIS — Z87440 Personal history of urinary (tract) infections: Secondary | ICD-10-CM | POA: Insufficient documentation

## 2014-09-09 DIAGNOSIS — M79651 Pain in right thigh: Secondary | ICD-10-CM | POA: Diagnosis present

## 2014-09-09 DIAGNOSIS — E876 Hypokalemia: Secondary | ICD-10-CM

## 2014-09-09 LAB — CBC WITH DIFFERENTIAL/PLATELET
Basophils Absolute: 0 10*3/uL (ref 0.0–0.1)
Basophils Relative: 0 % (ref 0–1)
Eosinophils Absolute: 0.1 10*3/uL (ref 0.0–0.7)
Eosinophils Relative: 1 % (ref 0–5)
HCT: 40.5 % (ref 36.0–46.0)
Hemoglobin: 14.1 g/dL (ref 12.0–15.0)
Lymphocytes Relative: 36 % (ref 12–46)
Lymphs Abs: 2.1 10*3/uL (ref 0.7–4.0)
MCH: 29 pg (ref 26.0–34.0)
MCHC: 34.8 g/dL (ref 30.0–36.0)
MCV: 83.2 fL (ref 78.0–100.0)
Monocytes Absolute: 0.3 10*3/uL (ref 0.1–1.0)
Monocytes Relative: 5 % (ref 3–12)
Neutro Abs: 3.4 10*3/uL (ref 1.7–7.7)
Neutrophils Relative %: 58 % (ref 43–77)
Platelets: 240 10*3/uL (ref 150–400)
RBC: 4.87 MIL/uL (ref 3.87–5.11)
RDW: 12.9 % (ref 11.5–15.5)
WBC: 5.9 10*3/uL (ref 4.0–10.5)

## 2014-09-09 LAB — BASIC METABOLIC PANEL
Anion gap: 8 (ref 5–15)
BUN: 9 mg/dL (ref 6–23)
CO2: 23 mmol/L (ref 19–32)
Calcium: 9.3 mg/dL (ref 8.4–10.5)
Chloride: 108 mmol/L (ref 96–112)
Creatinine, Ser: 0.74 mg/dL (ref 0.50–1.10)
GFR calc Af Amer: >90 mL/min (ref >90)
GFR calc non Af Amer: >90 mL/min (ref >90)
Glucose, Bld: 91 mg/dL (ref 70–99)
Potassium: 3.2 mmol/L — ABNORMAL LOW (ref 3.5–5.1)
Sodium: 139 mmol/L (ref 135–145)

## 2014-09-09 MED ORDER — HYDROCODONE-ACETAMINOPHEN 5-325 MG PO TABS
1.0000 | ORAL_TABLET | Freq: Once | ORAL | Status: AC
  Administered 2014-09-09: 1 via ORAL
  Filled 2014-09-09: qty 1

## 2014-09-09 MED ORDER — HYDROMORPHONE HCL 1 MG/ML IJ SOLN
1.0000 mg | Freq: Once | INTRAMUSCULAR | Status: AC
  Administered 2014-09-09: 1 mg via INTRAMUSCULAR
  Filled 2014-09-09: qty 1

## 2014-09-09 MED ORDER — POTASSIUM CHLORIDE CRYS ER 20 MEQ PO TBCR: 20.0000 meq | EXTENDED_RELEASE_TABLET | Freq: Every day | ORAL | Status: DC

## 2014-09-09 MED ORDER — POTASSIUM CHLORIDE CRYS ER 20 MEQ PO TBCR
40.0000 meq | EXTENDED_RELEASE_TABLET | Freq: Once | ORAL | Status: AC
  Administered 2014-09-09: 40 meq via ORAL
  Filled 2014-09-09: qty 2

## 2014-09-09 MED ORDER — HYDROCODONE-ACETAMINOPHEN 5-325 MG PO TABS: 1.0000 | ORAL_TABLET | Freq: Four times a day (QID) | ORAL | Status: DC | PRN

## 2014-09-09 NOTE — ED Notes (Signed)
Patient with no complaints at this time. Respirations even and unlabored. Skin warm/dry. Discharge instructions reviewed with patient at this time. Patient given opportunity to voice concerns/ask questions. Patient discharged at this time and left Emergency Department with steady gait.   

## 2014-09-09 NOTE — ED Notes (Signed)
Dr. Estell HarpinZammit notified of return of pain on top of thighs.  Order recv'd

## 2014-09-09 NOTE — Discharge Instructions (Signed)
Follow up if not improving

## 2014-09-09 NOTE — ED Notes (Signed)
C/O bilateral LE "cutting" type pain. Foot flexion against resistance causes lumbar spine pain.  Point tenderness from approximately T11 downward on palpation. CMS intact LE. Hx of lumbar spine problems.  Denies any other body aches or joint pain, fevers, n/v. Reports mild sore throat. No erythema or swelling noted in posterior pharynx.

## 2014-09-09 NOTE — ED Provider Notes (Signed)
CSN: [id number]     Arrival date & time 09/09/14  1150 History   First MD Initiated Contact with Patient 09/09/14 1236     Chief Complaint  Patient presents with  . Leg Pain     (Consider location/radiation/quality/duration/timing/severity/associated sxs/prior Treatment) Patient is a 24 y.o. female presenting with leg pain. The history is provided by the patient (pt complains of bilateral leg cramps).  Leg Pain Lower extremity pain location: pain in both thighs. Injury: no   Pain details:    Quality:  Aching   Radiates to:  Does not radiate   Severity:  Moderate   Onset quality:  Sudden   Timing:  Constant   Progression:  Waxing and waning Associated symptoms: no back pain and no fatigue     Past Medical History  Diagnosis Date  . Abnormal Pap smear   . UTI (lower urinary tract infection) 01/12/2013  . ADHD (attention deficit hyperactivity disorder)    Past Surgical History  Procedure Laterality Date  . Tooth extraction    . Cesarean section N/A 09/08/2013    Procedure: CESAREAN SECTION;  Surgeon: Tereso NewcomerUgonna A Anyanwu, MD;  Location: [facility];  Service: Obstetrics;  Laterality: N/A;   Family History  Problem Relation Age of Onset  . Diabetes Maternal Grandmother   . Diabetes Paternal Grandmother    History  Substance Use Topics  . Smoking status: Current Every Day Smoker -- 1.00 packs/day    Types: Cigarettes  . Smokeless tobacco: Never Used  . Alcohol Use: No   OB History    Gravida Para Term Preterm AB TAB SAB Ectopic Multiple Living   1 1 1       1      Review of Systems  Constitutional: Negative for appetite change and fatigue.  HENT: Negative for congestion, ear discharge and sinus pressure.   Eyes: Negative for discharge.  Respiratory: Negative for cough.   Cardiovascular: Negative for chest pain.  Gastrointestinal: Negative for abdominal pain and diarrhea.  Genitourinary: Negative for frequency and hematuria.  Musculoskeletal: Negative for back pain.  Skin:  Negative for rash.  Neurological: Negative for seizures and headaches.  Psychiatric/Behavioral: Negative for hallucinations.      Allergies  Review of patient's allergies indicates no known allergies.  Home Medications   Prior to Admission medications   Medication Sig Start Date End Date Taking? Authorizing Provider  desogestrel-ethinyl estradiol (APRI,EMOQUETTE,SOLIA) 0.15-30 MG-MCG tablet Take 1 tablet by mouth daily. 03/21/14  Yes Lazaro ArmsLuther H Eure, MD  acetaminophen (TYLENOL) 325 MG tablet Take 2 tablets (650 mg total) by mouth every 6 (six) hours as needed. Patient taking differently: Take 650 mg by mouth every 6 (six) hours as needed (pain).  04/01/14   Lyanne CoKevin M Campos, MD  amoxicillin (AMOXIL) 500 MG capsule Take 1 capsule (500 mg total) by mouth 3 (three) times daily. For 10 days Patient not taking: Reported on 06/12/2014 04/22/14   Tammy L. Triplett, PA-C  cyclobenzaprine (FLEXERIL) 10 MG tablet Take 1 tablet (10 mg total) by mouth 3 (three) times daily as needed for muscle spasms. Patient not taking: Reported on 09/09/2014 08/01/14   Ward GivensIva L Knapp, MD  HYDROcodone-acetaminophen (NORCO/VICODIN) 5-325 MG per tablet Take 1 tablet by mouth every 6 (six) hours as needed. 09/09/14   Benny LennertJoseph L Rickie Gutierres, MD  ibuprofen (ADVIL,MOTRIN) 800 MG tablet Take 1 tablet (800 mg total) by mouth 3 (three) times daily. Patient not taking: Reported on 09/09/2014 08/11/14   Tammy L. Triplett, PA-C  methocarbamol (ROBAXIN)  500 MG tablet Take 1 tablet (500 mg total) by mouth 3 (three) times daily. Patient not taking: Reported on 09/09/2014 08/11/14   Tammy L. Triplett, PA-C  naproxen (NAPROSYN) 375 MG tablet Take 1 tablet (375 mg total) by mouth 2 (two) times daily. Patient not taking: Reported on 07/26/2014 06/13/14   Elson Areas, PA-C  naproxen (NAPROSYN) 500 MG tablet Take 1 tablet (500 mg total) by mouth 2 (two) times daily. Patient not taking: Reported on 09/09/2014 08/01/14   Ward Givens, MD  potassium chloride SA  (K-DUR,KLOR-CON) 20 MEQ tablet Take 1 tablet (20 mEq total) by mouth daily. 09/09/14   Benny Lennert, MD  traMADol (ULTRAM) 50 MG tablet Take 1 tablet (50 mg total) by mouth every 6 (six) hours as needed. Patient not taking: Reported on 09/09/2014 08/11/14   Tammy L. Triplett, PA-C   BP 128/87 mmHg  Pulse 126  Temp(Src) 98.2 F (36.8 C) (Oral)  Resp 18  Ht  (1.575 m)  Wt 134 lb 6.4 oz (60.963 kg)  BMI 24.58 kg/m2  SpO2 100%  LMP 09/09/2014  Breastfeeding? No Physical Exam  Constitutional: She is oriented to person, place, and time. She appears well-developed.  HENT:  Head: Normocephalic.  Eyes: Conjunctivae and EOM are normal. No scleral icterus.  Neck: Neck supple. No thyromegaly present.  Cardiovascular: Normal rate and regular rhythm.  Exam reveals no gallop and no friction rub.   No murmur heard. Pulmonary/Chest: No stridor. She has no wheezes. She has no rales. She exhibits no tenderness.  Abdominal: She exhibits no distension. There is no tenderness. There is no rebound.  Musculoskeletal: Normal range of motion. She exhibits no edema.  Lymphadenopathy:    She has no cervical adenopathy.  Neurological: She is oriented to person, place, and time. She exhibits normal muscle tone. Coordination normal.  Skin: No rash noted. No erythema.  Psychiatric: She has a normal mood and affect. Her behavior is normal.    ED Course  Procedures (including critical care time) Labs Review Labs Reviewed  BASIC METABOLIC PANEL - Abnormal; Notable for the following:    Potassium 3.2 (*)    All other components within normal limits  CBC WITH DIFFERENTIAL/PLATELET    Imaging Review Dg Lumbar Spine Complete  09/09/2014   CLINICAL DATA:  Lower back pain.  EXAM: LUMBAR SPINE - COMPLETE 4+ VIEW  COMPARISON:  August 01, 2014.  FINDINGS: No definite evidence of acute fracture or spondylolisthesis is noted. 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 Posterior facet joints appear normal. Disc spaces  are well-maintained.  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 No acute abnormality seen in the lumbar spine.   Electronically Signed   By: Roque Lias M.D.   On: 09/09/2014 13:19     EKG Interpretation None      MDM   Final diagnoses:  Pain  Hypokalemia    Leg cramps,   tx with potassium and vicodin and follow up with pcp     Benny Lennert, MD 09/09/14 1529

## 2014-09-09 NOTE — ED Notes (Signed)
Pt reports generalized body aches, sore throat and nausea since this am. Pt denies any known fevers,n/v/d.

## 2014-10-24 ENCOUNTER — Emergency Department (HOSPITAL_COMMUNITY)
Admission: EM | Admit: 2014-10-24 | Discharge: 2014-10-25 | Disposition: A | Discharge status: Home / Self Care | Payer: Medicaid Other | Attending: Emergency Medicine | Admitting: Emergency Medicine

## 2014-10-24 ENCOUNTER — Encounter (HOSPITAL_COMMUNITY): Payer: Self-pay | Admitting: *Deleted

## 2014-10-24 DIAGNOSIS — Z793 Long term (current) use of hormonal contraceptives: Secondary | ICD-10-CM | POA: Insufficient documentation

## 2014-10-24 DIAGNOSIS — Z79899 Other long term (current) drug therapy: Secondary | ICD-10-CM | POA: Diagnosis not present

## 2014-10-24 DIAGNOSIS — Z791 Long term (current) use of non-steroidal anti-inflammatories (NSAID): Secondary | ICD-10-CM | POA: Insufficient documentation

## 2014-10-24 DIAGNOSIS — Z792 Long term (current) use of antibiotics: Secondary | ICD-10-CM | POA: Diagnosis not present

## 2014-10-24 DIAGNOSIS — M79605 Pain in left leg: Secondary | ICD-10-CM | POA: Diagnosis not present

## 2014-10-24 DIAGNOSIS — M79604 Pain in right leg: Secondary | ICD-10-CM | POA: Insufficient documentation

## 2014-10-24 DIAGNOSIS — Z8659 Personal history of other mental and behavioral disorders: Secondary | ICD-10-CM | POA: Diagnosis not present

## 2014-10-24 DIAGNOSIS — Z87440 Personal history of urinary (tract) infections: Secondary | ICD-10-CM | POA: Insufficient documentation

## 2014-10-24 DIAGNOSIS — Z720 Tobacco use: Secondary | ICD-10-CM | POA: Diagnosis not present

## 2014-10-24 LAB — I-STAT CHEM 8, ED
BUN: 9 mg/dL (ref 6–23)
Calcium, Ion: 1.16 mmol/L (ref 1.12–1.23)
Chloride: 109 mmol/L (ref 96–112)
Creatinine, Ser: 0.6 mg/dL (ref 0.50–1.10)
Glucose, Bld: 90 mg/dL (ref 70–99)
HCT: 38 % (ref 36.0–46.0)
Hemoglobin: 12.9 g/dL (ref 12.0–15.0)
Potassium: 3.7 mmol/L (ref 3.5–5.1)
Sodium: 138 mmol/L (ref 135–145)
TCO2: 15 mmol/L (ref 0–100)

## 2014-10-24 LAB — URINALYSIS, ROUTINE W REFLEX MICROSCOPIC
Bilirubin Urine: NEGATIVE
Glucose, UA: NEGATIVE mg/dL
Hgb urine dipstick: NEGATIVE
Ketones, ur: NEGATIVE mg/dL
Leukocytes, UA: NEGATIVE
Nitrite: NEGATIVE
Protein, ur: NEGATIVE mg/dL
Specific Gravity, Urine: 1.025 (ref 1.005–1.030)
Urobilinogen, UA: 0.2 mg/dL (ref 0.0–1.0)
pH: 6 (ref 5.0–8.0)

## 2014-10-24 MED ORDER — KETOROLAC TROMETHAMINE 60 MG/2ML IM SOLN
60.0000 mg | Freq: Once | INTRAMUSCULAR | Status: AC
  Administered 2014-10-24: 60 mg via INTRAMUSCULAR
  Filled 2014-10-24: qty 2

## 2014-10-24 NOTE — ED Notes (Addendum)
Pt to department via EMS.  Pt states "I can't feel my legs and it feels like there are razor blades in my legs".  Per EMS, pt was ambulatory with minimal assist on scene.   In triage, pt stood from wheelchair and onto scale with no assistance.

## 2014-10-25 MED ORDER — NAPROXEN 500 MG PO TABS: 500.0000 mg | ORAL_TABLET | Freq: Two times a day (BID) | ORAL | Status: DC

## 2014-10-25 NOTE — ED Notes (Signed)
Pt called nurse to room and stated she "could not walk" and needed more pain medication. Stating she could not even get "her pants on". Notified EDP and discussed pain management.

## 2014-10-25 NOTE — Discharge Instructions (Signed)
Your labs and exam tonight are normal.  Followup with your doctor if your pain persists.  You may want to try a heating pad - 20 minutes several times daily to your lower back to see if your symptoms improve.  You may have an irritated nerve in your lower back causing pain to radiate into your legs.

## 2014-10-25 NOTE — ED Provider Notes (Signed)
CSN: [id number]     Arrival date & time 10/24/14  1951 History   First MD Initiated Contact with Patient 10/24/14 2236     Chief Complaint  Patient presents with  . Leg Pain     (Consider location/radiation/quality/duration/timing/severity/associated sxs/prior Treatment) The history is provided by the patient.   Jessica [name] is a 24 y.o. female presenting complaint of numbness of her bilateral anterior thighs in association with sharp pain (describes "razor blades in my legs") in the same location starting at 6 pm while lying on the couch tonight.  She denies any injury or trauma, but does report prior episodes of similar symptoms, but not as severe. Denies low back pain, fevers, chills, rash, myalgias, muscle spasm.   She states she cannot stand due to degree of pain.  She has had no treatments prior to arrival.    She arrived by EMS who reported she was ambulatory at the scene.     Past Medical History  Diagnosis Date  . Abnormal Pap smear   . UTI (lower urinary tract infection) 01/12/2013  . ADHD (attention deficit hyperactivity disorder)    Past Surgical History  Procedure Laterality Date  . Tooth extraction    . Cesarean section N/A 09/08/2013    Procedure: CESAREAN SECTION;  Surgeon: Tereso Newcomer, MD;  Location: [facility];  Service: Obstetrics;  Laterality: N/A;   Family History  Problem Relation Age of Onset  . Diabetes Maternal Grandmother   . Diabetes Paternal Grandmother    History  Substance Use Topics  . Smoking status: Current Every Day Smoker -- 1.00 packs/day    Types: Cigarettes  . Smokeless tobacco: Never Used  . Alcohol Use: No   OB History    Gravida Para Term Preterm AB TAB SAB Ectopic Multiple Living   Review of Systems  Constitutional: Negative for fever and chills.  HENT: Negative for congestion and sore throat.   Eyes: Negative.   Respiratory: Negative for chest tightness and shortness of breath.   Cardiovascular: Negative  for chest pain.  Gastrointestinal: Negative for nausea and abdominal pain.  Genitourinary: Negative.   Musculoskeletal: Positive for arthralgias. Negative for myalgias, joint swelling and neck pain.  Skin: Negative.  Negative for rash and wound.  Neurological: Negative for dizziness, weakness, light-headedness, numbness and headaches.  Psychiatric/Behavioral: Negative.       Allergies  Review of patient's allergies indicates no known allergies.  Home Medications   Prior to Admission medications   Medication Sig Start Date End Date Taking? Authorizing Provider  acetaminophen (TYLENOL) 325 MG tablet Take 2 tablets (650 mg total) by mouth every 6 (six) hours as needed. Patient taking differently: Take 650 mg by mouth every 6 (six) hours as needed (pain).  04/01/14  Yes Azalia Bilis, MD  desogestrel-ethinyl estradiol (APRI,EMOQUETTE,SOLIA) 0.15-30 MG-MCG tablet Take 1 tablet by mouth daily. 03/21/14  Yes [name], MD  amoxicillin (AMOXIL) 500 MG capsule Take 1 capsule (500 mg total) by mouth 3 (three) times daily. For 10 days Patient not taking: Reported on 06/12/2014 04/22/14   Tammi Triplett, PA-C  cyclobenzaprine (FLEXERIL) 10 MG tablet Take 1 tablet (10 mg total) by mouth 3 (three) times daily as needed for muscle spasms. Patient not taking: Reported on 09/09/2014 08/01/14   [name], MD  HYDROcodone-acetaminophen (NORCO/VICODIN) 5-325 MG per tablet Take 1 tablet by mouth every 6 (six) hours as needed. Patient not  taking: Reported on 10/24/2014 09/09/14   Bethann BerkshireJoseph Zammit, MD  ibuprofen (ADVIL,MOTRIN) 800 MG tablet Take 1 tablet (800 mg total) by mouth 3 (three) times daily. Patient not taking: Reported on 09/09/2014 08/11/14   Tammi Triplett, PA-C  methocarbamol (ROBAXIN) 500 MG tablet Take 1 tablet (500 mg total) by mouth 3 (three) times daily. Patient not taking: Reported on 09/09/2014 08/11/14   Tammi Triplett, PA-C  naproxen (NAPROSYN) 500 MG tablet Take 1 tablet (500 mg total) by mouth 2  (two) times daily. 10/25/14   Burgess AmorJulie Sedona Wenk, PA-C  potassium chloride SA (K-DUR,KLOR-CON) 20 MEQ tablet Take 1 tablet (20 mEq total) by mouth daily. Patient not taking: Reported on 10/24/2014 09/09/14   Bethann BerkshireJoseph Zammit, MD  traMADol (ULTRAM) 50 MG tablet Take 1 tablet (50 mg total) by mouth every 6 (six) hours as needed. Patient not taking: Reported on 09/09/2014 08/11/14   Tammi Triplett, PA-C   BP 122/69 mmHg  Pulse 79  Temp(Src) 98.9 F (37.2 C) (Oral)  Resp 16  Ht 5\' 2"  (1.575 m)  Wt 138 lb 1.6 oz (62.642 kg)  BMI 25.25 kg/m2  SpO2 99%  LMP 10/09/2014 Physical Exam  Constitutional: She appears well-developed and well-nourished.  HENT:  Head: Atraumatic.  Neck: Normal range of motion.  Cardiovascular:  Pulses equal bilaterally  Musculoskeletal: She exhibits tenderness.       Lumbar back: She exhibits no bony tenderness and no edema.  ttp anterior generalized upper thighs, no point tenderness.  Skin is soft,  No palpable muscle spasm  No rashes, lesions, bruising.  Dorsalis pedal pulses intact.  Calves soft.   Neurological: She is alert. She has normal strength. She displays normal reflexes. No sensory deficit.  Skin: Skin is warm and dry.  Psychiatric: She has a normal mood and affect.    ED Course  Procedures (including critical care time) Labs Review Labs Reviewed  URINALYSIS, ROUTINE W REFLEX MICROSCOPIC  I-STAT CHEM 8, ED    Imaging Review No results found.   EKG Interpretation None      MDM   Final diagnoses:  Leg pain, bilateral    Ordered toradol injection which she accepted, but stated it would not work when administered. Advised RN she was now unable to walk and needed stronger pain medicine.  When this was denied, she stood up, dressed and stated was ready to go , as her ride was on their way.    Suspect this patient is malingering. [place] narcotic database reviewed - she has received 4 narcotic prescriptions in the past 4 months. She was prescribed naproxen,  advised heat tx if pain persists. F/u with pcp prn.  The patient appears reasonably screened and/or stabilized for discharge and I doubt any other medical condition or other [facility] requiring further screening, evaluation, or treatment in the ED at this time prior to discharge.     Burgess AmorJulie Evan Mackie, PA-C 10/25/14 0103  Azalia BilisKevin Campos, MD 10/25/14 [phone]

## 2014-10-25 NOTE — ED Notes (Signed)
After notifying pt that EDP had not ordered any further pain medication and would be preparing discharge papers. Pt asked for coke to drink. Nurse returned to room with beverage, pt was standing at bed side with her pants and shoes on, she then sat down on bed and stated her ride was here whenever she was discharged.

## 2014-12-26 ENCOUNTER — Other Ambulatory Visit (HOSPITAL_COMMUNITY)
Admission: RE | Admit: 2014-12-26 | Discharge: 2014-12-26 | Disposition: A | Discharge status: Home / Self Care | Payer: Medicaid Other | Source: Ambulatory Visit | Attending: Obstetrics & Gynecology | Admitting: Obstetrics & Gynecology

## 2014-12-26 ENCOUNTER — Ambulatory Visit (INDEPENDENT_AMBULATORY_CARE_PROVIDER_SITE_OTHER): Payer: Medicaid Other | Admitting: Obstetrics & Gynecology

## 2014-12-26 ENCOUNTER — Encounter: Payer: Self-pay | Admitting: Obstetrics & Gynecology

## 2014-12-26 VITALS — BP 120/90 | HR 76 | Ht 62.0 in | Wt 138.4 lb

## 2014-12-26 DIAGNOSIS — Z01419 Encounter for gynecological examination (general) (routine) without abnormal findings: Secondary | ICD-10-CM

## 2014-12-26 DIAGNOSIS — Z0000 Encounter for general adult medical examination without abnormal findings: Secondary | ICD-10-CM | POA: Diagnosis not present

## 2014-12-26 DIAGNOSIS — Z124 Encounter for screening for malignant neoplasm of cervix: Secondary | ICD-10-CM

## 2014-12-26 DIAGNOSIS — Z3202 Encounter for pregnancy test, result negative: Secondary | ICD-10-CM

## 2014-12-26 LAB — POCT URINE PREGNANCY: Preg Test, Ur: NEGATIVE

## 2014-12-26 MED ORDER — PIROXICAM 20 MG PO CAPS: 20.0000 mg | ORAL_CAPSULE | Freq: Every day | ORAL | Status: DC

## 2014-12-26 MED ORDER — DESOGESTREL-ETHINYL ESTRADIOL 0.15-30 MG-MCG PO TABS: 1.0000 | ORAL_TABLET | Freq: Every day | ORAL | Status: DC

## 2014-12-26 NOTE — Progress Notes (Signed)
Patient ID: Jessica [name], female   DOB: 08/30/1990, 24 y.o.   MRN: [id number] Subjective:     Jessica QuailRachel N Manzo is a 24 y.o. female here for a routine exam.  Patient's last menstrual period was 12/22/2014. G1P1001 Birth Control Method:  OCP Menstrual Calendar(currently): regular  Current complaints: cramping.   Current acute medical issues:  none   Recent Gynecologic History Patient's last menstrual period was 12/22/2014. Last Pap: never,   Last mammogram: ,    Past Medical History  Diagnosis Date  . Abnormal Pap smear   . UTI (lower urinary tract infection) 01/12/2013  . ADHD (attention deficit hyperactivity disorder)     Past Surgical History  Procedure Laterality Date  . Tooth extraction    . Cesarean section N/A 09/08/2013    Procedure: CESAREAN SECTION;  Surgeon: Tereso NewcomerUgonna A Anyanwu, MD;  Location: [facility];  Service: Obstetrics;  Laterality: N/A;    OB History    Gravida Para Term Preterm AB TAB SAB Ectopic Multiple Living   1 1 1       1       History   Social History  . Marital Status: Single    Spouse Name: N/A  . Number of Children: N/A  . Years of Education: N/A   Social History Main Topics  . Smoking status: Current Every Day Smoker -- 1.00 packs/day    Types: Cigarettes  . Smokeless tobacco: Never Used  . Alcohol Use: No  . Drug Use: No  . Sexual Activity: Not Currently    Birth Control/ Protection: Pill   Other Topics Concern  . None   Social History Narrative    Family History  Problem Relation Age of Onset  . Diabetes Maternal Grandmother   . Diabetes Paternal Grandmother      Current outpatient prescriptions:  .  desogestrel-ethinyl estradiol (APRI,EMOQUETTE,SOLIA) 0.15-30 MG-MCG tablet, Take 1 tablet by mouth daily., Disp: 1 Package, Rfl: 11 .  naproxen (NAPROSYN) 500 MG tablet, Take 1 tablet (500 mg total) by mouth 2 (two) times daily., Disp: 20 tablet, Rfl: 0 .  acetaminophen (TYLENOL) 325 MG tablet, Take 2 tablets (650 mg total) by mouth  every 6 (six) hours as needed. (Patient not taking: Reported on 12/26/2014), Disp: 15 tablet, Rfl: 0 .  amoxicillin (AMOXIL) 500 MG capsule, Take 1 capsule (500 mg total) by mouth 3 (three) times daily. For 10 days (Patient not taking: Reported on 06/12/2014), Disp: 30 capsule, Rfl: 0 .  cyclobenzaprine (FLEXERIL) 10 MG tablet, Take 1 tablet (10 mg total) by mouth 3 (three) times daily as needed for muscle spasms. (Patient not taking: Reported on 09/09/2014), Disp: 30 tablet, Rfl: 0 .  HYDROcodone-acetaminophen (NORCO/VICODIN) 5-325 MG per tablet, Take 1 tablet by mouth every 6 (six) hours as needed. (Patient not taking: Reported on 10/24/2014), Disp: 20 tablet, Rfl: 0 .  ibuprofen (ADVIL,MOTRIN) 800 MG tablet, Take 1 tablet (800 mg total) by mouth 3 (three) times daily. (Patient not taking: Reported on 09/09/2014), Disp: 21 tablet, Rfl: 0 .  methocarbamol (ROBAXIN) 500 MG tablet, Take 1 tablet (500 mg total) by mouth 3 (three) times daily. (Patient not taking: Reported on 09/09/2014), Disp: 21 tablet, Rfl: 0 .  potassium chloride SA (K-DUR,KLOR-CON) 20 MEQ tablet, Take 1 tablet (20 mEq total) by mouth daily. (Patient not taking: Reported on 10/24/2014), Disp: 7 tablet, Rfl: 0 .  traMADol (ULTRAM) 50 MG tablet, Take 1 tablet (50 mg total) by mouth every 6 (six) hours as needed. (Patient not  taking: Reported on 09/09/2014), Disp: 15 tablet, Rfl: 0  Review of Systems  Review of Systems  Constitutional: Negative for fever, chills, weight loss, malaise/fatigue and diaphoresis.  HENT: Negative for hearing loss, ear pain, nosebleeds, congestion, sore throat, neck pain, tinnitus and ear discharge.   Eyes: Negative for blurred vision, double vision, photophobia, pain, discharge and redness.  Respiratory: Negative for cough, hemoptysis, sputum production, shortness of breath, wheezing and stridor.   Cardiovascular: Negative for chest pain, palpitations, orthopnea, claudication, leg swelling and PND.   Gastrointestinal: negative for abdominal pain. Negative for heartburn, nausea, vomiting, diarrhea, constipation, blood in stool and melena.  Genitourinary: Negative for dysuria, urgency, frequency, hematuria and flank pain.  Musculoskeletal: Negative for myalgias, back pain, joint pain and falls.  Skin: Negative for itching and rash.  Neurological: Negative for dizziness, tingling, tremors, sensory change, speech change, focal weakness, seizures, loss of consciousness, weakness and headaches.  Endo/Heme/Allergies: Negative for environmental allergies and polydipsia. Does not bruise/bleed easily.  Psychiatric/Behavioral: Negative for depression, suicidal ideas, hallucinations, memory loss and substance abuse. The patient is not nervous/anxious and does not have insomnia.        Objective:  Blood pressure 120/90, pulse 76, height 5\' 2"  (1.575 m), weight 138 lb 6.4 oz (62.778 kg), last menstrual period 12/22/2014, not currently breastfeeding.   Physical Exam  Vitals reviewed.       Vulva is normal without lesions Vagina is pink moist without discharge Cervix normal in appearance and pap is done Uterus is normal size shape and contour Adnexa is negative with normal sized ovaries  {   Assessment:    Healthy female exam.   Dysmenorrhea Plan:    Contraception: OCP (estrogen/progesterone). Follow up in: 1 year. feldene 20 for pain

## 2014-12-26 NOTE — Addendum Note (Signed)
Addended by: Richardson ChiquitoRAVIS, ASHLEY M on: 12/26/2014 12:12 PM   Modules accepted: Orders

## 2014-12-27 LAB — CYTOLOGY - PAP

## 2014-12-28 ENCOUNTER — Encounter (HOSPITAL_COMMUNITY): Payer: Self-pay | Admitting: *Deleted

## 2014-12-28 DIAGNOSIS — R109 Unspecified abdominal pain: Secondary | ICD-10-CM | POA: Insufficient documentation

## 2014-12-28 DIAGNOSIS — Z720 Tobacco use: Secondary | ICD-10-CM | POA: Diagnosis not present

## 2014-12-28 DIAGNOSIS — N938 Other specified abnormal uterine and vaginal bleeding: Secondary | ICD-10-CM | POA: Diagnosis not present

## 2014-12-28 NOTE — ED Notes (Signed)
abd cramping with abn vag bleeding.Seen in [facility] office 5/17 for same.  Body aches.

## 2014-12-29 ENCOUNTER — Emergency Department (HOSPITAL_COMMUNITY)
Admission: EM | Admit: 2014-12-29 | Discharge: 2014-12-29 | Discharge status: Against Medical Advice | Payer: Medicaid Other | Attending: Emergency Medicine | Admitting: Emergency Medicine

## 2014-12-29 NOTE — ED Notes (Signed)
Pt states she doesn't have a ride home so she cannot stay as her ride is leaving now and will have to leave.

## 2015-01-03 ENCOUNTER — Encounter (HOSPITAL_COMMUNITY): Payer: Self-pay

## 2015-01-03 ENCOUNTER — Emergency Department (HOSPITAL_COMMUNITY)
Admission: EM | Admit: 2015-01-03 | Discharge: 2015-01-03 | Disposition: A | Discharge status: Home / Self Care | Payer: Medicaid Other | Attending: Emergency Medicine | Admitting: Emergency Medicine

## 2015-01-03 DIAGNOSIS — Z791 Long term (current) use of non-steroidal anti-inflammatories (NSAID): Secondary | ICD-10-CM | POA: Diagnosis not present

## 2015-01-03 DIAGNOSIS — Z720 Tobacco use: Secondary | ICD-10-CM | POA: Insufficient documentation

## 2015-01-03 DIAGNOSIS — Z87440 Personal history of urinary (tract) infections: Secondary | ICD-10-CM | POA: Insufficient documentation

## 2015-01-03 DIAGNOSIS — R079 Chest pain, unspecified: Secondary | ICD-10-CM | POA: Diagnosis present

## 2015-01-03 DIAGNOSIS — Z79899 Other long term (current) drug therapy: Secondary | ICD-10-CM | POA: Insufficient documentation

## 2015-01-03 DIAGNOSIS — R0789 Other chest pain: Secondary | ICD-10-CM | POA: Insufficient documentation

## 2015-01-03 DIAGNOSIS — Z792 Long term (current) use of antibiotics: Secondary | ICD-10-CM | POA: Diagnosis not present

## 2015-01-03 DIAGNOSIS — F909 Attention-deficit hyperactivity disorder, unspecified type: Secondary | ICD-10-CM | POA: Diagnosis not present

## 2015-01-03 MED ORDER — ACETAMINOPHEN 500 MG PO TABS
1000.0000 mg | ORAL_TABLET | Freq: Once | ORAL | Status: AC
  Administered 2015-01-03: 1000 mg via ORAL
  Filled 2015-01-03: qty 2

## 2015-01-03 NOTE — ED Notes (Signed)
Pt alert & oriented x4, stable gait. Patient given discharge instructions, paperwork & prescription(s). Patient  instructed to stop at the registration desk to finish any additional paperwork. Patient verbalized understanding. Pt left department w/ no further questions. 

## 2015-01-03 NOTE — Discharge Instructions (Signed)
EKG showed no evidence of a heart attack. Stop smoking. Recommend eating regular meals. Try to get a primary care physician.

## 2015-01-03 NOTE — ED Notes (Signed)
Chest pain x 1 1/2 hour, states recently seen here for same without diagnosis.

## 2015-01-03 NOTE — ED Notes (Signed)
   01/03/15 2209  Chest Pain Assessment  Occurrence Today  Chronicity New  Chest Pain Location Central chest  Pain Descriptors / Indicators Stabbing  Associated Symptoms Lightheadedness;Nausea  Risk Factors Smoking history  pt states she got sick after eating then started having stabbing pains in the center of her chest. Pains worse w/ palpation & movement.

## 2015-01-03 NOTE — ED Notes (Signed)
   01/03/15 2210  N/V/D  Nausea/Vomiting Both  pt states she vomited x 1 after eating.

## 2015-01-03 NOTE — ED Provider Notes (Signed)
CSN: [id number]     Arrival date & time 01/03/15  2205 History  This chart was scribed for Jessica Hutching, MD by Jessica [name], ED Scribe. This patient was seen in room APA06/[facility]06 and the patient's care was started at 10:40 PM.  Chief Complaint  Patient presents with  . Chest Pain   The history is provided by the patient. No language interpreter was used.   HPI Comments: Jessica [name] is a 24 y.o. female who presents to the Emergency Department complaining of constant moderate substernal chest pain that started about 3 hours ago. She reports that she went all day without eating today, then ate about 3 hours ago. She states that after she ate, she immediately had an episode of vomiting and a sudden onset of chest pain. She reports that also had a headache and SOB at the time that has now resolved. She describes the pain as a stabbing sensation. She states that she is a 1 PPD smoker. She denies any diaphoresis.   Past Medical History  Diagnosis Date  . Abnormal Pap smear   . UTI (lower urinary tract infection) 01/12/2013  . ADHD (attention deficit hyperactivity disorder)    Past Surgical History  Procedure Laterality Date  . Tooth extraction    . Cesarean section N/A 09/08/2013    Procedure: CESAREAN SECTION;  Surgeon: Tereso Newcomer, MD;  Location: [facility];  Service: Obstetrics;  Laterality: N/A;   Family History  Problem Relation Age of Onset  . Diabetes Maternal Grandmother   . Diabetes Paternal Grandmother    History  Substance Use Topics  . Smoking status: Current Every Day Smoker -- 1.00 packs/day    Types: Cigarettes  . Smokeless tobacco: Never Used  . Alcohol Use: No   OB History    Gravida Para Term Preterm AB TAB SAB Ectopic Multiple Living   Review of Systems A complete 10 system review of systems was obtained and all systems are negative except as noted in the HPI and PMH.   Allergies  Review of patient's allergies indicates no known  allergies.  Home Medications   Prior to Admission medications   Medication Sig Start Date End Date Taking? Authorizing Provider  desogestrel-ethinyl estradiol (APRI,EMOQUETTE,SOLIA) 0.15-30 MG-MCG tablet Take 1 tablet by mouth daily. 12/26/14  Yes [name], MD  acetaminophen (TYLENOL) 325 MG tablet Take 2 tablets (650 mg total) by mouth every 6 (six) hours as needed. Patient not taking: Reported on 12/26/2014 04/01/14   Azalia Bilis, MD  amoxicillin (AMOXIL) 500 MG capsule Take 1 capsule (500 mg total) by mouth 3 (three) times daily. For 10 days Patient not taking: Reported on 06/12/2014 04/22/14   [name], PA-C  cyclobenzaprine (FLEXERIL) 10 MG tablet Take 1 tablet (10 mg total) by mouth 3 (three) times daily as needed for muscle spasms. Patient not taking: Reported on 09/09/2014 08/01/14   [name], MD  HYDROcodone-acetaminophen (NORCO/VICODIN) 5-325 MG per tablet Take 1 tablet by mouth every 6 (six) hours as needed. Patient not taking: Reported on 10/24/2014 09/09/14   Bethann Berkshire, MD  ibuprofen (ADVIL,MOTRIN) 800 MG tablet Take 1 tablet (800 mg total) by mouth 3 (three) times daily. Patient not taking: Reported on 09/09/2014 08/11/14   [name], PA-C  methocarbamol (ROBAXIN) 500 MG tablet Take 1 tablet (500 mg total) by mouth 3 (three) times daily. Patient not taking: Reported on 09/09/2014 08/11/14  [name], PA-C  naproxen (NAPROSYN) 500 MG tablet Take 1 tablet (500 mg total) by mouth 2 (two) times daily. Patient not taking: Reported on 01/03/2015 10/25/14   Burgess AmorJulie Idol, PA-C  piroxicam (FELDENE) 20 MG capsule Take 1 capsule (20 mg total) by mouth daily. 12/26/14   Lazaro ArmsLuther H Eure, MD  potassium chloride SA (K-DUR,KLOR-CON) 20 MEQ tablet Take 1 tablet (20 mEq total) by mouth daily. Patient not taking: Reported on 10/24/2014 09/09/14   Bethann BerkshireJoseph Zammit, MD  traMADol (ULTRAM) 50 MG tablet Take 1 tablet (50 mg total) by mouth every 6 (six) hours as needed. Patient not taking: Reported  on 09/09/2014 08/11/14   [name], PA-C   BP 114/88 mmHg  Pulse 93  Temp(Src) 98.3 F (36.8 C) (Oral)  Resp 18  Ht 5\' 2"  (1.575 m)  Wt 134 lb (60.782 kg)  BMI 24.50 kg/m2  SpO2 96%  LMP 12/22/2014 (Approximate) Physical Exam  Constitutional: She is oriented to person, place, and time. She appears well-developed and well-nourished.  HENT:  Head: Normocephalic and atraumatic.  Eyes: Conjunctivae and EOM are normal. Pupils are equal, round, and reactive to light.  Neck: Normal range of motion. Neck supple.  Cardiovascular: Normal rate and regular rhythm.   Pulmonary/Chest: Effort normal and breath sounds normal. She exhibits tenderness.  Tenderness to chest wall.   Abdominal: Soft. Bowel sounds are normal.  Musculoskeletal: Normal range of motion.  Neurological: She is alert and oriented to person, place, and time.  Skin: Skin is warm and dry.  Psychiatric: She has a normal mood and affect. Her behavior is normal.  Nursing note and vitals reviewed.   ED Course  Procedures (including critical care time) DIAGNOSTIC STUDIES: Oxygen Saturation is 96% on RA, normal by my interpretation.    COORDINATION OF CARE: 10:44 PM- Pt advised of plan for treatment which includes an EKG and pt agrees.  Labs Review Labs Reviewed - No data to display  Imaging Review No results found.   EKG Interpretation   Date/Time:  Wednesday Jan 03 2015 22:07:02 EDT Ventricular Rate:  106 PR Interval:  146 QRS Duration: 74 QT Interval:  327 QTC Calculation: 434 R Axis:   86 Text Interpretation:  Sinus tachycardia Borderline repolarization  abnormality Confirmed by Celsa Nordahl  MD, Dragan Tamburrino ([id number]) on 01/03/2015 10:55:58 PM      MDM   Final diagnoses:  Chest pain, unspecified chest pain type   Patient is ultra low risk for acute coronary syndrome or pulmonary embolism. EKG shows no acute changes. Pulses normalized in the 80 range. Patient encouraged to stop smoking.  I personally performed the  services described in this documentation, which was scribed in my presence. The recorded information has been reviewed and is accurate.     Jessica HutchingBrian Fread Kottke, MD 01/03/15 [phone]

## 2015-01-03 NOTE — ED Notes (Signed)
Pt states the chest pain started 2 hours ago.

## 2015-01-03 NOTE — ED Notes (Signed)
Pt states she is light headed & her chest still hurts. EDP at bedside.

## 2015-01-25 ENCOUNTER — Other Ambulatory Visit: Payer: Medicaid Other

## 2015-01-25 ENCOUNTER — Ambulatory Visit: Payer: Medicaid Other | Admitting: Obstetrics & Gynecology

## 2015-02-26 IMAGING — DX DG LUMBAR SPINE COMPLETE 4+V
5 series · 5 of 5 positions shown · non-contrast
Comparison: August 01, 2014.

CLINICAL DATA: Lower back pain.

EXAM:
LUMBAR SPINE - COMPLETE 4+ VIEW

[l-spine ap]
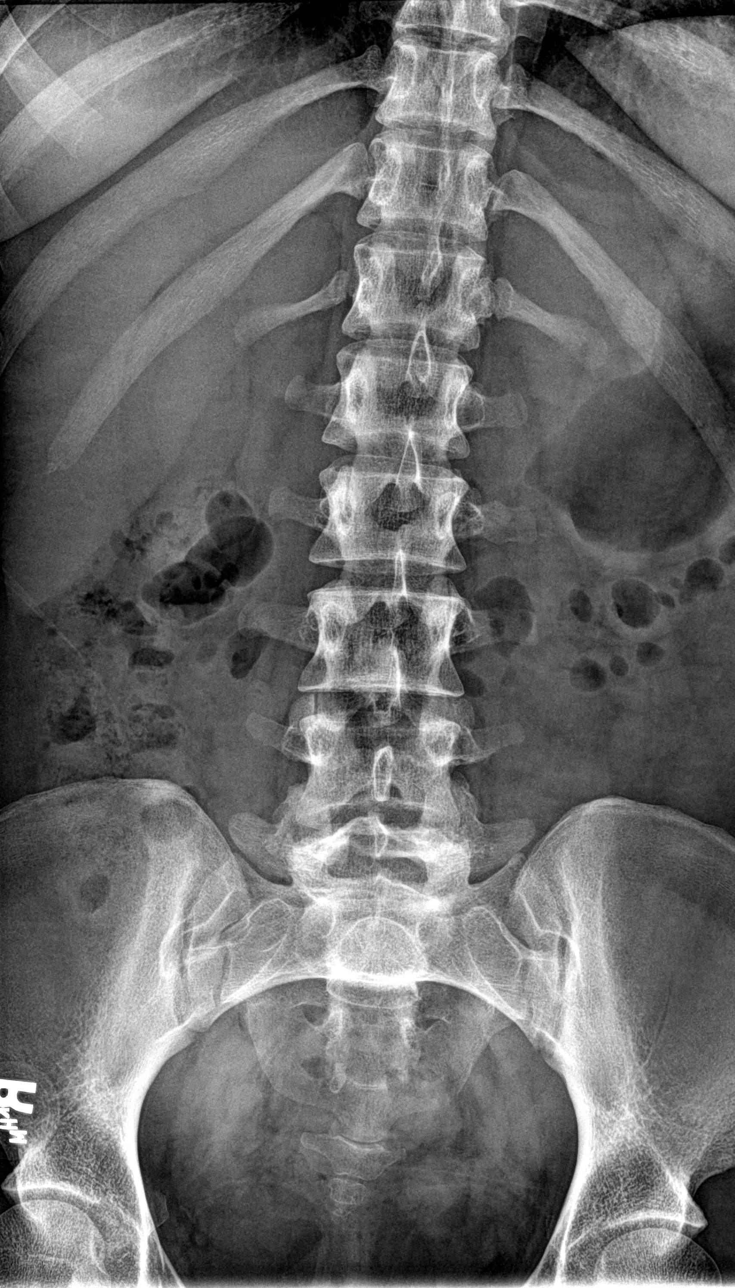

[l-spine obl (1 of 2)]
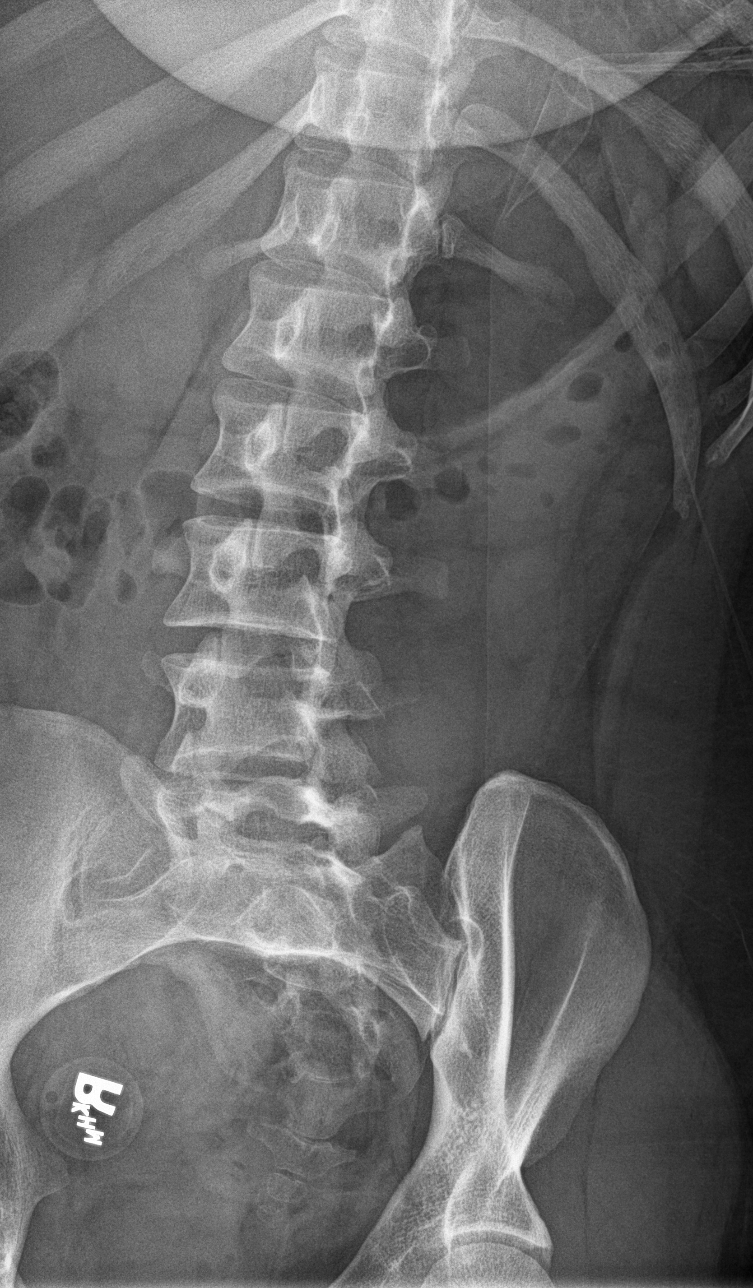

[l-spine obl (2 of 2)]
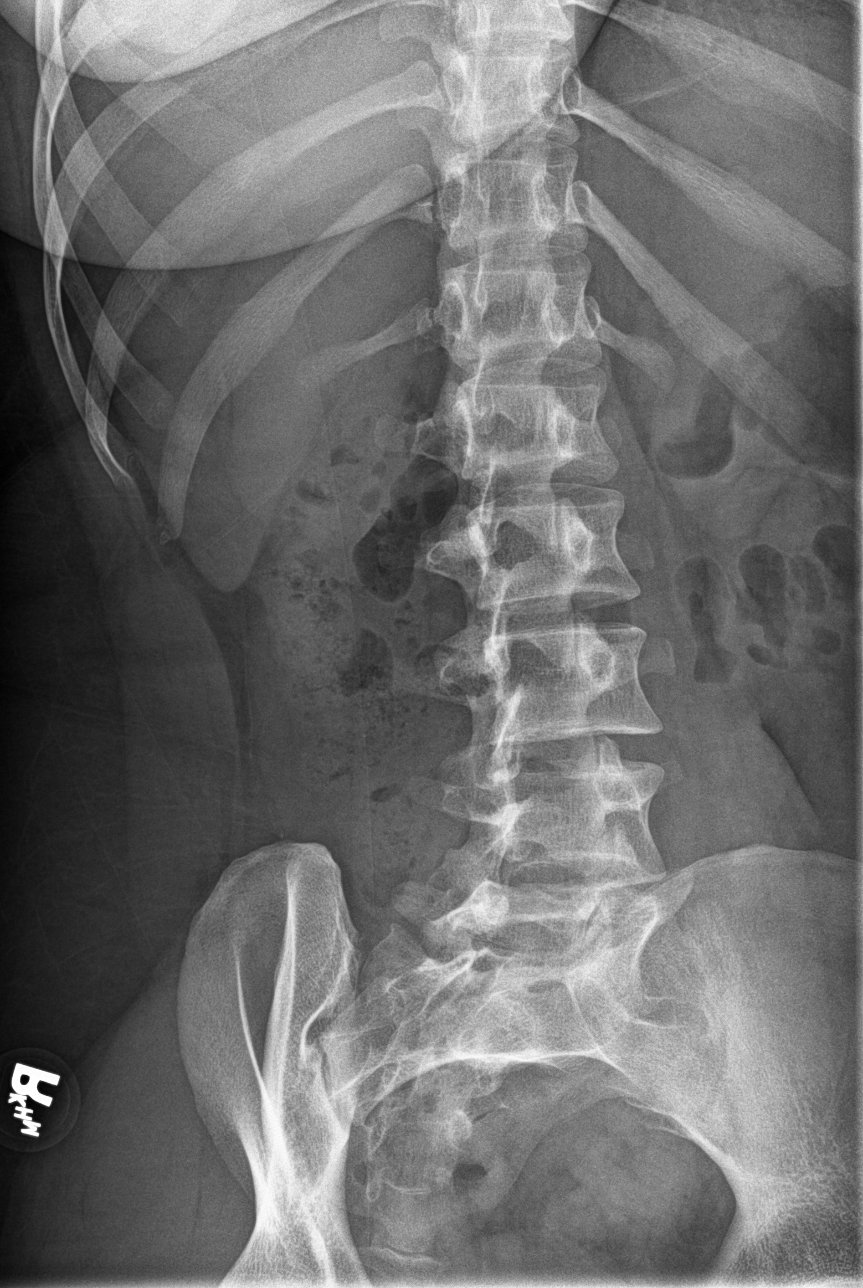

[l-spine lat]
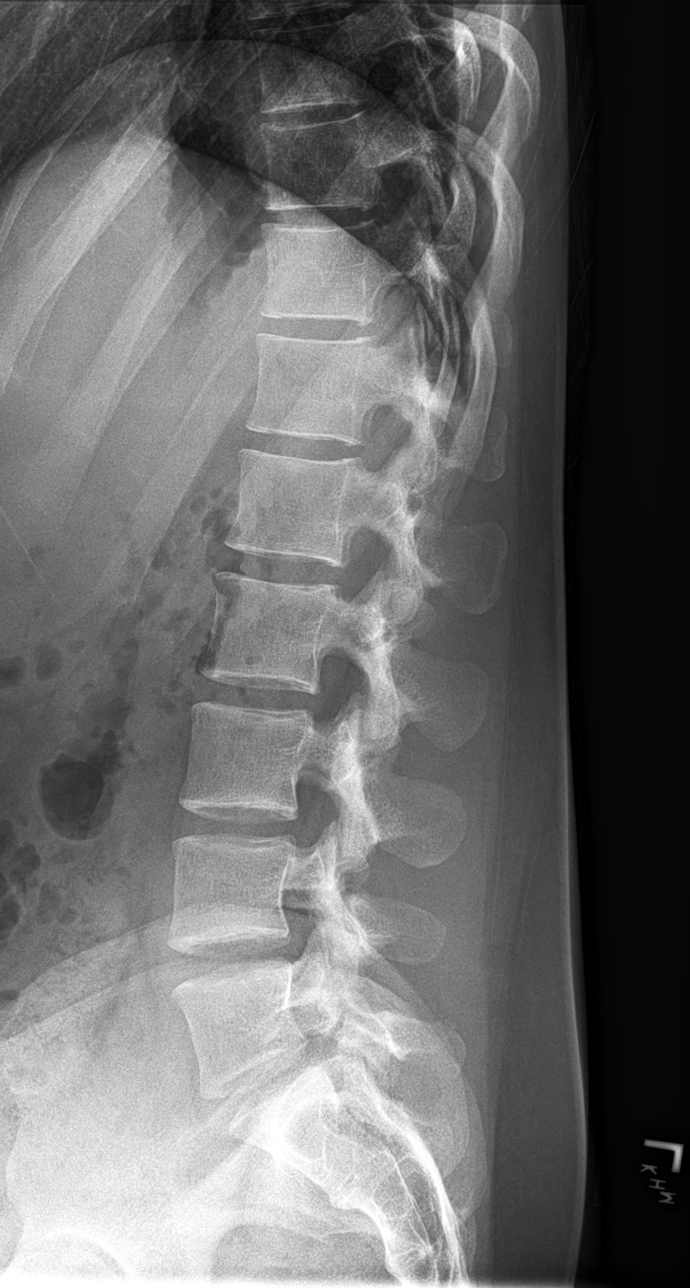

[l-spine spot]
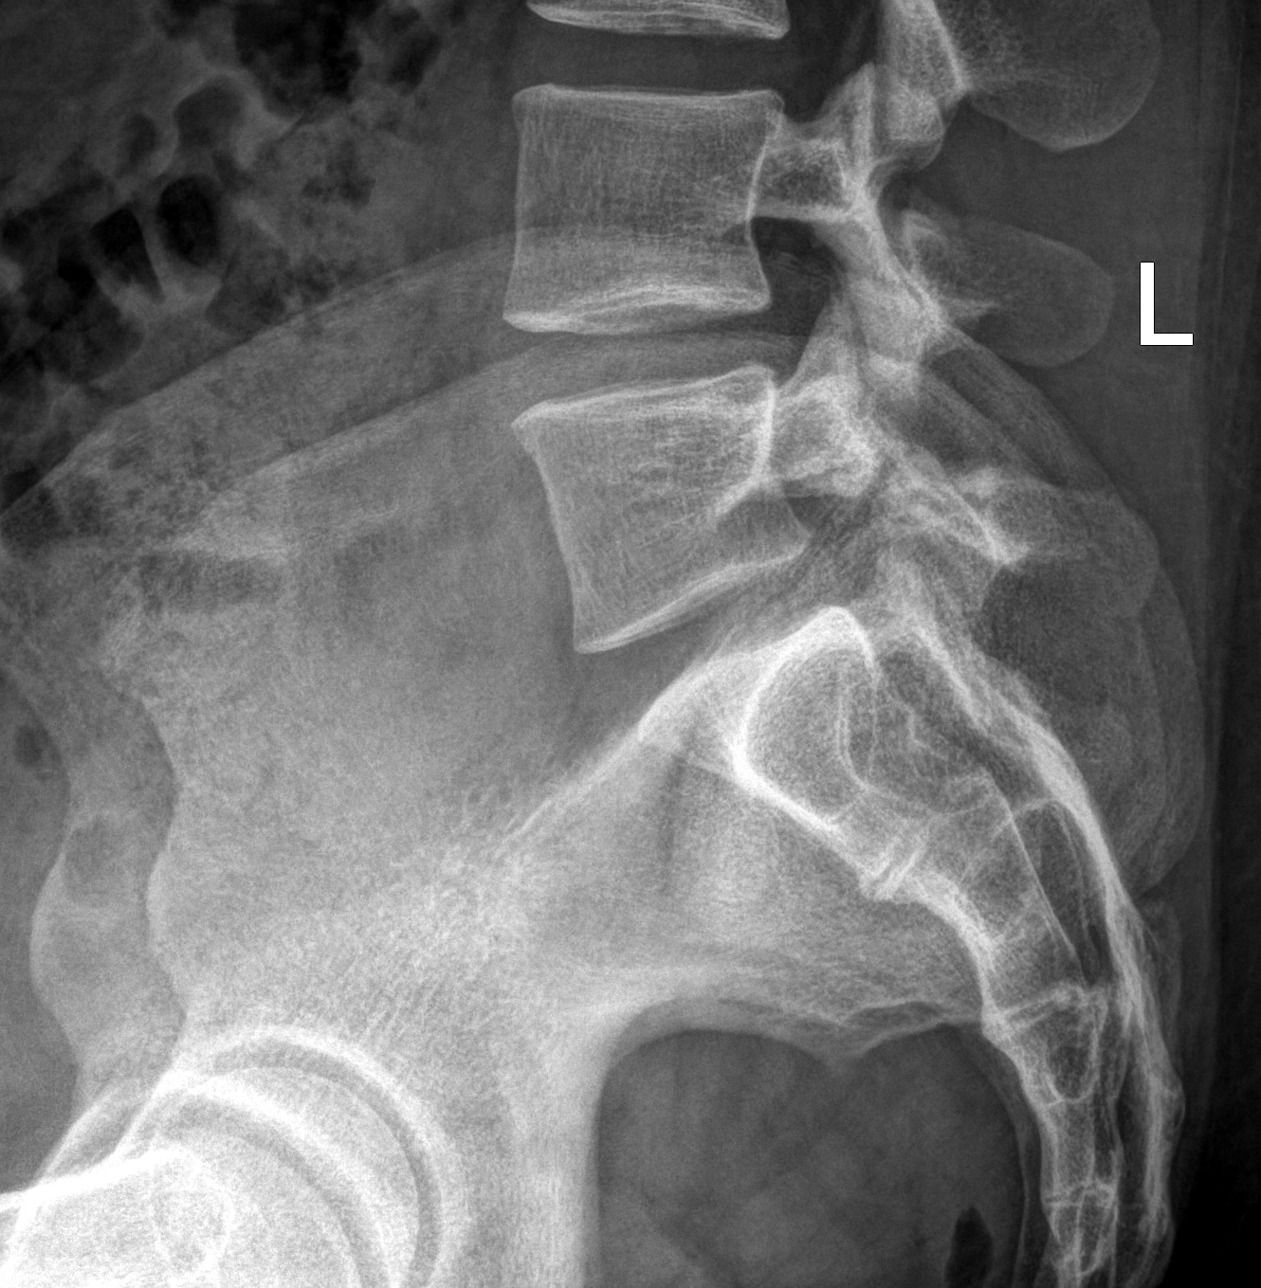

[5 of 5 positions shown; findings below may reference images not displayed]

FINDINGS: No definite evidence of acute fracture or spondylolisthesis is
noted. Probable pars defect is seen at L5. Posterior facet joints
appear normal. Disc spaces are well-maintained.
IMPRESSION: Probable pars defects seen at L5. No acute abnormality seen in the
lumbar spine.

## 2015-04-10 ENCOUNTER — Emergency Department (HOSPITAL_COMMUNITY)
Admission: EM | Admit: 2015-04-10 | Discharge: 2015-04-10 | Disposition: A | Discharge status: Home / Self Care | Payer: Medicaid Other | Attending: Emergency Medicine | Admitting: Emergency Medicine

## 2015-04-10 ENCOUNTER — Encounter (HOSPITAL_COMMUNITY): Payer: Self-pay | Admitting: Emergency Medicine

## 2015-04-10 ENCOUNTER — Emergency Department (HOSPITAL_COMMUNITY): Payer: Medicaid Other

## 2015-04-10 DIAGNOSIS — Z8659 Personal history of other mental and behavioral disorders: Secondary | ICD-10-CM | POA: Diagnosis not present

## 2015-04-10 DIAGNOSIS — Z9889 Other specified postprocedural states: Secondary | ICD-10-CM | POA: Diagnosis not present

## 2015-04-10 DIAGNOSIS — R102 Pelvic and perineal pain: Secondary | ICD-10-CM

## 2015-04-10 DIAGNOSIS — Z79899 Other long term (current) drug therapy: Secondary | ICD-10-CM | POA: Diagnosis not present

## 2015-04-10 DIAGNOSIS — N939 Abnormal uterine and vaginal bleeding, unspecified: Secondary | ICD-10-CM

## 2015-04-10 DIAGNOSIS — Z3202 Encounter for pregnancy test, result negative: Secondary | ICD-10-CM | POA: Insufficient documentation

## 2015-04-10 DIAGNOSIS — Z720 Tobacco use: Secondary | ICD-10-CM | POA: Diagnosis not present

## 2015-04-10 DIAGNOSIS — Z87440 Personal history of urinary (tract) infections: Secondary | ICD-10-CM | POA: Diagnosis not present

## 2015-04-10 DIAGNOSIS — R109 Unspecified abdominal pain: Secondary | ICD-10-CM | POA: Diagnosis present

## 2015-04-10 DIAGNOSIS — N946 Dysmenorrhea, unspecified: Secondary | ICD-10-CM

## 2015-04-10 LAB — CBC WITH DIFFERENTIAL/PLATELET
Basophils Absolute: 0 10*3/uL (ref 0.0–0.1)
Basophils Relative: 0 % (ref 0–1)
Eosinophils Absolute: 0.1 10*3/uL (ref 0.0–0.7)
Eosinophils Relative: 1 % (ref 0–5)
HCT: 35 % — ABNORMAL LOW (ref 36.0–46.0)
Hemoglobin: 12.2 g/dL (ref 12.0–15.0)
Lymphocytes Relative: 31 % (ref 12–46)
Lymphs Abs: 2.2 10*3/uL (ref 0.7–4.0)
MCH: 29.3 pg (ref 26.0–34.0)
MCHC: 34.9 g/dL (ref 30.0–36.0)
MCV: 83.9 fL (ref 78.0–100.0)
Monocytes Absolute: 0.3 10*3/uL (ref 0.1–1.0)
Monocytes Relative: 4 % (ref 3–12)
Neutro Abs: 4.5 10*3/uL (ref 1.7–7.7)
Neutrophils Relative %: 64 % (ref 43–77)
Platelets: 218 10*3/uL (ref 150–400)
RBC: 4.17 MIL/uL (ref 3.87–5.11)
RDW: 12.4 % (ref 11.5–15.5)
WBC: 7.1 10*3/uL (ref 4.0–10.5)

## 2015-04-10 LAB — URINALYSIS W MICROSCOPIC (NOT AT ARMC)
Bilirubin Urine: NEGATIVE
Glucose, UA: NEGATIVE mg/dL
Ketones, ur: NEGATIVE mg/dL
Leukocytes, UA: NEGATIVE
Nitrite: NEGATIVE
Protein, ur: 100 mg/dL — AB
Specific Gravity, Urine: 1.025 (ref 1.005–1.030)
Urobilinogen, UA: 0.2 mg/dL (ref 0.0–1.0)
pH: 6.5 (ref 5.0–8.0)

## 2015-04-10 LAB — COMPREHENSIVE METABOLIC PANEL
ALT: 12 U/L — ABNORMAL LOW (ref 14–54)
AST: 15 U/L (ref 15–41)
Albumin: 3.8 g/dL (ref 3.5–5.0)
Alkaline Phosphatase: 37 U/L — ABNORMAL LOW (ref 38–126)
Anion gap: 4 — ABNORMAL LOW (ref 5–15)
BUN: 10 mg/dL (ref 6–20)
CO2: 22 mmol/L (ref 22–32)
Calcium: 8.7 mg/dL — ABNORMAL LOW (ref 8.9–10.3)
Chloride: 111 mmol/L (ref 101–111)
Creatinine, Ser: 0.78 mg/dL (ref 0.44–1.00)
GFR calc Af Amer: >60 mL/min (ref >60)
GFR calc non Af Amer: >60 mL/min (ref >60)
Glucose, Bld: 95 mg/dL (ref 65–99)
Potassium: 3.5 mmol/L (ref 3.5–5.1)
Sodium: 137 mmol/L (ref 135–145)
Total Bilirubin: 0.4 mg/dL (ref 0.3–1.2)
Total Protein: 6.8 g/dL (ref 6.5–8.1)

## 2015-04-10 LAB — CBG MONITORING, ED: Glucose-Capillary: 94 mg/dL (ref 65–99)

## 2015-04-10 LAB — WET PREP, GENITAL
Clue Cells Wet Prep HPF POC: NONE SEEN
Trich, Wet Prep: NONE SEEN
Yeast Wet Prep HPF POC: NONE SEEN

## 2015-04-10 LAB — PREGNANCY, URINE: Preg Test, Ur: NEGATIVE

## 2015-04-10 MED ORDER — HYDROMORPHONE HCL 1 MG/ML IJ SOLN
1.0000 mg | Freq: Once | INTRAMUSCULAR | Status: AC
  Administered 2015-04-10: 1 mg via INTRAVENOUS
  Filled 2015-04-10: qty 1

## 2015-04-10 MED ORDER — SODIUM CHLORIDE 0.9 % IV BOLUS (SEPSIS)
1000.0000 mL | Freq: Once | INTRAVENOUS | Status: AC
  Administered 2015-04-10: 1000 mL via INTRAVENOUS

## 2015-04-10 MED ORDER — HYDROCODONE-ACETAMINOPHEN 5-325 MG PO TABS
1.0000 | ORAL_TABLET | Freq: Once | ORAL | Status: AC
  Administered 2015-04-10: 1 via ORAL
  Filled 2015-04-10: qty 1

## 2015-04-10 MED ORDER — OXYCODONE-ACETAMINOPHEN 5-325 MG PO TABS: 1.0000 | ORAL_TABLET | ORAL | Status: DC | PRN

## 2015-04-10 NOTE — ED Notes (Signed)
Pt states her pain is no better and now it is going down both legs. Pt moaning when in room.  Pt also requesting something to eat and drink.

## 2015-04-10 NOTE — ED Notes (Signed)
Jessica [name] in lab called critical glucose result.  Pt awake, alert, oriented.   Notified Dr. Effie Shy, obtained cbg and cbg was 94.  Notified EDP and Jessica [name] in the lab.  Jessica [name] says he will run the sample on another machine.

## 2015-04-10 NOTE — ED Provider Notes (Signed)
CSN: [id number]     Arrival date & time 04/10/15  1147 History   First MD Initiated Contact with Patient 04/10/15 1325     Chief Complaint  Patient presents with  . Vaginal Bleeding  . Abdominal Pain     (Consider location/radiation/quality/duration/timing/severity/associated sxs/prior Treatment) HPI   Jessica [name] is a 24 y.o. female who presents for evaluation of vaginal bleeding. She is concerned that the vaginal bleeding was caused by, to the lower abdomen and upper leg, which occurred when she climbed through a window to get into a house. She had to climb up" on four 4 chairs", and then crawl across the windowsill. She has bruising, from this. Her last menstrual period was 4 weeks ago. She does not believe that the vaginal bleeding, today represents a menses. She is on oral contraception. She denies nausea, vomiting, weakness, dizziness, fever, chills, cough or chest pain. There are no other known modifying factors.   Past Medical History  Diagnosis Date  . Abnormal Pap smear   . UTI (lower urinary tract infection) 01/12/2013  . ADHD (attention deficit hyperactivity disorder)    Past Surgical History  Procedure Laterality Date  . Tooth extraction    . Cesarean section N/A 09/08/2013    Procedure: CESAREAN SECTION;  Surgeon: Tereso Newcomer, MD;  Location: [facility];  Service: Obstetrics;  Laterality: N/A;   Family History  Problem Relation Age of Onset  . Diabetes Maternal Grandmother   . Diabetes Paternal Grandmother    Social History  Substance Use Topics  . Smoking status: Current Every Day Smoker -- 1.00 packs/day    Types: Cigarettes  . Smokeless tobacco: Never Used  . Alcohol Use: No   OB History    Gravida Para Term Preterm AB TAB SAB Ectopic Multiple Living   Review of Systems  All other systems reviewed and are negative.     Allergies  Review of patient's allergies indicates no known allergies.  Home Medications   Prior to  Admission medications   Medication Sig Start Date End Date Taking? Authorizing Provider  Acetaminophen-Caff-Pyrilamine (MIDOL COMPLETE) 500-60-15 MG TABS Take 2 tablets by mouth daily as needed (pain).   Yes Historical Provider, MD  desogestrel-ethinyl estradiol (APRI,EMOQUETTE,SOLIA) 0.15-30 MG-MCG tablet Take 1 tablet by mouth daily. 12/26/14  Yes [name], MD  cyclobenzaprine (FLEXERIL) 10 MG tablet Take 1 tablet (10 mg total) by mouth 3 (three) times daily as needed for muscle spasms. Patient not taking: Reported on 09/09/2014 08/01/14   [name], MD  oxyCODONE-acetaminophen (PERCOCET) 5-325 MG per tablet Take 1 tablet by mouth every 4 (four) hours as needed. 04/10/15   Mancel Bale, MD   BP 118/93 mmHg  Pulse 83  Temp(Src) 98.2 F (36.8 C) (Oral)  Resp 16  Ht  (1.575 m)  Wt 134 lb (60.782 kg)  BMI 24.50 kg/m2  SpO2 96%  LMP 03/11/2015 Physical Exam  Constitutional: She is oriented to person, place, and time. She appears well-developed and well-nourished.  HENT:  Head: Normocephalic and atraumatic.  Right Ear: External ear normal.  Left Ear: External ear normal.  Eyes: Conjunctivae and EOM are normal. Pupils are equal, round, and reactive to light.  Neck: Normal range of motion and phonation normal. Neck supple.  Cardiovascular: Normal rate, regular rhythm and normal heart sounds.   Pulmonary/Chest: Effort normal and breath sounds normal. She exhibits no bony tenderness.  Abdominal:  Soft. There is no tenderness.  Mild bruising, right lower abdominal wall, and right upper thigh. Mild tenderness of the bilateral lower quadrants. No midline suprapubic tenderness or mass.  Musculoskeletal: Normal range of motion. She exhibits no tenderness.  Normal range of motion, arms and legs.  Neurological: She is alert and oriented to person, place, and time. No cranial nerve deficit or sensory deficit. She exhibits normal muscle tone. Coordination normal.  Skin: Skin is warm, dry and  intact.  Psychiatric: She has a normal mood and affect. Her behavior is normal. Judgment and thought content normal.  Nursing note and vitals reviewed.   ED Course  Procedures (including critical care time)  Medications  sodium chloride 0.9 % bolus 1,000 mL (0 mLs Intravenous Stopped 04/10/15 1522)  HYDROcodone-acetaminophen (NORCO/VICODIN) 5-325 MG per tablet 1 tablet (1 tablet Oral Given 04/10/15 1407)  HYDROmorphone (DILAUDID) injection 1 mg (1 mg Intravenous Given 04/10/15 1522)  HYDROmorphone (DILAUDID) injection 1 mg (1 mg Intravenous Given 04/10/15 1607)  HYDROmorphone (DILAUDID) injection 1 mg (1 mg Intravenous Given 04/10/15 1735)    No data found.   5:29 PM Reevaluation with update and discussion. After initial assessment and treatment, an updated evaluation reveals she states that she needs "a third dose". She declined Toradol as it "does not work". Findings discussed with the patient, all questions were answered. Sarinah Doetsch L    Labs Review Labs Reviewed  WET PREP, GENITAL - Abnormal; Notable for the following:    WBC, Wet Prep HPF POC FEW (*)    All other components within normal limits  URINALYSIS W MICROSCOPIC - Abnormal; Notable for the following:    Color, Urine RED (*)    APPearance CLOUDY (*)    Hgb urine dipstick LARGE (*)    Protein, ur 100 (*)    Bacteria, UA MANY (*)    Squamous Epithelial / LPF FEW (*)    All other components within normal limits  COMPREHENSIVE METABOLIC PANEL - Abnormal; Notable for the following:    Calcium 8.7 (*)    ALT 12 (*)    Alkaline Phosphatase 37 (*)    Anion gap 4 (*)    All other components within normal limits  CBC WITH DIFFERENTIAL/PLATELET - Abnormal; Notable for the following:    HCT 35.0 (*)    All other components within normal limits  PREGNANCY, URINE  RPR  HIV ANTIBODY (ROUTINE TESTING)  CBG MONITORING, ED  GC/CHLAMYDIA PROBE AMP ([place]) NOT AT [facility]    Imaging Review US Transvaginal  Non-ob  04/10/2015   CLINICAL DATA:  Pelvic pain and heavy vaginal bleeding after a fall today. Initial encounter.  EXAM: TRANSABDOMINAL AND TRANSVAGINAL ULTRASOUND OF PELVIS  TECHNIQUE: Both transabdominal and transvaginal ultrasound examinations of the pelvis were performed. Transabdominal technique was performed for global imaging of the pelvis including uterus, ovaries, adnexal regions, and pelvic cul-de-sac. It was necessary to proceed with endovaginal exam following the transabdominal exam to visualize the endometrium and ovaries.  COMPARISON:  CT abdomen and pelvis 01/09/2014  FINDINGS: Uterus  Measurements: 7.6 x 3.3 x 4.2 cm. No fibroids or other mass visualized.  Endometrium  Thickness: 5.5 mm.  No focal abnormality visualized.  Right ovary  Measurements: 2.5 x 1.7 x 2.8 cm. Normal appearance/no adnexal mass.  Left ovary  Measurements: 2.3 x 1.6 x 1.8 cm. Normal appearance/no adnexal mass.  Other findings  No free fluid.  IMPRESSION: Unremarkable pelvic ultrasound.   Electronically Signed   By: Jolaine Click.D.  On: 04/10/2015 16:40   US Pelvis Complete  04/10/2015   CLINICAL DATA:  Pelvic pain and heavy vaginal bleeding after a fall today. Initial encounter.  EXAM: TRANSABDOMINAL AND TRANSVAGINAL ULTRASOUND OF PELVIS  TECHNIQUE: Both transabdominal and transvaginal ultrasound examinations of the pelvis were performed. Transabdominal technique was performed for global imaging of the pelvis including uterus, ovaries, adnexal regions, and pelvic cul-de-sac. It was necessary to proceed with endovaginal exam following the transabdominal exam to visualize the endometrium and ovaries.  COMPARISON:  CT abdomen and pelvis 01/09/2014  FINDINGS: Uterus  Measurements: 7.6 x 3.3 x 4.2 cm. No fibroids or other mass visualized.  Endometrium  Thickness: 5.5 mm.  No focal abnormality visualized.  Right ovary  Measurements: 2.5 x 1.7 x 2.8 cm. Normal appearance/no adnexal mass.  Left ovary  Measurements: 2.3 x 1.6 x  1.8 cm. Normal appearance/no adnexal mass.  Other findings  No free fluid.  IMPRESSION: Unremarkable pelvic ultrasound.   Electronically Signed   By: Sebastian Ache M.D.   On: 04/10/2015 16:40   I have personally reviewed and evaluated these images and lab results as part of my medical decision-making.   EKG Interpretation None      MDM   Final diagnoses:  Dysmenorrhea     Dysmenorrhea, with abnormal vaginal bleeding. No visible pelvic pathology on ultrasound screening. Doubt cervicitis or PID.  Nursing Notes Reviewed/ Care Coordinated Applicable Imaging Reviewed Interpretation of Laboratory Data incorporated into ED treatment  The patient appears reasonably screened and/or stabilized for discharge and I doubt any other medical condition or other [facility] requiring further screening, evaluation, or treatment in the ED at this time prior to discharge.  Plan: Home Medications- Percocet; Home Treatments- rest; return here if the recommended treatment, does not improve the symptoms; Recommended follow up- GYN, one week     Mancel Bale, MD 04/11/15 2306

## 2015-04-10 NOTE — ED Notes (Signed)
Pt rates a pain a 9/10.  Asking for something to eat and drink.  Pt crying.

## 2015-04-10 NOTE — ED Notes (Signed)
CRITICAL VALUE ALERT  Critical value received:  Glucose 37  Date of notification:  04/10/2015 Time of notification:  1446  Critical value read back:Yes.    Nurse who received alert:  [facility] RN  MD notified (1st page):  Dr. Effie Shy  Time of first page:  1446  MD notified (2nd page):  Time of second page:  Responding MD:  Dr. Effie Shy  Time MD responded:  [phone]

## 2015-04-10 NOTE — ED Notes (Signed)
Jessica [name] in lab reports he repeated glucose 3 times and result was 95.

## 2015-04-10 NOTE — ED Notes (Signed)
Working on [employer] and injury to abdominal.  Started having several vaginal bleeding afterwards.  Rates pain 9/10.  Soak 3-4- pads every 2 hours.  Having large clots.

## 2015-04-10 NOTE — Discharge Instructions (Signed)
Pelvic Pain Female pelvic pain can be caused by many different things and start from a variety of places. Pelvic pain refers to pain that is located in the lower half of the abdomen and between your hips. The pain may occur over a short period of time (acute) or may be reoccurring (chronic). The cause of pelvic pain may be related to disorders affecting the female reproductive organs (gynecologic), but it may also be related to the bladder, kidney stones, an intestinal complication, or muscle or skeletal problems. Getting help right away for pelvic pain is important, especially if there has been severe, sharp, or a sudden onset of unusual pain. It is also important to get help right away because some types of pelvic pain can be life threatening.  CAUSES  Below are only some of the causes of pelvic pain. The causes of pelvic pain can be in one of several categories.   Gynecologic.  Pelvic inflammatory disease.  Sexually transmitted infection.  Ovarian cyst or a twisted ovarian ligament (ovarian torsion).  Uterine lining that grows outside the uterus (endometriosis).  Fibroids, cysts, or tumors.  Ovulation.  Pregnancy.  Pregnancy that occurs outside the uterus (ectopic pregnancy).  Miscarriage.  Labor.  Abruption of the placenta or ruptured uterus.  Infection.  Uterine infection (endometritis).  Bladder infection.  Diverticulitis.  Miscarriage related to a uterine infection (septic abortion).  Bladder.  Inflammation of the bladder (cystitis).  Kidney stone(s).  Gastrointestinal.  Constipation.  Diverticulitis.  Neurologic.  Trauma.  Feeling pelvic pain because of mental or emotional causes (psychosomatic).  Cancers of the bowel or pelvis. EVALUATION  Your caregiver will want to take a careful history of your concerns. This includes recent changes in your health, a careful gynecologic history of your periods (menses), and a sexual history. Obtaining your family  history and medical history is also important. Your caregiver may suggest a pelvic exam. A pelvic exam will help identify the location and severity of the pain. It also helps in the evaluation of which organ system may be involved. In order to identify the cause of the pelvic pain and be properly treated, your caregiver may order tests. These tests may include:   A pregnancy test.  Pelvic ultrasonography.  An X-ray exam of the abdomen.  A urinalysis or evaluation of vaginal discharge.  Blood tests. HOME CARE INSTRUCTIONS   Only take over-the-counter or prescription medicines for pain, discomfort, or fever as directed by your caregiver.   Rest as directed by your caregiver.   Eat a balanced diet.   Drink enough fluids to make your urine clear or pale yellow, or as directed.   Avoid sexual intercourse if it causes pain.   Apply warm or cold compresses to the lower abdomen depending on which one helps the pain.   Avoid stressful situations.   Keep a journal of your pelvic pain. Write down when it started, where the pain is located, and if there are things that seem to be associated with the pain, such as food or your menstrual cycle.  Follow up with your caregiver as directed.  SEEK MEDICAL CARE IF:  Your medicine does not help your pain.  You have abnormal vaginal discharge. SEEK IMMEDIATE MEDICAL CARE IF:   You have heavy bleeding from the vagina.   Your pelvic pain increases.   You feel light-headed or faint.   You have chills.   You have pain with urination or blood in your urine.   You have uncontrolled diarrhea   or vomiting.   You have a fever or persistent symptoms for more than 3 days.  You have a fever and your symptoms suddenly get worse.   You are being physically or sexually abused.  MAKE SURE YOU:  Understand these instructions.  Will watch your condition.  Will get help if you are not doing well or get worse. Document Released:  06/24/2004 Document Revised: 12/12/2013 Document Reviewed: 11/17/2011 Exit[employer] Patient Information 2015 [employer]. This information is not intended to replace advice given to you by your health care provider. Make sure you discuss any questions you have with your health care provider.  

## 2015-04-10 NOTE — ED Notes (Addendum)
In room to answer call light.  Pt crying and stating "it hurts,  Its going down my legs"  ER physician notified.

## 2015-04-11 LAB — GC/CHLAMYDIA PROBE AMP (~~LOC~~) NOT AT ARMC
Chlamydia: NEGATIVE
Neisseria Gonorrhea: NEGATIVE

## 2015-04-11 LAB — HIV ANTIBODY (ROUTINE TESTING W REFLEX): HIV Screen 4th Generation wRfx: NONREACTIVE

## 2015-04-11 LAB — RPR: RPR Ser Ql: NONREACTIVE

## 2015-05-26 ENCOUNTER — Encounter (HOSPITAL_COMMUNITY): Payer: Self-pay | Admitting: Vascular Surgery

## 2015-05-26 ENCOUNTER — Emergency Department (HOSPITAL_COMMUNITY)
Admission: EM | Admit: 2015-05-26 | Discharge: 2015-05-26 | Disposition: A | Discharge status: Home / Self Care | Payer: Medicaid Other | Attending: Emergency Medicine | Admitting: Emergency Medicine

## 2015-05-26 DIAGNOSIS — Z720 Tobacco use: Secondary | ICD-10-CM | POA: Diagnosis not present

## 2015-05-26 DIAGNOSIS — Z8659 Personal history of other mental and behavioral disorders: Secondary | ICD-10-CM | POA: Diagnosis not present

## 2015-05-26 DIAGNOSIS — K920 Hematemesis: Secondary | ICD-10-CM | POA: Insufficient documentation

## 2015-05-26 DIAGNOSIS — Z87440 Personal history of urinary (tract) infections: Secondary | ICD-10-CM | POA: Insufficient documentation

## 2015-05-26 DIAGNOSIS — R1084 Generalized abdominal pain: Secondary | ICD-10-CM | POA: Diagnosis not present

## 2015-05-26 DIAGNOSIS — Z793 Long term (current) use of hormonal contraceptives: Secondary | ICD-10-CM | POA: Diagnosis not present

## 2015-05-26 LAB — COMPREHENSIVE METABOLIC PANEL
ALT: 12 U/L — ABNORMAL LOW (ref 14–54)
AST: 18 U/L (ref 15–41)
Albumin: 4.2 g/dL (ref 3.5–5.0)
Alkaline Phosphatase: 45 U/L (ref 38–126)
Anion gap: 10 (ref 5–15)
BUN: 7 mg/dL (ref 6–20)
CO2: 22 mmol/L (ref 22–32)
Calcium: 9.6 mg/dL (ref 8.9–10.3)
Chloride: 107 mmol/L (ref 101–111)
Creatinine, Ser: 0.77 mg/dL (ref 0.44–1.00)
GFR calc Af Amer: >60 mL/min (ref >60)
GFR calc non Af Amer: >60 mL/min (ref >60)
Glucose, Bld: 99 mg/dL (ref 65–99)
Potassium: 3.6 mmol/L (ref 3.5–5.1)
Sodium: 139 mmol/L (ref 135–145)
Total Bilirubin: 0.5 mg/dL (ref 0.3–1.2)
Total Protein: 7.3 g/dL (ref 6.5–8.1)

## 2015-05-26 LAB — CBC
HCT: 40.4 % (ref 36.0–46.0)
Hemoglobin: 14.1 g/dL (ref 12.0–15.0)
MCH: 29.4 pg (ref 26.0–34.0)
MCHC: 34.9 g/dL (ref 30.0–36.0)
MCV: 84.2 fL (ref 78.0–100.0)
Platelets: 243 10*3/uL (ref 150–400)
RBC: 4.8 MIL/uL (ref 3.87–5.11)
RDW: 12.4 % (ref 11.5–15.5)
WBC: 7.3 10*3/uL (ref 4.0–10.5)

## 2015-05-26 LAB — URINALYSIS, ROUTINE W REFLEX MICROSCOPIC
Bilirubin Urine: NEGATIVE
Glucose, UA: NEGATIVE mg/dL
Hgb urine dipstick: NEGATIVE
Ketones, ur: NEGATIVE mg/dL
Leukocytes, UA: NEGATIVE
Nitrite: NEGATIVE
Protein, ur: NEGATIVE mg/dL
Specific Gravity, Urine: 1.014 (ref 1.005–1.030)
Urobilinogen, UA: 0.2 mg/dL (ref 0.0–1.0)
pH: 7.5 (ref 5.0–8.0)

## 2015-05-26 LAB — POC OCCULT BLOOD, ED: Fecal Occult Bld: NEGATIVE

## 2015-05-26 MED ORDER — GI COCKTAIL ~~LOC~~
30.0000 mL | Freq: Once | ORAL | Status: AC
Start: 2015-05-26 — End: 2015-05-26
  Administered 2015-05-26: 30 mL via ORAL
  Filled 2015-05-26: qty 30

## 2015-05-26 MED ORDER — TRAMADOL HCL 50 MG PO TABS: 50.0000 mg | ORAL_TABLET | Freq: Four times a day (QID) | ORAL | Status: DC | PRN

## 2015-05-26 MED ORDER — SUCRALFATE 1 GM/10ML PO SUSP: 1.0000 g | Freq: Three times a day (TID) | ORAL | Status: DC

## 2015-05-26 MED ORDER — PROMETHAZINE HCL 25 MG PO TABS: 25.0000 mg | ORAL_TABLET | Freq: Four times a day (QID) | ORAL | Status: DC | PRN

## 2015-05-26 MED ORDER — PANTOPRAZOLE SODIUM 20 MG PO TBEC: 20.0000 mg | DELAYED_RELEASE_TABLET | Freq: Every day | ORAL | Status: DC

## 2015-05-26 MED ORDER — METOCLOPRAMIDE HCL 5 MG/ML IJ SOLN
10.0000 mg | Freq: Once | INTRAMUSCULAR | Status: AC
  Administered 2015-05-26: 10 mg via INTRAVENOUS
  Filled 2015-05-26: qty 2

## 2015-05-26 NOTE — Discharge Instructions (Signed)
Hematemesis Hematemesis is when you vomit blood. It is a sign of bleeding in the upper part of your digestive tract. This is also called your gastrointestinal (GI) tract. Your upper GI tract includes your mouth, throat, esophagus, stomach, and the first part of your small intestine (duodenum).  Hematemesis is usually caused by bleeding from your esophagus or stomach. You may suddenly vomit bright red blood. You might also vomit old blood. It may look like coffee grounds. You may also have other symptoms, such as:  Stomach pain.  Heartburn.  Black and tarry stool.  HOME CARE INSTRUCTIONS  Watch your hematemesis for any changes. The following actions may help to lessen any discomfort you are feeling:  Take medicines only as directed by your health care provider. Do not take aspirin, ibuprofen, or any other anti-inflammatory medicine without approval from your health care provider.  Rest as needed.  Drink small sips of clear liquids often, as long as you can keep them down. Try to drink enough fluids to keep your urine clear or pale yellow.  Do not drink alcohol.  Do not use any tobacco products, including cigarettes, chewing tobacco, or electronic cigarettes. If you need help quitting, ask your health care provider.  Keep all follow-up visits as directed by your health care provider. This is important. SEEK MEDICAL CARE IF:   The vomiting of blood worsens, or begins again after it has stopped.  You have persistent stomach pain.  You have nausea, indigestion, or heartburn.  You feel weak or dizzy. SEEK IMMEDIATE MEDICAL CARE IF:   You faint or feel extremely weak.  You have a rapid heartbeat.  You are urinating less than normal or not at all.  You have persistent vomiting.  You vomit large amounts of bloody or dark material.  You vomit bright red blood.  You pass large, dark, or bloody stools.  You have chest pain or trouble breathing.   This information is not  intended to replace advice given to you by your health care provider. Make sure you discuss any questions you have with your health care provider.   Document Released: 09/04/2004 Document Revised: 08/18/2014 Document Reviewed: 03/22/2014 Elsevier Interactive Patient Education 2016 [employer]  

## 2015-05-26 NOTE — ED Notes (Signed)
Pt refusing NG tube.

## 2015-05-26 NOTE — ED Notes (Signed)
Pt reports to the ED for eval of hematemesis greater than 10 times. This started today. Denies any diahrrea. Also sore throat and like something is stuck in her throat. Pt denies any ETOH use, red food/drink consumption, or usual food. States mild abd pain. Pt A&Ox4, resp e/u, and skin warm and dry.

## 2015-05-26 NOTE — ED Provider Notes (Signed)
CSN: [id number]     Arrival date & time 05/26/15  1912 History   First MD Initiated Contact with Patient 05/26/15 1924     Chief Complaint  Patient presents with  . Hematemesis     (Consider location/radiation/quality/duration/timing/severity/associated sxs/prior Treatment) HPI Comments: Patient reports that she has vomited but at least 10 times in the last 3 hours. Patient reports that she went to the bathroom and suddenly felt pain in her abdomen and vomited. She reports that it was bright red blood. She has had multiple episodes since then and each time has seen blood in her vomit. Patient also reports that she feels like her throat is sore, like something is stuck in it. She denies swallowing any foreign bodies. Patient complaining of diffuse abdominal cramping. She has not passed any blood per rectum. She has never had any problems with acid reflux or peptic ulcer disease.   Past Medical History  Diagnosis Date  . Abnormal Pap smear   . UTI (lower urinary tract infection) 01/12/2013  . ADHD (attention deficit hyperactivity disorder)    Past Surgical History  Procedure Laterality Date  . Tooth extraction    . Cesarean section N/A 09/08/2013    Procedure: CESAREAN SECTION;  Surgeon: Tereso Newcomer, MD;  Location: [facility];  Service: Obstetrics;  Laterality: N/A;   Family History  Problem Relation Age of Onset  . Diabetes Maternal Grandmother   . Diabetes Paternal Grandmother    Social History  Substance Use Topics  . Smoking status: Current Every Day Smoker -- 1.00 packs/day    Types: Cigarettes  . Smokeless tobacco: Never Used  . Alcohol Use: No   OB History    Gravida Para Term Preterm AB TAB SAB Ectopic Multiple Living   Review of Systems  Gastrointestinal: Positive for nausea, vomiting and abdominal pain.  All other systems reviewed and are negative.     Allergies  Review of patient's allergies indicates no known allergies.  Home Medications    Prior to Admission medications   Medication Sig Start Date End Date Taking? Authorizing Provider  Acetaminophen-Caff-Pyrilamine (MIDOL COMPLETE) 500-60-15 MG TABS Take 2 tablets by mouth daily as needed (pain).    Historical Provider, MD  cyclobenzaprine (FLEXERIL) 10 MG tablet Take 1 tablet (10 mg total) by mouth 3 (three) times daily as needed for muscle spasms. Patient not taking: Reported on 09/09/2014 08/01/14   [name], MD  desogestrel-ethinyl estradiol (APRI,EMOQUETTE,SOLIA) 0.15-30 MG-MCG tablet Take 1 tablet by mouth daily. 12/26/14   [name], MD  oxyCODONE-acetaminophen (PERCOCET) 5-325 MG per tablet Take 1 tablet by mouth every 4 (four) hours as needed. 04/10/15   Mancel Bale, MD   BP 109/83 mmHg  Pulse 105  Temp(Src) 98.5 F (36.9 C) (Oral)  Resp 16  SpO2 98% Physical Exam  Constitutional: She is oriented to person, place, and time. She appears well-developed and well-nourished. No distress.  HENT:  Head: Normocephalic and atraumatic.  Right Ear: Hearing normal.  Left Ear: Hearing normal.  Nose: Nose normal.  Mouth/Throat: Oropharynx is clear and moist and mucous membranes are normal.  Eyes: Conjunctivae and EOM are normal. Pupils are equal, round, and reactive to light.  Neck: Normal range of motion. Neck supple.  Cardiovascular: Regular rhythm, S1 normal and S2 normal.  Exam reveals no gallop and no friction rub.   No murmur heard. Pulmonary/Chest: Effort normal and breath sounds normal. No  respiratory distress. She exhibits no tenderness.  Abdominal: Soft. Normal appearance and bowel sounds are normal. There is no hepatosplenomegaly. There is generalized tenderness. There is no rebound, no guarding, no tenderness at McBurney's point and negative Murphy's sign. No hernia.  Musculoskeletal: Normal range of motion.  Neurological: She is alert and oriented to person, place, and time. She has normal strength. No cranial nerve deficit or sensory deficit. Coordination  normal. GCS eye subscore is 4. GCS verbal subscore is 5. GCS motor subscore is 6.  Skin: Skin is warm, dry and intact. No rash noted. No cyanosis.  Psychiatric: She has a normal mood and affect. Her speech is normal and behavior is normal. Thought content normal.  Nursing note and vitals reviewed.   ED Course  Procedures (including critical care time) Labs Review Labs Reviewed  COMPREHENSIVE METABOLIC PANEL  CBC  URINALYSIS, ROUTINE W REFLEX [facility]CROSCOPIC ([facility])  OCCULT BLOOD GASTRIC / DUODENUM (SPECIMEN CUP)  POC OCCULT BLOOD, ED    Imaging Review No results found. I have personally reviewed and evaluated these images and lab results as part of my medical decision-making.   EKG Interpretation None      MDM   Final diagnoses:  None   hematemesis  Patient presents to the ER for evaluation of vomiting blood. Patient reports that she has vomited at least 10 times this evening. She reports that she has seen bright red blood each time. No coffee-ground emesis. No rectal bleeding. She is complaining of diffuse abdominal discomfort. Exam was benign. Vital signs are normal. Patient appears well. An NG tube was attempted, but she did not tolerate this. I was therefore unable to tell how much bleeding there was, but as her hemoglobin is normal, it is felt that she is appropriate for outpatient workup by GI. Will initiate Carafate and Protonix.    Gilda Creasehristopher J Pollina, MD 05/26/15 2144

## 2015-05-26 NOTE — ED Notes (Signed)
Attempted to place a NG tube and pt grabbed hand and jerked back refusing to allow tube.

## 2015-06-18 ENCOUNTER — Emergency Department
Admission: EM | Admit: 2015-06-18 | Discharge: 2015-06-18 | Disposition: A | Discharge status: Home / Self Care | Payer: Medicaid Other | Attending: Emergency Medicine | Admitting: Emergency Medicine

## 2015-06-18 DIAGNOSIS — Z720 Tobacco use: Secondary | ICD-10-CM | POA: Insufficient documentation

## 2015-06-18 DIAGNOSIS — Z79899 Other long term (current) drug therapy: Secondary | ICD-10-CM | POA: Insufficient documentation

## 2015-06-18 DIAGNOSIS — G43809 Other migraine, not intractable, without status migrainosus: Secondary | ICD-10-CM | POA: Diagnosis not present

## 2015-06-18 DIAGNOSIS — R51 Headache: Secondary | ICD-10-CM | POA: Diagnosis present

## 2015-06-18 MED ORDER — KETOROLAC TROMETHAMINE 30 MG/ML IJ SOLN
INTRAMUSCULAR | Status: AC
  Filled 2015-06-18: qty 1

## 2015-06-18 MED ORDER — HYDROMORPHONE HCL 1 MG/ML IJ SOLN
INTRAMUSCULAR | Status: AC
  Administered 2015-06-18: 1 mg via INTRAMUSCULAR
  Filled 2015-06-18: qty 1

## 2015-06-18 MED ORDER — KETOROLAC TROMETHAMINE 30 MG/ML IJ SOLN: 60.0000 mg | Freq: Once | INTRAMUSCULAR | Status: DC

## 2015-06-18 MED ORDER — HYDROMORPHONE HCL 1 MG/ML IJ SOLN
1.0000 mg | Freq: Once | INTRAMUSCULAR | Status: AC
  Administered 2015-06-18: 1 mg via INTRAMUSCULAR

## 2015-06-18 MED ORDER — PROCHLORPERAZINE EDISYLATE 5 MG/ML IJ SOLN
INTRAMUSCULAR | Status: AC
  Administered 2015-06-18: 5 mg
  Filled 2015-06-18: qty 2

## 2015-06-18 MED ORDER — PROCHLORPERAZINE MALEATE 10 MG PO TABS: 10.0000 mg | ORAL_TABLET | Freq: Four times a day (QID) | ORAL | Status: DC | PRN

## 2015-06-18 MED ORDER — KETOROLAC TROMETHAMINE 10 MG PO TABS: 10.0000 mg | ORAL_TABLET | Freq: Three times a day (TID) | ORAL | Status: DC | PRN

## 2015-06-18 MED ORDER — PROCHLORPERAZINE MALEATE 10 MG PO TABS
10.0000 mg | ORAL_TABLET | Freq: Once | ORAL | Status: AC
Start: 2015-06-18 — End: 2015-06-18
  Administered 2015-06-18: 10 mg via ORAL
  Filled 2015-06-18: qty 1

## 2015-06-18 NOTE — ED Notes (Signed)
Headache X1 week, photosensitivity and noise sensitivity. Nausea, denies vomiting. Pressure to both sides of head. Pt alert and oriented X4, active, cooperative, pt in NAD. RR even and unlabored, color WNL.

## 2015-06-18 NOTE — ED Notes (Signed)
Pt reports headache x 1 week; throbbing sensation throughout entire head.  Pt reports light sensitivity, nausea, unable to take care of [age] year old today.  Pt A/Ox4, no immediate signs of distress at this time.

## 2015-06-18 NOTE — ED Notes (Signed)
Patient discharged to home per MD order. Patient in stable condition, and deemed medically cleared by ED provider for discharge. Discharge instructions reviewed with patient/family using "Teach Back"; verbalized understanding of medication education and administration, and information about follow-up care. Denies further concerns. ° °
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
## 2015-06-18 NOTE — Discharge Instructions (Signed)
Please drink plenty of fluid and get plenty of rest to prevent migraines. If you do begin to develop a headache, it is important to take her medications early in the onset of symptoms as headaches or easier to treat early on.  Please return to the emergency department if you develop severe pain, vomiting, fever, or any other symptoms concerning to you.  Migraine Headache A migraine headache is an intense, throbbing pain on one or both sides of your head. A migraine can last for 30 minutes to several hours. CAUSES  The exact cause of a migraine headache is not always known. However, a migraine may be caused when nerves in the brain become irritated and release chemicals that cause inflammation. This causes pain. Certain things may also trigger migraines, such as:  Alcohol.  Smoking.  Stress.  Menstruation.  Aged cheeses.  Foods or drinks that contain nitrates, glutamate, aspartame, or tyramine.  Lack of sleep.  Chocolate.  Caffeine.  Hunger.  Physical exertion.  Fatigue.  Medicines used to treat chest pain (nitroglycerine), birth control pills, estrogen, and some blood pressure medicines. SIGNS AND SYMPTOMS  Pain on one or both sides of your head.  Pulsating or throbbing pain.  Severe pain that prevents daily activities.  Pain that is aggravated by any physical activity.  Nausea, vomiting, or both.  Dizziness.  Pain with exposure to bright lights, loud noises, or activity.  General sensitivity to bright lights, loud noises, or smells. Before you get a migraine, you may get warning signs that a migraine is coming (aura). An aura may include:  Seeing flashing lights.  Seeing bright spots, halos, or zigzag lines.  Having tunnel vision or blurred vision.  Having feelings of numbness or tingling.  Having trouble talking.  Having muscle weakness. DIAGNOSIS  A migraine headache is often diagnosed based on:  Symptoms.  Physical exam.  A CT scan or MRI of  your head. These imaging tests cannot diagnose migraines, but they can help rule out other causes of headaches. TREATMENT Medicines may be given for pain and nausea. Medicines can also be given to help prevent recurrent migraines.  HOME CARE INSTRUCTIONS  Only take over-the-counter or prescription medicines for pain or discomfort as directed by your health care provider. The use of long-term narcotics is not recommended.  Lie down in a dark, quiet room when you have a migraine.  Keep a journal to find out what may trigger your migraine headaches. For example, write down:  What you eat and drink.  How much sleep you get.  Any change to your diet or medicines.  Limit alcohol consumption.  Quit smoking if you smoke.  Get 7-9 hours of sleep, or as recommended by your health care provider.  Limit stress.  Keep lights dim if bright lights bother you and make your migraines worse. SEEK IMMEDIATE MEDICAL CARE IF:   Your migraine becomes severe.  You have a fever.  You have a stiff neck.  You have vision loss.  You have muscular weakness or loss of muscle control.  You start losing your balance or have trouble walking.  You feel faint or pass out.  You have severe symptoms that are different from your first symptoms. MAKE SURE YOU:   Understand these instructions.  Will watch your condition.  Will get help right away if you are not doing well or get worse.   This information is not intended to replace advice given to you by your health care provider. Make sure  you discuss any questions you have with your health care provider. °  °Document Released: 07/28/2005 Document Revised: 08/18/2014 Document Reviewed: 04/04/2013 °Elsevier Interactive Patient Education ©2016 [employer] ° °

## 2015-07-24 ENCOUNTER — Emergency Department (HOSPITAL_COMMUNITY)
Admission: EM | Admit: 2015-07-24 | Discharge: 2015-07-24 | Discharge status: Against Medical Advice | Payer: Medicaid Other | Attending: Emergency Medicine | Admitting: Emergency Medicine

## 2015-07-24 ENCOUNTER — Encounter (HOSPITAL_COMMUNITY): Payer: Self-pay | Admitting: Emergency Medicine

## 2015-07-24 DIAGNOSIS — R202 Paresthesia of skin: Secondary | ICD-10-CM | POA: Diagnosis not present

## 2015-07-24 DIAGNOSIS — Z8659 Personal history of other mental and behavioral disorders: Secondary | ICD-10-CM | POA: Insufficient documentation

## 2015-07-24 DIAGNOSIS — Z8679 Personal history of other diseases of the circulatory system: Secondary | ICD-10-CM | POA: Diagnosis not present

## 2015-07-24 DIAGNOSIS — G8929 Other chronic pain: Secondary | ICD-10-CM | POA: Insufficient documentation

## 2015-07-24 DIAGNOSIS — F17210 Nicotine dependence, cigarettes, uncomplicated: Secondary | ICD-10-CM | POA: Insufficient documentation

## 2015-07-24 DIAGNOSIS — R197 Diarrhea, unspecified: Secondary | ICD-10-CM | POA: Diagnosis not present

## 2015-07-24 DIAGNOSIS — Z79899 Other long term (current) drug therapy: Secondary | ICD-10-CM | POA: Insufficient documentation

## 2015-07-24 DIAGNOSIS — Z793 Long term (current) use of hormonal contraceptives: Secondary | ICD-10-CM | POA: Insufficient documentation

## 2015-07-24 DIAGNOSIS — E860 Dehydration: Secondary | ICD-10-CM | POA: Insufficient documentation

## 2015-07-24 DIAGNOSIS — R112 Nausea with vomiting, unspecified: Secondary | ICD-10-CM | POA: Insufficient documentation

## 2015-07-24 DIAGNOSIS — R519 Headache, unspecified: Secondary | ICD-10-CM

## 2015-07-24 DIAGNOSIS — R51 Headache: Secondary | ICD-10-CM | POA: Diagnosis present

## 2015-07-24 DIAGNOSIS — Z87440 Personal history of urinary (tract) infections: Secondary | ICD-10-CM | POA: Insufficient documentation

## 2015-07-24 HISTORY — DX: Other chronic pain: G89.29

## 2015-07-24 HISTORY — DX: Dorsalgia, unspecified: M54.9

## 2015-07-24 HISTORY — DX: Migraine, unspecified, not intractable, without status migrainosus: G43.909

## 2015-07-24 MED ORDER — SODIUM CHLORIDE 0.9 % IV BOLUS (SEPSIS)
1000.0000 mL | Freq: Once | INTRAVENOUS | Status: AC
  Administered 2015-07-24: 1000 mL via INTRAVENOUS

## 2015-07-24 MED ORDER — DIPHENHYDRAMINE HCL 50 MG/ML IJ SOLN
25.0000 mg | Freq: Once | INTRAMUSCULAR | Status: AC
  Administered 2015-07-24: 25 mg via INTRAVENOUS
  Filled 2015-07-24: qty 1

## 2015-07-24 MED ORDER — KETOROLAC TROMETHAMINE 30 MG/ML IJ SOLN
30.0000 mg | Freq: Once | INTRAMUSCULAR | Status: AC
Start: 2015-07-24 — End: 2015-07-24
  Administered 2015-07-24: 30 mg via INTRAVENOUS
  Filled 2015-07-24: qty 1

## 2015-07-24 MED ORDER — METOCLOPRAMIDE HCL 5 MG/ML IJ SOLN
10.0000 mg | Freq: Once | INTRAMUSCULAR | Status: AC
  Administered 2015-07-24: 10 mg via INTRAVENOUS
  Filled 2015-07-24: qty 2

## 2015-07-24 NOTE — ED Notes (Signed)
MD at bedside. 

## 2015-07-24 NOTE — ED Notes (Signed)
Pt states that she has had a headache for over a week with nausea/vomiting and tingling all over at times.

## 2015-07-24 NOTE — ED Provider Notes (Signed)
CSN: [id number]     Arrival date & time 07/24/15  1230 History   First MD Initiated Contact with Patient 07/24/15 1235     Chief Complaint  Patient presents with  . Migraine     (Consider location/radiation/quality/duration/timing/severity/associated sxs/prior Treatment) HPI..... Generalized headache for several days with associated nausea, vomiting, diarrhea. Patient has taken multiple over-the-counter medications including naproxen, Tylenol, Aleve, along with prescription tramadol with minimal relief. She also complains of tingling in her extremities. She feels slightly dehydrated. No stiff neck, fever, chills, neurological deficits.  Past Medical History  Diagnosis Date  . Abnormal Pap smear   . UTI (lower urinary tract infection) 01/12/2013  . ADHD (attention deficit hyperactivity disorder)   . Chronic back pain   . Migraine headache    Past Surgical History  Procedure Laterality Date  . Tooth extraction    . Cesarean section N/A 09/08/2013    Procedure: CESAREAN SECTION;  Surgeon: Tereso Newcomer, MD;  Location: [facility];  Service: Obstetrics;  Laterality: N/A;   Family History  Problem Relation Age of Onset  . Diabetes Maternal Grandmother   . Diabetes Paternal Grandmother    Social History  Substance Use Topics  . Smoking status: Current Every Day Smoker -- 1.00 packs/day    Types: Cigarettes  . Smokeless tobacco: Never Used  . Alcohol Use: No   OB History    Gravida Para Term Preterm AB TAB SAB Ectopic Multiple Living   Review of Systems  All other systems reviewed and are negative.     Allergies  Review of patient's allergies indicates no known allergies.  Home Medications   Prior to Admission medications   Medication Sig Start Date End Date Taking? Authorizing Provider  Acetaminophen-Caff-Pyrilamine (MIDOL COMPLETE) 500-60-15 MG TABS Take 2 tablets by mouth daily as needed (pain).    Historical Provider, MD  cyclobenzaprine (FLEXERIL)  10 MG tablet Take 1 tablet (10 mg total) by mouth 3 (three) times daily as needed for muscle spasms. Patient not taking: Reported on 09/09/2014 08/01/14   [name], MD  desogestrel-ethinyl estradiol (APRI,EMOQUETTE,SOLIA) 0.15-30 MG-MCG tablet Take 1 tablet by mouth daily. 12/26/14   [name], MD  ketorolac (TORADOL) 10 MG tablet Take 1 tablet (10 mg total) by mouth every 8 (eight) hours as needed for moderate pain (with food). 06/18/15   Rockne Menghini, MD  oxyCODONE-acetaminophen (PERCOCET) 5-325 MG per tablet Take 1 tablet by mouth every 4 (four) hours as needed. 04/10/15   Mancel Bale, MD  pantoprazole (PROTONIX) 20 MG tablet Take 1 tablet (20 mg total) by mouth daily. 05/26/15   [name], MD  prochlorperazine (COMPAZINE) 10 MG tablet Take 1 tablet (10 mg total) by mouth every 6 (six) hours as needed for nausea. 06/18/15 06/17/16  [name], MD  promethazine (PHENERGAN) 25 MG tablet Take 1 tablet (25 mg total) by mouth every 6 (six) hours as needed for nausea or vomiting. 05/26/15   [name], MD  sucralfate (CARAFATE) 1 GM/10ML suspension Take 10 mLs (1 g total) by mouth 4 (four) times daily -  with meals and at bedtime. 05/26/15   [name], MD  traMADol (ULTRAM) 50 MG tablet Take 1 tablet (50 mg total) by mouth every 6 (six) hours as needed. 05/26/15   [name], MD   BP 150/102 mmHg  Pulse 106  Temp(Src) 97.8 F (36.6 C) (Oral)  Resp  18  Ht 5\' 2"  (1.575 m)  Wt 133 lb (60.328 kg)  BMI 24.32 kg/m2  SpO2 100%  LMP 07/08/2015 Physical Exam  Constitutional: She is oriented to person, place, and time.  Slightly dehydrated  HENT:  Head: Normocephalic and atraumatic.  Eyes: Conjunctivae and EOM are normal. Pupils are equal, round, and reactive to light.  Neck: Normal range of motion. Neck supple.  Cardiovascular: Normal rate and regular rhythm.   Pulmonary/Chest: Effort normal and breath sounds normal.  Abdominal: Soft.  Bowel sounds are normal.  Musculoskeletal: Normal range of motion.  Neurological: She is alert and oriented to person, place, and time.  Skin: Skin is warm and dry.  Psychiatric: She has a normal mood and affect. Her behavior is normal.  Nursing note and vitals reviewed.   ED Course  Procedures (including critical care time) Labs Review Labs Reviewed - No data to display  Imaging Review No results found. I have personally reviewed and evaluated these images and lab results as part of my medical decision-making.   EKG Interpretation None      MDM   Final diagnoses:  Intractable headache, unspecified chronicity pattern, unspecified headache type    Patient left AMA after her medications were administered and the first liter of fluid given. She stated her headache was at a "0".  No neurological deficits.    Donnetta HutchingBrian Absalom Aro, MD 07/24/15 [phone]

## 2015-07-24 NOTE — ED Notes (Addendum)
Pt verbalizes that she is ready to leave and doesn't want to stay. MD made aware.   Pt took out IV on her own. Nurse verified that it was taken out.   Pt ambulatory when leaving. NAD noted.

## 2015-07-29 ENCOUNTER — Emergency Department (HOSPITAL_COMMUNITY): Payer: Medicaid Other

## 2015-07-29 ENCOUNTER — Emergency Department (HOSPITAL_COMMUNITY)
Admission: EM | Admit: 2015-07-29 | Discharge: 2015-07-29 | Disposition: A | Discharge status: Home / Self Care | Payer: Medicaid Other | Attending: Emergency Medicine | Admitting: Emergency Medicine

## 2015-07-29 ENCOUNTER — Encounter (HOSPITAL_COMMUNITY): Payer: Self-pay

## 2015-07-29 DIAGNOSIS — Z8659 Personal history of other mental and behavioral disorders: Secondary | ICD-10-CM | POA: Insufficient documentation

## 2015-07-29 DIAGNOSIS — Z23 Encounter for immunization: Secondary | ICD-10-CM | POA: Insufficient documentation

## 2015-07-29 DIAGNOSIS — Y9289 Other specified places as the place of occurrence of the external cause: Secondary | ICD-10-CM | POA: Insufficient documentation

## 2015-07-29 DIAGNOSIS — Z8679 Personal history of other diseases of the circulatory system: Secondary | ICD-10-CM | POA: Diagnosis not present

## 2015-07-29 DIAGNOSIS — W268XXA Contact with other sharp object(s), not elsewhere classified, initial encounter: Secondary | ICD-10-CM | POA: Diagnosis not present

## 2015-07-29 DIAGNOSIS — S61012A Laceration without foreign body of left thumb without damage to nail, initial encounter: Secondary | ICD-10-CM

## 2015-07-29 DIAGNOSIS — Y998 Other external cause status: Secondary | ICD-10-CM | POA: Diagnosis not present

## 2015-07-29 DIAGNOSIS — Z87440 Personal history of urinary (tract) infections: Secondary | ICD-10-CM | POA: Diagnosis not present

## 2015-07-29 DIAGNOSIS — F17210 Nicotine dependence, cigarettes, uncomplicated: Secondary | ICD-10-CM | POA: Diagnosis not present

## 2015-07-29 DIAGNOSIS — G8929 Other chronic pain: Secondary | ICD-10-CM | POA: Diagnosis not present

## 2015-07-29 DIAGNOSIS — Z79899 Other long term (current) drug therapy: Secondary | ICD-10-CM | POA: Insufficient documentation

## 2015-07-29 DIAGNOSIS — Y9389 Activity, other specified: Secondary | ICD-10-CM | POA: Insufficient documentation

## 2015-07-29 MED ORDER — DOUBLE ANTIBIOTIC 500-10000 UNIT/GM EX OINT
TOPICAL_OINTMENT | Freq: Once | CUTANEOUS | Status: AC
Start: 2015-07-29 — End: 2015-07-29
  Administered 2015-07-29: 19:00:00 via TOPICAL
  Filled 2015-07-29: qty 1

## 2015-07-29 MED ORDER — LIDOCAINE HCL (PF) 2 % IJ SOLN
10.0000 mL | Freq: Once | INTRAMUSCULAR | Status: AC
  Administered 2015-07-29: 10 mL
  Filled 2015-07-29: qty 10

## 2015-07-29 MED ORDER — OXYCODONE-ACETAMINOPHEN 5-325 MG PO TABS
1.0000 | ORAL_TABLET | Freq: Once | ORAL | Status: AC
  Administered 2015-07-29: 1 via ORAL
  Filled 2015-07-29: qty 1

## 2015-07-29 MED ORDER — POVIDONE-IODINE 10 % EX SOLN
CUTANEOUS | Status: AC
  Filled 2015-07-29: qty 118

## 2015-07-29 MED ORDER — CEPHALEXIN 500 MG PO CAPS: 500.0000 mg | ORAL_CAPSULE | Freq: Four times a day (QID) | ORAL | Status: DC

## 2015-07-29 MED ORDER — CEPHALEXIN 500 MG PO CAPS
500.0000 mg | ORAL_CAPSULE | Freq: Once | ORAL | Status: AC
  Administered 2015-07-29: 500 mg via ORAL
  Filled 2015-07-29: qty 1

## 2015-07-29 MED ORDER — TETANUS-DIPHTH-ACELL PERTUSSIS 5-2.5-18.5 LF-MCG/0.5 IM SUSP
0.5000 mL | Freq: Once | INTRAMUSCULAR | Status: AC
  Administered 2015-07-29: 0.5 mL via INTRAMUSCULAR
  Filled 2015-07-29: qty 0.5

## 2015-07-29 MED ORDER — OXYCODONE-ACETAMINOPHEN 5-325 MG PO TABS: 1.0000 | ORAL_TABLET | ORAL | Status: DC | PRN

## 2015-07-29 NOTE — ED Notes (Signed)
Instructed pt to take all of antibiotics as prescribed.Pt verbalized understanding of no driving and to use caution within 4 hours of taking pain meds due to meds cause drowsiness 

## 2015-07-29 NOTE — ED Notes (Signed)
Cut right thumb on can approx. 20 minutes ago. Unknown last tetanus shot.

## 2015-07-29 NOTE — ED Provider Notes (Signed)
CSN: [id number]     Arrival date & time 07/29/15  1646 History  By signing my name below, I, Soijett Blue, attest that this documentation has been prepared under the direction and in the presence of Pauline Ausammy Huck Ashworth, PA-C Electronically Signed: Soijett Blue, ED Scribe. 07/29/2015. 5:05 PM.   Chief Complaint  Patient presents with  . Extremity Laceration      The history is provided by the patient. No language interpreter was used.    Jessica [name] is a 24 y.o. female who presents to the Emergency Department complaining of right thumb laceration onset 20 minutes PTA. She reports that she cut her right thumb on a can while trying to take the lid off after opening it with a manual can opener. She is unsure of the status of her tetanus at this time. She states that she is having associated symptoms of numbness and tingling to the tip of her right thumb. She states that she has tried wrapping the area with no relief for her symptoms. She denies color change, swelling, weakness or difficulty moving her finger.  Denies blood thinners at this time.   Past Medical History  Diagnosis Date  . Abnormal Pap smear   . UTI (lower urinary tract infection) 01/12/2013  . ADHD (attention deficit hyperactivity disorder)   . Chronic back pain   . Migraine headache    Past Surgical History  Procedure Laterality Date  . Tooth extraction    . Cesarean section N/A 09/08/2013    Procedure: CESAREAN SECTION;  Surgeon: Tereso NewcomerUgonna A Anyanwu, MD;  Location: [facility];  Service: Obstetrics;  Laterality: N/A;   Family History  Problem Relation Age of Onset  . Diabetes Maternal Grandmother   . Diabetes Paternal Grandmother    Social History  Substance Use Topics  . Smoking status: Current Every Day Smoker -- 1.00 packs/day    Types: Cigarettes  . Smokeless tobacco: Never Used  . Alcohol Use: No   OB History    Gravida Para Term Preterm AB TAB SAB Ectopic Multiple Living   1 1 1       1      Review of Systems   Musculoskeletal: Negative for joint swelling and arthralgias.  Skin: Positive for wound (laceration to right thumb). Negative for color change.  Neurological: Positive for numbness (numbness and tingling to tip of left thumb). Negative for dizziness.  All other systems reviewed and are negative.   Allergies  Review of patient's allergies indicates no known allergies.  Home Medications   Prior to Admission medications   Medication Sig Start Date End Date Taking? Authorizing Provider  desogestrel-ethinyl estradiol (APRI,EMOQUETTE,SOLIA) 0.15-30 MG-MCG tablet Take 1 tablet by mouth daily. 12/26/14  Yes Lazaro ArmsLuther H Eure, MD  Acetaminophen-Caff-Pyrilamine (MIDOL COMPLETE) 500-60-15 MG TABS Take 2 tablets by mouth daily as needed (pain).    Historical Provider, MD  cyclobenzaprine (FLEXERIL) 10 MG tablet Take 1 tablet (10 mg total) by mouth 3 (three) times daily as needed for muscle spasms. Patient not taking: Reported on 09/09/2014 08/01/14   Devoria AlbeIva Knapp, MD  ketorolac (TORADOL) 10 MG tablet Take 1 tablet (10 mg total) by mouth every 8 (eight) hours as needed for moderate pain (with food). 06/18/15   Rockne MenghiniAnne-Caroline Norman, MD  oxyCODONE-acetaminophen (PERCOCET) 5-325 MG per tablet Take 1 tablet by mouth every 4 (four) hours as needed. 04/10/15   Mancel BaleElliott Wentz, MD  pantoprazole (PROTONIX) 20 MG tablet Take 1 tablet (20 mg total) by mouth daily. 05/26/15  [name], MD  prochlorperazine (COMPAZINE) 10 MG tablet Take 1 tablet (10 mg total) by mouth every 6 (six) hours as needed for nausea. 06/18/15 06/17/16  [name], MD  promethazine (PHENERGAN) 25 MG tablet Take 1 tablet (25 mg total) by mouth every 6 (six) hours as needed for nausea or vomiting. 05/26/15   [name], MD  sucralfate (CARAFATE) 1 GM/10ML suspension Take 10 mLs (1 g total) by mouth 4 (four) times daily -  with meals and at bedtime. 05/26/15   [name], MD   BP 142/91 mmHg  Pulse 102   Temp(Src) 97.9 F (36.6 C) (Oral)  Resp 18  Ht  (1.575 m)  Wt 133 lb (60.328 kg)  BMI 24.32 kg/m2  SpO2 100%  LMP 07/08/2015 Physical Exam  Constitutional: She is oriented to person, place, and time. She appears well-developed and well-nourished. No distress.  HENT:  Head: Normocephalic and atraumatic.  Eyes: EOM are normal.  Neck: Neck supple.  Cardiovascular: Normal rate and intact distal pulses.   Pulmonary/Chest: Effort normal. No respiratory distress.  Musculoskeletal: Normal range of motion. She exhibits tenderness.  Neurological: She is alert and oriented to person, place, and time.  Skin: Skin is warm and dry. Laceration noted.  Extensive laceration to the distal tip of left thumb. Bleeding controlled after direct pressure. Gross distal sensation intact. Pain through ROM of the DIP , CR< 2 sec.    Psychiatric: She has a normal mood and affect.  Nursing note and vitals reviewed.   ED Course  Procedures (including critical care time) DIAGNOSTIC STUDIES: Oxygen Saturation is 100% on RA, nl by my interpretation.    COORDINATION OF CARE: 5:04 PM Discussed treatment plan with pt at bedside which includes update tetanus, right thumb xray, and pt agreed to plan.  6:36 PM- Pt agrees to arrange f/u with orthopedist tomorrow and will return to the ED on Tuesday 07/31/2015 for recheck.    LACERATION REPAIR PROCEDURE NOTE The patient's identification was confirmed and consent was obtained. This procedure was performed by Pauline Aus, PA-C at 5:49 PM. Site: distal tip of left thumb Sterile procedures observed: YES Anesthetic used (type and amt): 2 % Lidocaine without Epinephrine and 3 ml used, digital block Suture type/size:4-0 Prolene Length: 2.5 cm # of Sutures: 9 Technique:Single interrupted, loosely approximated Complexity: complex Antibx ointment applied: Bacitracin  Tetanus UTD or ordered: YES Site anesthetized, thoroughly irrigated with NS, explored without  evidence of foreign body, wound well approximated, site covered with dry, sterile dressing.  Patient tolerated procedure well without complications. Instructions for care discussed verbally and patient provided with additional written instructions for homecare and f/u.   Labs Review Labs Reviewed - No data to display  Imaging Review Dg Finger Thumb Left  07/29/2015  CLINICAL DATA:  Cut right thumb approximately 20 minutes ago. EXAM: LEFT THUMB 2+V COMPARISON:  None. FINDINGS: There is no evidence of fracture or dislocation. There is no evidence of arthropathy or other focal bone abnormality. Soft tissue laceration overlying the volar aspect of the distal phalanx noted. IMPRESSION: 1. Soft tissue laceration. 2. No acute bone abnormality. Electronically Signed   By: Signa Kell M.D.   On: 07/29/2015 17:35   I have personally reviewed and evaluated these images as part of my medical decision-making.    MDM   Final diagnoses:  Laceration of thumb, left, initial encounter    Extensive lac to the distal tip left thumb.  No FB's seen or obvious  tendon injury, but concerning for possible nerve injury.  I have discussed this with the patient and expressed importance of close f/u with orthopedics or hand surgeon.  She verbalized understanding and agrees to arrange f/u tomorrow and I will prescribe abx and have pt return here in 2 days for recheck.    Pt bandaged and finger splint in extension.  Tendon NT.   I personally performed the services described in this documentation, which was scribed in my presence. The recorded information has been reviewed and is accurate.    Pauline Aus, PA-C 08/01/15 1808  Samuel Jester, DO 08/01/15 [id number]

## 2015-07-30 ENCOUNTER — Telehealth: Payer: Self-pay | Admitting: Orthopedic Surgery

## 2015-07-30 NOTE — Telephone Encounter (Signed)
Call received from patient following Jessica [name] Emergency Room visit 07/29/15 for problem of finger laceration; states received stitches and is to follow back up at Emergency Room, as well as referral appointment to orthopedics (Dr Romeo AppleHarrison was not actually the orthopedic specialist on call).  Relayed that, due to insurance of [place] [employer], she will need to request referral from primary care, [facility].  Relayed they may elect to refer her directly to an orthopedic hand specialist, which I relayed, may be best to be directly referred there.  States she will contact PCP office.

## 2015-07-31 ENCOUNTER — Emergency Department (HOSPITAL_COMMUNITY)
Admission: EM | Admit: 2015-07-31 | Discharge: 2015-07-31 | Disposition: A | Discharge status: Home / Self Care | Payer: Medicaid Other | Attending: Emergency Medicine | Admitting: Emergency Medicine

## 2015-07-31 ENCOUNTER — Encounter (HOSPITAL_COMMUNITY): Payer: Self-pay | Admitting: Emergency Medicine

## 2015-07-31 DIAGNOSIS — Z87440 Personal history of urinary (tract) infections: Secondary | ICD-10-CM | POA: Diagnosis not present

## 2015-07-31 DIAGNOSIS — Z8659 Personal history of other mental and behavioral disorders: Secondary | ICD-10-CM | POA: Insufficient documentation

## 2015-07-31 DIAGNOSIS — S61012D Laceration without foreign body of left thumb without damage to nail, subsequent encounter: Secondary | ICD-10-CM | POA: Diagnosis not present

## 2015-07-31 DIAGNOSIS — R200 Anesthesia of skin: Secondary | ICD-10-CM | POA: Diagnosis not present

## 2015-07-31 DIAGNOSIS — Z8679 Personal history of other diseases of the circulatory system: Secondary | ICD-10-CM | POA: Insufficient documentation

## 2015-07-31 DIAGNOSIS — F17210 Nicotine dependence, cigarettes, uncomplicated: Secondary | ICD-10-CM | POA: Diagnosis not present

## 2015-07-31 DIAGNOSIS — G8929 Other chronic pain: Secondary | ICD-10-CM | POA: Diagnosis not present

## 2015-07-31 DIAGNOSIS — R202 Paresthesia of skin: Secondary | ICD-10-CM | POA: Insufficient documentation

## 2015-07-31 DIAGNOSIS — W268XXD Contact with other sharp object(s), not elsewhere classified, subsequent encounter: Secondary | ICD-10-CM | POA: Insufficient documentation

## 2015-07-31 DIAGNOSIS — Z4801 Encounter for change or removal of surgical wound dressing: Secondary | ICD-10-CM | POA: Diagnosis present

## 2015-07-31 MED ORDER — OXYCODONE-ACETAMINOPHEN 5-325 MG PO TABS
1.0000 | ORAL_TABLET | Freq: Once | ORAL | Status: AC
Start: 2015-07-31 — End: 2015-07-31
  Administered 2015-07-31: 1 via ORAL
  Filled 2015-07-31: qty 1

## 2015-07-31 MED ORDER — IBUPROFEN 800 MG PO TABS: 800.0000 mg | ORAL_TABLET | Freq: Three times a day (TID) | ORAL | Status: DC

## 2015-07-31 NOTE — ED Notes (Signed)
Pt here for recheck of L thumb.

## 2015-07-31 NOTE — ED Provider Notes (Signed)
CSN: [id number]     Arrival date & time 07/31/15  1705 History   First MD Initiated Contact with Patient 07/31/15 1727     Chief Complaint  Patient presents with  . Wound Check     (Consider location/radiation/quality/duration/timing/severity/associated sxs/prior Treatment) HPI   Jessica [name] is a 24 y.o. female who presents to the Emergency Department for recheck of laceration to the distal left thumb.  Patient was seen by me two days ago for a laceration to her thumb that occurred while attempting to open a can with a manual can opener.  She reports continued pain to her thumb with numbness and tingling to her thumb.  She states that she called the orthopedic surgeon's office and was told that she will referral from her PCP.  States she is trying to change her primary doctor and cannot be seen until January.  She is currently taking antibiotic as directed.  She denies fever, chills, swelling, drainage or redness of the digit.   Past Medical History  Diagnosis Date  . Abnormal Pap smear   . UTI (lower urinary tract infection) 01/12/2013  . ADHD (attention deficit hyperactivity disorder)   . Chronic back pain   . Migraine headache    Past Surgical History  Procedure Laterality Date  . Tooth extraction    . Cesarean section N/A 09/08/2013    Procedure: CESAREAN SECTION;  Surgeon: Tereso NewcomerUgonna A Anyanwu, MD;  Location: [facility];  Service: Obstetrics;  Laterality: N/A;   Family History  Problem Relation Age of Onset  . Diabetes Maternal Grandmother   . Diabetes Paternal Grandmother    Social History  Substance Use Topics  . Smoking status: Current Every Day Smoker -- 1.00 packs/day    Types: Cigarettes  . Smokeless tobacco: Never Used  . Alcohol Use: No   OB History    Gravida Para Term Preterm AB TAB SAB Ectopic Multiple Living   1 1 1       1      Review of Systems  Constitutional: Negative for fever and chills.  Musculoskeletal: Negative for back pain, joint swelling and  arthralgias.  Skin: Positive for wound.       Laceration left thumb  Neurological: Negative for dizziness, weakness and numbness (numbness and tingling to distal left thumb).  Hematological: Does not bruise/bleed easily.  All other systems reviewed and are negative.     Allergies  Review of patient's allergies indicates no known allergies.  Home Medications   Prior to Admission medications   Medication Sig Start Date End Date Taking? Authorizing Provider  Acetaminophen-Caff-Pyrilamine (MIDOL COMPLETE) 500-60-15 MG TABS Take 2 tablets by mouth daily as needed (pain).    Historical Provider, MD  cephALEXin (KEFLEX) 500 MG capsule Take 1 capsule (500 mg total) by mouth 4 (four) times daily. For 7 days 07/29/15   Alisse Tuite, PA-C  cyclobenzaprine (FLEXERIL) 10 MG tablet Take 1 tablet (10 mg total) by mouth 3 (three) times daily as needed for muscle spasms. Patient not taking: Reported on 09/09/2014 08/01/14   Devoria AlbeIva Knapp, MD  desogestrel-ethinyl estradiol (APRI,EMOQUETTE,SOLIA) 0.15-30 MG-MCG tablet Take 1 tablet by mouth daily. 12/26/14   Lazaro ArmsLuther H Eure, MD  ketorolac (TORADOL) 10 MG tablet Take 1 tablet (10 mg total) by mouth every 8 (eight) hours as needed for moderate pain (with food). 06/18/15   Rockne MenghiniAnne-Caroline Norman, MD  oxyCODONE-acetaminophen (PERCOCET/ROXICET) 5-325 MG tablet Take 1 tablet by mouth every 4 (four) hours as needed. 07/29/15   Ramone Gander,  PA-C  pantoprazole (PROTONIX) 20 MG tablet Take 1 tablet (20 mg total) by mouth daily. 05/26/15   [name], MD  prochlorperazine (COMPAZINE) 10 MG tablet Take 1 tablet (10 mg total) by mouth every 6 (six) hours as needed for nausea. 06/18/15 06/17/16  [name], MD  promethazine (PHENERGAN) 25 MG tablet Take 1 tablet (25 mg total) by mouth every 6 (six) hours as needed for nausea or vomiting. 05/26/15   [name], MD  sucralfate (CARAFATE) 1 GM/10ML suspension Take 10 mLs (1 g total) by mouth 4 (four)  times daily -  with meals and at bedtime. 05/26/15   [name], MD   BP 136/81 mmHg  Pulse 85  Temp(Src) 98 F (36.7 C) (Oral)  Resp 16  Ht  (1.575 m)  Wt 60.328 kg  BMI 24.32 kg/m2  SpO2 100%  LMP 07/08/2015 Physical Exam  Constitutional: She is oriented to person, place, and time. She appears well-developed and well-nourished. No distress.  HENT:  Head: Normocephalic and atraumatic.  Cardiovascular: Normal rate, regular rhythm and intact distal pulses.   Pulmonary/Chest: Effort normal. No respiratory distress.  Musculoskeletal: She exhibits tenderness. She exhibits no edema.  Gross sensation to distal left thumb intact.  CR< 2 sec, pain with ROM of the DIP.  No tendon tenderness, drainage or edema. Wound clean, no clinical signs of infection. Sutures intact  Neurological: She is alert and oriented to person, place, and time. She exhibits normal muscle tone. Coordination normal.  Skin: Skin is warm.  Nursing note and vitals reviewed.   ED Course  Procedures (including critical care time) Labs Review Labs Reviewed - No data to display  Imaging Review No results found. I have personally reviewed and evaluated these images and lab results as part of my medical decision-making.    MDM   Final diagnoses:  Laceration of thumb, left, subsequent encounter   Wound was loosely approximated due to tension of the skin.  No signs of infection at present.    Pt also seen by Dr. Patria Mane, care plan discussed.   Pt currently taking antibiotic.  Tried to f/u with orthopedics and was told she needs referral from PCP to see a specialist.  Told she cannot be seen until after January.  No concerning sx's for infection at present.  Pt agrees to continue wound care, finger splinting, and I will advise to return here in another two days for recheck.  Pt agrees to plan and verbalized understanding.    Pauline Aus, PA-C 08/01/15 2321  Azalia Bilis, MD 08/01/15 [phone]

## 2015-08-02 ENCOUNTER — Emergency Department (HOSPITAL_COMMUNITY)
Admission: EM | Admit: 2015-08-02 | Discharge: 2015-08-02 | Disposition: A | Discharge status: Home / Self Care | Payer: Medicaid Other | Attending: Emergency Medicine | Admitting: Emergency Medicine

## 2015-08-02 ENCOUNTER — Encounter (HOSPITAL_COMMUNITY): Payer: Self-pay | Admitting: *Deleted

## 2015-08-02 DIAGNOSIS — Z791 Long term (current) use of non-steroidal anti-inflammatories (NSAID): Secondary | ICD-10-CM | POA: Diagnosis not present

## 2015-08-02 DIAGNOSIS — Z8659 Personal history of other mental and behavioral disorders: Secondary | ICD-10-CM | POA: Insufficient documentation

## 2015-08-02 DIAGNOSIS — Z87440 Personal history of urinary (tract) infections: Secondary | ICD-10-CM | POA: Insufficient documentation

## 2015-08-02 DIAGNOSIS — F17210 Nicotine dependence, cigarettes, uncomplicated: Secondary | ICD-10-CM | POA: Insufficient documentation

## 2015-08-02 DIAGNOSIS — Z79899 Other long term (current) drug therapy: Secondary | ICD-10-CM | POA: Insufficient documentation

## 2015-08-02 DIAGNOSIS — Z5189 Encounter for other specified aftercare: Secondary | ICD-10-CM

## 2015-08-02 DIAGNOSIS — Z793 Long term (current) use of hormonal contraceptives: Secondary | ICD-10-CM | POA: Insufficient documentation

## 2015-08-02 DIAGNOSIS — G8929 Other chronic pain: Secondary | ICD-10-CM | POA: Insufficient documentation

## 2015-08-02 DIAGNOSIS — G43909 Migraine, unspecified, not intractable, without status migrainosus: Secondary | ICD-10-CM | POA: Diagnosis not present

## 2015-08-02 DIAGNOSIS — Z4801 Encounter for change or removal of surgical wound dressing: Secondary | ICD-10-CM | POA: Diagnosis present

## 2015-08-02 MED ORDER — HYDROCODONE-ACETAMINOPHEN 5-325 MG PO TABS
2.0000 | ORAL_TABLET | Freq: Once | ORAL | Status: AC
  Administered 2015-08-02: 2 via ORAL
  Filled 2015-08-02: qty 2

## 2015-08-02 MED ORDER — HYDROCODONE-ACETAMINOPHEN 5-325 MG PO TABS: 1.0000 | ORAL_TABLET | ORAL | Status: DC | PRN

## 2015-08-02 MED ORDER — ONDANSETRON HCL 4 MG PO TABS
4.0000 mg | ORAL_TABLET | Freq: Once | ORAL | Status: AC
  Administered 2015-08-02: 4 mg via ORAL
  Filled 2015-08-02: qty 1

## 2015-08-02 NOTE — ED Provider Notes (Signed)
CSN: [id number]     Arrival date & time 08/02/15  0913 History   First MD Initiated Contact with Patient 08/02/15 1004     Chief Complaint  Patient presents with  . Wound Check     (Consider location/radiation/quality/duration/timing/severity/associated sxs/prior Treatment) HPI Comments: Patient is a 24 year old female who sustained a laceration to the right thumb on December 18. The laceration was approximated loosely. The in the emergency department and patient returns again today for recheck of wound, as there has been difficulty with getting the wound to seal. The patient denies any significant drainage. His been no red streaks noted. No fever reported. There's been no change in the sensory function.  Patient is a 24 y.o. female presenting with wound check. The history is provided by the patient.  Wound Check    Past Medical History  Diagnosis Date  . Abnormal Pap smear   . UTI (lower urinary tract infection) 01/12/2013  . ADHD (attention deficit hyperactivity disorder)   . Chronic back pain   . Migraine headache    Past Surgical History  Procedure Laterality Date  . Tooth extraction    . Cesarean section N/A 09/08/2013    Procedure: CESAREAN SECTION;  Surgeon: Tereso Newcomer, MD;  Location: [facility];  Service: Obstetrics;  Laterality: N/A;   Family History  Problem Relation Age of Onset  . Diabetes Maternal Grandmother   . Diabetes Paternal Grandmother    Social History  Substance Use Topics  . Smoking status: Current Every Day Smoker -- 1.00 packs/day    Types: Cigarettes  . Smokeless tobacco: Never Used  . Alcohol Use: No   OB History    Gravida Para Term Preterm AB TAB SAB Ectopic Multiple Living   Review of Systems  Skin: Positive for wound.  All other systems reviewed and are negative.     Allergies  Review of patient's allergies indicates no known allergies.  Home Medications   Prior to Admission medications   Medication Sig  Start Date End Date Taking? Authorizing Provider  Acetaminophen-Caff-Pyrilamine (MIDOL COMPLETE) 500-60-15 MG TABS Take 2 tablets by mouth daily as needed (pain).    Historical Provider, MD  cephALEXin (KEFLEX) 500 MG capsule Take 1 capsule (500 mg total) by mouth 4 (four) times daily. For 7 days 07/29/15   [name], PA-C  cyclobenzaprine (FLEXERIL) 10 MG tablet Take 1 tablet (10 mg total) by mouth 3 (three) times daily as needed for muscle spasms. Patient not taking: Reported on 09/09/2014 08/01/14   [name], MD  desogestrel-ethinyl estradiol (APRI,EMOQUETTE,SOLIA) 0.15-30 MG-MCG tablet Take 1 tablet by mouth daily. 12/26/14   [name], MD  ibuprofen (ADVIL,MOTRIN) 800 MG tablet Take 1 tablet (800 mg total) by mouth 3 (three) times daily. 07/31/15   [name], PA-C  ketorolac (TORADOL) 10 MG tablet Take 1 tablet (10 mg total) by mouth every 8 (eight) hours as needed for moderate pain (with food). 06/18/15   Rockne Menghini, MD  oxyCODONE-acetaminophen (PERCOCET/ROXICET) 5-325 MG tablet Take 1 tablet by mouth every 4 (four) hours as needed. 07/29/15   [name], PA-C  pantoprazole (PROTONIX) 20 MG tablet Take 1 tablet (20 mg total) by mouth daily. 05/26/15   [name], MD  prochlorperazine (COMPAZINE) 10 MG tablet Take 1 tablet (10 mg total) by mouth every 6 (six) hours as needed for nausea. 06/18/15 06/17/16  Rockne Menghini, MD  promethazine (PHENERGAN) 25  MG tablet Take 1 tablet (25 mg total) by mouth every 6 (six) hours as needed for nausea or vomiting. 05/26/15   Gilda Creasehristopher J Pollina, MD  sucralfate (CARAFATE) 1 GM/10ML suspension Take 10 mLs (1 g total) by mouth 4 (four) times daily -  with meals and at bedtime. 05/26/15   Gilda Creasehristopher J Pollina, MD   BP 126/67 mmHg  Pulse 84  Temp(Src) 98.1 F (36.7 C) (Oral)  Resp 16  Ht 5\' 2"  (1.575 m)  Wt 59.739 kg  BMI 24.08 kg/m2  SpO2 100%  LMP 07/08/2015 Physical Exam  Constitutional: She is oriented to  person, place, and time. She appears well-developed and well-nourished.  Non-toxic appearance.  HENT:  Head: Normocephalic.  Right Ear: Tympanic membrane and external ear normal.  Left Ear: Tympanic membrane and external ear normal.  Eyes: EOM and lids are normal. Pupils are equal, round, and reactive to light.  Neck: Normal range of motion. Neck supple. Carotid bruit is not present.  Cardiovascular: Normal rate, regular rhythm, normal heart sounds, intact distal pulses and normal pulses.   Pulmonary/Chest: Breath sounds normal. No respiratory distress.  Abdominal: Soft. Bowel sounds are normal. There is no tenderness. There is no guarding.  Musculoskeletal: Normal range of motion.  There is a healing laceration of the distal palmar surface of the left thumb. There remains tissue bulging between the laceration site. There is no drainage noted. No red streaks appreciated. There is some new granulation tissue present. There is stiffness of the thumb, but good range of motion. There is full range of motion of the left wrist, elbow, and shoulder.  Lymphadenopathy:       Head (right side): No submandibular adenopathy present.       Head (left side): No submandibular adenopathy present.    She has no cervical adenopathy.  Neurological: She is alert and oriented to person, place, and time. She has normal strength. No cranial nerve deficit or sensory deficit.  Skin: Skin is warm and dry.  Psychiatric: She has a normal mood and affect. Her speech is normal.  Nursing note and vitals reviewed.   ED Course  Procedures (including critical care time) Labs Review Labs Reviewed - No data to display  Imaging Review No results found. I have personally reviewed and evaluated these images and lab results as part of my medical decision-making.   EKG Interpretation None      MDM  The sutured wound present left thumb is progressing nicely, but slowly. There is some granulation tissue present. But the  wound continues to have bulging tissue depression present. The distal portion of the lung remains tender to palpation and movement. Patient treated with oral Norco for assistance with pain. I reminded the patient keep her hand elevated as much as possible. The patient is to return on Monday D7 26 for recheck of her wound, and to see if sutures can be removed safely.    Final diagnoses:  None    **I have reviewed nursing notes, vital signs, and all appropriate lab and imaging results for this patient.[name]*    Kamyia Thomason, PA-C 08/06/15 1705  Loren Raceravid Yelverton, MD 08/15/15 [phone]

## 2015-08-02 NOTE — Discharge Instructions (Signed)
Please return to the ED on Monday 12/26 for recheck of the wound and possible suture removal. Keep wound clean and dry. Elevate your hand as much as possible.

## 2015-08-02 NOTE — ED Notes (Signed)
Pt states that she has not been cleaning her thumb.  States that she thought she was supposed to leave it alone and has not washed her hands since cutting her thumb.

## 2015-08-02 NOTE — ED Notes (Signed)
PA at bedside.

## 2015-08-02 NOTE — ED Notes (Signed)
Here for wound recheck to left thumb. States she noticed green drainage to bandage  Yesterday and states no feeling to her thumb. Pt received 9 sutures to thumb last visit.

## 2015-08-06 ENCOUNTER — Emergency Department (HOSPITAL_COMMUNITY)
Admission: EM | Admit: 2015-08-06 | Discharge: 2015-08-06 | Disposition: A | Discharge status: Home / Self Care | Payer: Medicaid Other | Attending: Emergency Medicine | Admitting: Emergency Medicine

## 2015-08-06 ENCOUNTER — Encounter (HOSPITAL_COMMUNITY): Payer: Self-pay

## 2015-08-06 DIAGNOSIS — G8929 Other chronic pain: Secondary | ICD-10-CM | POA: Insufficient documentation

## 2015-08-06 DIAGNOSIS — Z4802 Encounter for removal of sutures: Secondary | ICD-10-CM

## 2015-08-06 DIAGNOSIS — Z793 Long term (current) use of hormonal contraceptives: Secondary | ICD-10-CM | POA: Diagnosis not present

## 2015-08-06 DIAGNOSIS — Z8679 Personal history of other diseases of the circulatory system: Secondary | ICD-10-CM | POA: Insufficient documentation

## 2015-08-06 DIAGNOSIS — Z87440 Personal history of urinary (tract) infections: Secondary | ICD-10-CM | POA: Insufficient documentation

## 2015-08-06 DIAGNOSIS — Z791 Long term (current) use of non-steroidal anti-inflammatories (NSAID): Secondary | ICD-10-CM | POA: Diagnosis not present

## 2015-08-06 DIAGNOSIS — Z79899 Other long term (current) drug therapy: Secondary | ICD-10-CM | POA: Diagnosis not present

## 2015-08-06 DIAGNOSIS — F17210 Nicotine dependence, cigarettes, uncomplicated: Secondary | ICD-10-CM | POA: Insufficient documentation

## 2015-08-06 DIAGNOSIS — Z8659 Personal history of other mental and behavioral disorders: Secondary | ICD-10-CM | POA: Insufficient documentation

## 2015-08-06 MED ORDER — SULFAMETHOXAZOLE-TRIMETHOPRIM 800-160 MG PO TABS: 1.0000 | ORAL_TABLET | Freq: Two times a day (BID) | ORAL | Status: AC

## 2015-08-06 MED ORDER — NAPROXEN 250 MG PO TABS
500.0000 mg | ORAL_TABLET | Freq: Once | ORAL | Status: AC
  Administered 2015-08-06: 500 mg via ORAL
  Filled 2015-08-06: qty 2

## 2015-08-06 MED ORDER — NAPROXEN 500 MG PO TABS: 500.0000 mg | ORAL_TABLET | Freq: Two times a day (BID) | ORAL | Status: DC

## 2015-08-06 MED ORDER — TRAMADOL HCL 50 MG PO TABS
50.0000 mg | ORAL_TABLET | Freq: Once | ORAL | Status: AC
  Administered 2015-08-06: 50 mg via ORAL
  Filled 2015-08-06: qty 1

## 2015-08-06 NOTE — ED Provider Notes (Signed)
CSN: [id number]     Arrival date & time 08/06/15  1634 History  By signing my name below, I, [facility], attest that this documentation has been prepared under the direction and in the presence of [name], PA-C. Electronically Signed: [name], ED Scribe. 08/06/2015. 1:09 PM.   Chief Complaint  Patient presents with  . Suture / Staple Removal   The history is provided by the patient. No language interpreter was used.   HPI Comments: Jessica [name] is a 24 y.o. female who presents to the Emergency Department for a suture removal. She endorses pain at the laceration. Patient reports that she lacerated her thumb on a metal can lid and was seen in the [facility] ED by Pauline Aus, PA-C and received stitches, was prescribed a course of Keflex, and advised to follow-up with an orthopaedist 8 days ago. Per patient, she has returned to the ED on 2 separate occassions for a wound check, one 6 days ago and one 4 days ago, and told her wound was healing well and to return to the ED to get the sutures removed today. Patient has not followed up with an orthopaedist and has not been seen by her PCP. She states that she finished her course of antibiotics, has been soaking the area in water, and changing the bandage every day.  She has persistent pain at the wound site.  PCP: [facility] Past Medical History  Diagnosis Date  . Abnormal Pap smear   . UTI (lower urinary tract infection) 01/12/2013  . ADHD (attention deficit hyperactivity disorder)   . Chronic back pain   . Migraine headache    Past Surgical History  Procedure Laterality Date  . Tooth extraction    . Cesarean section N/A 09/08/2013    Procedure: CESAREAN SECTION;  Surgeon: Tereso Newcomer, MD;  Location: [facility];  Service: Obstetrics;  Laterality: N/A;   Family History  Problem Relation Age of Onset  . Diabetes Maternal Grandmother   . Diabetes Paternal Grandmother    Social History   Substance Use Topics  . Smoking status: Current Every Day Smoker -- 1.00 packs/day    Types: Cigarettes  . Smokeless tobacco: Never Used  . Alcohol Use: No   OB History    Gravida Para Term Preterm AB TAB SAB Ectopic Multiple Living   Review of Systems  Skin: Positive for wound (Laceration, sutured).      Allergies  Review of patient's allergies indicates no known allergies.  Home Medications   Prior to Admission medications   Medication Sig Start Date End Date Taking? Authorizing Provider  Acetaminophen-Caff-Pyrilamine (MIDOL COMPLETE) 500-60-15 MG TABS Take 2 tablets by mouth daily as needed (pain).    Historical Provider, MD  cephALEXin (KEFLEX) 500 MG capsule Take 1 capsule (500 mg total) by mouth 4 (four) times daily. For 7 days 07/29/15   [name], PA-C  cyclobenzaprine (FLEXERIL) 10 MG tablet Take 1 tablet (10 mg total) by mouth 3 (three) times daily as needed for muscle spasms. Patient not taking: Reported on 09/09/2014 08/01/14   [name], MD  desogestrel-ethinyl estradiol (APRI,EMOQUETTE,SOLIA) 0.15-30 MG-MCG tablet Take 1 tablet by mouth daily. 12/26/14   [name], MD  HYDROcodone-acetaminophen (NORCO/VICODIN) 5-325 MG tablet Take 1 tablet by mouth every 4 (four) hours as needed. 08/02/15   [name], PA-C  ibuprofen (ADVIL,MOTRIN) 800 MG tablet Take 1  tablet (800 mg total) by mouth 3 (three) times daily. 07/31/15   [name], PA-C  ketorolac (TORADOL) 10 MG tablet Take 1 tablet (10 mg total) by mouth every 8 (eight) hours as needed for moderate pain (with food). 06/18/15   Anne-Caroline Sharma CovertNorman, MD  naproxen (NAPROSYN) 500 MG tablet Take 1 tablet (500 mg total) by mouth 2 (two) times daily. 08/06/15   Burgess AmorJulie Dmauri Rosenow, PA-C  oxyCODONE-acetaminophen (PERCOCET/ROXICET) 5-325 MG tablet Take 1 tablet by mouth every 4 (four) hours as needed. 07/29/15   [name], PA-C  pantoprazole (PROTONIX) 20 MG tablet Take 1 tablet (20 mg total) by  mouth daily. 05/26/15   Gilda Creasehristopher J Pollina, MD  prochlorperazine (COMPAZINE) 10 MG tablet Take 1 tablet (10 mg total) by mouth every 6 (six) hours as needed for nausea. 06/18/15 06/17/16  Anne-Caroline Sharma CovertNorman, MD  promethazine (PHENERGAN) 25 MG tablet Take 1 tablet (25 mg total) by mouth every 6 (six) hours as needed for nausea or vomiting. 05/26/15   Gilda Creasehristopher J Pollina, MD  sucralfate (CARAFATE) 1 GM/10ML suspension Take 10 mLs (1 g total) by mouth 4 (four) times daily -  with meals and at bedtime. 05/26/15   Gilda Creasehristopher J Pollina, MD  sulfamethoxazole-trimethoprim (BACTRIM DS,SEPTRA DS) 800-160 MG tablet Take 1 tablet by mouth 2 (two) times daily. 08/06/15 08/13/15  Burgess AmorJulie Cozetta Seif, PA-C   BP 139/67 mmHg  Pulse 78  Temp(Src) 97.9 F (36.6 C) (Oral)  Resp 15  Ht 5\' 2"  (1.575 m)  Wt 59.421 kg  BMI 23.95 kg/m2  SpO2 100%  LMP 07/08/2015 Physical Exam  Constitutional: She is oriented to person, place, and time. She appears well-developed and well-nourished. No distress.  HENT:  Head: Normocephalic and atraumatic.  Eyes: Conjunctivae and EOM are normal.  Neck: Neck supple. No tracheal deviation present.  Cardiovascular: Normal rate.   Pulmonary/Chest: Effort normal. No respiratory distress.  Musculoskeletal: Normal range of motion.  Neurological: She is alert and oriented to person, place, and time.  Skin: Skin is warm and dry.  Left distal lower thumb has a healing laceration with sutures in place. There is moderate edema. Granulation tissue present along the margins. There is a trace of yellow discharge. No red streaking. No surrounding erythema.  Psychiatric: She has a normal mood and affect. Her behavior is normal.  Nursing note and vitals reviewed.   ED Course  Procedures   SUTURE REMOVAL Performed by: Burgess AmorIDOL, Rieley Khalsa  Consent: Verbal consent obtained. Patient identity confirmed: provided demographic data Time out: Immediately prior to procedure a "time out" was called to verify  the correct patient, procedure, equipment, support staff and site/side marked as required.  Location details: left thumb  Wound Appearance: macerated, granulation tissue along wound edges, intact, edematous.  Sutures/Staples Removed: 9  Facility: sutures placed in this facility Patient tolerance: Patient tolerated the procedure well with no immediate complications.    DIAGNOSTIC STUDIES: Oxygen Saturation is 100% on RA, normal by my interpretation.    COORDINATION OF CARE: 6:35 PM Will consult with Dr.  Bebe ShaggyWickline. Will order pain medication. Discussed treatment plan with pt at bedside and pt agreed to plan.  Labs Review Labs Reviewed - No data to display  Imaging Review No results found. I have personally reviewed and evaluated these images and lab results as part of my medical decision-making.   EKG Interpretation None      MDM   Final diagnoses:  Visit for suture removal    Sutures removed today.  Wound healing, but there  is a small amount of yellow drainage at the wound edges, concern for complicating infection if sutures remain in place.  Pt was advised warm water soak bid, soap and water wash, then dry the wound thoroughly,  Changing dressing during the day to keep the wound dry.  Continue wearing finger splint. Prescribed bactrim, referral to ortho for f/u this week.  Pt seen by Dr. Bebe Shaggy during this visit.   I personally performed the services described in this documentation, which was scribed in my presence. The recorded information has been reviewed and is accurate.   [name], PA-C 08/07/15 1352  Zadie Rhine, MD 08/07/15 [phone]

## 2015-08-06 NOTE — Discharge Instructions (Signed)
Suture Removal, Care After Refer to this sheet in the next few weeks. These instructions provide you with information on caring for yourself after your procedure. Your health care provider may also give you more specific instructions. Your treatment has been planned according to current medical practices, but problems sometimes occur. Call your health care provider if you have any problems or questions after your procedure. WHAT TO EXPECT AFTER THE PROCEDURE After your stitches (sutures) are removed, it is typical to have the following:  Some discomfort and swelling in the wound area.  Slight redness in the area. HOME CARE INSTRUCTIONS   If you have skin adhesive strips over the wound area, do not take the strips off. They will fall off on their own in a few days. If the strips remain in place after 14 days, you may remove them.  Change any bandages (dressings) at least once a day or as directed by your health care provider. If the bandage sticks, soak it off with warm, soapy water.  Apply cream or ointment only as directed by your health care provider. If using cream or ointment, wash the area with soap and water 2 times a day to remove all the cream or ointment. Rinse off the soap and pat the area dry with a clean towel.  Keep the wound area dry and clean. If the bandage becomes wet or dirty, or if it develops a bad smell, change it as soon as possible.  Continue to protect the wound from injury.  Use sunscreen when out in the sun. New scars become sunburned easily. SEEK MEDICAL CARE IF:  You have increasing redness, swelling, or pain in the wound.  You see pus coming from the wound.  You have a fever.  You notice a bad smell coming from the wound or dressing.  Your wound breaks open (edges not staying together).   This information is not intended to replace advice given to you by your health care provider. Make sure you discuss any questions you have with your health care  provider.   Document Released: 04/22/2001 Document Revised: 05/18/2013 Document Reviewed: 03/09/2013 Elsevier Interactive Patient Education 2016 [employer]   Continue to wear a bandage and change it often especially if it gets wet, as it appears her wound has been staying wet.  Wear your finger splint to protect the wound edges as this injury continues to heal.  Take the antibiotic as prescribed.  Call Dr. Mort SawyersHarrison's office for recheck of your injury this week.

## 2015-08-06 NOTE — ED Notes (Signed)
Wound recheck and suture removal of left thumb. Patient reports of yellow/green discharge.

## 2015-08-06 NOTE — ED Notes (Signed)
Pt states that she has been soaking her left thumb with sutures in place in water and soap

## 2015-08-06 NOTE — ED Notes (Signed)
Instructed pt to take all of antibiotics as prescribed. 

## 2015-08-09 ENCOUNTER — Telehealth: Payer: Self-pay | Admitting: *Deleted

## 2015-08-09 NOTE — Telephone Encounter (Signed)
I called patient and spoke with her today 08/09/15, I made her aware that she will need to see her PCP in order for them to send a referral over to us, patient said she will call them and schedule a appt. I also faxed the PCP.

## 2015-08-20 ENCOUNTER — Emergency Department (HOSPITAL_COMMUNITY)
Admission: EM | Admit: 2015-08-20 | Discharge: 2015-08-20 | Disposition: A | Discharge status: Home / Self Care | Payer: Medicaid Other | Attending: Emergency Medicine | Admitting: Emergency Medicine

## 2015-08-20 ENCOUNTER — Encounter (HOSPITAL_COMMUNITY): Payer: Self-pay

## 2015-08-20 DIAGNOSIS — Z8679 Personal history of other diseases of the circulatory system: Secondary | ICD-10-CM | POA: Insufficient documentation

## 2015-08-20 DIAGNOSIS — Z87440 Personal history of urinary (tract) infections: Secondary | ICD-10-CM | POA: Diagnosis not present

## 2015-08-20 DIAGNOSIS — G8929 Other chronic pain: Secondary | ICD-10-CM | POA: Diagnosis not present

## 2015-08-20 DIAGNOSIS — R05 Cough: Secondary | ICD-10-CM | POA: Diagnosis present

## 2015-08-20 DIAGNOSIS — Z793 Long term (current) use of hormonal contraceptives: Secondary | ICD-10-CM | POA: Insufficient documentation

## 2015-08-20 DIAGNOSIS — R0789 Other chest pain: Secondary | ICD-10-CM | POA: Diagnosis not present

## 2015-08-20 DIAGNOSIS — Z791 Long term (current) use of non-steroidal anti-inflammatories (NSAID): Secondary | ICD-10-CM | POA: Insufficient documentation

## 2015-08-20 DIAGNOSIS — Z8659 Personal history of other mental and behavioral disorders: Secondary | ICD-10-CM | POA: Insufficient documentation

## 2015-08-20 DIAGNOSIS — J069 Acute upper respiratory infection, unspecified: Secondary | ICD-10-CM

## 2015-08-20 DIAGNOSIS — F17210 Nicotine dependence, cigarettes, uncomplicated: Secondary | ICD-10-CM | POA: Insufficient documentation

## 2015-08-20 DIAGNOSIS — Z79899 Other long term (current) drug therapy: Secondary | ICD-10-CM | POA: Diagnosis not present

## 2015-08-20 MED ORDER — IBUPROFEN 800 MG PO TABS
800.0000 mg | ORAL_TABLET | Freq: Once | ORAL | Status: AC
  Administered 2015-08-20: 800 mg via ORAL
  Filled 2015-08-20: qty 1

## 2015-08-20 MED ORDER — IBUPROFEN 600 MG PO TABS: 600.0000 mg | ORAL_TABLET | Freq: Four times a day (QID) | ORAL | Status: DC | PRN

## 2015-08-20 MED ORDER — HYDROCOD POLST-CPM POLST ER 10-8 MG/5ML PO SUER
5.0000 mL | Freq: Once | ORAL | Status: AC
  Administered 2015-08-20: 5 mL via ORAL
  Filled 2015-08-20: qty 5

## 2015-08-20 MED ORDER — LORATADINE-PSEUDOEPHEDRINE ER 5-120 MG PO TB12: 1.0000 | ORAL_TABLET | Freq: Two times a day (BID) | ORAL | Status: DC

## 2015-08-20 NOTE — ED Notes (Signed)
Pt reports coughing since yesterday. Coughing which makes her chest hurt. Sore throat hurting since this am

## 2015-08-20 NOTE — ED Provider Notes (Signed)
CSN: [id number]     Arrival date & time 08/20/15  1412 History  By signing my name below, I, Jessica [name], attest that this documentation has been prepared under the direction and in the presence of Jessica Quale, PA-C. Electronically Signed: Randell [name], ED Scribe. 08/20/2015. 2:45 PM.   Chief Complaint  [name] presents with  . Cough   [name] is a 25 y.o. female presenting with cough. The history is provided by the [name]. No language interpreter was used.  Cough Cough characteristics:  Unable to specify Severity:  Moderate Onset quality:  Gradual Duration:  1 day Timing:  Intermittent Progression:  Unchanged Chronicity:  New Associated symptoms: chest pain (Secondary to cough), rhinorrhea and sore throat   Associated symptoms: no fever    HPI Comments: Jessica [name] is a 25 y.o. female who presents to the Emergency Department complaining of intermittent, mild, unchanging cough onset yesterday. [name] endorses associated sore throat, rhinorrhea, and chest pain secondary to cough onset this morning. She states sore throat is worse with movement and unchanged by being still. Per mother, she notes recent sick contact with her son who was seen in the ED earlier today for similar symptoms. She denies fever and hemoptysis.  Past Medical History  Diagnosis Date  . Abnormal Pap smear   . UTI (lower urinary tract infection) 01/12/2013  . ADHD (attention deficit hyperactivity disorder)   . Chronic back pain   . Migraine headache    Past Surgical History  Procedure Laterality Date  . Tooth extraction    . Cesarean section N/A 09/08/2013    Procedure: CESAREAN SECTION;  Surgeon: Tereso Newcomer, MD;  Location: [facility];  Service: Obstetrics;  Laterality: N/A;   Family History  Problem Relation Age of Onset  . Diabetes Maternal Grandmother   . Diabetes Paternal Grandmother    Social History  Substance Use Topics  . Smoking status: Current Every Day Smoker -- 1.00  packs/day    Types: Cigarettes  . Smokeless tobacco: Never Used  . Alcohol Use: No   OB History    Gravida Para Term Preterm AB TAB SAB Ectopic Multiple Living   1 1 1       1      Review of Systems  Constitutional: Negative for fever.  HENT: Positive for rhinorrhea and sore throat.   Respiratory: Positive for cough.   Cardiovascular: Positive for chest pain (Secondary to cough).  All other systems reviewed and are negative.     Allergies  Review of [name]'s allergies indicates no known allergies.  Home Medications   Prior to Admission medications   Medication Sig Start Date End Date Taking? Authorizing Provider  Acetaminophen-Caff-Pyrilamine (MIDOL COMPLETE) 500-60-15 MG TABS Take 2 tablets by mouth daily as needed (pain).    Historical Provider, MD  cephALEXin (KEFLEX) 500 MG capsule Take 1 capsule (500 mg total) by mouth 4 (four) times daily. For 7 days 07/29/15   [name], PA-C  cyclobenzaprine (FLEXERIL) 10 MG tablet Take 1 tablet (10 mg total) by mouth 3 (three) times daily as needed for muscle spasms. [name] not taking: Reported on 09/09/2014 08/01/14   [name], MD  desogestrel-ethinyl estradiol (APRI,EMOQUETTE,SOLIA) 0.15-30 MG-MCG tablet Take 1 tablet by mouth daily. 12/26/14   [name], MD  HYDROcodone-acetaminophen (NORCO/VICODIN) 5-325 MG tablet Take 1 tablet by mouth every 4 (four) hours as needed. 08/02/15   Jessica Quale, PA-C  ibuprofen (ADVIL,MOTRIN) 800 MG tablet Take 1 tablet (800 mg total) by mouth 3 (  three) times daily. 07/31/15   [name], PA-C  ketorolac (TORADOL) 10 MG tablet Take 1 tablet (10 mg total) by mouth every 8 (eight) hours as needed for moderate pain (with food). 06/18/15   [name], MD  naproxen (NAPROSYN) 500 MG tablet Take 1 tablet (500 mg total) by mouth 2 (two) times daily. 08/06/15   [name], PA-C  oxyCODONE-acetaminophen (PERCOCET/ROXICET) 5-325 MG tablet Take 1 tablet by mouth every 4 (four) hours as  needed. 07/29/15   [name], PA-C  pantoprazole (PROTONIX) 20 MG tablet Take 1 tablet (20 mg total) by mouth daily. 05/26/15   [name], MD  prochlorperazine (COMPAZINE) 10 MG tablet Take 1 tablet (10 mg total) by mouth every 6 (six) hours as needed for nausea. 06/18/15 06/17/16  [name], MD  promethazine (PHENERGAN) 25 MG tablet Take 1 tablet (25 mg total) by mouth every 6 (six) hours as needed for nausea or vomiting. 05/26/15   [name], MD  sucralfate (CARAFATE) 1 GM/10ML suspension Take 10 mLs (1 g total) by mouth 4 (four) times daily -  with meals and at bedtime. 05/26/15   [name], MD   BP 113/67 mmHg  Pulse 100  Resp 16  Ht 5\' 2"  (1.575 m)  Wt 132 lb (59.875 kg)  BMI 24.14 kg/m2  SpO2 100%  LMP 08/07/2015 Physical Exam  Constitutional: She is oriented to person, place, and time. She appears well-developed and well-nourished. No distress.  HENT:  Head: Normocephalic and atraumatic.  Right Ear: Tympanic membrane, external ear and ear canal normal.  Left Ear: Tympanic membrane, external ear and ear canal normal.  Mouth/Throat: Uvula is midline. Posterior oropharyngeal erythema present.  Ears clear. Mild increased redness of the posterior pharynx. Uvula is midline. Mild nasal congestion is present.  Eyes: Conjunctivae and EOM are normal.  Neck: Normal range of motion. Neck supple. No tracheal deviation present.  No cervical adenoptahy. FROM of neck.  Cardiovascular: Normal rate and regular rhythm.  Exam reveals no gallop and no friction rub.   Pulmonary/Chest: Effort normal. No respiratory distress.  Symmetrical rise and fall of chest. [name] is speaking in complete sentences. Lungs CTA bilaterally. Mild to moderate pain of the sternal area.  Abdominal: Soft. Bowel sounds are normal.  Musculoskeletal: Normal range of motion.  Lymphadenopathy:    She has no cervical adenopathy.  Neurological: She is alert and oriented to  person, place, and time.  Skin: Skin is warm and dry.  Psychiatric: She has a normal mood and affect. Her behavior is normal.  Nursing note and vitals reviewed.   ED Course  Procedures   DIAGNOSTIC STUDIES: Oxygen Saturation is 100% on RA, normal by my interpretation.    COORDINATION OF CARE: 2:20 PM Will prescribe Claritin D and ibuprofen. Advised pt to increase fluids and perform good handwashing. Discussed treatment plan with pt at bedside and pt agreed to plan.  Labs Review Labs Reviewed - No data to display  Imaging Review No results found. I have personally reviewed and evaluated these images and lab results as part of my medical decision-making.   EKG Interpretation None      MDM  Exam favors URI and chest wall pain. Pulse ox 100% on room air. Pt speaks in complete sentences. EKG NSR with out acute event. Will treat with claritin D and ibuprofen   Final diagnoses:  None    *I have reviewed nursing notes, vital signs, and all appropriate lab and imaging results for  this [name].**  **I personally performed the services described in this documentation, which was scribed in my presence. The recorded information has been reviewed and is accurate.Jessica [name]*  Ashon Rosenberg, PA-C 08/20/15 1458  Blane OharaJoshua Zavitz, MD 08/21/15 [id number]

## 2015-08-20 NOTE — Discharge Instructions (Signed)
Upper Respiratory Infection, Adult Most upper respiratory infections (URIs) are caused by a virus. A URI affects the nose, throat, and upper air passages. The most common type of URI is often called "the common cold." HOME CARE   Take medicines only as told by your doctor.  Gargle warm saltwater or take cough drops to comfort your throat as told by your doctor.  Use a warm mist humidifier or inhale steam from a shower to increase air moisture. This may make it easier to breathe.  Drink enough fluid to keep your pee (urine) clear or pale yellow.  Eat soups and other clear broths.  Have a healthy diet.  Rest as needed.  Go back to work when your fever is gone or your doctor says it is okay.  You may need to stay home longer to avoid giving your URI to others.  You can also wear a face mask and wash your hands often to prevent spread of the virus.  Use your inhaler more if you have asthma.  Do not use any tobacco products, including cigarettes, chewing tobacco, or electronic cigarettes. If you need help quitting, ask your doctor. GET HELP IF:  You are getting worse, not better.  Your symptoms are not helped by medicine.  You have chills.  You are getting more short of breath.  You have brown or red mucus.  You have yellow or brown discharge from your nose.  You have pain in your face, especially when you bend forward.  You have a fever.  You have puffy (swollen) neck glands.  You have pain while swallowing.  You have white areas in the back of your throat. GET HELP RIGHT AWAY IF:   You have very bad or constant:  Headache.  Ear pain.  Pain in your forehead, behind your eyes, and over your cheekbones (sinus pain).  Chest pain.  You have long-lasting (chronic) lung disease and any of the following:  Wheezing.  Long-lasting cough.  Coughing up blood.  A change in your usual mucus.  You have a stiff neck.  You have changes in  your:  Vision.  Hearing.  Thinking.  Mood. MAKE SURE YOU:   Understand these instructions.  Will watch your condition.  Will get help right away if you are not doing well or get worse.   This information is not intended to replace advice given to you by your health care provider. Make sure you discuss any questions you have with your health care provider.   Document Released: 01/14/2008 Document Revised: 12/12/2014 Document Reviewed: 11/02/2013 Elsevier Interactive Patient Education 2016 Elsevier Inc.  Chest Wall Pain Chest wall pain is pain in or around the bones and muscles of your chest. Sometimes, an injury causes this pain. Sometimes, the cause may not be known. This pain may take several weeks or longer to get better. HOME CARE Pay attention to any changes in your symptoms. Take these actions to help with your pain:  Rest as told by your doctor.  Avoid activities that cause pain. Try not to use your chest, belly (abdominal), or side muscles to lift heavy things.  If directed, apply ice to the painful area:  Put ice in a plastic bag.  Place a towel between your skin and the bag.  Leave the ice on for 20 minutes, 2-3 times per day.  Take over-the-counter and prescription medicines only as told by your doctor.  Do not use tobacco products, including cigarettes, chewing tobacco, and e-cigarettes. If  you need help quitting, ask your doctor.  Keep all follow-up visits as told by your doctor. This is important. GET HELP IF:  You have a fever.  Your chest pain gets worse.  You have new symptoms. GET HELP RIGHT AWAY IF:  You feel sick to your stomach (nauseous) or you throw up (vomit).  You feel sweaty or light-headed.  You have a cough with phlegm (sputum) or you cough up blood.  You are short of breath.   This information is not intended to replace advice given to you by your health care provider. Make sure you discuss any questions you have with your  health care provider.   Document Released: 01/14/2008 Document Revised: 04/18/2015 Document Reviewed: 10/23/2014 Elsevier Interactive Patient Education [employer] Else[employer] Inc[employer]

## 2015-10-23 ENCOUNTER — Encounter (HOSPITAL_COMMUNITY): Payer: Self-pay | Admitting: *Deleted

## 2015-10-23 ENCOUNTER — Emergency Department (HOSPITAL_COMMUNITY)
Admission: EM | Admit: 2015-10-23 | Discharge: 2015-10-23 | Disposition: A | Discharge status: Home / Self Care | Payer: Medicaid Other | Attending: Emergency Medicine | Admitting: Emergency Medicine

## 2015-10-23 DIAGNOSIS — F17210 Nicotine dependence, cigarettes, uncomplicated: Secondary | ICD-10-CM | POA: Insufficient documentation

## 2015-10-23 DIAGNOSIS — R000 Tachycardia, unspecified: Secondary | ICD-10-CM | POA: Diagnosis not present

## 2015-10-23 DIAGNOSIS — F909 Attention-deficit hyperactivity disorder, unspecified type: Secondary | ICD-10-CM | POA: Diagnosis not present

## 2015-10-23 DIAGNOSIS — J111 Influenza due to unidentified influenza virus with other respiratory manifestations: Secondary | ICD-10-CM | POA: Diagnosis not present

## 2015-10-23 DIAGNOSIS — Z792 Long term (current) use of antibiotics: Secondary | ICD-10-CM | POA: Insufficient documentation

## 2015-10-23 DIAGNOSIS — R0982 Postnasal drip: Secondary | ICD-10-CM | POA: Insufficient documentation

## 2015-10-23 DIAGNOSIS — R05 Cough: Secondary | ICD-10-CM | POA: Diagnosis present

## 2015-10-23 MED ORDER — IBUPROFEN 800 MG PO TABS
800.0000 mg | ORAL_TABLET | Freq: Once | ORAL | Status: AC
  Administered 2015-10-23: 800 mg via ORAL
  Filled 2015-10-23: qty 1

## 2015-10-23 MED ORDER — ACETAMINOPHEN 325 MG PO TABS
650.0000 mg | ORAL_TABLET | Freq: Once | ORAL | Status: AC
  Administered 2015-10-23: 650 mg via ORAL
  Filled 2015-10-23: qty 2

## 2015-10-23 MED ORDER — PROMETHAZINE-DM 6.25-15 MG/5ML PO SYRP: 5.0000 mL | ORAL_SOLUTION | Freq: Four times a day (QID) | ORAL | Status: DC

## 2015-10-23 MED ORDER — OXYMETAZOLINE HCL 0.05 % NA SOLN
1.0000 | Freq: Once | NASAL | Status: AC
  Administered 2015-10-23: 1 via NASAL
  Filled 2015-10-23: qty 15

## 2015-10-23 NOTE — ED Notes (Signed)
Pt comes in for productive cough and congestion starting yesterday.

## 2015-10-23 NOTE — ED Provider Notes (Signed)
CSN: [id number]     Arrival date & time 10/23/15  1237 History   First MD Initiated Contact with Patient 10/23/15 1410     Chief Complaint  Patient presents with  . Cough     (Consider location/radiation/quality/duration/timing/severity/associated sxs/prior Treatment) HPI Comments: Patient is a 25 year old female presents to the emergency department with 3 days of upper respiratory symptoms.  The patient states that over the last 3 days she is having a fever, body aches, sore throat, nasal congestion, and generally not feeling well. The patient states she's had some nausea, but no actual vomiting. She is not sure if she's been around anyone with flu. She has not had a flu vaccine this year. She states that nothing so far helps the pain. The problem is aggravated particularly with swallowing or coughing.  Patient is a 25 y.o. female presenting with cough. The history is provided by the patient.  Cough Associated symptoms: chills and myalgias   Associated symptoms: no chest pain, no eye discharge, no shortness of breath and no wheezing     Past Medical History  Diagnosis Date  . Abnormal Pap smear   . UTI (lower urinary tract infection) 01/12/2013  . ADHD (attention deficit hyperactivity disorder)   . Chronic back pain   . Migraine headache    Past Surgical History  Procedure Laterality Date  . Tooth extraction    . Cesarean section N/A 09/08/2013    Procedure: CESAREAN SECTION;  Surgeon: Tereso Newcomer, MD;  Location: [facility];  Service: Obstetrics;  Laterality: N/A;   Family History  Problem Relation Age of Onset  . Diabetes Maternal Grandmother   . Diabetes Paternal Grandmother    Social History  Substance Use Topics  . Smoking status: Current Every Day Smoker -- 1.00 packs/day    Types: Cigarettes  . Smokeless tobacco: Never Used  . Alcohol Use: No   OB History    Gravida Para Term Preterm AB TAB SAB Ectopic Multiple Living   Review of Systems   Constitutional: Positive for chills. Negative for activity change.       All ROS Neg except as noted in HPI  HENT: Positive for congestion and postnasal drip. Negative for nosebleeds.   Eyes: Negative for photophobia and discharge.  Respiratory: Positive for cough. Negative for shortness of breath and wheezing.   Cardiovascular: Negative for chest pain and palpitations.  Gastrointestinal: Negative for abdominal pain and blood in stool.  Genitourinary: Negative for dysuria, frequency and hematuria.  Musculoskeletal: Positive for myalgias. Negative for back pain, arthralgias and neck pain.  Skin: Negative.   Neurological: Negative for dizziness, seizures and speech difficulty.  Psychiatric/Behavioral: Negative for hallucinations and confusion.      Allergies  Review of patient's allergies indicates no known allergies.  Home Medications   Prior to Admission medications   Medication Sig Start Date End Date Taking? Authorizing Provider  amoxicillin (AMOXIL) 500 MG capsule Take 500 mg by mouth 3 (three) times daily.   Yes Historical Provider, MD  desogestrel-ethinyl estradiol (APRI,EMOQUETTE,SOLIA) 0.15-30 MG-MCG tablet Take 1 tablet by mouth daily. 12/26/14  Yes [name], MD  promethazine-dextromethorphan (PROMETHAZINE-DM) 6.25-15 MG/5ML syrup Take 5 mLs by mouth every 6 (six) hours. 10/23/15   [name], PA-C   BP 124/78 mmHg  Pulse 102  Temp(Src) 99.8 F (37.7 C) (Oral)  Resp 16  Ht  (1.575 m)  Wt 59.875 kg  BMI 24.14 kg/m2  SpO2 100%  LMP 10/09/2015 Physical Exam  Constitutional: She is oriented to person, place, and time. She appears well-developed and well-nourished.  Non-toxic appearance.  HENT:  Head: Normocephalic.  Right Ear: Tympanic membrane and external ear normal.  Left Ear: Tympanic membrane and external ear normal.  Nasal congestion present.  Eyes: EOM and lids are normal. Pupils are equal, round, and reactive to light.  Neck: Normal range of  motion. Neck supple. Carotid bruit is not present.  Cardiovascular: Regular rhythm, normal heart sounds, intact distal pulses and normal pulses.  Tachycardia present.   Pulmonary/Chest: Breath sounds normal. No respiratory distress.  Abdominal: Soft. Bowel sounds are normal. There is no tenderness. There is no guarding.  Musculoskeletal: Normal range of motion.  Lymphadenopathy:       Head (right side): No submandibular adenopathy present.       Head (left side): No submandibular adenopathy present.    She has no cervical adenopathy.  Neurological: She is alert and oriented to person, place, and time. She has normal strength. No cranial nerve deficit or sensory deficit.  Skin: Skin is warm and dry.  Psychiatric: She has a normal mood and affect. Her speech is normal.  Nursing note and vitals reviewed.   ED Course  Procedures (including critical care time) Labs Review Labs Reviewed - No data to display  Imaging Review No results found. I have personally reviewed and evaluated these images and lab results as part of my medical decision-making.   EKG Interpretation None      MDM  Vital signs reviewed. The examination favors influenza. Discussed with the patient the importance of good handwashing, good hydration, and the contagious nature of this illness. The patient will be treated with Afrin spray, promethazine DM cough medication, and Tylenol/ibuprofen for fever and body aches.    Final diagnoses:  Influenza    *I have reviewed nursing notes, vital signs, and all appropriate lab and imaging results for this patient.[address]**    Joline Encalada, PA-C 10/23/15 1516  Bethann BerkshireJoseph Zammit, MD 10/24/15 1538

## 2015-10-23 NOTE — Discharge Instructions (Signed)

## 2015-11-05 ENCOUNTER — Ambulatory Visit: Payer: Medicaid Other | Admitting: Obstetrics & Gynecology

## 2015-11-05 ENCOUNTER — Ambulatory Visit: Payer: Medicaid Other | Admitting: Obstetrics and Gynecology

## 2015-11-06 ENCOUNTER — Ambulatory Visit: Payer: Medicaid Other | Admitting: Obstetrics & Gynecology

## 2015-12-03 ENCOUNTER — Telehealth: Payer: Self-pay | Admitting: Obstetrics & Gynecology

## 2015-12-03 ENCOUNTER — Telehealth: Payer: Self-pay | Admitting: *Deleted

## 2015-12-03 MED ORDER — DESOGESTREL-ETHINYL ESTRADIOL 0.15-30 MG-MCG PO TABS: 1.0000 | ORAL_TABLET | Freq: Every day | ORAL | Status: DC

## 2015-12-03 NOTE — Telephone Encounter (Signed)
Pt requesting refill on OCP

## 2015-12-20 ENCOUNTER — Telehealth: Payer: Self-pay | Admitting: Obstetrics & Gynecology

## 2015-12-20 NOTE — Telephone Encounter (Signed)
Pt called stating that she would like a call back from a nurse, please contact pt

## 2015-12-20 NOTE — Telephone Encounter (Signed)
Pt c/o irregular vaginal bleeding with OCP, has not missed any pills or changed birth control. Pt given an appt with Dr. Despina HiddenEure for evaluation.

## 2015-12-24 ENCOUNTER — Encounter: Payer: Self-pay | Admitting: Obstetrics & Gynecology

## 2015-12-24 ENCOUNTER — Ambulatory Visit: Payer: Self-pay | Admitting: Obstetrics & Gynecology

## 2015-12-31 ENCOUNTER — Other Ambulatory Visit: Payer: Medicaid Other | Admitting: Obstetrics & Gynecology

## 2016-07-10 ENCOUNTER — Encounter (HOSPITAL_COMMUNITY): Payer: Self-pay | Admitting: Emergency Medicine

## 2016-07-10 ENCOUNTER — Emergency Department (HOSPITAL_COMMUNITY)
Admission: EM | Admit: 2016-07-10 | Discharge: 2016-07-10 | Disposition: A | Discharge status: Home / Self Care | Payer: Medicaid Other | Attending: Dermatology | Admitting: Dermatology

## 2016-07-10 DIAGNOSIS — F17210 Nicotine dependence, cigarettes, uncomplicated: Secondary | ICD-10-CM | POA: Insufficient documentation

## 2016-07-10 DIAGNOSIS — F909 Attention-deficit hyperactivity disorder, unspecified type: Secondary | ICD-10-CM | POA: Insufficient documentation

## 2016-07-10 DIAGNOSIS — Z5321 Procedure and treatment not carried out due to patient leaving prior to being seen by health care provider: Secondary | ICD-10-CM | POA: Diagnosis not present

## 2016-07-10 DIAGNOSIS — R0602 Shortness of breath: Secondary | ICD-10-CM | POA: Insufficient documentation

## 2016-07-10 LAB — CBG MONITORING, ED
Glucose-Capillary: 89 mg/dL (ref 65–99)
Glucose-Capillary: 94 mg/dL (ref 65–99)

## 2016-07-10 NOTE — ED Triage Notes (Addendum)
Per EMS: Pt here for panic attack that occurred at 0600 this morning, pt states she is sob but is in no acute distress at this time.  Pt does not why she is having anxiety, does not take medication for anxiety.  Has not eaten in 2 days. Denies si/hi.  130/87 98% with ems.

## 2016-07-10 NOTE — ED Notes (Signed)
Pt seen by registration leaving in a car 30 minutes ago.  RN was just informed.

## 2016-07-25 ENCOUNTER — Emergency Department (HOSPITAL_COMMUNITY)
Admission: EM | Admit: 2016-07-25 | Discharge: 2016-07-25 | Disposition: A | Discharge status: Home / Self Care | Payer: No Typology Code available for payment source

## 2016-07-25 ENCOUNTER — Encounter (HOSPITAL_COMMUNITY): Payer: Self-pay | Admitting: Emergency Medicine

## 2016-07-25 NOTE — ED Triage Notes (Deleted)
C/o SOB this am.  Denies cough or any pain.

## 2016-10-01 ENCOUNTER — Emergency Department (HOSPITAL_COMMUNITY)
Admission: EM | Admit: 2016-10-01 | Discharge: 2016-10-01 | Disposition: A | Discharge status: Home / Self Care | Payer: Medicaid Other | Attending: Emergency Medicine | Admitting: Emergency Medicine

## 2016-10-01 ENCOUNTER — Encounter (HOSPITAL_COMMUNITY): Payer: Self-pay | Admitting: Cardiology

## 2016-10-01 DIAGNOSIS — N939 Abnormal uterine and vaginal bleeding, unspecified: Secondary | ICD-10-CM | POA: Insufficient documentation

## 2016-10-01 DIAGNOSIS — F17210 Nicotine dependence, cigarettes, uncomplicated: Secondary | ICD-10-CM | POA: Insufficient documentation

## 2016-10-01 DIAGNOSIS — F909 Attention-deficit hyperactivity disorder, unspecified type: Secondary | ICD-10-CM | POA: Insufficient documentation

## 2016-10-01 LAB — CBC WITH DIFFERENTIAL/PLATELET
Basophils Absolute: 0 10*3/uL (ref 0.0–0.1)
Basophils Relative: 0 %
Eosinophils Absolute: 0.1 10*3/uL (ref 0.0–0.7)
Eosinophils Relative: 1 %
HCT: 40.7 % (ref 36.0–46.0)
Hemoglobin: 14.1 g/dL (ref 12.0–15.0)
Lymphocytes Relative: 39 %
Lymphs Abs: 2.9 10*3/uL (ref 0.7–4.0)
MCH: 29.5 pg (ref 26.0–34.0)
MCHC: 34.6 g/dL (ref 30.0–36.0)
MCV: 85.1 fL (ref 78.0–100.0)
Monocytes Absolute: 0.3 10*3/uL (ref 0.1–1.0)
Monocytes Relative: 5 %
Neutro Abs: 4.1 10*3/uL (ref 1.7–7.7)
Neutrophils Relative %: 55 %
Platelets: 229 10*3/uL (ref 150–400)
RBC: 4.78 MIL/uL (ref 3.87–5.11)
RDW: 12.4 % (ref 11.5–15.5)
WBC: 7.4 10*3/uL (ref 4.0–10.5)

## 2016-10-01 LAB — URINALYSIS, ROUTINE W REFLEX MICROSCOPIC
Bilirubin Urine: NEGATIVE
Glucose, UA: NEGATIVE mg/dL
Ketones, ur: NEGATIVE mg/dL
Nitrite: NEGATIVE
Protein, ur: NEGATIVE mg/dL
Specific Gravity, Urine: 1.006 (ref 1.005–1.030)
pH: 6 (ref 5.0–8.0)

## 2016-10-01 LAB — BASIC METABOLIC PANEL
Anion gap: 9 (ref 5–15)
BUN: 10 mg/dL (ref 6–20)
CO2: 23 mmol/L (ref 22–32)
Calcium: 9.3 mg/dL (ref 8.9–10.3)
Chloride: 104 mmol/L (ref 101–111)
Creatinine, Ser: 0.66 mg/dL (ref 0.44–1.00)
GFR calc Af Amer: >60 mL/min (ref >60)
GFR calc non Af Amer: >60 mL/min (ref >60)
Glucose, Bld: 83 mg/dL (ref 65–99)
Potassium: 3.6 mmol/L (ref 3.5–5.1)
Sodium: 136 mmol/L (ref 135–145)

## 2016-10-01 LAB — POC URINE PREG, ED: Preg Test, Ur: NEGATIVE
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
## 2016-10-01 NOTE — Discharge Instructions (Signed)
Return tomorrow at the given time for an ultrasound to evaluate your uterus and ovaries.  Return to the emergency department if your bleeding worsens, you develop dizziness, shortness of breath, or other new and concerning symptoms.

## 2016-10-01 NOTE — ED Triage Notes (Signed)
Vaginal bleeding and cramping times 1 1/2 months.

## 2016-10-06 ENCOUNTER — Other Ambulatory Visit: Payer: Self-pay | Admitting: Obstetrics & Gynecology

## 2017-01-20 ENCOUNTER — Encounter (HOSPITAL_COMMUNITY): Payer: Self-pay

## 2017-01-20 ENCOUNTER — Emergency Department (HOSPITAL_COMMUNITY)
Admission: EM | Admit: 2017-01-20 | Discharge: 2017-01-20 | Disposition: A | Discharge status: Home / Self Care | Payer: Medicaid Other | Attending: Emergency Medicine | Admitting: Emergency Medicine

## 2017-01-20 DIAGNOSIS — R51 Headache: Secondary | ICD-10-CM | POA: Diagnosis not present

## 2017-01-20 DIAGNOSIS — M791 Myalgia: Secondary | ICD-10-CM | POA: Insufficient documentation

## 2017-01-20 DIAGNOSIS — R509 Fever, unspecified: Secondary | ICD-10-CM | POA: Diagnosis not present

## 2017-01-20 DIAGNOSIS — R110 Nausea: Secondary | ICD-10-CM | POA: Insufficient documentation

## 2017-01-20 DIAGNOSIS — F17210 Nicotine dependence, cigarettes, uncomplicated: Secondary | ICD-10-CM | POA: Diagnosis not present

## 2017-01-20 DIAGNOSIS — R519 Headache, unspecified: Secondary | ICD-10-CM

## 2017-01-20 DIAGNOSIS — F909 Attention-deficit hyperactivity disorder, unspecified type: Secondary | ICD-10-CM | POA: Insufficient documentation

## 2017-01-20 MED ORDER — KETOROLAC TROMETHAMINE 30 MG/ML IJ SOLN
30.0000 mg | Freq: Once | INTRAMUSCULAR | Status: AC
  Administered 2017-01-20: 30 mg via INTRAMUSCULAR
  Filled 2017-01-20: qty 1

## 2017-01-20 MED ORDER — LORAZEPAM 0.5 MG PO TABS
0.5000 mg | ORAL_TABLET | Freq: Once | ORAL | Status: AC
  Administered 2017-01-20: 0.5 mg via ORAL
  Filled 2017-01-20: qty 1

## 2017-01-20 NOTE — ED Triage Notes (Signed)
Patient states that she was started on a new medication (celexa 40mg ) and has taken her first dose today around 1730 today.  Feels like throat is swelling shut.  Head is hurting.

## 2017-01-20 NOTE — ED Notes (Signed)
Pt waiting for shot time to discharge
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
## 2017-01-20 NOTE — Discharge Instructions (Signed)
I'm not sure your symptoms are related to you new medication. I want you to stop it for now though and discuss with whomever prescribed it to you.

## 2017-01-20 NOTE — ED Notes (Signed)
Patient given discharge instruction, verbalized understand. Patient ambulatory out of the department.  

## 2017-01-25 ENCOUNTER — Emergency Department (HOSPITAL_COMMUNITY)
Admission: EM | Admit: 2017-01-25 | Discharge: 2017-01-25 | Disposition: A | Discharge status: Home / Self Care | Payer: Medicaid Other | Attending: Emergency Medicine | Admitting: Emergency Medicine

## 2017-01-25 ENCOUNTER — Encounter (HOSPITAL_COMMUNITY): Payer: Self-pay | Admitting: *Deleted

## 2017-01-25 DIAGNOSIS — J029 Acute pharyngitis, unspecified: Secondary | ICD-10-CM | POA: Diagnosis present

## 2017-01-25 DIAGNOSIS — F909 Attention-deficit hyperactivity disorder, unspecified type: Secondary | ICD-10-CM | POA: Diagnosis not present

## 2017-01-25 DIAGNOSIS — F419 Anxiety disorder, unspecified: Secondary | ICD-10-CM | POA: Insufficient documentation

## 2017-01-25 DIAGNOSIS — J069 Acute upper respiratory infection, unspecified: Secondary | ICD-10-CM | POA: Insufficient documentation

## 2017-01-25 DIAGNOSIS — J040 Acute laryngitis: Secondary | ICD-10-CM | POA: Insufficient documentation

## 2017-01-25 DIAGNOSIS — F17210 Nicotine dependence, cigarettes, uncomplicated: Secondary | ICD-10-CM | POA: Diagnosis not present

## 2017-01-25 DIAGNOSIS — F410 Panic disorder [episodic paroxysmal anxiety] without agoraphobia: Secondary | ICD-10-CM

## 2017-01-25 LAB — RAPID STREP SCREEN (MED CTR MEBANE ONLY): Streptococcus, Group A Screen (Direct): NEGATIVE

## 2017-01-25 MED ORDER — LORAZEPAM 1 MG PO TABS
1.0000 mg | ORAL_TABLET | Freq: Once | ORAL | Status: AC
  Administered 2017-01-25: 1 mg via ORAL
  Filled 2017-01-25: qty 1

## 2017-01-25 MED ORDER — SODIUM CHLORIDE 0.9 % IV BOLUS (SEPSIS)
1000.0000 mL | Freq: Once | INTRAVENOUS | Status: AC
  Administered 2017-01-25: 1000 mL via INTRAVENOUS

## 2017-01-25 MED ORDER — ACETAMINOPHEN 325 MG PO TABS
650.0000 mg | ORAL_TABLET | Freq: Once | ORAL | Status: AC
  Administered 2017-01-25: 650 mg via ORAL
  Filled 2017-01-25: qty 2

## 2017-01-25 MED ORDER — KETOROLAC TROMETHAMINE 30 MG/ML IJ SOLN
15.0000 mg | Freq: Once | INTRAMUSCULAR | Status: AC
  Administered 2017-01-25: 15 mg via INTRAMUSCULAR
  Filled 2017-01-25: qty 1

## 2017-01-25 MED ORDER — HYDROXYZINE HCL 25 MG PO TABS
50.0000 mg | ORAL_TABLET | Freq: Once | ORAL | Status: AC
  Administered 2017-01-25: 50 mg via ORAL
  Filled 2017-01-25: qty 2

## 2017-01-25 MED ORDER — DIPHENHYDRAMINE HCL 50 MG/ML IJ SOLN
25.0000 mg | Freq: Once | INTRAMUSCULAR | Status: AC
  Administered 2017-01-25: 25 mg via INTRAVENOUS
  Filled 2017-01-25: qty 1

## 2017-01-25 MED ORDER — FLUTICASONE PROPIONATE 50 MCG/ACT NA SUSP: 2.0000 | Freq: Every day | NASAL | 2 refills | Status: DC

## 2017-01-25 MED ORDER — SALINE SPRAY 0.65 % NA SOLN: 1.0000 | NASAL | 0 refills | Status: DC | PRN

## 2017-01-25 MED ORDER — IBUPROFEN 400 MG PO TABS
400.0000 mg | ORAL_TABLET | Freq: Once | ORAL | Status: DC
  Filled 2017-01-25: qty 1

## 2017-01-25 MED ORDER — PROCHLORPERAZINE EDISYLATE 5 MG/ML IJ SOLN
10.0000 mg | Freq: Once | INTRAMUSCULAR | Status: AC
  Administered 2017-01-25: 10 mg via INTRAVENOUS
  Filled 2017-01-25: qty 2

## 2017-01-25 MED ORDER — NAPROXEN 500 MG PO TABS: 500.0000 mg | ORAL_TABLET | Freq: Two times a day (BID) | ORAL | 0 refills | Status: DC

## 2017-01-25 NOTE — ED Notes (Signed)
Pt states "Ibuprofen isn't going to help and I haven't eaten today so I can't take it because it will upset my stomach".  Informed Dr. Wilkie AyeHorton

## 2017-01-25 NOTE — ED Notes (Signed)
Pt states "I'm getting sleepy, I feel better"  Pt states "Can you give me more Toradol in my IV, maybe like 5mg ".  Informed pt that I could not give medications that are not ordered by the physician.  Pt states "what else are you going to give me in my IV".  Again informed pt that I cannot give anything unless the physician orders it and there are no more medications ordered at this time.

## 2017-01-25 NOTE — ED Notes (Signed)
Offered pt some crackers or something to eat, pt refused

## 2017-01-25 NOTE — ED Triage Notes (Signed)
Pt c/o sore throat, hoarseness, bilateral earache that started last night,

## 2017-01-25 NOTE — ED Notes (Signed)
Pt called out and states "that medicine you gave me is making my panic attack worse and I don't know what to do"  Informed Dr. Wilkie AyeHorton

## 2017-01-25 NOTE — ED Notes (Signed)
Dr Wilkie AyeHorton at bedside with patient discussing treatment options.

## 2017-01-25 NOTE — ED Notes (Signed)
Pt states understanding of care given and follow up instructions.  Pt sleepy but oriented and able to ambulate with steady gait from ED.  Pt instructed to not drive a vehicle

## 2017-01-25 NOTE — Discharge Instructions (Signed)
You were seen today initially for symptoms of an upper respiratory infection. This is likely viral in nature. Start nasal sprays including Flonase and nasal saline. Naproxen as needed for headache or pain. Gargle warm salt water for laryngitis.  Regarding your anxiety, you need to follow-up very closely with your primary physician. See provided resources for psychiatry.  If you develop thoughts of wanting to hurt herself or anyone else you need to be re-seen immediately.  Please return for any new or worsening symptoms.

## 2017-01-25 NOTE — ED Notes (Signed)
Pt waiting in room for driver

## 2017-01-25 NOTE — ED Notes (Signed)
Pt states the vistaril has made her panic attack worse.  Informed Dr. Wilkie AyeHorton.

## 2017-01-25 NOTE — ED Notes (Signed)
Pt requesting nurse to room.  Pt states "my son is crying so I have to go now, I need the IV out"

## 2017-01-25 NOTE — ED Notes (Signed)
Pt called out and states she is having a panic attack.  Informed Dr. Wilkie AyeHorton.  Dr. Wilkie AyeHorton to bedside to evaluate pt

## 2017-01-25 NOTE — ED Notes (Signed)
Pt states she has a ride to take her home

## 2017-01-25 NOTE — ED Notes (Signed)
Attempted to discharge pt.  Pt states "I can't go home with my head hurting like this, it feels like I'm getting a migraine".  Informed pt that Dr. Wilkie AyeHorton probably would not give her any more medications because she has had 2 doses of Ativan and Vistaril.  Pt states again "I can't go home like this".  Informed Dr. Wilkie AyeHorton

## 2017-01-25 NOTE — ED Notes (Signed)
Informed pt that Dr. Wilkie AyeHorton had prescribed a Toradol injection instead of the Ibuprofen.  Pt states "That's not going to help either, I'm hurting bad".  When asked if pt still wanted Toradol injection pt shook head no.

## 2017-01-25 NOTE — ED Notes (Signed)
Pt requested nurse to room.  Pt states "Can you ask her if she will give me pain medicine in my IV?"  Informed pt that Dr has stated that she will not give her narcotic pain medication.  Pt states "ok, I'm about to fall asleep"
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
## 2017-01-25 NOTE — ED Notes (Signed)
Informed pt that I would need to see her ride before she could leave.  Pt called "Jessica [name]" and told her she would have to come in.  Removed pts IV and gave DC instructions.  Informed pt if she were to leave prior to her driver coming in I would be forced to [place] [facility] because of the amount of medications she has been given.

## 2017-01-27 LAB — CULTURE, GROUP A STREP (THRC)

## 2017-02-22 ENCOUNTER — Encounter (HOSPITAL_COMMUNITY): Payer: Self-pay | Admitting: Emergency Medicine

## 2017-02-22 ENCOUNTER — Emergency Department (HOSPITAL_COMMUNITY)
Admission: EM | Admit: 2017-02-22 | Discharge: 2017-02-22 | Disposition: A | Discharge status: Home / Self Care | Payer: No Typology Code available for payment source | Attending: Emergency Medicine | Admitting: Emergency Medicine

## 2017-02-22 ENCOUNTER — Emergency Department (HOSPITAL_COMMUNITY): Payer: No Typology Code available for payment source

## 2017-02-22 DIAGNOSIS — M25512 Pain in left shoulder: Secondary | ICD-10-CM

## 2017-02-22 DIAGNOSIS — W2210XA Striking against or struck by unspecified automobile airbag, initial encounter: Secondary | ICD-10-CM | POA: Insufficient documentation

## 2017-02-22 DIAGNOSIS — Y998 Other external cause status: Secondary | ICD-10-CM | POA: Insufficient documentation

## 2017-02-22 DIAGNOSIS — S199XXA Unspecified injury of neck, initial encounter: Secondary | ICD-10-CM | POA: Diagnosis present

## 2017-02-22 DIAGNOSIS — S161XXA Strain of muscle, fascia and tendon at neck level, initial encounter: Secondary | ICD-10-CM

## 2017-02-22 DIAGNOSIS — F17210 Nicotine dependence, cigarettes, uncomplicated: Secondary | ICD-10-CM | POA: Insufficient documentation

## 2017-02-22 DIAGNOSIS — Y929 Unspecified place or not applicable: Secondary | ICD-10-CM | POA: Diagnosis not present

## 2017-02-22 DIAGNOSIS — Z79899 Other long term (current) drug therapy: Secondary | ICD-10-CM | POA: Diagnosis not present

## 2017-02-22 DIAGNOSIS — Y9389 Activity, other specified: Secondary | ICD-10-CM | POA: Diagnosis not present

## 2017-02-22 DIAGNOSIS — M549 Dorsalgia, unspecified: Secondary | ICD-10-CM | POA: Diagnosis not present

## 2017-02-22 MED ORDER — ONDANSETRON HCL 4 MG PO TABS
4.0000 mg | ORAL_TABLET | Freq: Once | ORAL | Status: AC
  Administered 2017-02-22: 4 mg via ORAL
  Filled 2017-02-22: qty 1

## 2017-02-22 MED ORDER — DIAZEPAM 5 MG PO TABS
10.0000 mg | ORAL_TABLET | Freq: Once | ORAL | Status: AC
  Administered 2017-02-22: 10 mg via ORAL
  Filled 2017-02-22: qty 2

## 2017-02-22 MED ORDER — CYCLOBENZAPRINE HCL 10 MG PO TABS: 10.0000 mg | ORAL_TABLET | Freq: Three times a day (TID) | ORAL | 0 refills | Status: DC

## 2017-02-22 MED ORDER — TRAMADOL HCL 50 MG PO TABS
50.0000 mg | ORAL_TABLET | Freq: Once | ORAL | Status: AC
  Administered 2017-02-22: 50 mg via ORAL
  Filled 2017-02-22: qty 1

## 2017-02-22 NOTE — ED Notes (Signed)
Upon giving pt meds she reports, "tramadol isnt going to help me" She is encouraged to allow this reporter to finish telling her about her meds including valium and zofran as benzo, smooth muchle relaxer etc
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
## 2017-02-22 NOTE — Discharge Instructions (Signed)
Your vital signs are within normal limits. Your x-rays are negative for fracture or dislocation. You can expect to be sore over the next few days. Please use Flexeril times daily for spasm pain. This medication may cause drowsiness. Please do not drive, drink alcohol, operate machinery, or participated in activities requiring concentration when taking this medication. You may use Tylenol, and or ibuprofen if needed for any additional pain or discomfort along with the Flexeril. Please see Dr.Zheng for follow-up and management in the office.
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
## 2017-02-25 ENCOUNTER — Encounter (HOSPITAL_COMMUNITY): Payer: Self-pay | Admitting: *Deleted

## 2017-02-25 ENCOUNTER — Emergency Department (HOSPITAL_COMMUNITY)
Admission: EM | Admit: 2017-02-25 | Discharge: 2017-02-25 | Disposition: A | Discharge status: Home / Self Care | Payer: Medicaid Other | Attending: Emergency Medicine | Admitting: Emergency Medicine

## 2017-02-25 ENCOUNTER — Emergency Department (HOSPITAL_COMMUNITY): Payer: Medicaid Other

## 2017-02-25 DIAGNOSIS — F909 Attention-deficit hyperactivity disorder, unspecified type: Secondary | ICD-10-CM | POA: Insufficient documentation

## 2017-02-25 DIAGNOSIS — F17210 Nicotine dependence, cigarettes, uncomplicated: Secondary | ICD-10-CM | POA: Diagnosis not present

## 2017-02-25 DIAGNOSIS — Z79899 Other long term (current) drug therapy: Secondary | ICD-10-CM | POA: Insufficient documentation

## 2017-02-25 DIAGNOSIS — F410 Panic disorder [episodic paroxysmal anxiety] without agoraphobia: Secondary | ICD-10-CM | POA: Insufficient documentation

## 2017-02-25 LAB — I-STAT CHEM 8, ED
BUN: 14 mg/dL (ref 6–20)
Calcium, Ion: 1.13 mmol/L — ABNORMAL LOW (ref 1.15–1.40)
Chloride: 106 mmol/L (ref 101–111)
Creatinine, Ser: 0.8 mg/dL (ref 0.44–1.00)
Glucose, Bld: 88 mg/dL (ref 65–99)
HCT: 35 % — ABNORMAL LOW (ref 36.0–46.0)
Hemoglobin: 11.9 g/dL — ABNORMAL LOW (ref 12.0–15.0)
Potassium: 3.7 mmol/L (ref 3.5–5.1)
Sodium: 141 mmol/L (ref 135–145)
TCO2: 22 mmol/L (ref 0–100)

## 2017-02-25 LAB — URINALYSIS, ROUTINE W REFLEX MICROSCOPIC
Bilirubin Urine: NEGATIVE
Glucose, UA: NEGATIVE mg/dL
Hgb urine dipstick: NEGATIVE
Ketones, ur: NEGATIVE mg/dL
Leukocytes, UA: NEGATIVE
Nitrite: NEGATIVE
Protein, ur: NEGATIVE mg/dL
Specific Gravity, Urine: 1.013 (ref 1.005–1.030)
pH: 6 (ref 5.0–8.0)

## 2017-02-25 LAB — PREGNANCY, URINE: Preg Test, Ur: NEGATIVE

## 2017-02-25 LAB — D-DIMER, QUANTITATIVE: D-Dimer, Quant: <0.27 ug/mL-FEU (ref 0.00–0.50)

## 2017-02-25 MED ORDER — LORAZEPAM 1 MG PO TABS
1.0000 mg | ORAL_TABLET | Freq: Once | ORAL | Status: AC
  Administered 2017-02-25: 1 mg via ORAL
  Filled 2017-02-25: qty 1

## 2017-02-25 MED ORDER — NAPROXEN 250 MG PO TABS
500.0000 mg | ORAL_TABLET | Freq: Once | ORAL | Status: DC
  Filled 2017-02-25: qty 2

## 2017-02-25 MED ORDER — DIPHENHYDRAMINE HCL 25 MG PO CAPS
25.0000 mg | ORAL_CAPSULE | Freq: Once | ORAL | Status: DC
  Filled 2017-02-25: qty 1

## 2017-02-25 NOTE — Discharge Instructions (Signed)
There is no evidence of heart attack or blood clot in the lung. Follow up with your doctor. Return to the ED if you develop new or worsening symptoms.  °

## 2017-02-25 NOTE — ED Notes (Signed)
Pt states understanding of care given and follow up instructions.  Pt a/o ambulated from ED with steady gait.  Pt called for family member to transport home
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
## 2017-02-25 NOTE — ED Notes (Signed)
Pt states "benadryl isn't going to help me".  PT tearful at this time
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
## 2017-05-11 ENCOUNTER — Encounter (HOSPITAL_COMMUNITY): Payer: Self-pay | Admitting: Emergency Medicine

## 2017-05-11 ENCOUNTER — Emergency Department (HOSPITAL_COMMUNITY)
Admission: EM | Admit: 2017-05-11 | Discharge: 2017-05-12 | Disposition: A | Discharge status: Home / Self Care | Payer: No Typology Code available for payment source | Attending: Emergency Medicine | Admitting: Emergency Medicine

## 2017-05-11 DIAGNOSIS — Y998 Other external cause status: Secondary | ICD-10-CM | POA: Diagnosis not present

## 2017-05-11 DIAGNOSIS — Y939 Activity, unspecified: Secondary | ICD-10-CM | POA: Diagnosis not present

## 2017-05-11 DIAGNOSIS — F410 Panic disorder [episodic paroxysmal anxiety] without agoraphobia: Secondary | ICD-10-CM | POA: Diagnosis not present

## 2017-05-11 DIAGNOSIS — Y92410 Unspecified street and highway as the place of occurrence of the external cause: Secondary | ICD-10-CM | POA: Diagnosis not present

## 2017-05-11 DIAGNOSIS — S39012A Strain of muscle, fascia and tendon of lower back, initial encounter: Secondary | ICD-10-CM | POA: Diagnosis not present

## 2017-05-11 DIAGNOSIS — S3992XA Unspecified injury of lower back, initial encounter: Secondary | ICD-10-CM | POA: Diagnosis present

## 2017-05-11 DIAGNOSIS — T148XXA Other injury of unspecified body region, initial encounter: Secondary | ICD-10-CM

## 2017-05-11 DIAGNOSIS — F17210 Nicotine dependence, cigarettes, uncomplicated: Secondary | ICD-10-CM | POA: Insufficient documentation

## 2017-05-11 DIAGNOSIS — S161XXA Strain of muscle, fascia and tendon at neck level, initial encounter: Secondary | ICD-10-CM | POA: Diagnosis not present

## 2017-05-11 MED ORDER — ONDANSETRON HCL 4 MG PO TABS
4.0000 mg | ORAL_TABLET | Freq: Once | ORAL | Status: AC
  Administered 2017-05-11: 4 mg via ORAL
  Filled 2017-05-11: qty 1

## 2017-05-11 MED ORDER — IBUPROFEN 800 MG PO TABS
800.0000 mg | ORAL_TABLET | Freq: Once | ORAL | Status: AC
  Administered 2017-05-11: 800 mg via ORAL
  Filled 2017-05-11: qty 1

## 2017-05-11 MED ORDER — DIAZEPAM 5 MG PO TABS
10.0000 mg | ORAL_TABLET | Freq: Once | ORAL | Status: AC
  Administered 2017-05-11: 10 mg via ORAL
  Filled 2017-05-11: qty 2

## 2017-05-11 MED ORDER — HYDROCODONE-ACETAMINOPHEN 5-325 MG PO TABS
2.0000 | ORAL_TABLET | Freq: Once | ORAL | Status: AC
  Administered 2017-05-11: 2 via ORAL
  Filled 2017-05-11: qty 2
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
## 2017-05-11 NOTE — ED Notes (Signed)
Pt took c collar off in waiting room.

## 2017-05-11 NOTE — ED Triage Notes (Signed)
Called no answer
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
## 2017-05-12 MED ORDER — CYCLOBENZAPRINE HCL 10 MG PO TABS: 10.0000 mg | ORAL_TABLET | Freq: Three times a day (TID) | ORAL | 0 refills | Status: DC

## 2017-05-12 NOTE — Discharge Instructions (Signed)
Your vital signs are within normal limits. Your examination suggests muscle strain and panic attacks. Please use Flexeril 3 times daily to assist with your muscle strain. Please discuss your panic attacks with Dr. Sherrilee Gilles. Return to the emergency department if any emergent changes, problems, or concerns.

## 2017-06-11 ENCOUNTER — Encounter: Payer: Self-pay | Admitting: Adult Health

## 2017-06-23 ENCOUNTER — Other Ambulatory Visit: Payer: Self-pay

## 2017-06-24 ENCOUNTER — Other Ambulatory Visit: Payer: Self-pay | Admitting: Obstetrics & Gynecology

## 2017-06-24 DIAGNOSIS — O3680X0 Pregnancy with inconclusive fetal viability, not applicable or unspecified: Secondary | ICD-10-CM

## 2017-06-26 ENCOUNTER — Encounter (INDEPENDENT_AMBULATORY_CARE_PROVIDER_SITE_OTHER): Payer: Self-pay

## 2017-06-26 ENCOUNTER — Ambulatory Visit (INDEPENDENT_AMBULATORY_CARE_PROVIDER_SITE_OTHER): Payer: Medicaid Other

## 2017-06-26 DIAGNOSIS — Z3A01 Less than 8 weeks gestation of pregnancy: Secondary | ICD-10-CM | POA: Diagnosis not present

## 2017-06-26 DIAGNOSIS — O3680X0 Pregnancy with inconclusive fetal viability, not applicable or unspecified: Secondary | ICD-10-CM | POA: Diagnosis not present

## 2017-06-26 NOTE — Progress Notes (Signed)
Dating US today. Single IUP with FHR 118 bpm. CRL measures 4.2 mm which correlates with 6+[redacted] weeks GA. Ovaries not well visualized today. EDD 02/19/18 by today's US.

## 2017-07-06 ENCOUNTER — Telehealth: Payer: Self-pay | Admitting: Advanced Practice Midwife

## 2017-07-06 NOTE — Telephone Encounter (Signed)
Patient called stating that she would like for a doctor to call her in something for nausea pt has an appointment coming up on the 29th, but she states she needs something now. Please contact pt

## 2017-07-06 NOTE — Telephone Encounter (Signed)
Pt called requesting medication for nausea. She uses [facility] in [place]. Informed pt that we would send her request to a provider and she should check with her pharmacy in the next 24 hours.

## 2017-07-07 ENCOUNTER — Telehealth: Payer: Self-pay | Admitting: *Deleted

## 2017-07-07 ENCOUNTER — Other Ambulatory Visit: Payer: Self-pay | Admitting: Women's Health

## 2017-07-07 MED ORDER — DOXYLAMINE-PYRIDOXINE ER 20-20 MG PO TBCR: 1.0000 | EXTENDED_RELEASE_TABLET | Freq: Every day | ORAL | 8 refills | Status: DC

## 2017-07-07 MED ORDER — DOXYLAMINE-PYRIDOXINE 10-10 MG PO TBEC: DELAYED_RELEASE_TABLET | ORAL | 6 refills | Status: DC

## 2017-07-07 NOTE — Telephone Encounter (Signed)
Informed pharmacy PA was approved for Diclegis. Pt also made aware.

## 2017-07-09 ENCOUNTER — Encounter: Payer: Self-pay | Admitting: Advanced Practice Midwife

## 2017-07-09 ENCOUNTER — Ambulatory Visit: Payer: Medicaid Other | Admitting: *Deleted

## 2017-07-09 ENCOUNTER — Encounter: Payer: Medicaid Other | Admitting: Women's Health

## 2017-07-09 ENCOUNTER — Ambulatory Visit (INDEPENDENT_AMBULATORY_CARE_PROVIDER_SITE_OTHER): Payer: Medicaid Other | Admitting: Advanced Practice Midwife

## 2017-07-09 VITALS — BP 102/54 | HR 64 | Wt 143.0 lb

## 2017-07-09 DIAGNOSIS — F419 Anxiety disorder, unspecified: Secondary | ICD-10-CM

## 2017-07-09 DIAGNOSIS — Z3481 Encounter for supervision of other normal pregnancy, first trimester: Secondary | ICD-10-CM

## 2017-07-09 DIAGNOSIS — Z98891 History of uterine scar from previous surgery: Secondary | ICD-10-CM

## 2017-07-09 DIAGNOSIS — Z8759 Personal history of other complications of pregnancy, childbirth and the puerperium: Secondary | ICD-10-CM

## 2017-07-09 DIAGNOSIS — Z3A01 Less than 8 weeks gestation of pregnancy: Secondary | ICD-10-CM

## 2017-07-09 DIAGNOSIS — O34219 Maternal care for unspecified type scar from previous cesarean delivery: Secondary | ICD-10-CM | POA: Diagnosis not present

## 2017-07-09 DIAGNOSIS — O99341 Other mental disorders complicating pregnancy, first trimester: Secondary | ICD-10-CM | POA: Diagnosis not present

## 2017-07-09 DIAGNOSIS — Z3490 Encounter for supervision of normal pregnancy, unspecified, unspecified trimester: Secondary | ICD-10-CM | POA: Insufficient documentation

## 2017-07-09 DIAGNOSIS — Z331 Pregnant state, incidental: Secondary | ICD-10-CM

## 2017-07-09 DIAGNOSIS — Z1389 Encounter for screening for other disorder: Secondary | ICD-10-CM

## 2017-07-09 DIAGNOSIS — Z3682 Encounter for antenatal screening for nuchal translucency: Secondary | ICD-10-CM

## 2017-07-09 HISTORY — DX: History of uterine scar from previous surgery: Z98.891

## 2017-07-09 LAB — POCT URINALYSIS DIPSTICK
Blood, UA: NEGATIVE
Glucose, UA: NEGATIVE
Ketones, UA: NEGATIVE
Leukocytes, UA: NEGATIVE
Nitrite, UA: NEGATIVE
Protein, UA: NEGATIVE

## 2017-07-09 MED ORDER — ESCITALOPRAM OXALATE 10 MG PO TABS: 10.0000 mg | ORAL_TABLET | Freq: Every day | ORAL | 6 refills | Status: DC

## 2017-07-09 NOTE — Patient Instructions (Signed)
 First Trimester of Pregnancy The first trimester of pregnancy is from week 1 until the end of week 12 (months 1 through 3). A week after a sperm fertilizes an egg, the egg will implant on the wall of the uterus. This embryo will begin to develop into a baby. Genes from you and your partner are forming the baby. The female genes determine whether the baby is a boy or a girl. At 6-8 weeks, the eyes and face are formed, and the heartbeat can be seen on ultrasound. At the end of 12 weeks, all the baby's organs are formed.  Now that you are pregnant, you will want to do everything you can to have a healthy baby. Two of the most important things are to get good prenatal care and to follow your health care provider's instructions. Prenatal care is all the medical care you receive before the baby's birth. This care will help prevent, find, and treat any problems during the pregnancy and childbirth. BODY CHANGES Your body goes through many changes during pregnancy. The changes vary from woman to woman.   You may gain or lose a couple of pounds at first.  You may feel sick to your stomach (nauseous) and throw up (vomit). If the vomiting is uncontrollable, call your health care provider.  You may tire easily.  You may develop headaches that can be relieved by medicines approved by your health care provider.  You may urinate more often. Painful urination may mean you have a bladder infection.  You may develop heartburn as a result of your pregnancy.  You may develop constipation because certain hormones are causing the muscles that push waste through your intestines to slow down.  You may develop hemorrhoids or swollen, bulging veins (varicose veins).  Your breasts may begin to grow larger and become tender. Your nipples may stick out more, and the tissue that surrounds them (areola) may become darker.  Your gums may bleed and may be sensitive to brushing and flossing.  Dark spots or blotches  (chloasma, mask of pregnancy) may develop on your face. This will likely fade after the baby is born.  Your menstrual periods will stop.  You may have a loss of appetite.  You may develop cravings for certain kinds of food.  You may have changes in your emotions from day to day, such as being excited to be pregnant or being concerned that something may go wrong with the pregnancy and baby.  You may have more vivid and strange dreams.  You may have changes in your hair. These can include thickening of your hair, rapid growth, and changes in texture. Some women also have hair loss during or after pregnancy, or hair that feels dry or thin. Your hair will most likely return to normal after your baby is born. WHAT TO EXPECT AT YOUR PRENATAL VISITS During a routine prenatal visit:  You will be weighed to make sure you and the baby are growing normally.  Your blood pressure will be taken.  Your abdomen will be measured to track your baby's growth.  The fetal heartbeat will be listened to starting around week 10 or 12 of your pregnancy.  Test results from any previous visits will be discussed. Your health care provider may ask you:  How you are feeling.  If you are feeling the baby move.  If you have had any abnormal symptoms, such as leaking fluid, bleeding, severe headaches, or abdominal cramping.  If you have any questions. Other   tests that may be performed during your first trimester include:  Blood tests to find your blood type and to check for the presence of any previous infections. They will also be used to check for low iron levels (anemia) and Rh antibodies. Later in the pregnancy, blood tests for diabetes will be done along with other tests if problems develop.  Urine tests to check for infections, diabetes, or protein in the urine.  An ultrasound to confirm the proper growth and development of the baby.  An amniocentesis to check for possible genetic problems.  Fetal  screens for spina bifida and Down syndrome.  You may need other tests to make sure you and the baby are doing well. HOME CARE INSTRUCTIONS  Medicines  Follow your health care provider's instructions regarding medicine use. Specific medicines may be either safe or unsafe to take during pregnancy.  Take your prenatal vitamins as directed.  If you develop constipation, try taking a stool softener if your health care provider approves. Diet  Eat regular, well-balanced meals. Choose a variety of foods, such as meat or vegetable-based protein, fish, milk and low-fat dairy products, vegetables, fruits, and whole grain breads and cereals. Your health care provider will help you determine the amount of weight gain that is right for you.  Avoid raw meat and uncooked cheese. These carry germs that can cause birth defects in the baby.  Eating four or five small meals rather than three large meals a day may help relieve nausea and vomiting. If you start to feel nauseous, eating a few soda crackers can be helpful. Drinking liquids between meals instead of during meals also seems to help nausea and vomiting.  If you develop constipation, eat more high-fiber foods, such as fresh vegetables or fruit and whole grains. Drink enough fluids to keep your urine clear or pale yellow. Activity and Exercise  Exercise only as directed by your health care provider. Exercising will help you:  Control your weight.  Stay in shape.  Be prepared for labor and delivery.  Experiencing pain or cramping in the lower abdomen or low back is a good sign that you should stop exercising. Check with your health care provider before continuing normal exercises.  Try to avoid standing for long periods of time. Move your legs often if you must stand in one place for a long time.  Avoid heavy lifting.  Wear low-heeled shoes, and practice good posture.  You may continue to have sex unless your health care provider directs you  otherwise. Relief of Pain or Discomfort  Wear a good support bra for breast tenderness.   Take warm sitz baths to soothe any pain or discomfort caused by hemorrhoids. Use hemorrhoid cream if your health care provider approves.   Rest with your legs elevated if you have leg cramps or low back pain.  If you develop varicose veins in your legs, wear support hose. Elevate your feet for 15 minutes, 3-4 times a day. Limit salt in your diet. Prenatal Care  Schedule your prenatal visits by the twelfth week of pregnancy. They are usually scheduled monthly at first, then more often in the last 2 months before delivery.  Write down your questions. Take them to your prenatal visits.  Keep all your prenatal visits as directed by your health care provider. Safety  Wear your seat belt at all times when driving.  Make a list of emergency phone numbers, including numbers for family, friends, the hospital, and police and fire departments. General   Tips  Ask your health care provider for a referral to a local prenatal education class. Begin classes no later than at the beginning of month 6 of your pregnancy.  Ask for help if you have counseling or nutritional needs during pregnancy. Your health care provider can offer advice or refer you to specialists for help with various needs.  Do not use hot tubs, steam rooms, or saunas.  Do not douche or use tampons or scented sanitary pads.  Do not cross your legs for long periods of time.  Avoid cat litter boxes and soil used by cats. These carry germs that can cause birth defects in the baby and possibly loss of the fetus by miscarriage or stillbirth.  Avoid all smoking, herbs, alcohol, and medicines not prescribed by your health care provider. Chemicals in these affect the formation and growth of the baby.  Schedule a dentist appointment. At home, brush your teeth with a soft toothbrush and be gentle when you floss. SEEK MEDICAL CARE IF:   You have  dizziness.  You have mild pelvic cramps, pelvic pressure, or nagging pain in the abdominal area.  You have persistent nausea, vomiting, or diarrhea.  You have a bad smelling vaginal discharge.  You have pain with urination.  You notice increased swelling in your face, hands, legs, or ankles. SEEK IMMEDIATE MEDICAL CARE IF:   You have a fever.  You are leaking fluid from your vagina.  You have spotting or bleeding from your vagina.  You have severe abdominal cramping or pain.  You have rapid weight gain or loss.  You vomit blood or material that looks like coffee grounds.  You are exposed to German measles and have never had them.  You are exposed to fifth disease or chickenpox.  You develop a severe headache.  You have shortness of breath.  You have any kind of trauma, such as from a fall or a car accident. Document Released: 07/22/2001 Document Revised: 12/12/2013 Document Reviewed: 06/07/2013 ExitCare Patient Information 2015 [employer]. This information is not intended to replace advice given to you by your health care provider. Make sure you discuss any questions you have with your health care provider.   Nausea & Vomiting  Have saltine crackers or pretzels by your bed and eat a few bites before you raise your head out of bed in the morning  Eat small frequent meals throughout the day instead of large meals  Drink plenty of fluids throughout the day to stay hydrated, just don't drink a lot of fluids with your meals.  This can make your stomach fill up faster making you feel sick  Do not brush your teeth right after you eat  Products with real ginger are good for nausea, like ginger ale and ginger hard candy Make sure it says made with real ginger!  Sucking on sour candy like lemon heads is also good for nausea  If your prenatal vitamins make you nauseated, take them at night so you will sleep through the nausea  Sea Bands  If you feel like you need  medicine for the nausea & vomiting please let us know  If you are unable to keep any fluids or food down please let us know   Constipation  Drink plenty of fluid, preferably water, throughout the day  Eat foods high in fiber such as fruits, vegetables, and grains  Exercise, such as walking, is a good way to keep your bowels regular  Drink warm fluids, especially warm   prune juice, or decaf coffee  Eat a 1/2 cup of real oatmeal (not instant), 1/2 cup applesauce, and 1/2-1 cup warm prune juice every day  If needed, you may take Colace (docusate sodium) stool softener once or twice a day to help keep the stool soft. If you are pregnant, wait until you are out of your first trimester (12-14 weeks of pregnancy)  If you still are having problems with constipation, you may take Miralax once daily as needed to help keep your bowels regular.  If you are pregnant, wait until you are out of your first trimester (12-14 weeks of pregnancy)  Safe Medications in Pregnancy   Acne: Benzoyl Peroxide Salicylic Acid  Backache/Headache: Tylenol: 2 regular strength every 4 hours OR              2 Extra strength every 6 hours  Colds/Coughs/Allergies: Benadryl (alcohol free) 25 mg every 6 hours as needed Breath right strips Claritin Cepacol throat lozenges Chloraseptic throat spray Cold-Eeze- up to three times per day Cough drops, alcohol free Flonase (by prescription only) Guaifenesin Mucinex Robitussin DM (plain only, alcohol free) Saline nasal spray/drops Sudafed (pseudoephedrine) & Actifed ** use only after [redacted] weeks gestation and if you do not have high blood pressure Tylenol Vicks Vaporub Zinc lozenges Zyrtec   Constipation: Colace Ducolax suppositories Fleet enema Glycerin suppositories Metamucil Milk of magnesia Miralax Senokot Smooth move tea  Diarrhea: Kaopectate Imodium A-D  *NO pepto Bismol  Hemorrhoids: Anusol Anusol HC Preparation  H Tucks  Indigestion: Tums Maalox Mylanta Zantac  Pepcid  Insomnia: Benadryl (alcohol free) 25mg every 6 hours as needed Tylenol PM Unisom, no Gelcaps  Leg Cramps: Tums MagGel  Nausea/Vomiting:  Bonine Dramamine Emetrol Ginger extract Sea bands Meclizine  Nausea medication to take during pregnancy:  Unisom (doxylamine succinate 25 mg tablets) Take one tablet daily at bedtime. If symptoms are not adequately controlled, the dose can be increased to a maximum recommended dose of two tablets daily (1/2 tablet in the morning, 1/2 tablet mid-afternoon and one at bedtime). Vitamin B6 100mg tablets. Take one tablet twice a day (up to 200 mg per day).  Skin Rashes: Aveeno products Benadryl cream or 25mg every 6 hours as needed Calamine Lotion 1% cortisone cream  Yeast infection: Gyne-lotrimin 7 Monistat 7   **If taking multiple medications, please check labels to avoid duplicating the same active ingredients **take medication as directed on the label ** Do not exceed 4000 mg of tylenol in 24 hours **Do not take medications that contain aspirin or ibuprofen      
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
## 2017-07-10 LAB — PMP SCREEN PROFILE (10S), URINE
Amphetamine Scrn, Ur: NEGATIVE ng/mL
BARBITURATE SCREEN URINE: NEGATIVE ng/mL
BENZODIAZEPINE SCREEN, URINE: NEGATIVE ng/mL
CANNABINOIDS UR QL SCN: NEGATIVE ng/mL
Cocaine (Metab) Scrn, Ur: NEGATIVE ng/mL
Creatinine(Crt), U: 209.1 mg/dL (ref 20.0–300.0)
Methadone Screen, Urine: NEGATIVE ng/mL
OXYCODONE+OXYMORPHONE UR QL SCN: NEGATIVE ng/mL
Opiate Scrn, Ur: NEGATIVE ng/mL
Ph of Urine: 6.1 (ref 4.5–8.9)
Phencyclidine Qn, Ur: NEGATIVE ng/mL
Propoxyphene Scrn, Ur: NEGATIVE ng/mL

## 2017-07-10 LAB — GC/CHLAMYDIA PROBE AMP
Chlamydia trachomatis, NAA: NEGATIVE
Neisseria gonorrhoeae by PCR: NEGATIVE

## 2017-07-11 LAB — URINE CULTURE

## 2017-07-15 ENCOUNTER — Telehealth: Payer: Self-pay | Admitting: Obstetrics & Gynecology

## 2017-07-15 LAB — URINALYSIS, ROUTINE W REFLEX MICROSCOPIC
Bilirubin, UA: NEGATIVE
Glucose, UA: NEGATIVE
Ketones, UA: NEGATIVE
Leukocytes, UA: NEGATIVE
Nitrite, UA: NEGATIVE
Protein, UA: NEGATIVE
RBC, UA: NEGATIVE
Specific Gravity, UA: 1.028 (ref 1.005–1.030)
Urobilinogen, Ur: 0.2 mg/dL (ref 0.2–1.0)
pH, UA: 6 (ref 5.0–7.5)

## 2017-07-15 LAB — HIV ANTIBODY (ROUTINE TESTING W REFLEX): HIV Screen 4th Generation wRfx: NONREACTIVE

## 2017-07-15 LAB — CBC
Hematocrit: 39.8 % (ref 34.0–46.6)
Hemoglobin: 13.8 g/dL (ref 11.1–15.9)
MCH: 29.7 pg (ref 26.6–33.0)
MCHC: 34.7 g/dL (ref 31.5–35.7)
MCV: 86 fL (ref 79–97)
Platelets: 264 10*3/uL (ref 150–379)
RBC: 4.65 x10E6/uL (ref 3.77–5.28)
RDW: 14.1 % (ref 12.3–15.4)
WBC: 8.5 10*3/uL (ref 3.4–10.8)

## 2017-07-15 LAB — AB SCR+ANTIBODY ID

## 2017-07-15 LAB — ANTIBODY SCREEN

## 2017-07-15 LAB — RPR: RPR Ser Ql: NONREACTIVE

## 2017-07-15 LAB — VARICELLA ZOSTER ANTIBODY, IGG: Varicella zoster IgG: 1061 index (ref >165)

## 2017-07-15 LAB — HEPATITIS B SURFACE ANTIGEN: Hepatitis B Surface Ag: NEGATIVE

## 2017-07-15 NOTE — Telephone Encounter (Signed)
Informed patient all tests at last visit were normal. Patient asked if down syndrome test was negative and informed patient she has not had her nt/it so results would not be back for that until after her second blood draw after 16-17 weeks. Pt verbalized understanding.

## 2017-08-03 ENCOUNTER — Encounter (HOSPITAL_COMMUNITY): Payer: Self-pay | Admitting: Emergency Medicine

## 2017-08-03 ENCOUNTER — Emergency Department (HOSPITAL_COMMUNITY)
Admission: EM | Admit: 2017-08-03 | Discharge: 2017-08-04 | Disposition: A | Discharge status: Home / Self Care | Payer: Medicaid Other | Attending: Emergency Medicine | Admitting: Emergency Medicine

## 2017-08-03 ENCOUNTER — Other Ambulatory Visit: Payer: Self-pay

## 2017-08-03 DIAGNOSIS — J3489 Other specified disorders of nose and nasal sinuses: Secondary | ICD-10-CM | POA: Insufficient documentation

## 2017-08-03 DIAGNOSIS — R0981 Nasal congestion: Secondary | ICD-10-CM | POA: Insufficient documentation

## 2017-08-03 DIAGNOSIS — J020 Streptococcal pharyngitis: Secondary | ICD-10-CM | POA: Insufficient documentation

## 2017-08-03 DIAGNOSIS — Z3A11 11 weeks gestation of pregnancy: Secondary | ICD-10-CM | POA: Diagnosis not present

## 2017-08-03 DIAGNOSIS — R05 Cough: Secondary | ICD-10-CM

## 2017-08-03 DIAGNOSIS — Z79899 Other long term (current) drug therapy: Secondary | ICD-10-CM | POA: Insufficient documentation

## 2017-08-03 DIAGNOSIS — O99511 Diseases of the respiratory system complicating pregnancy, first trimester: Secondary | ICD-10-CM | POA: Insufficient documentation

## 2017-08-03 DIAGNOSIS — R059 Cough, unspecified: Secondary | ICD-10-CM

## 2017-08-03 DIAGNOSIS — Z87891 Personal history of nicotine dependence: Secondary | ICD-10-CM | POA: Insufficient documentation

## 2017-08-03 LAB — RAPID STREP SCREEN (MED CTR MEBANE ONLY): Streptococcus, Group A Screen (Direct): POSITIVE — AB

## 2017-08-03 MED ORDER — DIPHENHYDRAMINE HCL 25 MG PO CAPS
50.0000 mg | ORAL_CAPSULE | Freq: Once | ORAL | Status: AC
  Administered 2017-08-03: 50 mg via ORAL
  Filled 2017-08-03: qty 2

## 2017-08-03 MED ORDER — GUAIFENESIN-DM 100-10 MG/5ML PO SYRP
10.0000 mL | ORAL_SOLUTION | Freq: Once | ORAL | Status: AC
  Administered 2017-08-03: 10 mL via ORAL
  Filled 2017-08-03: qty 10

## 2017-08-03 NOTE — ED Triage Notes (Signed)
Pt s pregnant  [redacted] weeks  Cough x 2 days No flu shot  No humidifier No OTC meds

## 2017-08-04 MED ORDER — ACETAMINOPHEN 500 MG PO TABS
1000.0000 mg | ORAL_TABLET | Freq: Once | ORAL | Status: AC
  Administered 2017-08-04: 1000 mg via ORAL
  Filled 2017-08-04: qty 2

## 2017-08-04 MED ORDER — DEXTROMETHORPHAN-GUAIFENESIN 5-100 MG/5ML PO LIQD: 10.0000 mL | ORAL | 0 refills | Status: DC | PRN

## 2017-08-04 MED ORDER — PENICILLIN G BENZATHINE 1200000 UNIT/2ML IM SUSP
1.2000 10*6.[IU] | Freq: Once | INTRAMUSCULAR | Status: AC
  Administered 2017-08-04: 1.2 10*6.[IU] via INTRAMUSCULAR
  Filled 2017-08-04: qty 2

## 2017-08-04 NOTE — Discharge Instructions (Signed)
You may use the medicine prescribed for cough. Additionally I recommend tylenol for throat pain. Rest and make sure you are drinking plenty of fluids.

## 2017-08-04 NOTE — ED Notes (Signed)
Pt flagged for DC Awaiting paperwork
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
## 2017-08-04 NOTE — ED Notes (Signed)
Pt request snacks and soda Jessica DittoGraham cracker diet shasta provided for pt and friends

## 2017-08-07 ENCOUNTER — Other Ambulatory Visit (INDEPENDENT_AMBULATORY_CARE_PROVIDER_SITE_OTHER): Payer: Medicaid Other

## 2017-08-07 ENCOUNTER — Telehealth: Payer: Self-pay | Admitting: Women's Health

## 2017-08-07 DIAGNOSIS — R300 Dysuria: Secondary | ICD-10-CM

## 2017-08-07 LAB — POCT URINALYSIS DIPSTICK
Blood, UA: NEGATIVE
Glucose, UA: NEGATIVE
Ketones, UA: NEGATIVE
Leukocytes, UA: NEGATIVE
Nitrite, UA: NEGATIVE
Protein, UA: NEGATIVE

## 2017-08-07 NOTE — Telephone Encounter (Signed)
Pt called stating that she has a bad UTI and needs to see a provider, I let pt know we don't have any opening but she would like to speak with a nurse. Please contact pt

## 2017-08-07 NOTE — Telephone Encounter (Signed)
Patient called with complaints of burning with urination and pain. She has been experiencing this for a while now. Advised patient to come to our office to leave urine sample and we can dip then as well.  Stated she would.

## 2017-08-09 LAB — URINE CULTURE: Organism ID, Bacteria: NO GROWTH

## 2017-08-10 ENCOUNTER — Ambulatory Visit (INDEPENDENT_AMBULATORY_CARE_PROVIDER_SITE_OTHER): Payer: Medicaid Other

## 2017-08-10 ENCOUNTER — Encounter: Payer: Self-pay | Admitting: Women's Health

## 2017-08-10 ENCOUNTER — Ambulatory Visit (INDEPENDENT_AMBULATORY_CARE_PROVIDER_SITE_OTHER): Payer: Medicaid Other | Admitting: Women's Health

## 2017-08-10 VITALS — BP 104/62 | HR 77 | Wt 139.0 lb

## 2017-08-10 DIAGNOSIS — Z3682 Encounter for antenatal screening for nuchal translucency: Secondary | ICD-10-CM

## 2017-08-10 DIAGNOSIS — Z1389 Encounter for screening for other disorder: Secondary | ICD-10-CM

## 2017-08-10 DIAGNOSIS — Z3481 Encounter for supervision of other normal pregnancy, first trimester: Secondary | ICD-10-CM

## 2017-08-10 DIAGNOSIS — Z331 Pregnant state, incidental: Secondary | ICD-10-CM

## 2017-08-10 DIAGNOSIS — Z3A12 12 weeks gestation of pregnancy: Secondary | ICD-10-CM

## 2017-08-10 DIAGNOSIS — Z8759 Personal history of other complications of pregnancy, childbirth and the puerperium: Secondary | ICD-10-CM

## 2017-08-10 LAB — POCT URINALYSIS DIPSTICK
Blood, UA: NEGATIVE
Glucose, UA: NEGATIVE
Ketones, UA: NEGATIVE
Nitrite, UA: NEGATIVE
Protein, UA: NEGATIVE

## 2017-08-10 NOTE — Progress Notes (Signed)
   LOW-RISK PREGNANCY VISIT Patient name: Jessica [name] MRN [id number]  Date of birth: 06/15/1991 Chief Complaint:   Routine Prenatal Visit (NT today)  History of Present Illness:   Jessica [name] is a 26 y.o. 272P1001 female at 6049w3d with an Estimated Date of Delivery: 02/19/18 being seen today for ongoing management of a low-risk pregnancy.  Today she reports no complaints. H/O GHTN, hasn't started baby ASA. Vag. Bleeding: None.  Movement: Absent. denies leaking of fluid. Review of Systems:   Pertinent items are noted in HPI Denies abnormal vaginal discharge w/ itching/odor/irritation, headaches, visual changes, shortness of breath, chest pain, abdominal pain, severe nausea/vomiting, or problems with urination or bowel movements unless otherwise stated above. Pertinent History Reviewed:  Reviewed past medical,surgical, social, obstetrical and family history.  Reviewed problem list, medications and allergies. Physical Assessment:   Vitals:   08/10/17 1041  BP: 104/62  Pulse: 77  Weight: 139 lb (63 kg)  Body mass index is 25.42 kg/m.        Physical Examination:   General appearance: Well appearing, and in no distress  Mental status: Alert, oriented to person, place, and time  Skin: Warm & dry  Cardiovascular: Normal heart rate noted  Respiratory: Normal respiratory effort, no distress  Abdomen: Soft, gravid, nontender  Pelvic: Cervical exam deferred         Extremities: Edema: None  Fetal Status: Fetal Heart Rate (bpm): 149 u/s   Movement: Absent    NT US 12+3 wks,measurements c/w dates,normal ovaries bilat,fhr 149 bpm,crl 60.27 mm,NB present,NT 1.7 mm,anterior pl gr 0   Results for orders placed or performed in visit on 08/10/17 (from the past 24 hour(s))  POCT urinalysis dipstick   Collection Time: 08/10/17 10:42 AM  Result Value Ref Range   Color, UA     Clarity, UA     Glucose, UA neg    Bilirubin, UA     Ketones, UA neg    Spec Grav, UA  1.010 - 1.025   Blood, UA  neg    pH, UA  5.0 - 8.0   Protein, UA neg    Urobilinogen, UA  0.2 or 1.0 E.U./dL   Nitrite, UA neg    Leukocytes, UA Small (1+) (A) Negative   Appearance     Odor      Assessment & Plan:  1) Low-risk pregnancy [name] at 6649w3d with an Estimated Date of Delivery: 02/19/18   2) H/O GHTN, start daily baby ASA   Labs/procedures today: 1st IT/NT  Plan:  Continue routine obstetrical care   Reviewed: Preterm labor symptoms and general obstetric precautions including but not limited to vaginal bleeding, contractions, leaking of fluid and fetal movement were reviewed in detail with the patient.  All questions were answered  Follow-up: Return in about 4 weeks (around 09/07/2017) for LROB, 2nd IT.  Orders Placed This Encounter  Procedures  . Integrated 1  . POCT urinalysis dipstick   Marge DuncansBooker, Lasha Echeverria Randall CNM, [facility] 08/10/2017 10:56 AM

## 2017-08-10 NOTE — Patient Instructions (Addendum)
Jessica [name], I greatly value your feedback.  If you receive a survey following your visit with us today, we appreciate you taking the time to fill it out.  Thanks, Joellyn HaffKim Hulda Reddix, CNM, WHNP-BC  Begin taking a 81mg  baby aspirin daily to decrease risk of preeclampsia during pregnancy    Second Trimester of Pregnancy The second trimester is from week 14 through week 27 (months 4 through 6). The second trimester is often a time when you feel your best. Your body has adjusted to being pregnant, and you begin to feel better physically. Usually, morning sickness has lessened or quit completely, you may have more energy, and you may have an increase in appetite. The second trimester is also a time when the fetus is growing rapidly. At the end of the sixth month, the fetus is about 9 inches long and weighs about 1 pounds. You will likely begin to feel the baby move (quickening) between 16 and 20 weeks of pregnancy. Body changes during your second trimester Your body continues to go through many changes during your second trimester. The changes vary from woman to woman.  Your weight will continue to increase. You will notice your lower abdomen bulging out.  You may begin to get stretch marks on your hips, abdomen, and breasts.  You may develop headaches that can be relieved by medicines. The medicines should be approved by your health care provider.  You may urinate more often because the fetus is pressing on your bladder.  You may develop or continue to have heartburn as a result of your pregnancy.  You may develop constipation because certain hormones are causing the muscles that push waste through your intestines to slow down.  You may develop hemorrhoids or swollen, bulging veins (varicose veins).  You may have back pain. This is caused by: ? Weight gain. ? Pregnancy hormones that are relaxing the joints in your pelvis. ? A shift in weight and the muscles that support your balance.  Your  breasts will continue to grow and they will continue to become tender.  Your gums may bleed and may be sensitive to brushing and flossing.  Dark spots or blotches (chloasma, mask of pregnancy) may develop on your face. This will likely fade after the baby is born.  A dark line from your belly button to the pubic area (linea nigra) may appear. This will likely fade after the baby is born.  You may have changes in your hair. These can include thickening of your hair, rapid growth, and changes in texture. Some women also have hair loss during or after pregnancy, or hair that feels dry or thin. Your hair will most likely return to normal after your baby is born.  What to expect at prenatal visits During a routine prenatal visit:  You will be weighed to make sure you and the fetus are growing normally.  Your blood pressure will be taken.  Your abdomen will be measured to track your baby's growth.  The fetal heartbeat will be listened to.  Any test results from the previous visit will be discussed.  Your health care provider may ask you:  How you are feeling.  If you are feeling the baby move.  If you have had any abnormal symptoms, such as leaking fluid, bleeding, severe headaches, or abdominal cramping.  If you are using any tobacco products, including cigarettes, chewing tobacco, and electronic cigarettes.  If you have any questions.  Other tests that may be performed during your  second trimester include:  Blood tests that check for: ? Low iron levels (anemia). ? High blood sugar that affects pregnant women (gestational diabetes) between 12 and 28 weeks. ? Rh antibodies. This is to check for a protein on red blood cells (Rh factor).  Urine tests to check for infections, diabetes, or protein in the urine.  An ultrasound to confirm the proper growth and development of the baby.  An amniocentesis to check for possible genetic problems.  Fetal screens for spina bifida and  Down syndrome.  HIV (human immunodeficiency virus) testing. Routine prenatal testing includes screening for HIV, unless you choose not to have this test.  Follow these instructions at home: Medicines  Follow your health care provider's instructions regarding medicine use. Specific medicines may be either safe or unsafe to take during pregnancy.  Take a prenatal vitamin that contains at least 600 micrograms (mcg) of folic acid.  If you develop constipation, try taking a stool softener if your health care provider approves. Eating and drinking  Eat a balanced diet that includes fresh fruits and vegetables, whole grains, good sources of protein such as meat, eggs, or tofu, and low-fat dairy. Your health care provider will help you determine the amount of weight gain that is right for you.  Avoid raw meat and uncooked cheese. These carry germs that can cause birth defects in the baby.  If you have low calcium intake from food, talk to your health care provider about whether you should take a daily calcium supplement.  Limit foods that are high in fat and processed sugars, such as fried and sweet foods.  To prevent constipation: ? Drink enough fluid to keep your urine clear or pale yellow. ? Eat foods that are high in fiber, such as fresh fruits and vegetables, whole grains, and beans. Activity  Exercise only as directed by your health care provider. Most women can continue their usual exercise routine during pregnancy. Try to exercise for 30 minutes at least 5 days a week. Stop exercising if you experience uterine contractions.  Avoid heavy lifting, wear low heel shoes, and practice good posture.  A sexual relationship may be continued unless your health care provider directs you otherwise. Relieving pain and discomfort  Wear a good support bra to prevent discomfort from breast tenderness.  Take warm sitz baths to soothe any pain or discomfort caused by hemorrhoids. Use hemorrhoid  cream if your health care provider approves.  Rest with your legs elevated if you have leg cramps or low back pain.  If you develop varicose veins, wear support hose. Elevate your feet for 15 minutes, 3-4 times a day. Limit salt in your diet. Prenatal Care  Write down your questions. Take them to your prenatal visits.  Keep all your prenatal visits as told by your health care provider. This is important. Safety  Wear your seat belt at all times when driving.  Make a list of emergency phone numbers, including numbers for family, friends, the hospital, and police and fire departments. General instructions  Ask your health care provider for a referral to a local prenatal education class. Begin classes no later than the beginning of month 6 of your pregnancy.  Ask for help if you have counseling or nutritional needs during pregnancy. Your health care provider can offer advice or refer you to specialists for help with various needs.  Do not use hot tubs, steam rooms, or saunas.  Do not douche or use tampons or scented sanitary pads.  Do  not cross your legs for long periods of time.  Avoid cat litter boxes and soil used by cats. These carry germs that can cause birth defects in the baby and possibly loss of the fetus by miscarriage or stillbirth.  Avoid all smoking, herbs, alcohol, and unprescribed drugs. Chemicals in these products can affect the formation and growth of the baby.  Do not use any products that contain nicotine or tobacco, such as cigarettes and e-cigarettes. If you need help quitting, ask your health care provider.  Visit your dentist if you have not gone yet during your pregnancy. Use a soft toothbrush to brush your teeth and be gentle when you floss. Contact a health care provider if:  You have dizziness.  You have mild pelvic cramps, pelvic pressure, or nagging pain in the abdominal area.  You have persistent nausea, vomiting, or diarrhea.  You have a bad  smelling vaginal discharge.  You have pain when you urinate. Get help right away if:  You have a fever.  You are leaking fluid from your vagina.  You have spotting or bleeding from your vagina.  You have severe abdominal cramping or pain.  You have rapid weight gain or weight loss.  You have shortness of breath with chest pain.  You notice sudden or extreme swelling of your face, hands, ankles, feet, or legs.  You have not felt your baby move in over an hour.  You have severe headaches that do not go away when you take medicine.  You have vision changes. Summary  The second trimester is from week 14 through week 27 (months 4 through 6). It is also a time when the fetus is growing rapidly.  Your body goes through many changes during pregnancy. The changes vary from woman to woman.  Avoid all smoking, herbs, alcohol, and unprescribed drugs. These chemicals affect the formation and growth your baby.  Do not use any tobacco products, such as cigarettes, chewing tobacco, and e-cigarettes. If you need help quitting, ask your health care provider.  Contact your health care provider if you have any questions. Keep all prenatal visits as told by your health care provider. This is important. This information is not intended to replace advice given to you by your health care provider. Make sure you discuss any questions you have with your health care provider. Document Released: 07/22/2001 Document Revised: 01/03/2016 Document Reviewed: 09/28/2012 Elsevier Interactive Patient Education  2017 [employer].

## 2017-08-10 NOTE — Progress Notes (Signed)
US 12+3 wks,measurements c/w dates,normal ovaries bilat,fhr 149 bpm,crl 60.27 mm,NB present,NT 1.7 mm,anterior pl gr 0

## 2017-08-12 ENCOUNTER — Telehealth: Payer: Self-pay | Admitting: Women's Health

## 2017-08-12 NOTE — Telephone Encounter (Signed)
Informed patient results of genetic testing would not be back until after her second blood draw after 16 weeks.  Verbalized understanding.

## 2017-08-13 LAB — INTEGRATED 1
Crown Rump Length: 60.3 mm
Gest. Age on Collection Date: 12.4 weeks
Maternal Age at EDD: 24.9 yr
Nuchal Translucency (NT): 1.7 mm
Number of Fetuses: 1
PAPP-A Value: 437.6 ng/mL
Weight: 139 [lb_av]

## 2017-08-18 ENCOUNTER — Telehealth: Payer: Self-pay | Admitting: Obstetrics & Gynecology

## 2017-09-07 ENCOUNTER — Ambulatory Visit (INDEPENDENT_AMBULATORY_CARE_PROVIDER_SITE_OTHER): Payer: Medicaid Other | Admitting: Obstetrics and Gynecology

## 2017-09-07 VITALS — BP 102/66 | HR 88 | Wt 144.6 lb

## 2017-09-07 DIAGNOSIS — Z3A16 16 weeks gestation of pregnancy: Secondary | ICD-10-CM

## 2017-09-07 DIAGNOSIS — Z1379 Encounter for other screening for genetic and chromosomal anomalies: Secondary | ICD-10-CM

## 2017-09-07 DIAGNOSIS — Z3482 Encounter for supervision of other normal pregnancy, second trimester: Secondary | ICD-10-CM

## 2017-09-07 DIAGNOSIS — Z331 Pregnant state, incidental: Secondary | ICD-10-CM

## 2017-09-07 DIAGNOSIS — Z1389 Encounter for screening for other disorder: Secondary | ICD-10-CM

## 2017-09-07 LAB — POCT URINALYSIS DIPSTICK
Blood, UA: NEGATIVE
Glucose, UA: NEGATIVE
Ketones, UA: NEGATIVE
Leukocytes, UA: NEGATIVE
Nitrite, UA: NEGATIVE
Protein, UA: NEGATIVE

## 2017-09-07 NOTE — Progress Notes (Addendum)
Patient ID: Jessica [name], female   DOB: 08/20/1990, 27 y.o.   MRN: [id number]   LOW-RISK PREGNANCY VISIT Patient name: Jessica [name] MRN [id number]  Date of birth: 04/06/1991 Chief Complaint:   Routine Prenatal Visit (IT2)  History of Present Illness:   Jessica [name] is a 27 y.o. 242P1001 female at 6264w3d with an Estimated Date of Delivery: 02/19/18 being seen today for ongoing management of a low-risk pregnancy. And second IT test collection Today she reports no complaints. Contractions: Not present. Vag. Bleeding: None.  Movement: Absent. denies leaking of fluid. Review of Systems:   Pertinent items are noted in HPI Denies abnormal vaginal discharge w/ itching/odor/irritation, headaches, visual changes, shortness of breath, chest pain, abdominal pain, severe nausea/vomiting, or problems with urination or bowel movements unless otherwise stated above. Pertinent History Reviewed:  Reviewed past medical,surgical, social, obstetrical and family history.  Reviewed problem list, medications and allergies. Physical Assessment:   Vitals:   09/07/17 1008  BP: 102/66  Pulse: 88  Weight: 144 lb 9.6 oz (65.6 kg)  Body mass index is 26.45 kg/m.        Physical Examination:   General appearance: Well appearing, and in no distress  Mental status: Alert, oriented to person, place, and time  Skin: Warm & dry  Cardiovascular: Normal heart rate noted  Respiratory: Normal respiratory effort, no distress  Abdomen: Soft, gravid, nontender  Pelvic: Cervical exam deferred         Extremities: Edema: None  Fetal Status: Fetal Heart Rate (bpm): 156 Fundal Height: 17 cm Movement: Absent    Results for orders placed or performed in visit on 09/07/17 (from the past 24 hour(s))  POCT Urinalysis Dipstick   Collection Time: 09/07/17 10:08 AM  Result Value Ref Range   Color, UA     Clarity, UA     Glucose, UA Negative    Bilirubin, UA     Ketones, UA Negative    Spec Grav, UA  1.010 - 1.025   Blood,  UA Negative    pH, UA  5.0 - 8.0   Protein, UA Negative    Urobilinogen, UA  0.2 or 1.0 E.U./dL   Nitrite, UA Negative    Leukocytes, UA Negative Negative   Appearance     Odor      Assessment & Plan:  1) Low-risk pregnancy [name] at 1964w3d with an Estimated Date of Delivery: 02/19/18    Meds: No orders of the defined types were placed in this encounter.  Labs/procedures today: 2nd IT  Plan:  Continue routine obstetrical care   Follow-up: Return in about 3 weeks (around 09/28/2017) for LROB, U/S: ANATOMY.  Orders Placed This Encounter  Procedures  . INTEGRATED 2  . POCT Urinalysis Dipstick   By signing my name below, I, Diona BrownerJennifer Gorman, attest that this documentation has been prepared under the direction and in the presence of Tilda BurrowFerguson, Lil Lepage V, MD. Electronically Signed: Diona BrownerJennifer Gorman, Medical Scribe. 09/07/17. 10:29 AM.  I personally performed the services described in this documentation, which was SCRIBED in my presence. The recorded information has been reviewed and considered accurate. It has been edited as necessary during review. Tilda BurrowJohn V Greely Atiyeh, MD

## 2017-09-09 LAB — INTEGRATED 2
AFP MoM: 1.3
Alpha-Fetoprotein: 43.5 ng/mL
Crown Rump Length: 60.3 mm
DIA MoM: 1.5
DIA Value: 265.5 pg/mL
Estriol, Unconjugated: 1.24 ng/mL
Gest. Age on Collection Date: 12.4 weeks
Gestational Age: 16.4 weeks
Maternal Age at EDD: 24.9 yr
Nuchal Translucency (NT): 1.7 mm
Nuchal Translucency MoM: 1.11
Number of Fetuses: 1
PAPP-A MoM: 0.41
PAPP-A Value: 437.6 ng/mL
Test Results:: NEGATIVE
Weight: 139 [lb_av]
Weight: 139 [lb_av]
hCG MoM: 1.31
hCG Value: 42.7 IU/mL
uE3 MoM: 1.41

## 2017-09-18 ENCOUNTER — Telehealth: Payer: Self-pay | Admitting: Obstetrics & Gynecology

## 2017-09-18 NOTE — Telephone Encounter (Signed)
Informed patient screening was negative.

## 2017-09-25 ENCOUNTER — Other Ambulatory Visit: Payer: Self-pay | Admitting: Obstetrics and Gynecology

## 2017-09-25 DIAGNOSIS — Z363 Encounter for antenatal screening for malformations: Secondary | ICD-10-CM

## 2017-09-28 ENCOUNTER — Ambulatory Visit (INDEPENDENT_AMBULATORY_CARE_PROVIDER_SITE_OTHER): Payer: Medicaid Other

## 2017-09-28 ENCOUNTER — Other Ambulatory Visit: Payer: Self-pay

## 2017-09-28 ENCOUNTER — Encounter: Payer: Self-pay | Admitting: Women's Health

## 2017-09-28 ENCOUNTER — Ambulatory Visit (INDEPENDENT_AMBULATORY_CARE_PROVIDER_SITE_OTHER): Payer: Medicaid Other | Admitting: Women's Health

## 2017-09-28 VITALS — BP 118/76 | HR 83 | Wt 146.0 lb

## 2017-09-28 DIAGNOSIS — Z3A19 19 weeks gestation of pregnancy: Secondary | ICD-10-CM

## 2017-09-28 DIAGNOSIS — Z1389 Encounter for screening for other disorder: Secondary | ICD-10-CM

## 2017-09-28 DIAGNOSIS — Z331 Pregnant state, incidental: Secondary | ICD-10-CM

## 2017-09-28 DIAGNOSIS — Z98891 History of uterine scar from previous surgery: Secondary | ICD-10-CM

## 2017-09-28 DIAGNOSIS — Z8759 Personal history of other complications of pregnancy, childbirth and the puerperium: Secondary | ICD-10-CM

## 2017-09-28 DIAGNOSIS — Z363 Encounter for antenatal screening for malformations: Secondary | ICD-10-CM | POA: Diagnosis not present

## 2017-09-28 DIAGNOSIS — F419 Anxiety disorder, unspecified: Secondary | ICD-10-CM

## 2017-09-28 DIAGNOSIS — O34219 Maternal care for unspecified type scar from previous cesarean delivery: Secondary | ICD-10-CM

## 2017-09-28 DIAGNOSIS — Z3402 Encounter for supervision of normal first pregnancy, second trimester: Secondary | ICD-10-CM

## 2017-09-28 DIAGNOSIS — Z3482 Encounter for supervision of other normal pregnancy, second trimester: Secondary | ICD-10-CM

## 2017-09-28 LAB — POCT URINALYSIS DIPSTICK
Blood, UA: NEGATIVE
Glucose, UA: NEGATIVE
Ketones, UA: NEGATIVE
Leukocytes, UA: NEGATIVE
Nitrite, UA: NEGATIVE
Protein, UA: NEGATIVE

## 2017-09-28 NOTE — Progress Notes (Signed)
US 19+3 wks,cephalic,cx 3.8 cm, anterior pl gr 0,svp of fluid 5.6 cm,normal ovaries bilat,fhr 144 bpm,EFW 308 g 61%,anatomy complete,no obvious abnormalities

## 2017-09-28 NOTE — Progress Notes (Signed)
   LOW-RISK PREGNANCY VISIT Patient name: Jessica QuailRachel N Therrell MRN [id number]  Date of birth: 03/30/1991 Chief Complaint:   Routine Prenatal Visit (u/s today)  History of Present Illness:   Jessica [name] is a 27 y.o. 922P1001 female at 5529w3d with an Estimated Date of Delivery: 02/19/18 being seen today for ongoing management of a low-risk pregnancy.  Today she reports no complaints. Contractions: Not present. Vag. Bleeding: None.  Movement: Present. denies leaking of fluid. Review of Systems:   Pertinent items are noted in HPI Denies abnormal vaginal discharge w/ itching/odor/irritation, headaches, visual changes, shortness of breath, chest pain, abdominal pain, severe nausea/vomiting, or problems with urination or bowel movements unless otherwise stated above. Pertinent History Reviewed:  Reviewed past medical,surgical, social, obstetrical and family history.  Reviewed problem list, medications and allergies. Physical Assessment:   Vitals:   09/28/17 1347  BP: 118/76  Pulse: 83  Weight: 146 lb (66.2 kg)  Body mass index is 26.7 kg/m.        Physical Examination:   General appearance: Well appearing, and in no distress  Mental status: Alert, oriented to person, place, and time  Skin: Warm & dry  Cardiovascular: Normal heart rate noted  Respiratory: Normal respiratory effort, no distress  Abdomen: Soft, gravid, nontender  Pelvic: Cervical exam deferred         Extremities: Edema: None  Fetal Status: Fetal Heart Rate (bpm): 144 u/s   Movement: Present   US 19+3 wks,cephalic,cx 3.8 cm, anterior pl gr 0,svp of fluid 5.6 cm,normal ovaries bilat,fhr 144 bpm,EFW 308 g 61%,anatomy complete,no obvious abnormalities    Results for orders placed or performed in visit on 09/28/17 (from the past 24 hour(s))  POCT urinalysis dipstick   Collection Time: 09/28/17  1:48 PM  Result Value Ref Range   Color, UA     Clarity, UA     Glucose, UA neg    Bilirubin, UA     Ketones, UA neg    Spec Grav,  UA  1.010 - 1.025   Blood, UA neg    pH, UA  5.0 - 8.0   Protein, UA neg    Urobilinogen, UA  0.2 or 1.0 E.U./dL   Nitrite, UA neg    Leukocytes, UA Negative Negative   Appearance     Odor      Assessment & Plan:  1) Low-risk pregnancy [name] at 4529w3d with an Estimated Date of Delivery: 02/19/18   2) Prev c/s, wants RCS   Meds: No orders of the defined types were placed in this encounter.  Labs/procedures today: anatomy u/s  Plan:  Continue routine obstetrical care   Reviewed: Preterm labor symptoms and general obstetric precautions including but not limited to vaginal bleeding, contractions, leaking of fluid and fetal movement were reviewed in detail with the patient.  All questions were answered  Follow-up: Return in about 4 weeks (around 10/26/2017) for LROB.  Orders Placed This Encounter  Procedures  . POCT urinalysis dipstick   Cheral MarkerKimberly R Ronisha Herringshaw CNM, [facility]NP[facility]BC 09/28/2017 2:13 PM

## 2017-09-28 NOTE — Patient Instructions (Signed)
Jessica [name], I greatly value your feedback.  If you receive a survey following your visit with [place] today, we appreciate you taking the time to fill it out.  Thanks, Joellyn Haff, CNM, WHNP-BC   Second Trimester of Pregnancy The second trimester is from week 14 through week 27 (months 4 through 6). The second trimester is often a time when you feel your best. Your body has adjusted to being pregnant, and you begin to feel better physically. Usually, morning sickness has lessened or quit completely, you may have more energy, and you may have an increase in appetite. The second trimester is also a time when the fetus is growing rapidly. At the end of the sixth month, the fetus is about 9 inches long and weighs about 1 pounds. You will likely begin to feel the baby move (quickening) between 16 and 20 weeks of pregnancy. Body changes during your second trimester Your body continues to go through many changes during your second trimester. The changes vary from woman to woman.  Your weight will continue to increase. You will notice your lower abdomen bulging out.  You may begin to get stretch marks on your hips, abdomen, and breasts.  You may develop headaches that can be relieved by medicines. The medicines should be approved by your health care provider.  You may urinate more often because the fetus is pressing on your bladder.  You may develop or continue to have heartburn as a result of your pregnancy.  You may develop constipation because certain hormones are causing the muscles that push waste through your intestines to slow down.  You may develop hemorrhoids or swollen, bulging veins (varicose veins).  You may have back pain. This is caused by: ? Weight gain. ? Pregnancy hormones that are relaxing the joints in your pelvis. ? A shift in weight and the muscles that support your balance.  Your breasts will continue to grow and they will continue to become tender.  Your gums may bleed  and may be sensitive to brushing and flossing.  Dark spots or blotches (chloasma, mask of pregnancy) may develop on your face. This will likely fade after the baby is born.  A dark line from your belly button to the pubic area (linea nigra) may appear. This will likely fade after the baby is born.  You may have changes in your hair. These can include thickening of your hair, rapid growth, and changes in texture. Some women also have hair loss during or after pregnancy, or hair that feels dry or thin. Your hair will most likely return to normal after your baby is born.  What to expect at prenatal visits During a routine prenatal visit:  You will be weighed to make sure you and the fetus are growing normally.  Your blood pressure will be taken.  Your abdomen will be measured to track your baby's growth.  The fetal heartbeat will be listened to.  Any test results from the previous visit will be discussed.  Your health care provider may ask you:  How you are feeling.  If you are feeling the baby move.  If you have had any abnormal symptoms, such as leaking fluid, bleeding, severe headaches, or abdominal cramping.  If you are using any tobacco products, including cigarettes, chewing tobacco, and electronic cigarettes.  If you have any questions.  Other tests that may be performed during your second trimester include:  Blood tests that check for: ? Low iron levels (anemia). ? High  blood sugar that affects pregnant women (gestational diabetes) between 3 and 28 weeks. ? Rh antibodies. This is to check for a protein on red blood cells (Rh factor).  Urine tests to check for infections, diabetes, or protein in the urine.  An ultrasound to confirm the proper growth and development of the baby.  An amniocentesis to check for possible genetic problems.  Fetal screens for spina bifida and Down syndrome.  HIV (human immunodeficiency virus) testing. Routine prenatal testing includes  screening for HIV, unless you choose not to have this test.  Follow these instructions at home: Medicines  Follow your health care provider's instructions regarding medicine use. Specific medicines may be either safe or unsafe to take during pregnancy.  Take a prenatal vitamin that contains at least 600 micrograms (mcg) of folic acid.  If you develop constipation, try taking a stool softener if your health care provider approves. Eating and drinking  Eat a balanced diet that includes fresh fruits and vegetables, whole grains, good sources of protein such as meat, eggs, or tofu, and low-fat dairy. Your health care provider will help you determine the amount of weight gain that is right for you.  Avoid raw meat and uncooked cheese. These carry germs that can cause birth defects in the baby.  If you have low calcium intake from food, talk to your health care provider about whether you should take a daily calcium supplement.  Limit foods that are high in fat and processed sugars, such as fried and sweet foods.  To prevent constipation: ? Drink enough fluid to keep your urine clear or pale yellow. ? Eat foods that are high in fiber, such as fresh fruits and vegetables, whole grains, and beans. Activity  Exercise only as directed by your health care provider. Most women can continue their usual exercise routine during pregnancy. Try to exercise for 30 minutes at least 5 days a week. Stop exercising if you experience uterine contractions.  Avoid heavy lifting, wear low heel shoes, and practice good posture.  A sexual relationship may be continued unless your health care provider directs you otherwise. Relieving pain and discomfort  Wear a good support bra to prevent discomfort from breast tenderness.  Take warm sitz baths to soothe any pain or discomfort caused by hemorrhoids. Use hemorrhoid cream if your health care provider approves.  Rest with your legs elevated if you have leg cramps  or low back pain.  If you develop varicose veins, wear support hose. Elevate your feet for 15 minutes, 3-4 times a day. Limit salt in your diet. Prenatal Care  Write down your questions. Take them to your prenatal visits.  Keep all your prenatal visits as told by your health care provider. This is important. Safety  Wear your seat belt at all times when driving.  Make a list of emergency phone numbers, including numbers for family, friends, the hospital, and police and fire departments. General instructions  Ask your health care provider for a referral to a local prenatal education class. Begin classes no later than the beginning of month 6 of your pregnancy.  Ask for help if you have counseling or nutritional needs during pregnancy. Your health care provider can offer advice or refer you to specialists for help with various needs.  Do not use hot tubs, steam rooms, or saunas.  Do not douche or use tampons or scented sanitary pads.  Do not cross your legs for long periods of time.  Avoid cat litter boxes and soil  used by cats. These carry germs that can cause birth defects in the baby and possibly loss of the fetus by miscarriage or stillbirth.  Avoid all smoking, herbs, alcohol, and unprescribed drugs. Chemicals in these products can affect the formation and growth of the baby.  Do not use any products that contain nicotine or tobacco, such as cigarettes and e-cigarettes. If you need help quitting, ask your health care provider.  Visit your dentist if you have not gone yet during your pregnancy. Use a soft toothbrush to brush your teeth and be gentle when you floss. Contact a health care provider if:  You have dizziness.  You have mild pelvic cramps, pelvic pressure, or nagging pain in the abdominal area.  You have persistent nausea, vomiting, or diarrhea.  You have a bad smelling vaginal discharge.  You have pain when you urinate. Get help right away if:  You have a  fever.  You are leaking fluid from your vagina.  You have spotting or bleeding from your vagina.  You have severe abdominal cramping or pain.  You have rapid weight gain or weight loss.  You have shortness of breath with chest pain.  You notice sudden or extreme swelling of your face, hands, ankles, feet, or legs.  You have not felt your baby move in over an hour.  You have severe headaches that do not go away when you take medicine.  You have vision changes. Summary  The second trimester is from week 14 through week 27 (months 4 through 6). It is also a time when the fetus is growing rapidly.  Your body goes through many changes during pregnancy. The changes vary from woman to woman.  Avoid all smoking, herbs, alcohol, and unprescribed drugs. These chemicals affect the formation and growth your baby.  Do not use any tobacco products, such as cigarettes, chewing tobacco, and e-cigarettes. If you need help quitting, ask your health care provider.  Contact your health care provider if you have any questions. Keep all prenatal visits as told by your health care provider. This is important. This information is not intended to replace advice given to you by your health care provider. Make sure you discuss any questions you have with your health care provider. Document Released: 07/22/2001 Document Revised: 01/03/2016 Document Reviewed: 09/28/2012 [employer]vier Interactive Patient Education  2017 [employer]

## 2017-10-21 ENCOUNTER — Ambulatory Visit (INDEPENDENT_AMBULATORY_CARE_PROVIDER_SITE_OTHER): Payer: Medicaid Other | Admitting: Advanced Practice Midwife

## 2017-10-21 ENCOUNTER — Encounter: Payer: Self-pay | Admitting: Advanced Practice Midwife

## 2017-10-21 ENCOUNTER — Telehealth: Payer: Self-pay | Admitting: *Deleted

## 2017-10-21 VITALS — BP 100/60 | HR 89 | Wt 153.5 lb

## 2017-10-21 DIAGNOSIS — W19XXXA Unspecified fall, initial encounter: Secondary | ICD-10-CM

## 2017-10-21 DIAGNOSIS — Z3482 Encounter for supervision of other normal pregnancy, second trimester: Secondary | ICD-10-CM

## 2017-10-21 DIAGNOSIS — O9A211 Injury, poisoning and certain other consequences of external causes complicating pregnancy, first trimester: Secondary | ICD-10-CM | POA: Diagnosis not present

## 2017-10-21 DIAGNOSIS — Z331 Pregnant state, incidental: Secondary | ICD-10-CM

## 2017-10-21 DIAGNOSIS — Z1389 Encounter for screening for other disorder: Secondary | ICD-10-CM

## 2017-10-21 LAB — POCT URINALYSIS DIPSTICK
Glucose, UA: NEGATIVE
Ketones, UA: NEGATIVE
Nitrite, UA: NEGATIVE
Protein, UA: NEGATIVE

## 2017-10-21 NOTE — Progress Notes (Signed)
WORK IN:  FELL ON "FACE" AT 10am. C/O DECREASED FM.  No bleeding, no pain. Gets dizzy if moves/sits up too quickly  Placed on EFM. Baby extremely active audibly but not felt by pt. 20 minute strip is very reassuring for GA, FHR 150, mod variability, no accels, no decels.    Discussed bp drops/dizziness.  Bleeding precautions discussed.   Pt feels reassured.

## 2017-10-21 NOTE — Telephone Encounter (Signed)
Patient called stating she is feeling dizzy and is having lower abdominal and back pain. Patient told Marylene Landngela she was feeling hot in the face but told me she fell on her face so I'm unclear if she fell or is hot. Asked if she checked her temp but patient stated she did not have a thermometer. Will w/i patient with Drenda FreezeFran at 1:30.

## 2017-10-26 ENCOUNTER — Ambulatory Visit (INDEPENDENT_AMBULATORY_CARE_PROVIDER_SITE_OTHER): Payer: Medicaid Other | Admitting: Women's Health

## 2017-10-26 ENCOUNTER — Encounter: Payer: Self-pay | Admitting: Women's Health

## 2017-10-26 VITALS — BP 102/40 | HR 82 | Wt 154.0 lb

## 2017-10-26 DIAGNOSIS — Z331 Pregnant state, incidental: Secondary | ICD-10-CM

## 2017-10-26 DIAGNOSIS — Z3A23 23 weeks gestation of pregnancy: Secondary | ICD-10-CM

## 2017-10-26 DIAGNOSIS — Z1389 Encounter for screening for other disorder: Secondary | ICD-10-CM

## 2017-10-26 DIAGNOSIS — Z3482 Encounter for supervision of other normal pregnancy, second trimester: Secondary | ICD-10-CM

## 2017-10-26 LAB — POCT URINALYSIS DIPSTICK
Blood, UA: NEGATIVE
Glucose, UA: NEGATIVE
Ketones, UA: NEGATIVE
Nitrite, UA: NEGATIVE
Protein, UA: NEGATIVE

## 2017-10-26 NOTE — Patient Instructions (Signed)
Jessica [name], I greatly value your feedback.  If you receive a survey following your visit with us today, we appreciate you taking the time to fill it out.  Thanks, Joellyn HaffKim Bronwen Pendergraft, CNM, WHNP-BC   You will have your sugar test next visit.  Please do not eat or drink anything after midnight the night before you come, not even water.  You will be here for at least two hours.     Call the office [phone]) or [facility] if:  You begin to have strong, frequent contractions  Your water breaks.  Sometimes it is a big gush of fluid, sometimes it is just a trickle that keeps getting your panties wet or running down your legs  You have vaginal bleeding.  It is normal to have a small amount of spotting if your cervix was checked.   You don't feel your baby moving like normal.  If you don't, get you something to eat and drink and lay down and focus on feeling your baby move.   If your baby is still not moving like normal, you should call the office or [facility].  Second Trimester of Pregnancy The second trimester is from week 13 through week 28, months 4 through 6. The second trimester is often a time when you feel your best. Your body has also adjusted to being pregnant, and you begin to feel better physically. Usually, morning sickness has lessened or quit completely, you may have more energy, and you may have an increase in appetite. The second trimester is also a time when the fetus is growing rapidly. At the end of the sixth month, the fetus is about 9 inches long and weighs about 1 pounds. You will likely begin to feel the baby move (quickening) between 18 and 20 weeks of the pregnancy. BODY CHANGES Your body goes through many changes during pregnancy. The changes vary from woman to woman.   Your weight will continue to increase. You will notice your lower abdomen bulging out.  You may begin to get stretch marks on your hips, abdomen, and breasts.  You may develop  headaches that can be relieved by medicines approved by your health care provider.  You may urinate more often because the fetus is pressing on your bladder.  You may develop or continue to have heartburn as a result of your pregnancy.  You may develop constipation because certain hormones are causing the muscles that push waste through your intestines to slow down.  You may develop hemorrhoids or swollen, bulging veins (varicose veins).  You may have back pain because of the weight gain and pregnancy hormones relaxing your joints between the bones in your pelvis and as a result of a shift in weight and the muscles that support your balance.  Your breasts will continue to grow and be tender.  Your gums may bleed and may be sensitive to brushing and flossing.  Dark spots or blotches (chloasma, mask of pregnancy) may develop on your face. This will likely fade after the baby is born.  A dark line from your belly button to the pubic area (linea nigra) may appear. This will likely fade after the baby is born.  You may have changes in your hair. These can include thickening of your hair, rapid growth, and changes in texture. Some women also have hair loss during or after pregnancy, or hair that feels dry or thin. Your hair will most likely return to normal after your  baby is born. WHAT TO EXPECT AT YOUR PRENATAL VISITS During a routine prenatal visit:  You will be weighed to make sure you and the fetus are growing normally.  Your blood pressure will be taken.  Your abdomen will be measured to track your baby's growth.  The fetal heartbeat will be listened to.  Any test results from the previous visit will be discussed. Your health care provider may ask you:  How you are feeling.  If you are feeling the baby move.  If you have had any abnormal symptoms, such as leaking fluid, bleeding, severe headaches, or abdominal cramping.  If you have any questions. Other tests that may be  performed during your second trimester include:  Blood tests that check for:  Low iron levels (anemia).  Gestational diabetes (between 24 and 28 weeks).  Rh antibodies.  Urine tests to check for infections, diabetes, or protein in the urine.  An ultrasound to confirm the proper growth and development of the baby.  An amniocentesis to check for possible genetic problems.  Fetal screens for spina bifida and Down syndrome. HOME CARE INSTRUCTIONS   Avoid all smoking, herbs, alcohol, and unprescribed drugs. These chemicals affect the formation and growth of the baby.  Follow your health care provider's instructions regarding medicine use. There are medicines that are either safe or unsafe to take during pregnancy.  Exercise only as directed by your health care provider. Experiencing uterine cramps is a good sign to stop exercising.  Continue to eat regular, healthy meals.  Wear a good support bra for breast tenderness.  Do not use hot tubs, steam rooms, or saunas.  Wear your seat belt at all times when driving.  Avoid raw meat, uncooked cheese, cat litter boxes, and soil used by cats. These carry germs that can cause birth defects in the baby.  Take your prenatal vitamins.  Try taking a stool softener (if your health care provider approves) if you develop constipation. Eat more high-fiber foods, such as fresh vegetables or fruit and whole grains. Drink plenty of fluids to keep your urine clear or pale yellow.  Take warm sitz baths to soothe any pain or discomfort caused by hemorrhoids. Use hemorrhoid cream if your health care provider approves.  If you develop varicose veins, wear support hose. Elevate your feet for 15 minutes, 3-4 times a day. Limit salt in your diet.  Avoid heavy lifting, wear low heel shoes, and practice good posture.  Rest with your legs elevated if you have leg cramps or low back pain.  Visit your dentist if you have not gone yet during your pregnancy.  Use a soft toothbrush to brush your teeth and be gentle when you floss.  A sexual relationship may be continued unless your health care provider directs you otherwise.  Continue to go to all your prenatal visits as directed by your health care provider. SEEK MEDICAL CARE IF:   You have dizziness.  You have mild pelvic cramps, pelvic pressure, or nagging pain in the abdominal area.  You have persistent nausea, vomiting, or diarrhea.  You have a bad smelling vaginal discharge.  You have pain with urination. SEEK IMMEDIATE MEDICAL CARE IF:   You have a fever.  You are leaking fluid from your vagina.  You have spotting or bleeding from your vagina.  You have severe abdominal cramping or pain.  You have rapid weight gain or loss.  You have shortness of breath with chest pain.  You notice sudden or extreme   swelling of your face, hands, ankles, feet, or legs.  You have not felt your baby move in over an hour.  You have severe headaches that do not go away with medicine.  You have vision changes. Document Released: 07/22/2001 Document Revised: 08/02/2013 Document Reviewed: 09/28/2012 [facility] Patient Information 2015 [place], [place]. This information is not intended to replace advice given to you by your health care provider. Make sure you discuss any questions you have with your health care provider.

## 2017-10-26 NOTE — Progress Notes (Signed)
   LOW-RISK PREGNANCY VISIT Patient name: Jessica [name] MRN [id number]  Date of birth: 04/23/1991 Chief Complaint:   Routine Prenatal Visit  History of Present Illness:   Jessica [name] is a 27 y.o. 712P1001 female at 703w3d with an Estimated Date of Delivery: 02/19/18 being seen today for ongoing management of a low-risk pregnancy.  Today she reports no complaints. Contractions: Not present. Vag. Bleeding: None.  Movement: Present. denies leaking of fluid. Review of Systems:   Pertinent items are noted in HPI Denies abnormal vaginal discharge w/ itching/odor/irritation, headaches, visual changes, shortness of breath, chest pain, abdominal pain, severe nausea/vomiting, or problems with urination or bowel movements unless otherwise stated above. Pertinent History Reviewed:  Reviewed past medical,surgical, social, obstetrical and family history.  Reviewed problem list, medications and allergies. Physical Assessment:   Vitals:   10/26/17 1131  BP: (!) 102/40  Pulse: 82  Weight: 154 lb (69.9 kg)  Body mass index is 28.17 kg/m.        Physical Examination:   General appearance: Well appearing, and in no distress  Mental status: Alert, oriented to person, place, and time  Skin: Warm & dry  Cardiovascular: Normal heart rate noted  Respiratory: Normal respiratory effort, no distress  Abdomen: Soft, gravid, nontender  Pelvic: Cervical exam deferred         Extremities: Edema: Trace  Fetal Status: Fetal Heart Rate (bpm): 146 Fundal Height: 23 cm Movement: Present    Results for orders placed or performed in visit on 10/26/17 (from the past 24 hour(s))  POCT urinalysis dipstick   Collection Time: 10/26/17 11:32 AM  Result Value Ref Range   Color, UA     Clarity, UA     Glucose, UA neg    Bilirubin, UA     Ketones, UA neg    Spec Grav, UA  1.010 - 1.025   Blood, UA neg    pH, UA  5.0 - 8.0   Protein, UA neg    Urobilinogen, UA  0.2 or 1.0 E.U./dL   Nitrite, UA neg    Leukocytes,  UA Trace (A) Negative   Appearance     Odor      Assessment & Plan:  1) Low-risk pregnancy [name] at 224w3d with an Estimated Date of Delivery: 02/19/18    Meds: No orders of the defined types were placed in this encounter.  Labs/procedures today: none  Plan:  Continue routine obstetrical care   Reviewed: Preterm labor symptoms and general obstetric precautions including but not limited to vaginal bleeding, contractions, leaking of fluid and fetal movement were reviewed in detail with the patient.  All questions were answered  Follow-up: Return in about 4 weeks (around 11/23/2017) for LROB, PN2.  Orders Placed This Encounter  Procedures  . POCT urinalysis dipstick   Cheral MarkerKimberly R Susie Pousson CNM, [facility]NP-BC 10/26/2017 11:47 AM

## 2017-11-02 ENCOUNTER — Other Ambulatory Visit: Payer: Self-pay

## 2017-11-02 ENCOUNTER — Encounter (HOSPITAL_COMMUNITY): Payer: Self-pay | Admitting: *Deleted

## 2017-11-02 ENCOUNTER — Inpatient Hospital Stay (HOSPITAL_COMMUNITY): Payer: Medicaid Other

## 2017-11-02 ENCOUNTER — Inpatient Hospital Stay (HOSPITAL_COMMUNITY)
Admission: AD | Admit: 2017-11-02 | Discharge: 2017-11-02 | Disposition: A | Discharge status: Home / Self Care | Payer: Medicaid Other | Source: Ambulatory Visit | Attending: Obstetrics & Gynecology | Admitting: Obstetrics & Gynecology

## 2017-11-02 DIAGNOSIS — F329 Major depressive disorder, single episode, unspecified: Secondary | ICD-10-CM | POA: Diagnosis not present

## 2017-11-02 DIAGNOSIS — W19XXXA Unspecified fall, initial encounter: Secondary | ICD-10-CM

## 2017-11-02 DIAGNOSIS — F419 Anxiety disorder, unspecified: Secondary | ICD-10-CM

## 2017-11-02 DIAGNOSIS — G8929 Other chronic pain: Secondary | ICD-10-CM | POA: Insufficient documentation

## 2017-11-02 DIAGNOSIS — O9A212 Injury, poisoning and certain other consequences of external causes complicating pregnancy, second trimester: Secondary | ICD-10-CM

## 2017-11-02 DIAGNOSIS — Z8759 Personal history of other complications of pregnancy, childbirth and the puerperium: Secondary | ICD-10-CM

## 2017-11-02 DIAGNOSIS — M549 Dorsalgia, unspecified: Secondary | ICD-10-CM | POA: Diagnosis not present

## 2017-11-02 DIAGNOSIS — O26899 Other specified pregnancy related conditions, unspecified trimester: Secondary | ICD-10-CM

## 2017-11-02 DIAGNOSIS — F909 Attention-deficit hyperactivity disorder, unspecified type: Secondary | ICD-10-CM | POA: Insufficient documentation

## 2017-11-02 DIAGNOSIS — Z3A24 24 weeks gestation of pregnancy: Secondary | ICD-10-CM

## 2017-11-02 DIAGNOSIS — O99342 Other mental disorders complicating pregnancy, second trimester: Secondary | ICD-10-CM | POA: Diagnosis not present

## 2017-11-02 DIAGNOSIS — Z87891 Personal history of nicotine dependence: Secondary | ICD-10-CM | POA: Insufficient documentation

## 2017-11-02 DIAGNOSIS — O9989 Other specified diseases and conditions complicating pregnancy, childbirth and the puerperium: Secondary | ICD-10-CM

## 2017-11-02 DIAGNOSIS — Z813 Family history of other psychoactive substance abuse and dependence: Secondary | ICD-10-CM | POA: Insufficient documentation

## 2017-11-02 DIAGNOSIS — Z79899 Other long term (current) drug therapy: Secondary | ICD-10-CM | POA: Diagnosis not present

## 2017-11-02 DIAGNOSIS — O99352 Diseases of the nervous system complicating pregnancy, second trimester: Secondary | ICD-10-CM | POA: Insufficient documentation

## 2017-11-02 DIAGNOSIS — O26892 Other specified pregnancy related conditions, second trimester: Secondary | ICD-10-CM | POA: Diagnosis not present

## 2017-11-02 DIAGNOSIS — T1490XA Injury, unspecified, initial encounter: Secondary | ICD-10-CM | POA: Diagnosis not present

## 2017-11-02 DIAGNOSIS — Z888 Allergy status to other drugs, medicaments and biological substances status: Secondary | ICD-10-CM | POA: Diagnosis not present

## 2017-11-02 DIAGNOSIS — Z9889 Other specified postprocedural states: Secondary | ICD-10-CM | POA: Insufficient documentation

## 2017-11-02 DIAGNOSIS — O99891 Other specified diseases and conditions complicating pregnancy: Secondary | ICD-10-CM

## 2017-11-02 DIAGNOSIS — Z6791 Unspecified blood type, Rh negative: Secondary | ICD-10-CM

## 2017-11-02 DIAGNOSIS — R1031 Right lower quadrant pain: Secondary | ICD-10-CM | POA: Diagnosis not present

## 2017-11-02 DIAGNOSIS — Z98891 History of uterine scar from previous surgery: Secondary | ICD-10-CM

## 2017-11-02 DIAGNOSIS — Z3482 Encounter for supervision of other normal pregnancy, second trimester: Secondary | ICD-10-CM

## 2017-11-02 LAB — URINALYSIS, ROUTINE W REFLEX MICROSCOPIC
Bilirubin Urine: NEGATIVE
Glucose, UA: NEGATIVE mg/dL
Hgb urine dipstick: NEGATIVE
Ketones, ur: NEGATIVE mg/dL
Nitrite: NEGATIVE
Protein, ur: NEGATIVE mg/dL
Specific Gravity, Urine: 1.014 (ref 1.005–1.030)
pH: 6 (ref 5.0–8.0)

## 2017-11-02 LAB — KLEIHAUER-BETKE STAIN
# Vials RhIg: 1
Fetal Cells %: 0 %
Quantitation Fetal Hemoglobin: 0 mL

## 2017-11-02 MED ORDER — CYCLOBENZAPRINE HCL 10 MG PO TABS
10.0000 mg | ORAL_TABLET | Freq: Three times a day (TID) | ORAL | Status: DC | PRN
  Administered 2017-11-02: 10 mg via ORAL
  Filled 2017-11-02: qty 1

## 2017-11-02 MED ORDER — ACETAMINOPHEN 500 MG PO TABS
1000.0000 mg | ORAL_TABLET | Freq: Four times a day (QID) | ORAL | Status: DC | PRN
  Administered 2017-11-02: 1000 mg via ORAL
  Filled 2017-11-02: qty 2

## 2017-11-02 MED ORDER — RHO D IMMUNE GLOBULIN 1500 UNIT/2ML IJ SOSY
300.0000 ug | PREFILLED_SYRINGE | Freq: Once | INTRAMUSCULAR | Status: AC
  Administered 2017-11-02: 300 ug via INTRAMUSCULAR
  Filled 2017-11-02: qty 2

## 2017-11-02 MED ORDER — CYCLOBENZAPRINE HCL 10 MG PO TABS: 10.0000 mg | ORAL_TABLET | Freq: Three times a day (TID) | ORAL | 0 refills | Status: DC | PRN

## 2017-11-02 NOTE — MAU Provider Note (Signed)
History     CSN: [id number]  Arrival date and time: 11/02/17 1225   First Provider Initiated Contact with Patient 11/02/17 1316      Chief Complaint  Patient presents with  . Fall   [name] @24 .3 wks here after a fall at 11am. She states her legs cramped up and caused her to fall onto her abdomen. She denies VB or LOF. She reports some RLQ pain and back pain since fall. +FM. No LOC or head trauma.   OB History    Gravida  2   Para  1   Term  1   Preterm      AB      Living  1     SAB      TAB      Ectopic      Multiple      Live Births  1           Past Medical History:  Diagnosis Date  . Abnormal Pap smear   . ADHD (attention deficit hyperactivity disorder)   . Chronic back pain   . Depression   . Migraine headache   . UTI (lower urinary tract infection) 01/12/2013    Past Surgical History:  Procedure Laterality Date  . CESAREAN SECTION N/A 09/08/2013   Procedure: CESAREAN SECTION;  Surgeon: Tereso Newcomer, MD;  Location: [facility];  Service: Obstetrics;  Laterality: N/A;  . TOOTH EXTRACTION      Family History  Problem Relation Age of Onset  . Drug abuse Mother     Social History   Tobacco Use  . Smoking status: Former Smoker    Packs/day: 1.00    Types: Cigarettes  . Smokeless tobacco: Never Used  Substance Use Topics  . Alcohol use: No  . Drug use: No    Allergies:  Allergies  Allergen Reactions  . Atarax [Hydroxyzine]     "makes me crazy"    Medications Prior to Admission  Medication Sig Dispense Refill Last Dose  . acetaminophen (TYLENOL) 500 MG tablet Take 500 mg by mouth every 6 (six) hours as needed for mild pain or headache.   Past Month at Unknown time  . calcium carbonate (TUMS - DOSED IN MG ELEMENTAL CALCIUM) 500 MG chewable tablet Chew 2 tablets by mouth 2 (two) times daily as needed for indigestion or heartburn.   11/01/2017 at Unknown time  . Prenatal Vit-Fe Fumarate-FA (PRENATAL MULTIVITAMIN) TABS tablet Take 1  tablet by mouth daily at 12 noon.   11/01/2017 at Unknown time  . Doxylamine-Pyridoxine (DICLEGIS) 10-10 MG TBEC 2 tabs q hs, if sx persist add 1 tab q am on day 3, if sx persist add 1 tab q afternoon on day 4 (Patient not taking: Reported on 11/02/2017) 100 tablet 6 Not Taking at Unknown time    Review of Systems  Gastrointestinal: Positive for abdominal pain.  Genitourinary: Negative for vaginal bleeding.  Musculoskeletal: Positive for back pain.   Physical Exam   Blood pressure (!) 90/43, pulse 81, temperature 98.2 F (36.8 C), temperature source Oral, resp. rate 18, height 5\' 2"  (1.575 m), weight 151 lb 12 oz (68.8 kg), last menstrual period 05/08/2017, SpO2 99 %.  Physical Exam  Nursing note and vitals reviewed. Constitutional: She is oriented to person, place, and time. She appears well-developed and well-nourished. No distress.  HENT:  Head: Normocephalic and atraumatic.  Neck: Normal range of motion.  Cardiovascular: Normal rate.  Respiratory: Effort normal. No respiratory distress.  GI:  Soft. She exhibits no distension. There is no tenderness.  gravid  Genitourinary:  Genitourinary Comments: Cervix closed/long  Musculoskeletal: Normal range of motion.  Neurological: She is alert and oriented to person, place, and time.  Skin: Skin is warm and dry.  Psychiatric: She has a normal mood and affect.  EFM: 145 bpm, mod variability, + accels, occ variable decels Toco: none  Results for orders placed or performed during the hospital encounter of 11/02/17 (from the past 24 hour(s))  Urinalysis, Routine w reflex microscopic     Status: Abnormal   Collection Time: 11/02/17 12:40 PM  Result Value Ref Range   Color, Urine YELLOW YELLOW   APPearance CLOUDY (A) CLEAR   Specific Gravity, Urine 1.014 1.005 - 1.030   pH 6.0 5.0 - 8.0   Glucose, UA NEGATIVE NEGATIVE mg/dL   Hgb urine dipstick NEGATIVE NEGATIVE   Bilirubin Urine NEGATIVE NEGATIVE   Ketones, ur NEGATIVE NEGATIVE mg/dL    Protein, ur NEGATIVE NEGATIVE mg/dL   Nitrite NEGATIVE NEGATIVE   Leukocytes, UA LARGE (A) NEGATIVE   RBC / HPF 0-5 0 - 5 RBC/hpf   WBC, UA TOO NUMEROUS TO COUNT 0 - 5 WBC/hpf   Bacteria, UA RARE (A) NONE SEEN   Squamous Epithelial / LPF 6-30 (A) NONE SEEN   Mucus PRESENT   Kleihauer-Betke stain     Status: None   Collection Time: 11/02/17  1:40 PM  Result Value Ref Range   Fetal Cells % 0 %   Quantitation Fetal Hemoglobin 0 mL   # Vials RhIg 1   Rh IG workup (includes ABO/Rh)     Status: None (Preliminary result)   Collection Time: 11/02/17  1:40 PM  Result Value Ref Range   Gestational Age(Wks) 24    ABO/RH(D) A NEG    Antibody Screen NEG    Unit Number [id number]    Blood Component Type RHIG    Unit division 00    Status of Unit ISSUED    Transfusion Status      OK TO TRANSFUSE Performed at [facility], [address] [place], [place] [address]     MAU Course  Procedures Rhogam  MDM Labs and US ordered and reviewed. KB neg. No evidence of abruption on US. Back pain likely MSK from fall, Rx for Flexeril and heat prn. Stable for discharge home.  Assessment and Plan  [redacted] weeks gestation S/p fall Back pain in pregnancy Discharge home Follow up in OB office as scheduled Rx Flexeril Abruption/return precautions  Allergies as of 11/02/2017      Reactions   Atarax [hydroxyzine]    "makes me crazy"      Medication List    TAKE these medications   acetaminophen 500 MG tablet Commonly known as:  TYLENOL Take 500 mg by mouth every 6 (six) hours as needed for mild pain or headache.   calcium carbonate 500 MG chewable tablet Commonly known as:  TUMS - dosed in mg elemental calcium Chew 2 tablets by mouth 2 (two) times daily as needed for indigestion or heartburn.   cyclobenzaprine 10 MG tablet Commonly known as:  FLEXERIL Take 1 tablet (10 mg total) by mouth 3 (three) times daily as needed for muscle spasms.   Doxylamine-Pyridoxine 10-10 MG  Tbec Commonly known as:  DICLEGIS 2 tabs q hs, if sx persist add 1 tab q am on day 3, if sx persist add 1 tab q afternoon on day 4   prenatal multivitamin Tabs tablet Take  1 tablet by mouth daily at 12 noon.      Donette Larry, CNM 11/02/2017, 4:58 PM

## 2017-11-02 NOTE — MAU Note (Signed)
Urine in lab 

## 2017-11-02 NOTE — Discharge Instructions (Signed)

## 2017-11-02 NOTE — MAU Note (Signed)
Heat pack applied to lower back, with barrier in place.

## 2017-11-02 NOTE — MAU Note (Addendum)
Pt. Arrived via EMS. Here for "fall", around 11:00 a.m. Pt. Landed on her "stomach".  EFM applied FHR 140s, Toco applied - abd. Soft. Pt. Also with complaint of "Legs hurting".

## 2017-11-03 LAB — RH IG WORKUP (INCLUDES ABO/RH)
ABO/RH(D): A NEG
Antibody Screen: NEGATIVE
Gestational Age(Wks): 24
Unit division: 0

## 2017-11-10 ENCOUNTER — Telehealth: Payer: Self-pay | Admitting: *Deleted

## 2017-11-10 ENCOUNTER — Encounter: Payer: Self-pay | Admitting: Obstetrics and Gynecology

## 2017-11-10 NOTE — Telephone Encounter (Signed)
Patient called with complaints of severe lover back pain and abdominal pain.  States it is not contractions just pain.  Also states she has not felt the baby move this morning in which the baby is normally very active.  Will w/i this morning with Dr Emelda FearFerguson.

## 2017-11-17 ENCOUNTER — Telehealth: Payer: Self-pay | Admitting: *Deleted

## 2017-11-17 NOTE — Telephone Encounter (Signed)
Patient is requesting prescription for something for allergies.  Informed patient she could use over the counter Claritin/Zyrtec or generic.  Stated she did not now what she could take. Verbalized understanding.

## 2017-11-21 ENCOUNTER — Inpatient Hospital Stay (HOSPITAL_COMMUNITY)
Admission: AD | Admit: 2017-11-21 | Discharge: 2017-11-21 | Disposition: A | Discharge status: Home / Self Care | Payer: Medicaid Other | Source: Ambulatory Visit | Attending: Obstetrics & Gynecology | Admitting: Obstetrics & Gynecology

## 2017-11-21 ENCOUNTER — Inpatient Hospital Stay (HOSPITAL_COMMUNITY): Payer: Medicaid Other

## 2017-11-21 ENCOUNTER — Encounter (HOSPITAL_COMMUNITY): Payer: Self-pay | Admitting: *Deleted

## 2017-11-21 DIAGNOSIS — F419 Anxiety disorder, unspecified: Secondary | ICD-10-CM | POA: Diagnosis not present

## 2017-11-21 DIAGNOSIS — O99342 Other mental disorders complicating pregnancy, second trimester: Secondary | ICD-10-CM | POA: Insufficient documentation

## 2017-11-21 DIAGNOSIS — R1011 Right upper quadrant pain: Secondary | ICD-10-CM | POA: Diagnosis present

## 2017-11-21 DIAGNOSIS — T148XXA Other injury of unspecified body region, initial encounter: Secondary | ICD-10-CM

## 2017-11-21 DIAGNOSIS — Z8759 Personal history of other complications of pregnancy, childbirth and the puerperium: Secondary | ICD-10-CM

## 2017-11-21 DIAGNOSIS — Z3A27 27 weeks gestation of pregnancy: Secondary | ICD-10-CM | POA: Diagnosis not present

## 2017-11-21 DIAGNOSIS — Z98891 History of uterine scar from previous surgery: Secondary | ICD-10-CM

## 2017-11-21 DIAGNOSIS — O26892 Other specified pregnancy related conditions, second trimester: Secondary | ICD-10-CM

## 2017-11-21 DIAGNOSIS — R109 Unspecified abdominal pain: Secondary | ICD-10-CM

## 2017-11-21 DIAGNOSIS — Z87891 Personal history of nicotine dependence: Secondary | ICD-10-CM | POA: Diagnosis not present

## 2017-11-21 LAB — URINALYSIS, ROUTINE W REFLEX MICROSCOPIC
Bilirubin Urine: NEGATIVE
Glucose, UA: NEGATIVE mg/dL
Hgb urine dipstick: NEGATIVE
Ketones, ur: NEGATIVE mg/dL
Leukocytes, UA: NEGATIVE
Nitrite: NEGATIVE
Protein, ur: NEGATIVE mg/dL
Specific Gravity, Urine: 1.014 (ref 1.005–1.030)
pH: 7 (ref 5.0–8.0)

## 2017-11-21 LAB — RAPID URINE DRUG SCREEN, HOSP PERFORMED
Amphetamines: NOT DETECTED
Barbiturates: NOT DETECTED
Benzodiazepines: NOT DETECTED
Cocaine: NOT DETECTED
Opiates: NOT DETECTED
Tetrahydrocannabinol: NOT DETECTED

## 2017-11-21 MED ORDER — CYCLOBENZAPRINE HCL 10 MG PO TABS: 10.0000 mg | ORAL_TABLET | Freq: Two times a day (BID) | ORAL | 0 refills | Status: DC | PRN

## 2017-11-21 MED ORDER — OXYCODONE-ACETAMINOPHEN 5-325 MG PO TABS
1.0000 | ORAL_TABLET | Freq: Once | ORAL | Status: AC
  Administered 2017-11-21: 1 via ORAL
  Filled 2017-11-21: qty 1

## 2017-11-21 MED ORDER — CYCLOBENZAPRINE HCL 10 MG PO TABS
10.0000 mg | ORAL_TABLET | Freq: Once | ORAL | Status: AC
Start: 2017-11-21 — End: 2017-11-21
  Administered 2017-11-21: 10 mg via ORAL
  Filled 2017-11-21: qty 1

## 2017-11-21 NOTE — Discharge Instructions (Signed)

## 2017-11-21 NOTE — MAU Provider Note (Signed)
History   G2P1101 @ 27.1 wks in with c/o right upper quad pain and tenderness since last night. Diet review fried chicken for supper last night. Pain is worse with movement.  CSN: [id number]  Arrival date & time 11/21/17  1300   First Provider Initiated Contact with Patient 11/21/17 1322      No chief complaint on file.   HPI  Past Medical History:  Diagnosis Date  . Abnormal Pap smear   . ADHD (attention deficit hyperactivity disorder)   . Chronic back pain   . Depression   . Migraine headache   . UTI (lower urinary tract infection) 01/12/2013    Past Surgical History:  Procedure Laterality Date  . CESAREAN SECTION N/A 09/08/2013   Procedure: CESAREAN SECTION;  Surgeon: Tereso NewcomerUgonna A Anyanwu, MD;  Location: [facility];  Service: Obstetrics;  Laterality: N/A;  . TOOTH EXTRACTION      Family History  Problem Relation Age of Onset  . Drug abuse Mother     Social History   Tobacco Use  . Smoking status: Former Smoker    Packs/day: 1.00    Types: Cigarettes  . Smokeless tobacco: Never Used  Substance Use Topics  . Alcohol use: No  . Drug use: No    OB History    Gravida  2   Para  1   Term  1   Preterm      AB      Living  1     SAB      TAB      Ectopic      Multiple      Live Births  1           Review of Systems  Constitutional: Negative.   HENT: Negative.   Eyes: Negative.   Respiratory: Negative.   Cardiovascular: Negative.   Gastrointestinal: Positive for abdominal pain.  Endocrine: Negative.   Genitourinary: Negative.   Musculoskeletal: Negative.   Skin: Negative.   Allergic/Immunologic: Negative.   Neurological: Negative.   Hematological: Negative.   Psychiatric/Behavioral: Negative.     Allergies  Atarax [hydroxyzine]  Home Medications    BP 130/72 (BP Location: Left Arm)   Pulse (!) 110   Temp 97.9 F (36.6 C) (Oral)   Resp 18   LMP 05/08/2017 (Approximate)   SpO2 99%   Physical Exam  Constitutional: She is oriented  to person, place, and time. She appears well-developed and well-nourished.  Eyes: Pupils are equal, round, and reactive to light.  Neck: Normal range of motion.  Cardiovascular: Normal rate, regular rhythm, normal heart sounds and intact distal pulses.  Pulmonary/Chest: Effort normal and breath sounds normal.  Abdominal: Soft. Bowel sounds are normal. There is tenderness.  Genitourinary: Vagina normal and uterus normal.  Musculoskeletal: Normal range of motion.  Neurological: She is alert and oriented to person, place, and time. She has normal reflexes.  Skin: Skin is warm and dry.  Psychiatric: She has a normal mood and affect. Her behavior is normal. Judgment and thought content normal.    MAU Course  Procedures (including critical care time)  Labs Reviewed  URINALYSIS, ROUTINE W REFLEX MICROSCOPIC  RAPID URINE DRUG SCREEN, HOSP PERFORMED   No results found.   1. Abdominal pain in pregnancy, second trimester   2. History of cesarean delivery   3. Anxiety   4. History of gestational hypertension   5. Abdominal pain, right upper quadrant       MDM  FHR pattern reassuring  no decels, no uc's, with exam RUQ tenderness noted. [place] normal. Discussed muscle strain with pt she verbalized understanding. Will d/c home

## 2017-11-21 NOTE — Progress Notes (Signed)
Patient came to MAU by EMS for sudden right upper quadrant abdominal pain.  Denies CTX.  Denies n/v/d.  No vaginal bleeding or LOF.

## 2017-11-23 ENCOUNTER — Telehealth: Payer: Self-pay | Admitting: *Deleted

## 2017-11-23 ENCOUNTER — Ambulatory Visit (INDEPENDENT_AMBULATORY_CARE_PROVIDER_SITE_OTHER): Payer: Medicaid Other | Admitting: Obstetrics and Gynecology

## 2017-11-23 ENCOUNTER — Other Ambulatory Visit: Payer: Medicaid Other

## 2017-11-23 VITALS — BP 112/68 | HR 92 | Wt 160.0 lb

## 2017-11-23 DIAGNOSIS — R1031 Right lower quadrant pain: Secondary | ICD-10-CM

## 2017-11-23 DIAGNOSIS — Z331 Pregnant state, incidental: Secondary | ICD-10-CM

## 2017-11-23 DIAGNOSIS — Z3482 Encounter for supervision of other normal pregnancy, second trimester: Secondary | ICD-10-CM

## 2017-11-23 DIAGNOSIS — Z3A27 27 weeks gestation of pregnancy: Secondary | ICD-10-CM

## 2017-11-23 DIAGNOSIS — Z3403 Encounter for supervision of normal first pregnancy, third trimester: Secondary | ICD-10-CM

## 2017-11-23 DIAGNOSIS — Z1389 Encounter for screening for other disorder: Secondary | ICD-10-CM

## 2017-11-23 DIAGNOSIS — O9989 Other specified diseases and conditions complicating pregnancy, childbirth and the puerperium: Secondary | ICD-10-CM

## 2017-11-23 DIAGNOSIS — Z131 Encounter for screening for diabetes mellitus: Secondary | ICD-10-CM

## 2017-11-23 LAB — POCT URINALYSIS DIPSTICK
Blood, UA: NEGATIVE
Glucose, UA: NEGATIVE
Ketones, UA: NEGATIVE
Leukocytes, UA: NEGATIVE
Nitrite, UA: NEGATIVE
Protein, UA: NEGATIVE

## 2017-11-23 MED ORDER — MIRTAZAPINE 15 MG PO TABS: 15.0000 mg | ORAL_TABLET | Freq: Every day | ORAL | 0 refills | Status: DC

## 2017-11-23 NOTE — Telephone Encounter (Signed)
Informed patient labs were not resulted yet and would not be until tomorrow. Also made patient aware that lab stated she missed her last lab draw so test will need to be repeated.  Stated she thought she was finished so will come Thursday.

## 2017-11-23 NOTE — Addendum Note (Signed)
Addended by: Federico FlakeNES, Earl Losee A on: 11/23/2017 12:17 PM   Modules accepted: Orders

## 2017-11-23 NOTE — Progress Notes (Addendum)
   LOW-RISK PREGNANCY VISIT Patient name: Jessica [name] MRN [id number]  Date of birth: 02/19/1991 Chief Complaint:   Routine Prenatal Visit (PN2)  History of Present Illness:   Jessica [name] is a 27 y.o. 272P1001 female at 3373w3d with an Estimated Date of Delivery: 02/19/18 being seen today for ongoing management of a low-risk pregnancy.  Seen with Dr. Wonda Oldsiccio  Today she reports stabbing sharp right abdominal and inguinal pain, that gets worse at night. Tylenol or warm compresses do not help. It has affected her sleep for the past 4 nights. She did not have any pains during her first pregnancy. She went to MAU by EMS for sudden right upper quadrant abdominal pain on 11/21/17, and was given Flexeril, which helped slightly.  Contractions: Not present. Vag. Bleeding: None.  Movement: Present. denies leaking of fluid. Review of Systems:   Pertinent items are noted in HPI Denies abnormal vaginal discharge w/ itching/odor/irritation, headaches, visual changes, shortness of breath, chest pain, abdominal pain, severe nausea/vomiting, or problems with urination or bowel movements unless otherwise stated above. Pertinent History Reviewed:  Reviewed past medical,surgical, social, obstetrical and family history.  Reviewed problem list, medications and allergies. Physical Assessment:   Vitals:   11/23/17 0922  BP: 112/68  Pulse: 92  Weight: 160 lb (72.6 kg)  Body mass index is 29.26 kg/m.        Physical Examination:   General appearance: Well appearing, and in no distress  Mental status: Alert, oriented to person, place, and time  Skin: Warm & dry  Cardiovascular: Normal heart rate noted  Respiratory: Normal respiratory effort, no distress  Abdomen: Soft, gravid, nontender  Pelvic: Cervical exam deferred         Extremities: Edema: Trace  Fetal Status: Fetal Heart Rate (bpm): 143 Fundal Height: 28 cm Movement: Present    No results found for this or any previous visit (from the past 24  hour(s)).  Assessment & Plan:  1) Low-risk pregnancy [name] at 4573w3d with an Estimated Date of Delivery: 02/19/18   2) RLQ abdominal pain, pt having trouble sleeping due to pain at night. Rx remeron for sleep x 10 pillsv  Cat C   Meds: No orders of the defined types were placed in this encounter.  Labs/procedures today: doppler  Plan:  Continue routine obstetrical care   Reviewed: Preterm labor symptoms and general obstetric precautions including but not limited to vaginal bleeding, contractions, leaking of fluid and fetal movement were reviewed in detail with the patient.  All questions were answered  Follow-up: Return in about 1 month (around 12/21/2017), or if symptoms worsen or fail to improve, for LROB. Rx Remeron 15 mg x 10 tabs to use 2-3x per week max. No orders of the defined types were placed in this encounter.   By signing my name below, I, Izna Ahmed, attest that this documentation has been prepared under the direction and in the presence of Tilda BurrowFerguson, George Alcantar V, MD. Electronically Signed: Redge GainerIzna Ahmed, Medical Scribe. 11/23/17. 10:14 AM.

## 2017-11-24 ENCOUNTER — Other Ambulatory Visit: Payer: Self-pay

## 2017-11-26 ENCOUNTER — Other Ambulatory Visit: Payer: Self-pay

## 2017-11-30 ENCOUNTER — Other Ambulatory Visit: Payer: Medicaid Other

## 2017-11-30 LAB — CBC
Hematocrit: 36 % (ref 34.0–46.6)
Hemoglobin: 11.4 g/dL (ref 11.1–15.9)
MCH: 27.2 pg (ref 26.6–33.0)
MCHC: 31.7 g/dL (ref 31.5–35.7)
MCV: 86 fL (ref 79–97)
Platelets: 262 10*3/uL (ref 150–379)
RBC: 4.19 x10E6/uL (ref 3.77–5.28)
RDW: 14.6 % (ref 12.3–15.4)
WBC: 10.9 10*3/uL — ABNORMAL HIGH (ref 3.4–10.8)

## 2017-11-30 LAB — AB SCR+ANTIBODY ID: Antibody Screen: POSITIVE — AB

## 2017-11-30 LAB — GLUCOSE TOLERANCE, 2 HOURS W/ 1HR

## 2017-11-30 LAB — HIV ANTIBODY (ROUTINE TESTING W REFLEX): HIV Screen 4th Generation wRfx: NONREACTIVE

## 2017-11-30 LAB — RPR: RPR Ser Ql: NONREACTIVE

## 2017-11-30 LAB — ANTIBODY SCREEN

## 2017-12-01 LAB — GLUCOSE TOLERANCE, 2 HOURS W/ 1HR
Glucose, 1 hour: 147 mg/dL (ref 65–179)
Glucose, 2 hour: 73 mg/dL (ref 65–152)
Glucose, Fasting: 83 mg/dL (ref 65–91)

## 2017-12-21 ENCOUNTER — Encounter (INDEPENDENT_AMBULATORY_CARE_PROVIDER_SITE_OTHER): Payer: Self-pay

## 2017-12-21 ENCOUNTER — Ambulatory Visit (INDEPENDENT_AMBULATORY_CARE_PROVIDER_SITE_OTHER): Payer: Medicaid Other | Admitting: Women's Health

## 2017-12-21 ENCOUNTER — Encounter: Payer: Self-pay | Admitting: Women's Health

## 2017-12-21 VITALS — BP 100/60 | HR 98 | Wt 163.0 lb

## 2017-12-21 DIAGNOSIS — O360130 Maternal care for anti-D [Rh] antibodies, third trimester, not applicable or unspecified: Secondary | ICD-10-CM

## 2017-12-21 DIAGNOSIS — M545 Low back pain: Secondary | ICD-10-CM

## 2017-12-21 DIAGNOSIS — Z331 Pregnant state, incidental: Secondary | ICD-10-CM

## 2017-12-21 DIAGNOSIS — O9989 Other specified diseases and conditions complicating pregnancy, childbirth and the puerperium: Secondary | ICD-10-CM

## 2017-12-21 DIAGNOSIS — Z3A31 31 weeks gestation of pregnancy: Secondary | ICD-10-CM

## 2017-12-21 DIAGNOSIS — Z1389 Encounter for screening for other disorder: Secondary | ICD-10-CM

## 2017-12-21 DIAGNOSIS — O09293 Supervision of pregnancy with other poor reproductive or obstetric history, third trimester: Secondary | ICD-10-CM

## 2017-12-21 DIAGNOSIS — O34219 Maternal care for unspecified type scar from previous cesarean delivery: Secondary | ICD-10-CM

## 2017-12-21 DIAGNOSIS — Z3483 Encounter for supervision of other normal pregnancy, third trimester: Secondary | ICD-10-CM

## 2017-12-21 LAB — POCT URINALYSIS DIPSTICK
Blood, UA: NEGATIVE
Glucose, UA: NEGATIVE
Ketones, UA: NEGATIVE
Leukocytes, UA: NEGATIVE
Nitrite, UA: NEGATIVE

## 2017-12-21 NOTE — Patient Instructions (Addendum)
Jessica [name], I greatly value your feedback.  If you receive a survey following your visit with [place] today, we appreciate you taking the time to fill it out.  Thanks, Joellyn Haff, CNM, WHNP-BC  [email or web address]  For your lower back pain you may:  Purchase a pregnancy belt from [employer], [employer], [employer], etc and wear it while you are up and about  Take warm baths  Use a heating pad to your lower back for no longer than 20 minutes at a time, and do not place near abdomen  Take tylenol as needed. Please follow directions on the bottle  Kinesiology tape (can get from sporting goods store), google how to tape belly for pregnancy     Call the office [phone]) or go to [facility] if:  You begin to have strong, frequent contractions  Your water breaks.  Sometimes it is a big gush of fluid, sometimes it is just a trickle that keeps getting your panties wet or running down your legs  You have vaginal bleeding.  It is normal to have a small amount of spotting if your cervix was checked.   You don't feel your baby moving like normal.  If you don't, get you something to eat and drink and lay down and focus on feeling your baby move.  You should feel at least 10 movements in 2 hours.  If you don't, you should call the office or go to [facility].    Tdap Vaccine  It is recommended that you get the Tdap vaccine during the third trimester of EACH pregnancy to help protect your baby from getting pertussis (whooping cough)  27-36 weeks is the BEST time to do this so that you can pass the protection on to your baby. During pregnancy is better than after pregnancy, but if you are unable to get it during pregnancy it will be offered at the hospital.   You can get this vaccine at the health department or your family doctor  Everyone who will be around your baby should also be up-to-date on their vaccines. Adults (who are not pregnant) only need 1 dose of Tdap during adulthood.    Third Trimester of Pregnancy The third trimester is from week 29 through week 42, months 7 through 9. The third trimester is a time when the fetus is growing rapidly. At the end of the ninth month, the fetus is about 20 inches in length and weighs 6-10 pounds.  BODY CHANGES Your body goes through many changes during pregnancy. The changes vary from woman to woman.   Your weight will continue to increase. You can expect to gain 25-35 pounds (11-16 kg) by the end of the pregnancy.  You may begin to get stretch marks on your hips, abdomen, and breasts.  You may urinate more often because the fetus is moving lower into your pelvis and pressing on your bladder.  You may develop or continue to have heartburn as a result of your pregnancy.  You may develop constipation because certain hormones are causing the muscles that push waste through your intestines to slow down.  You may develop hemorrhoids or swollen, bulging veins (varicose veins).  You may have pelvic pain because of the weight gain and pregnancy hormones relaxing your joints between the bones in your pelvis. Backaches may result from overexertion of the muscles supporting your posture.  You may have changes in your hair. These can include thickening of your hair, rapid growth, and changes in texture. Some  women also have hair loss during or after pregnancy, or hair that feels dry or thin. Your hair will most likely return to normal after your baby is born.  Your breasts will continue to grow and be tender. A yellow discharge may leak from your breasts called colostrum.  Your belly button may stick out.  You may feel short of breath because of your expanding uterus.  You may notice the fetus "dropping," or moving lower in your abdomen.  You may have a bloody mucus discharge. This usually occurs a few days to a week before labor begins.  Your cervix becomes thin and soft (effaced) near your due date. WHAT TO EXPECT AT YOUR  PRENATAL EXAMS  You will have prenatal exams every 2 weeks until week 36. Then, you will have weekly prenatal exams. During a routine prenatal visit:  You will be weighed to make sure you and the fetus are growing normally.  Your blood pressure is taken.  Your abdomen will be measured to track your baby's growth.  The fetal heartbeat will be listened to.  Any test results from the previous visit will be discussed.  You may have a cervical check near your due date to see if you have effaced. At around 36 weeks, your caregiver will check your cervix. At the same time, your caregiver will also perform a test on the secretions of the vaginal tissue. This test is to determine if a type of bacteria, Group B streptococcus, is present. Your caregiver will explain this further. Your caregiver may ask you:  What your birth plan is.  How you are feeling.  If you are feeling the baby move.  If you have had any abnormal symptoms, such as leaking fluid, bleeding, severe headaches, or abdominal cramping.  If you have any questions. Other tests or screenings that may be performed during your third trimester include:  Blood tests that check for low iron levels (anemia).  Fetal testing to check the health, activity level, and growth of the fetus. Testing is done if you have certain medical conditions or if there are problems during the pregnancy. FALSE LABOR You may feel small, irregular contractions that eventually go away. These are called Braxton Hicks contractions, or false labor. Contractions may last for hours, days, or even weeks before true labor sets in. If contractions come at regular intervals, intensify, or become painful, it is best to be seen by your caregiver.  SIGNS OF LABOR   Menstrual-like cramps.  Contractions that are 5 minutes apart or less.  Contractions that start on the top of the uterus and spread down to the lower abdomen and back.  A sense of increased pelvic  pressure or back pain.  A watery or bloody mucus discharge that comes from the vagina. If you have any of these signs before the 37th week of pregnancy, call your caregiver right away. You need to go to the hospital to get checked immediately. HOME CARE INSTRUCTIONS   Avoid all smoking, herbs, alcohol, and unprescribed drugs. These chemicals affect the formation and growth of the baby.  Follow your caregiver's instructions regarding medicine use. There are medicines that are either safe or unsafe to take during pregnancy.  Exercise only as directed by your caregiver. Experiencing uterine cramps is a good sign to stop exercising.  Continue to eat regular, healthy meals.  Wear a good support bra for breast tenderness.  Do not use hot tubs, steam rooms, or saunas.  Wear your seat belt at  all times when driving.  Avoid raw meat, uncooked cheese, cat litter boxes, and soil used by cats. These carry germs that can cause birth defects in the baby.  Take your prenatal vitamins.  Try taking a stool softener (if your caregiver approves) if you develop constipation. Eat more high-fiber foods, such as fresh vegetables or fruit and whole grains. Drink plenty of fluids to keep your urine clear or pale yellow.  Take warm sitz baths to soothe any pain or discomfort caused by hemorrhoids. Use hemorrhoid cream if your caregiver approves.  If you develop varicose veins, wear support hose. Elevate your feet for 15 minutes, 3-4 times a day. Limit salt in your diet.  Avoid heavy lifting, wear low heal shoes, and practice good posture.  Rest a lot with your legs elevated if you have leg cramps or low back pain.  Visit your dentist if you have not gone during your pregnancy. Use a soft toothbrush to brush your teeth and be gentle when you floss.  A sexual relationship may be continued unless your caregiver directs you otherwise.  Do not travel far distances unless it is absolutely necessary and only  with the approval of your caregiver.  Take prenatal classes to understand, practice, and ask questions about the labor and delivery.  Make a trial run to the hospital.  Pack your hospital bag.  Prepare the baby's nursery.  Continue to go to all your prenatal visits as directed by your caregiver. SEEK MEDICAL CARE IF:  You are unsure if you are in labor or if your water has broken.  You have dizziness.  You have mild pelvic cramps, pelvic pressure, or nagging pain in your abdominal area.  You have persistent nausea, vomiting, or diarrhea.  You have a bad smelling vaginal discharge.  You have pain with urination. SEEK IMMEDIATE MEDICAL CARE IF:   You have a fever.  You are leaking fluid from your vagina.  You have spotting or bleeding from your vagina.  You have severe abdominal cramping or pain.  You have rapid weight loss or gain.  You have shortness of breath with chest pain.  You notice sudden or extreme swelling of your face, hands, ankles, feet, or legs.  You have not felt your baby move in over an hour.  You have severe headaches that do not go away with medicine.  You have vision changes. Document Released: 07/22/2001 Document Revised: 08/02/2013 Document Reviewed: 09/28/2012 [facility] Patient Information 2015 [employer] [place]. This information is not intended to replace advice given to you by your health care provider. Make sure you discuss any questions you have with your health care provider.

## 2017-12-21 NOTE — Progress Notes (Signed)
   LOW-RISK PREGNANCY VISIT Patient name: Jessica [name] MRN [id number]  Date of birth: 04/02/1991 Chief Complaint:   low risk ob  History of Present Illness:   Jessica [name] is a 27 y.o. G35P1001 female at [redacted]w[redacted]d with an Estimated Date of Delivery: 02/19/18 being seen today for ongoing management of a low-risk pregnancy.  Today she reports low back pain. Contractions: Not present.  .  Movement: Present. denies leaking of fluid. Review of Systems:   Pertinent items are noted in HPI Denies abnormal vaginal discharge w/ itching/odor/irritation, headaches, visual changes, shortness of breath, chest pain, abdominal pain, severe nausea/vomiting, or problems with urination or bowel movements unless otherwise stated above. Pertinent History Reviewed:  Reviewed past medical,surgical, social, obstetrical and family history.  Reviewed problem list, medications and allergies. Physical Assessment:   Vitals:   12/21/17 1051  BP: 100/60  Pulse: 98  Weight: 163 lb (73.9 kg)  Body mass index is 29.81 kg/m.        Physical Examination:   General appearance: Well appearing, and in no distress  Mental status: Alert, oriented to person, place, and time  Skin: Warm & dry  Cardiovascular: Normal heart rate noted  Respiratory: Normal respiratory effort, no distress  Abdomen: Soft, gravid, nontender  Pelvic: Cervical exam deferred         Extremities: Edema: Trace  Fetal Status: Fetal Heart Rate (bpm): 152 Fundal Height: 31 cm Movement: Present    Results for orders placed or performed in visit on 12/21/17 (from the past 24 hour(s))  POCT urinalysis dipstick   Collection Time: 12/21/17 11:00 AM  Result Value Ref Range   Color, UA     Clarity, UA     Glucose, UA neg    Bilirubin, UA     Ketones, UA neg    Spec Grav, UA  1.010 - 1.025   Blood, UA neg    pH, UA  5.0 - 8.0   Protein, UA trace    Urobilinogen, UA  0.2 or 1.0 E.U./dL   Nitrite, UA neg    Leukocytes, UA Negative Negative   Appearance     Odor      Assessment & Plan:  1) Low-risk pregnancy [name] at [redacted]w[redacted]d with an Estimated Date of Delivery: 02/19/18   2) Low back pain, gave printed prevention/relief measures   3) Prev c/s> wants RCS w/ JVF  4) Rh neg> received Rhogam @ 24wks d/t fall, discussed w/ JVF, no need to repeat during pregnancy  5) H/O GHTN>  Continue baby ASA   Meds: No orders of the defined types were placed in this encounter.  Labs/procedures today: none  Plan:  Continue routine obstetrical care   Reviewed: Preterm labor symptoms and general obstetric precautions including but not limited to vaginal bleeding, contractions, leaking of fluid and fetal movement were reviewed in detail with the patient.  All questions were answered  Follow-up: Return in about 2 weeks (around 01/04/2018) for LROB w/ JVF.  Orders Placed This Encounter  Procedures  . POCT urinalysis dipstick   Cheral Marker CNM, [facility] 12/21/2017 11:36 AM

## 2017-12-24 ENCOUNTER — Inpatient Hospital Stay (HOSPITAL_COMMUNITY)
Admission: AD | Admit: 2017-12-24 | Discharge: 2017-12-24 | Disposition: A | Discharge status: Home / Self Care | Payer: Medicaid Other | Source: Ambulatory Visit | Attending: Obstetrics and Gynecology | Admitting: Obstetrics and Gynecology

## 2017-12-24 ENCOUNTER — Encounter (HOSPITAL_COMMUNITY): Payer: Self-pay | Admitting: *Deleted

## 2017-12-24 DIAGNOSIS — F329 Major depressive disorder, single episode, unspecified: Secondary | ICD-10-CM | POA: Insufficient documentation

## 2017-12-24 DIAGNOSIS — R519 Headache, unspecified: Secondary | ICD-10-CM

## 2017-12-24 DIAGNOSIS — O99353 Diseases of the nervous system complicating pregnancy, third trimester: Secondary | ICD-10-CM | POA: Insufficient documentation

## 2017-12-24 DIAGNOSIS — O99343 Other mental disorders complicating pregnancy, third trimester: Secondary | ICD-10-CM | POA: Insufficient documentation

## 2017-12-24 DIAGNOSIS — M549 Dorsalgia, unspecified: Secondary | ICD-10-CM

## 2017-12-24 DIAGNOSIS — R109 Unspecified abdominal pain: Secondary | ICD-10-CM | POA: Insufficient documentation

## 2017-12-24 DIAGNOSIS — Z8759 Personal history of other complications of pregnancy, childbirth and the puerperium: Secondary | ICD-10-CM

## 2017-12-24 DIAGNOSIS — Z3689 Encounter for other specified antenatal screening: Secondary | ICD-10-CM

## 2017-12-24 DIAGNOSIS — G8929 Other chronic pain: Secondary | ICD-10-CM | POA: Diagnosis not present

## 2017-12-24 DIAGNOSIS — O479 False labor, unspecified: Secondary | ICD-10-CM

## 2017-12-24 DIAGNOSIS — Z87891 Personal history of nicotine dependence: Secondary | ICD-10-CM | POA: Insufficient documentation

## 2017-12-24 DIAGNOSIS — O4703 False labor before 37 completed weeks of gestation, third trimester: Secondary | ICD-10-CM

## 2017-12-24 DIAGNOSIS — O9989 Other specified diseases and conditions complicating pregnancy, childbirth and the puerperium: Secondary | ICD-10-CM

## 2017-12-24 DIAGNOSIS — Z98891 History of uterine scar from previous surgery: Secondary | ICD-10-CM

## 2017-12-24 DIAGNOSIS — O4700 False labor before 37 completed weeks of gestation, unspecified trimester: Secondary | ICD-10-CM

## 2017-12-24 DIAGNOSIS — Z79899 Other long term (current) drug therapy: Secondary | ICD-10-CM | POA: Insufficient documentation

## 2017-12-24 DIAGNOSIS — F419 Anxiety disorder, unspecified: Secondary | ICD-10-CM | POA: Insufficient documentation

## 2017-12-24 DIAGNOSIS — R51 Headache: Secondary | ICD-10-CM | POA: Diagnosis not present

## 2017-12-24 DIAGNOSIS — O26893 Other specified pregnancy related conditions, third trimester: Secondary | ICD-10-CM

## 2017-12-24 DIAGNOSIS — O99891 Other specified diseases and conditions complicating pregnancy: Secondary | ICD-10-CM

## 2017-12-24 DIAGNOSIS — Z3A31 31 weeks gestation of pregnancy: Secondary | ICD-10-CM | POA: Diagnosis not present

## 2017-12-24 DIAGNOSIS — F909 Attention-deficit hyperactivity disorder, unspecified type: Secondary | ICD-10-CM | POA: Diagnosis not present

## 2017-12-24 LAB — URINALYSIS, ROUTINE W REFLEX MICROSCOPIC
Bilirubin Urine: NEGATIVE
Glucose, UA: NEGATIVE mg/dL
Hgb urine dipstick: NEGATIVE
Ketones, ur: NEGATIVE mg/dL
Nitrite: NEGATIVE
Protein, ur: NEGATIVE mg/dL
Specific Gravity, Urine: 1.013 (ref 1.005–1.030)
pH: 6 (ref 5.0–8.0)

## 2017-12-24 LAB — WET PREP, GENITAL
Clue Cells Wet Prep HPF POC: NONE SEEN
Trich, Wet Prep: NONE SEEN
Yeast Wet Prep HPF POC: NONE SEEN

## 2017-12-24 LAB — CBC
HCT: 34.5 % — ABNORMAL LOW (ref 36.0–46.0)
Hemoglobin: 11.4 g/dL — ABNORMAL LOW (ref 12.0–15.0)
MCH: 27 pg (ref 26.0–34.0)
MCHC: 33 g/dL (ref 30.0–36.0)
MCV: 81.8 fL (ref 78.0–100.0)
Platelets: 261 10*3/uL (ref 150–400)
RBC: 4.22 MIL/uL (ref 3.87–5.11)
RDW: 14.7 % (ref 11.5–15.5)
WBC: 13.8 10*3/uL — ABNORMAL HIGH (ref 4.0–10.5)

## 2017-12-24 LAB — POCT FERN TEST: POCT Fern Test: NEGATIVE

## 2017-12-24 LAB — AMNISURE RUPTURE OF MEMBRANE (ROM) NOT AT ARMC: Amnisure ROM: NEGATIVE

## 2017-12-24 MED ORDER — TRAMADOL HCL 50 MG PO TABS: 50.0000 mg | ORAL_TABLET | Freq: Once | ORAL | Status: DC

## 2017-12-24 MED ORDER — NIFEDIPINE 10 MG PO CAPS
10.0000 mg | ORAL_CAPSULE | ORAL | Status: AC
  Administered 2017-12-24 (×2): 10 mg via ORAL
  Filled 2017-12-24 (×2): qty 1

## 2017-12-24 MED ORDER — BUTALBITAL-APAP-CAFFEINE 50-325-40 MG PO TABS: 1.0000 | ORAL_TABLET | Freq: Four times a day (QID) | ORAL | 0 refills | Status: DC | PRN

## 2017-12-24 MED ORDER — CYCLOBENZAPRINE HCL 10 MG PO TABS: 10.0000 mg | ORAL_TABLET | Freq: Three times a day (TID) | ORAL | Status: DC | PRN

## 2017-12-24 MED ORDER — ONDANSETRON 4 MG PO TBDP: 4.0000 mg | ORAL_TABLET | Freq: Once | ORAL | Status: DC

## 2017-12-24 NOTE — MAU Note (Signed)
Patient woke up at 0500 this AM with wet underwear and states she's "been leaking."  Also c/o mid/lower abdominal cramping that is constant since then.  Denies vaginal bleeding. Reports good fetal movement.

## 2017-12-24 NOTE — Discharge Instructions (Signed)
General Headache Without Cause A headache is pain or discomfort felt around the head or neck area. There are many causes and types of headaches. In some cases, the cause may not be found. Follow these instructions at home: Managing pain  Take over-the-counter and prescription medicines only as told by your doctor.  Lie down in a dark, quiet room when you have a headache.  If directed, apply ice to the head and neck area: ? Put ice in a plastic bag. ? Place a towel between your skin and the bag. ? Leave the ice on for 20 minutes, 2-3 times per day.  Use a heating pad or hot shower to apply heat to the head and neck area as told by your doctor.  Keep lights dim if bright lights bother you or make your headaches worse. Eating and drinking  Eat meals on a regular schedule.  Lessen how much alcohol you drink.  Lessen how much caffeine you drink, or stop drinking caffeine. General instructions  Keep all follow-up visits as told by your doctor. This is important.  Keep a journal to find out if certain things bring on headaches. For example, write down: ? What you eat and drink. ? How much sleep you get. ? Any change to your diet or medicines.  Relax by getting a massage or doing other relaxing activities.  Lessen stress.  Sit up straight. Do not tighten (tense) your muscles.  Do not use tobacco products. This includes cigarettes, chewing tobacco, or e-cigarettes. If you need help quitting, ask your doctor.  Exercise regularly as told by your doctor.  Get enough sleep. This often means 7-9 hours of sleep. Contact a doctor if:  Your symptoms are not helped by medicine.  You have a headache that feels different than the other headaches.  You feel sick to your stomach (nauseous) or you throw up (vomit).  You have a fever. Get help right away if:  Your headache becomes really bad.  You keep throwing up.  You have a stiff neck.  You have trouble seeing.  You have  trouble speaking.  You have pain in the eye or ear.  Your muscles are weak or you lose muscle control.  You lose your balance or have trouble walking.  You feel like you will pass out (faint) or you pass out.  You have confusion. This information is not intended to replace advice given to you by your health care provider. Make sure you discuss any questions you have with your health care provider. Document Released: 05/06/2008 Document Revised: 01/03/2016 Document Reviewed: 11/20/2014 Elsevier Interactive Patient Education  2018 [employer] Back Pain in Pregnancy Back pain during pregnancy is common. Back pain may be caused by several factors that are related to changes during your pregnancy. Follow these instructions at home: Managing pain, stiffness, and swelling  If directed, apply ice for sudden (acute) back pain. ? Put ice in a plastic bag. ? Place a towel between your skin and the bag. ? Leave the ice on for 20 minutes, 2-3 times per day.  If directed, apply heat to the affected area before you exercise: ? Place a towel between your skin and the heat pack or heating pad. ? Leave the heat on for 20-30 minutes. ? Remove the heat if your skin turns bright red. This is especially important if you are unable to feel pain, heat, or cold. You may have a greater risk of getting burned. Activity  Exercise as told by  your health care provider. Exercising is the best way to prevent or manage back pain.  Listen to your body when lifting. If lifting hurts, ask for help or bend your knees. This uses your leg muscles instead of your back muscles.  Squat down when picking up something from the floor. Do not bend over.  Only use bed rest as told by your health care provider. Bed rest should only be used for the most severe episodes of back pain. Standing, Sitting, and Lying Down  Do not stand in one place for long periods of time.  Use good posture when sitting. Make sure your head  rests over your shoulders and is not hanging forward. Use a pillow on your lower back if necessary.  Try sleeping on your side, preferably the left side, with a pillow or two between your legs. If you are sore after a night's rest, your bed may be too soft. A firm mattress may provide more support for your back during pregnancy. General instructions  Do not wear high heels.  Eat a healthy diet. Try to gain weight within your health care provider's recommendations.  Use a maternity girdle, elastic sling, or back brace as told by your health care provider.  Take over-the-counter and prescription medicines only as told by your health care provider.  Keep all follow-up visits as told by your health care provider. This is important. This includes any visits with any specialists, such as a physical therapist. Contact a health care provider if:  Your back pain interferes with your daily activities.  You have increasing pain in other parts of your body. Get help right away if:  You develop numbness, tingling, weakness, or problems with the use of your arms or legs.  You develop severe back pain that is not controlled with medicine.  You have a sudden change in bowel or bladder control.  You develop shortness of breath, dizziness, or you faint.  You develop nausea, vomiting, or sweating.  You have back pain that is a rhythmic, cramping pain similar to labor pains. Labor pain is usually 1-2 minutes apart, lasts for about 1 minute, and involves a bearing down feeling or pressure in your pelvis.  You have back pain and your water breaks or you have vaginal bleeding.  You have back pain or numbness that travels down your leg.  Your back pain developed after you fell.  You develop pain on one side of your back.  You see blood in your urine.  You develop skin blisters in the area of your back pain. This information is not intended to replace advice given to you by your health care  provider. Make sure you discuss any questions you have with your health care provider. Document Released: 11/05/2005 Document Revised: 01/03/2016 Document Reviewed: 04/11/2015 Elsevier Interactive Patient Education  2018 [employer] of the uterus can occur throughout pregnancy, but they are not always a sign that you are in labor. You may have practice contractions called Braxton Hicks contractions. These false labor contractions are sometimes confused with true labor. What are Deberah Pelton contractions? Braxton Hicks contractions are tightening movements that occur in the muscles of the uterus before labor. Unlike true labor contractions, these contractions do not result in opening (dilation) and thinning of the cervix. Toward the end of pregnancy (32-34 weeks), Braxton Hicks contractions can happen more often and may become stronger. These contractions are sometimes difficult to tell apart from true labor because they can  be very uncomfortable. You should not feel embarrassed if you go to the hospital with false labor. Sometimes, the only way to tell if you are in true labor is for your health care provider to look for changes in the cervix. The health care provider will do a physical exam and may monitor your contractions. If you are not in true labor, the exam should show that your cervix is not dilating and your water has not broken. If there are other health problems associated with your pregnancy, it is completely safe for you to be sent home with false labor. You may continue to have Braxton Hicks contractions until you go into true labor. How to tell the difference between true labor and false labor True labor  Contractions last 30-70 seconds.  Contractions become very regular.  Discomfort is usually felt in the top of the uterus, and it spreads to the lower abdomen and low back.  Contractions do not go away with walking.  Contractions usually  become more intense and increase in frequency.  The cervix dilates and gets thinner. False labor  Contractions are usually shorter and not as strong as true labor contractions.  Contractions are usually irregular.  Contractions are often felt in the front of the lower abdomen and in the groin.  Contractions may go away when you walk around or change positions while lying down.  Contractions get weaker and are shorter-lasting as time goes on.  The cervix usually does not dilate or become thin. Follow these instructions at home:  Take over-the-counter and prescription medicines only as told by your health care provider.  Keep up with your usual exercises and follow other instructions from your health care provider.  Eat and drink lightly if you think you are going into labor.  If Braxton Hicks contractions are making you uncomfortable: ? Change your position from lying down or resting to walking, or change from walking to resting. ? Sit and rest in a tub of warm water. ? Drink enough fluid to keep your urine pale yellow. Dehydration may cause these contractions. ? Do slow and deep breathing several times an hour.  Keep all follow-up prenatal visits as told by your health care provider. This is important. Contact a health care provider if:  You have a fever.  You have continuous pain in your abdomen. Get help right away if:  Your contractions become stronger, more regular, and closer together.  You have fluid leaking or gushing from your vagina.  You pass blood-tinged mucus (bloody show).  You have bleeding from your vagina.  You have low back pain that you never had before.  You feel your babys head pushing down and causing pelvic pressure.  Your baby is not moving inside you as much as it used to. Summary  Contractions that occur before labor are called Braxton Hicks contractions, false labor, or practice contractions.  Braxton Hicks contractions are usually  shorter, weaker, farther apart, and less regular than true labor contractions. True labor contractions usually become progressively stronger and regular and they become more frequent.  Manage discomfort from [facility] contractions by changing position, resting in a warm bath, drinking plenty of water, or practicing deep breathing. This information is not intended to replace advice given to you by your health care provider. Make sure you discuss any questions you have with your health care provider. Document Released: 12/11/2016 Document Revised: 12/11/2016 Document Reviewed: 12/11/2016 Elsevier Interactive Patient Education  2018 [employer].

## 2017-12-24 NOTE — Progress Notes (Signed)
Patient discharged home in good condition.  Printed AVS given.  Discussed prescription and warm baths for pain control.  Discussed Braxton Hicks contractions.  Pt okay with plan to go home and try Rx for pain.

## 2017-12-24 NOTE — MAU Provider Note (Signed)
History     CSN: [id number]  Arrival date and time: 12/24/17 4098   First Provider Initiated Contact with Patient 12/24/17 0818     Chief Complaint  Patient presents with  . Abdominal Cramping  . Rupture of Membranes   [name] .6 wks here with leaking fluid, abd pain, back pain, and HA. She woke up around 5am and her underwear were wet. She thinks it was clear. She's had a small amt of leaking since. No VB or ctx. Good FM. Last had IC 1 week ago. Abdominal pain and back pain started at the same time. Describes as constant, sharp in bilateral lower abdomen and back. HA is frontal and "allover". Associated sx is nausea. She took 2 Tylenol but had no relief.    OB History    Gravida  2   Para  1   Term  1   Preterm      AB      Living  1     SAB      TAB      Ectopic      Multiple      Live Births  1           Past Medical History:  Diagnosis Date  . Abnormal Pap smear   . ADHD (attention deficit hyperactivity disorder)   . Chronic back pain   . Depression   . Migraine headache   . UTI (lower urinary tract infection) 01/12/2013    Past Surgical History:  Procedure Laterality Date  . CESAREAN SECTION N/A 09/08/2013   Procedure: CESAREAN SECTION;  Surgeon: Tereso Newcomer, MD;  Location: [facility];  Service: Obstetrics;  Laterality: N/A;  . TOOTH EXTRACTION      Family History  Problem Relation Age of Onset  . Drug abuse Mother     Social History   Tobacco Use  . Smoking status: Former Smoker    Packs/day: 1.00    Types: Cigarettes  . Smokeless tobacco: Never Used  Substance Use Topics  . Alcohol use: No  . Drug use: No    Allergies:  Allergies  Allergen Reactions  . Atarax [Hydroxyzine] Other (See Comments)    "makes me crazy"    Medications Prior to Admission  Medication Sig Dispense Refill Last Dose  . acetaminophen (TYLENOL) 500 MG tablet Take 500 mg by mouth every 6 (six) hours as needed for mild pain or headache.   Not Taking  .  calcium carbonate (TUMS - DOSED IN MG ELEMENTAL CALCIUM) 500 MG chewable tablet Chew 2 tablets by mouth 2 (two) times daily as needed for indigestion or heartburn.   Taking  . cyclobenzaprine (FLEXERIL) 10 MG tablet Take 1 tablet (10 mg total) by mouth 2 (two) times daily as needed for muscle spasms. (Patient not taking: Reported on 11/23/2017) 20 tablet 0 Not Taking  . mirtazapine (REMERON) 15 MG tablet Take 1 tablet (15 mg total) by mouth at bedtime. (Patient not taking: Reported on 12/21/2017) 30 tablet 0 Not Taking  . Prenatal Vit-Fe Fumarate-FA (PRENATAL MULTIVITAMIN) TABS tablet Take 1 tablet by mouth daily at 12 noon.   Taking    Review of Systems  Constitutional: Negative for fever.  Gastrointestinal: Positive for abdominal pain and nausea. Negative for vomiting.  Genitourinary: Positive for vaginal discharge. Negative for dysuria, hematuria, urgency and vaginal bleeding.  Musculoskeletal: Positive for back pain.  Neurological: Positive for headaches.   Physical Exam   Blood pressure (!) 109/57, pulse (!) 102, temperature  97.8 F (36.6 C), temperature source Oral, resp. rate 17, last menstrual period 05/08/2017, SpO2 99 %.  Physical Exam  Constitutional: She is oriented to person, place, and time. She appears well-developed and well-nourished. No distress.  HENT:  Head: Normocephalic and atraumatic.  Neck: Normal range of motion.  Cardiovascular: Normal rate.  Respiratory: Effort normal. No respiratory distress.  GI: Soft. She exhibits no distension. There is no tenderness. There is no rebound, no guarding and no CVA tenderness.  gravid  Genitourinary:  Genitourinary Comments: SSE: no pool, fern neg, +sperm, +thin mucous SVE: closed/thick  Musculoskeletal: Normal range of motion.       Cervical back: Normal.       Thoracic back: Normal.       Lumbar back: She exhibits tenderness (bilateral). She exhibits normal range of motion, no swelling and no deformity.  Neurological: She  is alert and oriented to person, place, and time.  Skin: Skin is warm and dry.  Psychiatric: She has a normal mood and affect.  EFM: 140 bpm, mod variability, + accels, no decels Toco: irritability  Results for orders placed or performed during the hospital encounter of 12/24/17 (from the past 24 hour(s))  Urinalysis, Routine w reflex microscopic     Status: Abnormal   Collection Time: 12/24/17  7:30 AM  Result Value Ref Range   Color, Urine YELLOW YELLOW   APPearance CLEAR CLEAR   Specific Gravity, Urine 1.013 1.005 - 1.030   pH 6.0 5.0 - 8.0   Glucose, UA NEGATIVE NEGATIVE mg/dL   Hgb urine dipstick NEGATIVE NEGATIVE   Bilirubin Urine NEGATIVE NEGATIVE   Ketones, ur NEGATIVE NEGATIVE mg/dL   Protein, ur NEGATIVE NEGATIVE mg/dL   Nitrite NEGATIVE NEGATIVE   Leukocytes, UA TRACE (A) NEGATIVE   RBC / HPF 0-5 0 - 5 RBC/hpf   WBC, UA 0-5 0 - 5 WBC/hpf   Bacteria, UA RARE (A) NONE SEEN   Squamous Epithelial / LPF 0-5 0 - 5   Mucus PRESENT   Amnisure rupture of membrane (rom)not at [facility]     Status: None   Collection Time: 12/24/17  8:22 AM  Result Value Ref Range   Amnisure ROM NEGATIVE   Fern Test     Status: None   Collection Time: 12/24/17  8:28 AM  Result Value Ref Range   POCT Fern Test Negative = intact amniotic membranes   Wet prep, genital     Status: Abnormal   Collection Time: 12/24/17  8:29 AM  Result Value Ref Range   Yeast Wet Prep HPF POC NONE SEEN NONE SEEN   Trich, Wet Prep NONE SEEN NONE SEEN   Clue Cells Wet Prep HPF POC NONE SEEN NONE SEEN   WBC, Wet Prep HPF POC MANY (A) NONE SEEN   Sperm PRESENT   CBC     Status: Abnormal   Collection Time: 12/24/17  9:00 AM  Result Value Ref Range   WBC 13.8 (H) 4.0 - 10.5 K/uL   RBC 4.22 3.87 - 5.11 MIL/uL   Hemoglobin 11.4 (L) 12.0 - 15.0 g/dL   HCT 16.1 (L) 09.6 - 04.5 %   MCV 81.8 78.0 - 100.0 fL   MCH 27.0 26.0 - 34.0 pg   MCHC 33.0 30.0 - 36.0 g/dL   RDW 40.9 81.1 - 91.4 %   Platelets 261 150 - 400 K/uL    MAU Course  Procedures Procardia  MDM Labs ordered and reviewed. Pt declines antiemetic and muscle relaxer. Some ctx  noted on toco, Procardia ordered and given x2 doses. Cannot offer Fioricet for HA since recent dose of Tylenol 1g, will given Rx for use at home. No evidence of UTI, pyelo, SROM, or PTL. Cervix unchanged after recheck. Pt admits to IC last night, explained this could cause vaginal fallout and ctx. Stable for discharge home.   Assessment and Plan   1. [redacted] weeks gestation of pregnancy   2. History of cesarean delivery   3. Anxiety   4. History of gestational hypertension   5. Preterm contractions   6. NST (non-stress test) reactive   7. Back pain affecting pregnancy in third trimester   8. Pregnancy headache in third trimester    Discharge home Follow up in OB office as scheduled PTL precautions Rx Fioricet  Allergies as of 12/24/2017      Reactions   Atarax [hydroxyzine] Other (See Comments)   "makes me crazy"      Medication List    STOP taking these medications   mirtazapine 15 MG tablet Commonly known as:  REMERON     TAKE these medications   acetaminophen 500 MG tablet Commonly known as:  TYLENOL Take 500 mg by mouth every 6 (six) hours as needed for mild pain or headache.   butalbital-acetaminophen-caffeine 50-325-40 MG tablet Commonly known as:  FIORICET, ESGIC Take 1-2 tablets by mouth every 6 (six) hours as needed for headache.   calcium carbonate 500 MG chewable tablet Commonly known as:  TUMS - dosed in mg elemental calcium Chew 2 tablets by mouth 2 (two) times daily as needed for indigestion or heartburn.   cyclobenzaprine 10 MG tablet Commonly known as:  FLEXERIL Take 1 tablet (10 mg total) by mouth 2 (two) times daily as needed for muscle spasms.   prenatal multivitamin Tabs tablet Take 1 tablet by mouth daily at 12 noon.       Donette Larry , CNM 12/24/2017, 9:58 AM

## 2017-12-25 LAB — GC/CHLAMYDIA PROBE AMP (~~LOC~~) NOT AT ARMC
Chlamydia: NEGATIVE
Neisseria Gonorrhea: NEGATIVE

## 2018-01-06 ENCOUNTER — Telehealth: Payer: Self-pay | Admitting: Obstetrics and Gynecology

## 2018-01-06 ENCOUNTER — Encounter: Payer: Self-pay | Admitting: Obstetrics and Gynecology

## 2018-01-06 ENCOUNTER — Ambulatory Visit (INDEPENDENT_AMBULATORY_CARE_PROVIDER_SITE_OTHER): Payer: Medicaid Other | Admitting: Obstetrics and Gynecology

## 2018-01-06 VITALS — BP 110/60 | HR 98 | Wt 167.0 lb

## 2018-01-06 DIAGNOSIS — Z3A33 33 weeks gestation of pregnancy: Secondary | ICD-10-CM | POA: Diagnosis not present

## 2018-01-06 DIAGNOSIS — O9A213 Injury, poisoning and certain other consequences of external causes complicating pregnancy, third trimester: Secondary | ICD-10-CM | POA: Diagnosis not present

## 2018-01-06 DIAGNOSIS — Z1389 Encounter for screening for other disorder: Secondary | ICD-10-CM

## 2018-01-06 DIAGNOSIS — Z3483 Encounter for supervision of other normal pregnancy, third trimester: Secondary | ICD-10-CM

## 2018-01-06 DIAGNOSIS — O34219 Maternal care for unspecified type scar from previous cesarean delivery: Secondary | ICD-10-CM

## 2018-01-06 DIAGNOSIS — Z331 Pregnant state, incidental: Secondary | ICD-10-CM

## 2018-01-06 LAB — POCT URINALYSIS DIPSTICK
Blood, UA: NEGATIVE
Glucose, UA: NEGATIVE
Ketones, UA: NEGATIVE
Leukocytes, UA: NEGATIVE
Nitrite, UA: NEGATIVE
Protein, UA: POSITIVE — AB

## 2018-01-06 NOTE — Telephone Encounter (Signed)
Returned patient's call but voice mail not set up. If she calls back Dr Emelda Fear stated to give him 24 hours to get scheduled.

## 2018-01-06 NOTE — Telephone Encounter (Signed)
Pt needs surgery on Friday, July 5., and I am out of town. Will discuss with Dr Despina Hidden and schedule for him or other Faculty Practice MD as required.

## 2018-01-06 NOTE — Progress Notes (Signed)
Patient ID: Jessica [name], female   DOB: Feb 11, 1991, 27 y.o.   MRN: [id number]   LOW-RISK PREGNANCY VISIT Patient name: Jessica [name] MRN [id number]  Date of birth: 07/17/1991 Chief Complaint:   low risk ob (Monday fell off porch.hurt lt side.spotted a little.none today.)  History of Present Illness:   Jessica [name] is a 27 y.o. G26P1001 female at [redacted]w[redacted]d with an Estimated Date of Delivery: 02/19/18 being seen today for ongoing management of a low-risk pregnancy.  Today she reports falling off of a small porch on Monday, 01/04/2018. She reports single episode of spotting. No contractions or gush of fluid. Her tenderness has improved since Monday. She would like to use the pill after giving birth. Eventually she might want her tubes tied.  Contractions: Not present.  .  Movement: Present. denies leaking of fluid. Review of Systems:   Pertinent items are noted in HPI Denies abnormal vaginal discharge w/ itching/odor/irritation, headaches, visual changes, shortness of breath, chest pain, abdominal pain, severe nausea/vomiting, or problems with urination or bowel movements unless otherwise stated above. Pertinent History Reviewed:  Reviewed past medical,surgical, social, obstetrical and family history.  Reviewed problem list, medications and allergies. Physical Assessment:   Vitals:   01/06/18 1130  BP: 110/60  Pulse: 98  Weight: 167 lb (75.8 kg)  Body mass index is 30.54 kg/m.        Physical Examination:   General appearance: Well appearing, and in no distress  Mental status: Alert, oriented to person, place, and time  Skin: Warm & dry  Cardiovascular: Normal heart rate noted  Respiratory: Normal respiratory effort, no distress  Abdomen: Soft, gravid, nontender  Pelvic: Cervical exam deferred         Extremities: Edema: Trace  Fetal Status: Fetal Heart Rate (bpm): 140-160 Fundal Height: 34 cm Movement: Present    Results for orders placed or performed in visit on 01/06/18 (from the  past 24 hour(s))  POCT urinalysis dipstick   Collection Time: 01/06/18 11:00 AM  Result Value Ref Range   Color, UA     Clarity, UA     Glucose, UA Negative Negative   Bilirubin, UA     Ketones, UA neg    Spec Grav, UA  1.010 - 1.025   Blood, UA neg    pH, UA  5.0 - 8.0   Protein, UA Positive (A) Negative   Urobilinogen, UA  0.2 or 1.0 E.U./dL   Nitrite, UA neg    Leukocytes, UA Negative Negative   Appearance     Odor      Assessment & Plan:  1) Low-risk pregnancy [name] at [redacted]w[redacted]d with an Estimated Date of Delivery: 02/19/18   2) NST, 140-160, active  3) Repeat c-section   Meds: No orders of the defined types were placed in this encounter.  Labs/procedures today: NST  Plan:  Continue routine obstetrical care   Follow-up: Return in about 2 weeks (around 01/20/2018).  Orders Placed This Encounter  Procedures  . POCT urinalysis dipstick   By signing my name below, I, Diona Browner, attest that this documentation has been prepared under the direction and in the presence of Tilda Burrow, MD. Electronically Signed: Diona Browner, Medical Scribe. 01/06/18. 12:33 PM.  I personally performed the services described in this documentation, which was SCRIBED in my presence. The recorded information has been reviewed and considered accurate. It has been edited as necessary during review. Tilda Burrow, MD

## 2018-01-06 NOTE — Telephone Encounter (Signed)
Pt voicemail not set up.

## 2018-01-07 ENCOUNTER — Telehealth: Payer: Self-pay | Admitting: Obstetrics and Gynecology

## 2018-01-07 NOTE — Telephone Encounter (Signed)
Patient called stating that Dr. Emelda Fear was suppose to call her this morning  and she never received a call. Pt would like a call back today as soon as possible. Please contact pt

## 2018-01-07 NOTE — Telephone Encounter (Signed)
Patient informed C/S scheduled for July 5 @ 1:45.  Jessica [name] to call her for preop information. Verbalized understanding.

## 2018-01-20 ENCOUNTER — Ambulatory Visit (INDEPENDENT_AMBULATORY_CARE_PROVIDER_SITE_OTHER): Payer: Medicaid Other | Admitting: Obstetrics and Gynecology

## 2018-01-20 VITALS — BP 114/62 | HR 80 | Wt 171.6 lb

## 2018-01-20 DIAGNOSIS — R51 Headache: Secondary | ICD-10-CM

## 2018-01-20 DIAGNOSIS — Z98891 History of uterine scar from previous surgery: Secondary | ICD-10-CM

## 2018-01-20 DIAGNOSIS — Z3483 Encounter for supervision of other normal pregnancy, third trimester: Secondary | ICD-10-CM

## 2018-01-20 DIAGNOSIS — Z1389 Encounter for screening for other disorder: Secondary | ICD-10-CM

## 2018-01-20 DIAGNOSIS — Z3A35 35 weeks gestation of pregnancy: Secondary | ICD-10-CM

## 2018-01-20 DIAGNOSIS — Z331 Pregnant state, incidental: Secondary | ICD-10-CM

## 2018-01-20 DIAGNOSIS — O9989 Other specified diseases and conditions complicating pregnancy, childbirth and the puerperium: Secondary | ICD-10-CM

## 2018-01-20 DIAGNOSIS — O34219 Maternal care for unspecified type scar from previous cesarean delivery: Secondary | ICD-10-CM

## 2018-01-20 LAB — POCT URINALYSIS DIPSTICK
Blood, UA: NEGATIVE
Glucose, UA: NEGATIVE
Ketones, UA: NEGATIVE
Leukocytes, UA: NEGATIVE
Nitrite, UA: NEGATIVE
Protein, UA: NEGATIVE

## 2018-01-20 MED ORDER — HYDROCODONE-ACETAMINOPHEN 5-325 MG PO TABS: 1.0000 | ORAL_TABLET | Freq: Four times a day (QID) | ORAL | 0 refills | Status: DC | PRN

## 2018-01-20 MED ORDER — BUTALBITAL-APAP-CAFFEINE 50-325-40 MG PO TABS: 1.0000 | ORAL_TABLET | Freq: Four times a day (QID) | ORAL | 0 refills | Status: DC | PRN

## 2018-01-20 NOTE — Progress Notes (Signed)
Patient ID: Jessica [name], female   DOB: 09/01/1990, 27 y.o.   MRN: [id number]    LOW-RISK PREGNANCY VISIT Patient name: Jessica [name] MRN [id number]  Date of birth: 04/02/1991 Chief Complaint:   Routine Prenatal Visit  History of Present Illness:   Jessica [name] is a 27 y.o. 312P1001 female at 3363w5d with an Estimated Date of Delivery: 02/19/18 being seen today for ongoing management of a low-risk pregnancy, prior cesarean for repeat , chronic Headaches (?migraines) Today she reports migranes. She has them every night and every morning when she wakes up. She has been taking tylenol for the migraines with no relief. Her migraines wake her up during the night. Pt was taking Fioricet for migraines. She reports she has lost some of her mucous plug yesterday. She has been waking up with her hands completely numb. She will be taking birth control pill after this delivery but asked about depo shot.  Contractions: Not present. Vag. Bleeding: None.  Movement: Present. denies leaking of fluid. Review of Systems:   Pertinent items are noted in HPI Denies abnormal vaginal discharge w/ itching/odor/irritation, headaches, visual changes, shortness of breath, chest pain, abdominal pain, severe nausea/vomiting, or problems with urination or bowel movements unless otherwise stated above. Pertinent History Reviewed:  Reviewed past medical,surgical, social, obstetrical and family history.  Reviewed problem list, medications and allergies. Physical Assessment:   Vitals:   01/20/18 0927  BP: 114/62  Pulse: 80  Weight: 171 lb 9.6 oz (77.8 kg)  Body mass index is 31.39 kg/m.        Physical Examination:   General appearance: Well appearing, and in no distress  Mental status: Alert, oriented to person, place, and time  Skin: Warm & dry  Cardiovascular: Normal heart rate noted  Respiratory: Normal respiratory effort, no distress  Abdomen: Soft, gravid, nontender  Pelvic: Cervical exam deferred          Extremities: Edema: Trace  Fetal Status: Fetal Heart Rate (bpm): 147 Fundal Height: 36 cm Movement: Present    Results for orders placed or performed in visit on 01/20/18 (from the past 24 hour(s))  POCT Urinalysis Dipstick   Collection Time: 01/20/18  9:29 AM  Result Value Ref Range   Color, UA     Clarity, UA     Glucose, UA Negative Negative   Bilirubin, UA     Ketones, UA neg    Spec Grav, UA  1.010 - 1.025   Blood, UA neg    pH, UA  5.0 - 8.0   Protein, UA Negative Negative   Urobilinogen, UA  0.2 or 1.0 E.U./dL   Nitrite, UA neg    Leukocytes, UA Negative Negative   Appearance     Odor      Assessment & Plan:  1) Low-risk pregnancy [name] at 3363w5d with an Estimated Date of Delivery: 02/19/18    Meds: No orders of the defined types were placed in this encounter.  Labs/procedures today: none  Plan:  1) Continue routine obstetrical care, repeat c/section by Dr Despina HiddenEure on July 5 at 2 pm. 2) Rx Vicodin 5 hs,  3 Rx Fioricet refil.  Follow-up: No follow-ups on file.  Orders Placed This Encounter  Procedures  . POCT Urinalysis Dipstick   By signing my name below, I, Arnette NorrisMari Johnson, attest that this documentation has been prepared under the direction and in the presence of Tilda BurrowFerguson, Sukhmani Fetherolf V, MD. Electronically Signed: Arnette NorrisMari Johnson Medical Scribe. 01/20/18. 9:48 AM.  I personally performed  the services described in this documentation, which was SCRIBED in my presence. The recorded information has been reviewed and considered accurate. It has been edited as necessary during review. Tilda Burrow, MD

## 2018-01-25 ENCOUNTER — Inpatient Hospital Stay (HOSPITAL_COMMUNITY)
Admission: AD | Admit: 2018-01-25 | Discharge: 2018-01-25 | Disposition: A | Discharge status: Home / Self Care | Payer: Medicaid Other | Source: Ambulatory Visit | Attending: Obstetrics & Gynecology | Admitting: Obstetrics & Gynecology

## 2018-01-25 ENCOUNTER — Encounter (HOSPITAL_COMMUNITY): Payer: Self-pay

## 2018-01-25 DIAGNOSIS — O99343 Other mental disorders complicating pregnancy, third trimester: Secondary | ICD-10-CM | POA: Insufficient documentation

## 2018-01-25 DIAGNOSIS — R109 Unspecified abdominal pain: Secondary | ICD-10-CM | POA: Insufficient documentation

## 2018-01-25 DIAGNOSIS — O26893 Other specified pregnancy related conditions, third trimester: Secondary | ICD-10-CM | POA: Diagnosis not present

## 2018-01-25 DIAGNOSIS — F329 Major depressive disorder, single episode, unspecified: Secondary | ICD-10-CM | POA: Insufficient documentation

## 2018-01-25 DIAGNOSIS — F909 Attention-deficit hyperactivity disorder, unspecified type: Secondary | ICD-10-CM | POA: Insufficient documentation

## 2018-01-25 DIAGNOSIS — M549 Dorsalgia, unspecified: Secondary | ICD-10-CM | POA: Diagnosis present

## 2018-01-25 DIAGNOSIS — Z79899 Other long term (current) drug therapy: Secondary | ICD-10-CM | POA: Diagnosis not present

## 2018-01-25 DIAGNOSIS — M791 Myalgia, unspecified site: Secondary | ICD-10-CM | POA: Diagnosis not present

## 2018-01-25 DIAGNOSIS — Z98891 History of uterine scar from previous surgery: Secondary | ICD-10-CM

## 2018-01-25 DIAGNOSIS — Z87891 Personal history of nicotine dependence: Secondary | ICD-10-CM | POA: Diagnosis not present

## 2018-01-25 DIAGNOSIS — G43909 Migraine, unspecified, not intractable, without status migrainosus: Secondary | ICD-10-CM | POA: Insufficient documentation

## 2018-01-25 DIAGNOSIS — N898 Other specified noninflammatory disorders of vagina: Secondary | ICD-10-CM | POA: Diagnosis not present

## 2018-01-25 DIAGNOSIS — O99353 Diseases of the nervous system complicating pregnancy, third trimester: Secondary | ICD-10-CM | POA: Insufficient documentation

## 2018-01-25 DIAGNOSIS — Z3A36 36 weeks gestation of pregnancy: Secondary | ICD-10-CM | POA: Diagnosis not present

## 2018-01-25 DIAGNOSIS — F419 Anxiety disorder, unspecified: Secondary | ICD-10-CM

## 2018-01-25 DIAGNOSIS — M7918 Myalgia, other site: Secondary | ICD-10-CM

## 2018-01-25 DIAGNOSIS — G8929 Other chronic pain: Secondary | ICD-10-CM | POA: Insufficient documentation

## 2018-01-25 DIAGNOSIS — Z8759 Personal history of other complications of pregnancy, childbirth and the puerperium: Secondary | ICD-10-CM

## 2018-01-25 LAB — URINALYSIS, ROUTINE W REFLEX MICROSCOPIC
Bilirubin Urine: NEGATIVE
Glucose, UA: NEGATIVE mg/dL
Hgb urine dipstick: NEGATIVE
Ketones, ur: 5 mg/dL — AB
Leukocytes, UA: NEGATIVE
Nitrite: NEGATIVE
Protein, ur: NEGATIVE mg/dL
Specific Gravity, Urine: 1.005 (ref 1.005–1.030)
pH: 6 (ref 5.0–8.0)

## 2018-01-25 LAB — WET PREP, GENITAL
Clue Cells Wet Prep HPF POC: NONE SEEN
Sperm: NONE SEEN
Trich, Wet Prep: NONE SEEN
Yeast Wet Prep HPF POC: NONE SEEN

## 2018-01-25 MED ORDER — OXYCODONE-ACETAMINOPHEN 5-325 MG PO TABS
2.0000 | ORAL_TABLET | Freq: Once | ORAL | Status: AC
  Administered 2018-01-25: 2 via ORAL
  Filled 2018-01-25: qty 2

## 2018-01-25 MED ORDER — LIDOCAINE 5 % EX PTCH: 1.0000 | MEDICATED_PATCH | CUTANEOUS | Status: DC

## 2018-01-25 MED ORDER — OXYCODONE-ACETAMINOPHEN 5-325 MG PO TABS: 1.0000 | ORAL_TABLET | ORAL | 0 refills | Status: DC | PRN

## 2018-01-25 MED ORDER — LIDOCAINE 5 % EX PTCH
1.0000 | MEDICATED_PATCH | CUTANEOUS | Status: DC
  Administered 2018-01-25: 1 via TRANSDERMAL
  Filled 2018-01-25 (×2): qty 1

## 2018-01-25 NOTE — MAU Provider Note (Signed)
History     CSN: [id number]  Arrival date and time: 01/25/18 1225   First Provider Initiated Contact with Patient 01/25/18 1249      Chief Complaint  Patient presents with  . Back Pain  . Rupture of Membranes   HPI  Jessica [name] is a 27 y.o. [name] at 4095w3d who presents to MAU with chief complaint of leaking fluid. Patient also complains of recurring back pain that has "not stopped in the past two months", abdominal cramps, and worsening migraines. States she has not eaten today but consumed "1 bottle" of water on the way to MAU. Mother-in-law and child at bedside.  Denies aggravating or alleviating factors for all problems, states she has not slept in two months.  Leaking of fluid New problem, onset today. Patient reports normal fetal movement. Denies vaginal bleeding, falls, fever. States she has not had intercourse for the past two months. Reports discomfort with urination. Denies difficulty urinating/initiating flow.   Back pain Existing problem. Reports she has "river of pain" 10/10 all over her back. States she was dissatisfied with Flexeril and Vicodin, as prescribed in the past. Requesting pain med today while she waits for lab results. Patient states her husband flushed her Flexeril and Vicodin prescriptions down home commode. Mother in law confirms he "doesn't allow her to take anything he thinks might be unsafe for the baby". Patient states she does not fear for her safety at home.  Abdominal cramping Existing problem. Reports she has experienced "nonstop" abdominal cramping that does not change from cramping character.  Migraine Existing problem. States she is experiencing worsening migraines that began earlier this pregnancy. Denies aura, visual disturbances,vomiting. Reports mild nausea as a cumulative side effect of "all the different pains".   OB History    Gravida  2   Para  1   Term  1   Preterm      AB      Living  1     SAB      TAB      Ectopic      Multiple      Live Births  1           Past Medical History:  Diagnosis Date  . Abnormal Pap smear   . ADHD (attention deficit hyperactivity disorder)   . Chronic back pain   . Depression   . Migraine headache   . UTI (lower urinary tract infection) 01/12/2013    Past Surgical History:  Procedure Laterality Date  . CESAREAN SECTION N/A 09/08/2013   Procedure: CESAREAN SECTION;  Surgeon: Tereso NewcomerUgonna A Anyanwu, MD;  Location: [facility];  Service: Obstetrics;  Laterality: N/A;  . TOOTH EXTRACTION      Family History  Problem Relation Age of Onset  . Drug abuse Mother     Social History   Tobacco Use  . Smoking status: Former Smoker    Packs/day: 1.00    Types: Cigarettes  . Smokeless tobacco: Never Used  Substance Use Topics  . Alcohol use: No  . Drug use: No    Allergies:  Allergies  Allergen Reactions  . Atarax [Hydroxyzine] Other (See Comments)    "makes me crazy"    Medications Prior to Admission  Medication Sig Dispense Refill Last Dose  . acetaminophen (TYLENOL) 500 MG tablet Take 500 mg by mouth every 6 (six) hours as needed for mild pain or headache.   Taking  . butalbital-acetaminophen-caffeine (FIORICET, ESGIC) 50-325-40 MG tablet Take 1-2 tablets by  mouth every 6 (six) hours as needed for headache. 20 tablet 0 Not taking  . calcium carbonate (TUMS - DOSED IN MG ELEMENTAL CALCIUM) 500 MG chewable tablet Chew 2 tablets by mouth 2 (two) times daily as needed for indigestion or heartburn.   Taking  . cyclobenzaprine (FLEXERIL) 10 MG tablet Take 1 tablet (10 mg total) by mouth 2 (two) times daily as needed for muscle spasms. (Patient not taking: Reported on 11/23/2017) 20 tablet 0 Not Taking  . HYDROcodone-acetaminophen (NORCO/VICODIN) 5-325 MG tablet Take 1 tablet by mouth every 6 (six) hours as needed for severe pain. Unresponsive Headache 20 tablet 0 Not taking  . Prenatal Vit-Fe Fumarate-FA (PRENATAL MULTIVITAMIN) TABS tablet Take 1 tablet by mouth  daily at 12 noon.   Taking    Review of Systems  Constitutional: Positive for fatigue. Negative for appetite change, chills and fever.  Respiratory: Negative for shortness of breath and wheezing.   Genitourinary: Positive for vaginal discharge. Negative for vaginal bleeding and vaginal pain.  Neurological: Positive for headaches. Negative for dizziness, syncope, weakness and numbness.   Physical Exam   Blood pressure 130/78, pulse 88, temperature 98.4 F (36.9 C), temperature source Oral, resp. rate 16, height 5\' 2"  (1.575 m), last menstrual period 05/08/2017.  Physical Exam  Nursing note and vitals reviewed. Constitutional: She appears well-developed and well-nourished.  Cardiovascular: Normal rate, regular rhythm, normal heart sounds and intact distal pulses.  Respiratory: Effort normal and breath sounds normal. No respiratory distress. She has no wheezes. She has no rales. She exhibits no tenderness.  GI:  Gravid  Genitourinary: Uterus normal. Vaginal discharge found.  Genitourinary Comments: Thin off white vaginal discharge visible on speculum exam  Skin: Skin is warm and dry.  Psychiatric: She has a normal mood and affect. Her behavior is normal. Judgment and thought content normal.   Reactive NST/Category 1: baseline 135, positive accelerations, no decelerations Toco: no contractions observed No contractions palpated  MAU Course  Procedures  MDM Orders Placed This Encounter  Procedures  . Wet prep, genital    Standing Status:   Standing    Number of Occurrences:   1  . Urinalysis, Routine w reflex microscopic    Standing Status:   Standing    Number of Occurrences:   1  . Discharge patient    Order Specific Question:   Discharge disposition    Answer:   01-Home or Self Care [1]    Order Specific Question:   Discharge patient date    Answer:   01/25/2018   Meds ordered this encounter  Medications  . oxyCODONE-acetaminophen (PERCOCET/ROXICET) 5-325 MG per tablet 2  tablet  . lidocaine (LIDODERM) 5 % 1 patch  . oxyCODONE-acetaminophen (PERCOCET/ROXICET) 5-325 MG tablet    Sig: Take 1 tablet by mouth every 4 (four) hours as needed for severe pain.    Dispense:  10 tablet    Refill:  0    Order Specific Question:   Supervising Provider    Answer:   Reva Bores [[id number]]  . lidocaine (LIDODERM) 5 % 1 patch    Assessment and Plan  -27 y.o. [name] at [redacted]w[redacted]d  -Muscle pain in pregnancy  -->Percocet 5-325 PO 1 tablet q 4 hours PRN, 10 pills dispensed and no refills  -->Topical Lidoderm patch PRN for back pain  -->Patient to return any other remaining analgesics to pharmacy, do not take -Normal vaginal discharge -Reactive NST -No contractions palpated or observed on toco -Reviewed general preterm labor precautions including  but not limited to falls, vaginal bleeding, leaking fluid, worsening abdominal pain,  decreased fetal movement, and headache not relieved by rest, PO hydration and prescribed medications -Next ROB appointment scheduled for next week -Elective repeat LTCS scheduled for 02/12/2018 -Discharge home in stable condition  Calvert Cantor, CNM 01/25/2018, 2:07 PM

## 2018-01-25 NOTE — MAU Note (Signed)
Pt reports back pain that starts at the bottom of her back and radiates to her upper back and shoulders and she is having pain in both her arms. Reports she has been leaking fluid since 1000.

## 2018-01-25 NOTE — MAU Note (Signed)
Pt states she back pain and lower abdominal pressure started this a.m rating it a 9. Pt starts migraines and heartburn has been getting worse and she has been leaking fluid since 10 am. Numbness in arms as well.

## 2018-01-25 NOTE — Discharge Instructions (Signed)

## 2018-01-26 LAB — GC/CHLAMYDIA PROBE AMP (~~LOC~~) NOT AT ARMC
Chlamydia: NEGATIVE
Neisseria Gonorrhea: NEGATIVE

## 2018-01-27 NOTE — Telephone Encounter (Signed)
Patient states she is having some movement but not as much.  She has been getting 10 movements in an hour but are not feeling the "big kicks" like she used to.  Informed patient that she is running out of room and the kicks would not big like at 28 or so weeks but she should still be getting movements.  Advised to sit and focus on movement, try eating or drinking something sweet and push fluids and if she still doesn't feel what she thinks is normal then she needs to be seen.  Pt states she is feeling movement but just wanted to make sure.

## 2018-01-28 ENCOUNTER — Encounter (HOSPITAL_COMMUNITY): Payer: Self-pay

## 2018-01-29 ENCOUNTER — Inpatient Hospital Stay (HOSPITAL_COMMUNITY)
Admission: AD | Admit: 2018-01-29 | Discharge: 2018-01-29 | Disposition: A | Discharge status: Home / Self Care | Payer: Medicaid Other | Source: Ambulatory Visit | Attending: Obstetrics & Gynecology | Admitting: Obstetrics & Gynecology

## 2018-01-29 ENCOUNTER — Encounter (HOSPITAL_COMMUNITY): Payer: Self-pay | Admitting: *Deleted

## 2018-01-29 DIAGNOSIS — Z3A37 37 weeks gestation of pregnancy: Secondary | ICD-10-CM | POA: Diagnosis not present

## 2018-01-29 DIAGNOSIS — Z3689 Encounter for other specified antenatal screening: Secondary | ICD-10-CM

## 2018-01-29 DIAGNOSIS — O471 False labor at or after 37 completed weeks of gestation: Secondary | ICD-10-CM | POA: Diagnosis not present

## 2018-01-29 DIAGNOSIS — O479 False labor, unspecified: Secondary | ICD-10-CM

## 2018-01-29 NOTE — MAU Note (Signed)
Pt states she has been having contractions since 0500, denies bleeding or leaking of fluid.

## 2018-01-29 NOTE — MAU Note (Signed)
Urine sent to lab 

## 2018-01-29 NOTE — Discharge Instructions (Signed)
Fetal Movement Counts °Patient Name: ________________________________________________ Patient Due Date: ____________________ °What is a fetal movement count? °A fetal movement count is the number of times that you feel your baby move during a certain amount of time. This may also be called a fetal kick count. A fetal movement count is recommended for every pregnant woman. You may be asked to start counting fetal movements as early as week 28 of your pregnancy. °Pay attention to when your baby is most active. You may notice your baby's sleep and wake cycles. You may also notice things that make your baby move more. You should do a fetal movement count: °· When your baby is normally most active. °· At the same time each day. ° °A good time to count movements is while you are resting, after having something to eat and drink. °How do I count fetal movements? °1. Find a quiet, comfortable area. Sit, or lie down on your side. °2. Write down the date, the start time and stop time, and the number of movements that you felt between those two times. Take this information with you to your health care visits. °3. For 2 hours, count kicks, flutters, swishes, rolls, and jabs. You should feel at least 10 movements during 2 hours. °4. You may stop counting after you have felt 10 movements. °5. If you do not feel 10 movements in 2 hours, have something to eat and drink. Then, keep resting and counting for 1 hour. If you feel at least 4 movements during that hour, you may stop counting. °Contact a health care provider if: °· You feel fewer than 4 movements in 2 hours. °· Your baby is not moving like he or she usually does. °Date: ____________ Start time: ____________ Stop time: ____________ Movements: ____________ °Date: ____________ Start time: ____________ Stop time: ____________ Movements: ____________ °Date: ____________ Start time: ____________ Stop time: ____________ Movements: ____________ °Date: ____________ Start time:  ____________ Stop time: ____________ Movements: ____________ °Date: ____________ Start time: ____________ Stop time: ____________ Movements: ____________ °Date: ____________ Start time: ____________ Stop time: ____________ Movements: ____________ °Date: ____________ Start time: ____________ Stop time: ____________ Movements: ____________ °Date: ____________ Start time: ____________ Stop time: ____________ Movements: ____________ °Date: ____________ Start time: ____________ Stop time: ____________ Movements: ____________ °This information is not intended to replace advice given to you by your health care provider. Make sure you discuss any questions you have with your health care provider. °Document Released: 08/27/2006 Document Revised: 03/26/2016 Document Reviewed: 09/06/2015 °Elsevier Interactive Patient Education © 2018 [employer] °Braxton Hicks Contractions °Contractions of the uterus can occur throughout pregnancy, but they are not always a sign that you are in labor. You may have practice contractions called Braxton Hicks contractions. These false labor contractions are sometimes confused with true labor. °What are Braxton Hicks contractions? °Braxton Hicks contractions are tightening movements that occur in the muscles of the uterus before labor. Unlike true labor contractions, these contractions do not result in opening (dilation) and thinning of the cervix. Toward the end of pregnancy (32-34 weeks), Braxton Hicks contractions can happen more often and may become stronger. These contractions are sometimes difficult to tell apart from true labor because they can be very uncomfortable. You should not feel embarrassed if you go to the hospital with false labor. °Sometimes, the only way to tell if you are in true labor is for your health care provider to look for changes in the cervix. The health care provider will do a physical exam and may monitor your contractions. If   you are not in true labor, the exam  should show that your cervix is not dilating and your water has not broken. If there are other health problems associated with your pregnancy, it is completely safe for you to be sent home with false labor. You may continue to have Braxton Hicks contractions until you go into true labor. How to tell the difference between true labor and false labor True labor  Contractions last 30-70 seconds.  Contractions become very regular.  Discomfort is usually felt in the top of the uterus, and it spreads to the lower abdomen and low back.  Contractions do not go away with walking.  Contractions usually become more intense and increase in frequency.  The cervix dilates and gets thinner. False labor  Contractions are usually shorter and not as strong as true labor contractions.  Contractions are usually irregular.  Contractions are often felt in the front of the lower abdomen and in the groin.  Contractions may go away when you walk around or change positions while lying down.  Contractions get weaker and are shorter-lasting as time goes on.  The cervix usually does not dilate or become thin. Follow these instructions at home:  Take over-the-counter and prescription medicines only as told by your health care provider.  Keep up with your usual exercises and follow other instructions from your health care provider.  Eat and drink lightly if you think you are going into labor.  If Braxton Hicks contractions are making you uncomfortable: ? Change your position from lying down or resting to walking, or change from walking to resting. ? Sit and rest in a tub of warm water. ? Drink enough fluid to keep your urine pale yellow. Dehydration may cause these contractions. ? Do slow and deep breathing several times an hour.  Keep all follow-up prenatal visits as told by your health care provider. This is important. Contact a health care provider if:  You have a fever.  You have continuous pain  in your abdomen. Get help right away if:  Your contractions become stronger, more regular, and closer together.  You have fluid leaking or gushing from your vagina.  You pass blood-tinged mucus (bloody show).  You have bleeding from your vagina.  You have low back pain that you never had before.  You feel your babys head pushing down and causing pelvic pressure.  Your baby is not moving inside you as much as it used to. Summary  Contractions that occur before labor are called Braxton Hicks contractions, false labor, or practice contractions.  Braxton Hicks contractions are usually shorter, weaker, farther apart, and less regular than true labor contractions. True labor contractions usually become progressively stronger and regular and they become more frequent.  Manage discomfort from [facility] contractions by changing position, resting in a warm bath, drinking plenty of water, or practicing deep breathing. This information is not intended to replace advice given to you by your health care provider. Make sure you discuss any questions you have with your health care provider. Document Released: 12/11/2016 Document Revised: 12/11/2016 Document Reviewed: 12/11/2016 Elsevier Interactive Patient Education  2018 [employer] of the uterus can occur throughout pregnancy, but they are not always a sign that you are in labor. You may have practice contractions called Braxton Hicks contractions. These false labor contractions are sometimes confused with true labor. What are Deberah Pelton contractions? Braxton Hicks contractions are tightening movements that occur in the muscles of the uterus  before labor. Unlike true labor contractions, these contractions do not result in opening (dilation) and thinning of the cervix. Toward the end of pregnancy (32-34 weeks), Braxton Hicks contractions can happen more often and may become stronger. These contractions  are sometimes difficult to tell apart from true labor because they can be very uncomfortable. You should not feel embarrassed if you go to the hospital with false labor. Sometimes, the only way to tell if you are in true labor is for your health care provider to look for changes in the cervix. The health care provider will do a physical exam and may monitor your contractions. If you are not in true labor, the exam should show that your cervix is not dilating and your water has not broken. If there are other health problems associated with your pregnancy, it is completely safe for you to be sent home with false labor. You may continue to have Braxton Hicks contractions until you go into true labor. How to tell the difference between true labor and false labor True labor  Contractions last 30-70 seconds.  Contractions become very regular.  Discomfort is usually felt in the top of the uterus, and it spreads to the lower abdomen and low back.  Contractions do not go away with walking.  Contractions usually become more intense and increase in frequency.  The cervix dilates and gets thinner. False labor  Contractions are usually shorter and not as strong as true labor contractions.  Contractions are usually irregular.  Contractions are often felt in the front of the lower abdomen and in the groin.  Contractions may go away when you walk around or change positions while lying down.  Contractions get weaker and are shorter-lasting as time goes on.  The cervix usually does not dilate or become thin. Follow these instructions at home:  Take over-the-counter and prescription medicines only as told by your health care provider.  Keep up with your usual exercises and follow other instructions from your health care provider.  Eat and drink lightly if you think you are going into labor.  If Braxton Hicks contractions are making you uncomfortable: ? Change your position from lying down or  resting to walking, or change from walking to resting. ? Sit and rest in a tub of warm water. ? Drink enough fluid to keep your urine pale yellow. Dehydration may cause these contractions. ? Do slow and deep breathing several times an hour.  Keep all follow-up prenatal visits as told by your health care provider. This is important. Contact a health care provider if:  You have a fever.  You have continuous pain in your abdomen. Get help right away if:  Your contractions become stronger, more regular, and closer together.  You have fluid leaking or gushing from your vagina.  You pass blood-tinged mucus (bloody show).  You have bleeding from your vagina.  You have low back pain that you never had before.  You feel your babys head pushing down and causing pelvic pressure.  Your baby is not moving inside you as much as it used to. Summary  Contractions that occur before labor are called Braxton Hicks contractions, false labor, or practice contractions.  Braxton Hicks contractions are usually shorter, weaker, farther apart, and less regular than true labor contractions. True labor contractions usually become progressively stronger and regular and they become more frequent.  [facility] from [facility] contractions by changing position, resting in a warm bath, drinking plenty of water, or practicing deep  breathing. This information is not intended to replace advice given to you by your health care provider. Make sure you discuss any questions you have with your health care provider. Document Released: 12/11/2016 Document Revised: 12/11/2016 Document Reviewed: 12/11/2016 Elsevier Interactive Patient Education  2018 [employer].

## 2018-01-30 ENCOUNTER — Inpatient Hospital Stay (HOSPITAL_COMMUNITY)
Admission: AD | Admit: 2018-01-30 | Discharge: 2018-01-30 | Disposition: A | Discharge status: Home / Self Care | Payer: Medicaid Other | Source: Ambulatory Visit | Attending: Obstetrics & Gynecology | Admitting: Obstetrics & Gynecology

## 2018-01-30 ENCOUNTER — Encounter (HOSPITAL_COMMUNITY): Payer: Self-pay

## 2018-01-30 ENCOUNTER — Other Ambulatory Visit: Payer: Self-pay

## 2018-01-30 DIAGNOSIS — R109 Unspecified abdominal pain: Secondary | ICD-10-CM | POA: Diagnosis not present

## 2018-01-30 DIAGNOSIS — Z888 Allergy status to other drugs, medicaments and biological substances status: Secondary | ICD-10-CM | POA: Insufficient documentation

## 2018-01-30 DIAGNOSIS — G8929 Other chronic pain: Secondary | ICD-10-CM | POA: Diagnosis not present

## 2018-01-30 DIAGNOSIS — Z79899 Other long term (current) drug therapy: Secondary | ICD-10-CM | POA: Insufficient documentation

## 2018-01-30 DIAGNOSIS — Z813 Family history of other psychoactive substance abuse and dependence: Secondary | ICD-10-CM | POA: Insufficient documentation

## 2018-01-30 DIAGNOSIS — O34219 Maternal care for unspecified type scar from previous cesarean delivery: Secondary | ICD-10-CM | POA: Insufficient documentation

## 2018-01-30 DIAGNOSIS — M545 Low back pain: Secondary | ICD-10-CM | POA: Diagnosis not present

## 2018-01-30 DIAGNOSIS — Z87891 Personal history of nicotine dependence: Secondary | ICD-10-CM | POA: Insufficient documentation

## 2018-01-30 DIAGNOSIS — W06XXXA Fall from bed, initial encounter: Secondary | ICD-10-CM

## 2018-01-30 DIAGNOSIS — Z3A37 37 weeks gestation of pregnancy: Secondary | ICD-10-CM | POA: Insufficient documentation

## 2018-01-30 DIAGNOSIS — O99353 Diseases of the nervous system complicating pregnancy, third trimester: Secondary | ICD-10-CM | POA: Diagnosis not present

## 2018-01-30 DIAGNOSIS — O9989 Other specified diseases and conditions complicating pregnancy, childbirth and the puerperium: Secondary | ICD-10-CM

## 2018-01-30 DIAGNOSIS — M549 Dorsalgia, unspecified: Secondary | ICD-10-CM | POA: Insufficient documentation

## 2018-01-30 DIAGNOSIS — Z79891 Long term (current) use of opiate analgesic: Secondary | ICD-10-CM | POA: Insufficient documentation

## 2018-01-30 DIAGNOSIS — O2343 Unspecified infection of urinary tract in pregnancy, third trimester: Secondary | ICD-10-CM | POA: Insufficient documentation

## 2018-01-30 DIAGNOSIS — O26893 Other specified pregnancy related conditions, third trimester: Secondary | ICD-10-CM | POA: Insufficient documentation

## 2018-01-30 MED ORDER — CYCLOBENZAPRINE HCL 10 MG PO TABS
10.0000 mg | ORAL_TABLET | Freq: Once | ORAL | Status: DC
  Filled 2018-01-30: qty 1

## 2018-01-30 MED ORDER — LIDOCAINE 5 % EX PTCH
1.0000 | MEDICATED_PATCH | CUTANEOUS | Status: DC
  Administered 2018-01-30: 1 via TRANSDERMAL
  Filled 2018-01-30 (×2): qty 1

## 2018-01-30 MED ORDER — CYCLOBENZAPRINE HCL 10 MG PO TABS: 10.0000 mg | ORAL_TABLET | Freq: Three times a day (TID) | ORAL | 0 refills | Status: DC | PRN

## 2018-01-30 MED ORDER — CEPHALEXIN 500 MG PO CAPS: 500.0000 mg | ORAL_CAPSULE | Freq: Four times a day (QID) | ORAL | 0 refills | Status: AC

## 2018-01-30 NOTE — Discharge Instructions (Signed)
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
## 2018-01-30 NOTE — MAU Note (Signed)
Urine in lab 

## 2018-01-30 NOTE — MAU Note (Signed)
Leda QuailRachel N Bulthuis is a 27 y.o. at 7578w1d here in MAU reporting: +fall States her feet were numb and when she went to stand up she fell forward and hit the right side of her abdomen +abdominal pain; constant +lower back pain; intermittent Sharp and dull Pain score: 8/10 Vitals:   01/30/18 1153  BP: 128/80  Pulse: 94  Resp: 20  Temp: 97.9 F (36.6 C)  SpO2: 100%     [id number] Lab orders placed from triage: ua

## 2018-02-02 ENCOUNTER — Encounter: Payer: Self-pay | Admitting: Obstetrics & Gynecology

## 2018-02-02 ENCOUNTER — Ambulatory Visit (INDEPENDENT_AMBULATORY_CARE_PROVIDER_SITE_OTHER): Payer: Medicaid Other | Admitting: Obstetrics & Gynecology

## 2018-02-02 VITALS — BP 121/66 | HR 94 | Wt 174.0 lb

## 2018-02-02 DIAGNOSIS — Z3A37 37 weeks gestation of pregnancy: Secondary | ICD-10-CM | POA: Diagnosis not present

## 2018-02-02 DIAGNOSIS — Z1389 Encounter for screening for other disorder: Secondary | ICD-10-CM

## 2018-02-02 DIAGNOSIS — Z3483 Encounter for supervision of other normal pregnancy, third trimester: Secondary | ICD-10-CM

## 2018-02-02 DIAGNOSIS — Z331 Pregnant state, incidental: Secondary | ICD-10-CM

## 2018-02-02 LAB — POCT URINALYSIS DIPSTICK
Blood, UA: NEGATIVE
Glucose, UA: NEGATIVE
Ketones, UA: NEGATIVE
Nitrite, UA: NEGATIVE
Protein, UA: POSITIVE — AB

## 2018-02-02 NOTE — Progress Notes (Signed)
[name] 5661w4d Estimated Date of Delivery: 02/19/18  Blood pressure 121/66, pulse 94, weight 174 lb (78.9 kg), last menstrual period 05/08/2017.   BP weight and urine results all reviewed and noted.  Please refer to the obstetrical flow sheet for the fundal height and fetal heart rate documentation:  Patient reports good fetal movement, denies any bleeding and no rupture of membranes symptoms or regular contractions. Patient is without complaints. All questions were answered.  Orders Placed This Encounter  Procedures  . Strep Gp B NAA  . GC/Chlamydia Probe Amp  . POCT urinalysis dipstick    Plan:  Continued routine obstetrical care, pt with muscle contraction headaches declines trigger point injections for her neck She does not like flexeril  Return in about 1 week (around 02/09/2018) for LROB.

## 2018-02-03 ENCOUNTER — Telehealth: Payer: Self-pay | Admitting: Obstetrics & Gynecology

## 2018-02-03 NOTE — Telephone Encounter (Signed)
Pt made appt yesterday to see Dr Despina Hiddeneure on Tues had induction for the 5th and pre op on the 3rd , she called today to let me know she was not keeping her appt on the 2cond and would just go to [facility] the 3[facility] for pre op. Said she did not have a ride both days.. Just wanted to give someone an FYI in case that was a issue.

## 2018-02-04 ENCOUNTER — Inpatient Hospital Stay (HOSPITAL_COMMUNITY)
Admission: AD | Admit: 2018-02-04 | Discharge: 2018-02-05 | Disposition: A | Discharge status: Home / Self Care | Payer: Medicaid Other | Source: Ambulatory Visit | Attending: Obstetrics and Gynecology | Admitting: Obstetrics and Gynecology

## 2018-02-04 ENCOUNTER — Encounter (HOSPITAL_COMMUNITY): Payer: Self-pay

## 2018-02-04 DIAGNOSIS — O471 False labor at or after 37 completed weeks of gestation: Secondary | ICD-10-CM | POA: Insufficient documentation

## 2018-02-04 DIAGNOSIS — O479 False labor, unspecified: Secondary | ICD-10-CM

## 2018-02-04 DIAGNOSIS — M545 Low back pain, unspecified: Secondary | ICD-10-CM

## 2018-02-04 DIAGNOSIS — Z3A38 38 weeks gestation of pregnancy: Secondary | ICD-10-CM | POA: Insufficient documentation

## 2018-02-04 LAB — STREP GP B NAA: Strep Gp B NAA: NEGATIVE

## 2018-02-04 LAB — GC/CHLAMYDIA PROBE AMP
Chlamydia trachomatis, NAA: NEGATIVE
Neisseria gonorrhoeae by PCR: NEGATIVE

## 2018-02-04 NOTE — MAU Note (Signed)
Pt reports contractions every 3-5 minutes x1 hour. Pt denies LOF or vaginal bleeding. Reports good fetal movement. Pt scheduled for repeat c/s next week. Cervix has not been examined

## 2018-02-05 DIAGNOSIS — O471 False labor at or after 37 completed weeks of gestation: Secondary | ICD-10-CM | POA: Diagnosis not present

## 2018-02-05 DIAGNOSIS — Z3A38 38 weeks gestation of pregnancy: Secondary | ICD-10-CM | POA: Diagnosis not present

## 2018-02-05 LAB — URINALYSIS, ROUTINE W REFLEX MICROSCOPIC
Bilirubin Urine: NEGATIVE
Glucose, UA: NEGATIVE mg/dL
Hgb urine dipstick: NEGATIVE
Ketones, ur: 20 mg/dL — AB
Nitrite: NEGATIVE
Protein, ur: NEGATIVE mg/dL
Specific Gravity, Urine: 1.024 (ref 1.005–1.030)
pH: 5 (ref 5.0–8.0)

## 2018-02-05 MED ORDER — BUTORPHANOL TARTRATE 1 MG/ML IJ SOLN: 1.0000 mg | Freq: Once | INTRAMUSCULAR | Status: DC

## 2018-02-05 MED ORDER — CYCLOBENZAPRINE HCL 10 MG PO TABS: 10.0000 mg | ORAL_TABLET | Freq: Three times a day (TID) | ORAL | 0 refills | Status: DC | PRN

## 2018-02-05 MED ORDER — LACTATED RINGERS IV BOLUS
1000.0000 mL | Freq: Once | INTRAVENOUS | Status: AC
  Administered 2018-02-05: 1000 mL via INTRAVENOUS

## 2018-02-05 MED ORDER — CYCLOBENZAPRINE HCL 10 MG PO TABS
10.0000 mg | ORAL_TABLET | Freq: Once | ORAL | Status: AC
  Administered 2018-02-05: 10 mg via ORAL
  Filled 2018-02-05: qty 1

## 2018-02-05 MED ORDER — OXYCODONE-ACETAMINOPHEN 7.5-325 MG PO TABS
1.0000 | ORAL_TABLET | Freq: Once | ORAL | Status: AC
  Administered 2018-02-05: 1 via ORAL
  Filled 2018-02-05: qty 1

## 2018-02-05 NOTE — Discharge Instructions (Signed)
Postpartum Tubal Ligation, Care After Refer to this sheet in the next few weeks. These instructions provide you with information about caring for yourself after your procedure. Your health care provider may also give you more specific instructions. Your treatment has been planned according to current medical practices, but problems sometimes occur. Call your health care provider if you have any problems or questions after your procedure. What can I expect after the procedure? After the procedure, it is common to have:  A sore throat.  Bruising or pain in your back.  Nausea or vomiting.  Dizziness.  Mild abdominal discomfort or pain, such as cramping, gas pain, or feeling bloated.  Soreness where the incision was made.  Tiredness.  Pain in your shoulders.  Follow these instructions at home: Medicines  Take over-the-counter and prescription medicines only as told by your health care provider.  Do not take aspirin because it can cause bleeding.  Do not drive or operate heavy machinery while taking prescription pain medicine. Activity  Rest for the rest of the day.  Gradually return to your normal activities over the next few days.  Do not have sex, douche, or put a tampon or anything else in your vagina for 6 weeks or as long as told by your health care provider.  Do not lift anything that is heavier than your baby for 2 weeks or as long as told by your health care provider. Incision care  Follow instructions from your health care provider about how to take care of your incision. Make sure you: ? Wash your hands with soap and water before you change your bandage (dressing). If soap and water are not available, use hand sanitizer. ? Change your dressing as told by your health care provider. ? Leave stitches (sutures) in place. They may need to stay in place for 2 weeks or longer.  Check your incision area every day for signs of infection. Check for: ? More redness, swelling,  or pain. ? More fluid or blood. ? Warmth. ? Pus or a bad smell. Other Instructions  Do not take baths, swim, or use a hot tub until your health care provider approves. You may take showers.  Keep all follow-up visits as told by your health care provider. This is important. Contact a health care provider if:  You have more redness, swelling, or pain around your incision.  Your incision feels warm to the touch.  You have pus or a bad smell coming from your incision.  The edges of your incision break open after the sutures have been removed.  Your pain does not improve after 2-3 days.  You have a rash.  You repeatedly become dizzy or lightheaded.  Your pain medicine is not helping.  You are constipated. Get help right away if:  You have a fever.  You faint.  You have pain in your abdomen that gets worse.  You have fluid or blood coming from your sutures.  You have shortness of breath or difficulty breathing.  You have chest pain or leg pain.  You have ongoing nausea or diarrhea. This information is not intended to replace advice given to you by your health care provider. Make sure you discuss any questions you have with your health care provider. Document Released: 01/27/2012 Document Revised: 12/31/2015 Document Reviewed: 07/08/2015 Elsevier Interactive Patient Education  [employer]2018 [employer] Third Trimester of Pregnancy The third trimester is from week 29 through week 42, months 7 through 9. This trimester is when your unborn  baby (fetus) is growing very fast. At the end of the ninth month, the unborn baby is about 20 inches in length. It weighs about 6-10 pounds. Follow these instructions at home:  Avoid all smoking, herbs, and alcohol. Avoid drugs not approved by your doctor.  Do not use any tobacco products, including cigarettes, chewing tobacco, and electronic cigarettes. If you need help quitting, ask your doctor. You may get counseling or other support to help  you quit.  Only take medicine as told by your doctor. Some medicines are safe and some are not during pregnancy.  Exercise only as told by your doctor. Stop exercising if you start having cramps.  Eat regular, healthy meals.  Wear a good support bra if your breasts are tender.  Do not use hot tubs, steam rooms, or saunas.  Wear your seat belt when driving.  Avoid raw meat, uncooked cheese, and liter boxes and soil used by cats.  Take your prenatal vitamins.  Take 1500-2000 milligrams of calcium daily starting at the 20th week of pregnancy until you deliver your baby.  Try taking medicine that helps you poop (stool softener) as needed, and if your doctor approves. Eat more fiber by eating fresh fruit, vegetables, and whole grains. Drink enough fluids to keep your pee (urine) clear or pale yellow.  Take warm water baths (sitz baths) to soothe pain or discomfort caused by hemorrhoids. Use hemorrhoid cream if your doctor approves.  If you have puffy, bulging veins (varicose veins), wear support hose. Raise (elevate) your feet for 15 minutes, 3-4 times a day. Limit salt in your diet.  Avoid heavy lifting, wear low heels, and sit up straight.  Rest with your legs raised if you have leg cramps or low back pain.  Visit your dentist if you have not gone during your pregnancy. Use a soft toothbrush to brush your teeth. Be gentle when you floss.  You can have sex (intercourse) unless your doctor tells you not to.  Do not travel far distances unless you must. Only do so with your doctor's approval.  Take prenatal classes.  Practice driving to the hospital.  Pack your hospital bag.  Prepare the baby's room.  Go to your doctor visits. Get help if:  You are not sure if you are in labor or if your water has broken.  You are dizzy.  You have mild cramps or pressure in your lower belly (abdominal).  You have a nagging pain in your belly area.  You continue to feel sick to your  stomach (nauseous), throw up (vomit), or have watery poop (diarrhea).  You have bad smelling fluid coming from your vagina.  You have pain with peeing (urination). Get help right away if:  You have a fever.  You are leaking fluid from your vagina.  You are spotting or bleeding from your vagina.  You have severe belly cramping or pain.  You lose or gain weight rapidly.  You have trouble catching your breath and have chest pain.  You notice sudden or extreme puffiness (swelling) of your face, hands, ankles, feet, or legs.  You have not felt the baby move in over an hour.  You have severe headaches that do not go away with medicine.  You have vision changes. This information is not intended to replace advice given to you by your health care provider. Make sure you discuss any questions you have with your health care provider. Document Released: 10/22/2009 Document Revised: 01/03/2016 Document Reviewed: 09/28/2012 Elsevier Interactive  Patient Education  2017 [employer].

## 2018-02-05 NOTE — Progress Notes (Signed)
2345-called Dr Nira Retortegele w/labor check. Will recheck in 1 hour.   0050- Rechecked pt and was unchanged. Called Dr Nira Retortegele.  Pt also requested pain meds.   Refused stadol. Said she "doesn't want us to stop her contractions"  And wants "to just get the baby out."  Spoke to Dr. Nira Retortegele who said she'd come see the patient.

## 2018-02-06 LAB — CULTURE, OB URINE

## 2018-02-08 ENCOUNTER — Other Ambulatory Visit: Payer: Self-pay

## 2018-02-08 ENCOUNTER — Encounter (HOSPITAL_COMMUNITY): Payer: Self-pay | Admitting: *Deleted

## 2018-02-08 ENCOUNTER — Inpatient Hospital Stay (HOSPITAL_COMMUNITY)
Admission: AD | Admit: 2018-02-08 | Discharge: 2018-02-08 | Disposition: A | Discharge status: Home / Self Care | Payer: Medicaid Other | Source: Ambulatory Visit | Attending: Obstetrics and Gynecology | Admitting: Obstetrics and Gynecology

## 2018-02-08 DIAGNOSIS — Z3A00 Weeks of gestation of pregnancy not specified: Secondary | ICD-10-CM | POA: Insufficient documentation

## 2018-02-08 DIAGNOSIS — O479 False labor, unspecified: Secondary | ICD-10-CM

## 2018-02-08 DIAGNOSIS — Z3490 Encounter for supervision of normal pregnancy, unspecified, unspecified trimester: Secondary | ICD-10-CM | POA: Diagnosis not present

## 2018-02-08 DIAGNOSIS — Z98891 History of uterine scar from previous surgery: Secondary | ICD-10-CM

## 2018-02-08 DIAGNOSIS — F419 Anxiety disorder, unspecified: Secondary | ICD-10-CM

## 2018-02-08 DIAGNOSIS — Z8759 Personal history of other complications of pregnancy, childbirth and the puerperium: Secondary | ICD-10-CM

## 2018-02-08 MED ORDER — ACETAMINOPHEN 500 MG PO TABS: 1000.0000 mg | ORAL_TABLET | Freq: Once | ORAL | Status: DC

## 2018-02-08 NOTE — MAU Note (Signed)
Pt presents with c/o ctxs that began @ 1200 this afternoon.  Denies ROM or VB.  Reports +FM.

## 2018-02-09 ENCOUNTER — Encounter: Payer: Medicaid Other | Admitting: Advanced Practice Midwife

## 2018-02-09 NOTE — Patient Instructions (Signed)
Jessica QuailRachel N [name]  02/09/2018   Your procedure is scheduled on:  02/12/2018  Enter through the Main Entrance of [facility] at 1200 PM.  Pick up the phone at the desk and dial [phone]  Call this number if you have problems the morning of [phone]  Remember:   Do not eat food:(After Midnight) Desps de medianoche.  Do not drink clear liquids: (After Midnight) Desps de medianoche.  Take these medicines the morning of surgery with A SIP OF WATER: none   Do not wear jewelry, make-up or nail polish.  Do not wear lotions, powders, or perfumes. Do not wear deodorant.  Do not shave 48 hours prior to surgery.  Do not bring valuables to the hospital.  [facility] is not   responsible for any belongings or valuables brought to the hospital.  Contacts, dentures or bridgework may not be worn into surgery.  Leave suitcase in the car. After surgery it may be brought to your room.  For patients admitted to the hospital, checkout time is 11:00 AM the day of              discharge.    N/A   Please read over the following fact sheets that you were given:   Surgical Site Infection Prevention

## 2018-02-10 ENCOUNTER — Other Ambulatory Visit: Payer: Self-pay

## 2018-02-10 ENCOUNTER — Encounter (HOSPITAL_COMMUNITY): Payer: Self-pay | Admitting: *Deleted

## 2018-02-10 ENCOUNTER — Inpatient Hospital Stay (HOSPITAL_COMMUNITY): Payer: Medicaid Other | Admitting: Anesthesiology

## 2018-02-10 ENCOUNTER — Encounter (HOSPITAL_COMMUNITY)
Admission: AD | Disposition: A | Discharge status: Home / Self Care | Payer: Self-pay | Source: Home / Self Care | Attending: Family Medicine

## 2018-02-10 ENCOUNTER — Encounter (HOSPITAL_COMMUNITY)
Admission: RE | Admit: 2018-02-10 | Discharge: 2018-02-10 | Disposition: A | Discharge status: Home / Self Care | Payer: Medicaid Other | Source: Ambulatory Visit | Attending: Obstetrics & Gynecology | Admitting: Obstetrics & Gynecology

## 2018-02-10 ENCOUNTER — Telehealth: Payer: Self-pay | Admitting: *Deleted

## 2018-02-10 ENCOUNTER — Inpatient Hospital Stay (HOSPITAL_COMMUNITY)
Admission: AD | Admit: 2018-02-10 | Discharge: 2018-02-13 | DRG: 788 | Disposition: A | Discharge status: Home / Self Care | Payer: Medicaid Other | Attending: Family Medicine | Admitting: Family Medicine

## 2018-02-10 DIAGNOSIS — F419 Anxiety disorder, unspecified: Secondary | ICD-10-CM | POA: Diagnosis present

## 2018-02-10 DIAGNOSIS — G8929 Other chronic pain: Secondary | ICD-10-CM | POA: Diagnosis not present

## 2018-02-10 DIAGNOSIS — O26893 Other specified pregnancy related conditions, third trimester: Secondary | ICD-10-CM | POA: Diagnosis not present

## 2018-02-10 DIAGNOSIS — O99344 Other mental disorders complicating childbirth: Secondary | ICD-10-CM | POA: Diagnosis present

## 2018-02-10 DIAGNOSIS — Z87891 Personal history of nicotine dependence: Secondary | ICD-10-CM | POA: Diagnosis not present

## 2018-02-10 DIAGNOSIS — M549 Dorsalgia, unspecified: Secondary | ICD-10-CM | POA: Diagnosis not present

## 2018-02-10 DIAGNOSIS — Z8759 Personal history of other complications of pregnancy, childbirth and the puerperium: Secondary | ICD-10-CM

## 2018-02-10 DIAGNOSIS — O34211 Maternal care for low transverse scar from previous cesarean delivery: Principal | ICD-10-CM | POA: Diagnosis present

## 2018-02-10 DIAGNOSIS — Z98891 History of uterine scar from previous surgery: Secondary | ICD-10-CM

## 2018-02-10 DIAGNOSIS — Z3A38 38 weeks gestation of pregnancy: Secondary | ICD-10-CM

## 2018-02-10 DIAGNOSIS — Z6791 Unspecified blood type, Rh negative: Secondary | ICD-10-CM

## 2018-02-10 LAB — CBC
HCT: 36.4 % (ref 36.0–46.0)
HCT: 34.8 % — ABNORMAL LOW (ref 36.0–46.0)
Hemoglobin: 12.1 g/dL (ref 12.0–15.0)
Hemoglobin: 11.5 g/dL — ABNORMAL LOW (ref 12.0–15.0)
MCH: 26.9 pg (ref 26.0–34.0)
MCH: 26.6 pg (ref 26.0–34.0)
MCHC: 33.2 g/dL (ref 30.0–36.0)
MCHC: 33 g/dL (ref 30.0–36.0)
MCV: 80.9 fL (ref 78.0–100.0)
MCV: 80.4 fL (ref 78.0–100.0)
Platelets: 241 10*3/uL (ref 150–400)
Platelets: 222 10*3/uL (ref 150–400)
RBC: 4.5 MIL/uL (ref 3.87–5.11)
RBC: 4.33 MIL/uL (ref 3.87–5.11)
RDW: 16 % — ABNORMAL HIGH (ref 11.5–15.5)
RDW: 15.9 % — ABNORMAL HIGH (ref 11.5–15.5)
WBC: 11.7 10*3/uL — ABNORMAL HIGH (ref 4.0–10.5)
WBC: 15.5 10*3/uL — ABNORMAL HIGH (ref 4.0–10.5)

## 2018-02-10 LAB — CREATININE, SERUM
Creatinine, Ser: 0.57 mg/dL (ref 0.44–1.00)
GFR calc Af Amer: >60 mL/min (ref >60)
GFR calc non Af Amer: >60 mL/min (ref >60)

## 2018-02-10 LAB — TYPE AND SCREEN
ABO/RH(D): A NEG
Antibody Screen: NEGATIVE

## 2018-02-10 SURGERY — Surgical Case
Anesthesia: Spinal

## 2018-02-10 MED ORDER — COCONUT OIL OIL: 1.0000 "application " | TOPICAL_OIL | Status: DC | PRN

## 2018-02-10 MED ORDER — ONDANSETRON HCL 4 MG/2ML IJ SOLN: 4.0000 mg | Freq: Three times a day (TID) | INTRAMUSCULAR | Status: DC | PRN

## 2018-02-10 MED ORDER — DIPHENHYDRAMINE HCL 25 MG PO CAPS: 25.0000 mg | ORAL_CAPSULE | Freq: Four times a day (QID) | ORAL | Status: DC | PRN

## 2018-02-10 MED ORDER — PRENATAL MULTIVITAMIN CH
1.0000 | ORAL_TABLET | Freq: Every day | ORAL | Status: DC
  Administered 2018-02-11 – 2018-02-13 (×3): 1 via ORAL
  Filled 2018-02-10 (×3): qty 1

## 2018-02-10 MED ORDER — NALOXONE HCL 4 MG/10ML IJ SOLN
1.0000 ug/kg/h | INTRAVENOUS | Status: DC | PRN
  Filled 2018-02-10: qty 5

## 2018-02-10 MED ORDER — NALBUPHINE HCL 10 MG/ML IJ SOLN: 5.0000 mg | INTRAMUSCULAR | Status: DC | PRN

## 2018-02-10 MED ORDER — DIBUCAINE 1 % RE OINT: 1.0000 "application " | TOPICAL_OINTMENT | RECTAL | Status: DC | PRN

## 2018-02-10 MED ORDER — ACETAMINOPHEN 325 MG PO TABS
650.0000 mg | ORAL_TABLET | ORAL | Status: DC | PRN
  Administered 2018-02-10 – 2018-02-12 (×7): 650 mg via ORAL
  Filled 2018-02-10 (×8): qty 2

## 2018-02-10 MED ORDER — PHENYLEPHRINE 8 MG IN D5W 100 ML (0.08MG/ML) PREMIX OPTIME
INJECTION | INTRAVENOUS | Status: AC
  Filled 2018-02-10: qty 100

## 2018-02-10 MED ORDER — DEXAMETHASONE SODIUM PHOSPHATE 4 MG/ML IJ SOLN
INTRAMUSCULAR | Status: AC
Start: 2018-02-10 — End: ?
  Filled 2018-02-10: qty 1

## 2018-02-10 MED ORDER — SODIUM CHLORIDE 0.9% FLUSH: 3.0000 mL | INTRAVENOUS | Status: DC | PRN

## 2018-02-10 MED ORDER — PROMETHAZINE HCL 25 MG/ML IJ SOLN: 6.2500 mg | INTRAMUSCULAR | Status: DC | PRN

## 2018-02-10 MED ORDER — OXYTOCIN 10 UNIT/ML IJ SOLN
INTRAMUSCULAR | Status: AC
  Filled 2018-02-10: qty 4

## 2018-02-10 MED ORDER — LACTATED RINGERS IV SOLN
INTRAVENOUS | Status: DC
  Administered 2018-02-10: 12:00:00 via INTRAVENOUS

## 2018-02-10 MED ORDER — MORPHINE SULFATE (PF) 0.5 MG/ML IJ SOLN
INTRAMUSCULAR | Status: DC | PRN
  Administered 2018-02-10: 200 ug via INTRATHECAL

## 2018-02-10 MED ORDER — NALBUPHINE HCL 10 MG/ML IJ SOLN
5.0000 mg | INTRAMUSCULAR | Status: DC | PRN
  Administered 2018-02-10: 5 mg via INTRAVENOUS
  Filled 2018-02-10: qty 1

## 2018-02-10 MED ORDER — FENTANYL CITRATE (PF) 100 MCG/2ML IJ SOLN
25.0000 ug | INTRAMUSCULAR | Status: DC | PRN
  Administered 2018-02-10 (×3): 50 ug via INTRAVENOUS

## 2018-02-10 MED ORDER — DEXAMETHASONE SODIUM PHOSPHATE 4 MG/ML IJ SOLN
INTRAMUSCULAR | Status: DC | PRN
  Administered 2018-02-10: 4 mg via INTRAVENOUS

## 2018-02-10 MED ORDER — NALOXONE HCL 0.4 MG/ML IJ SOLN: 0.4000 mg | INTRAMUSCULAR | Status: DC | PRN

## 2018-02-10 MED ORDER — MENTHOL 3 MG MT LOZG: 1.0000 | LOZENGE | OROMUCOSAL | Status: DC | PRN

## 2018-02-10 MED ORDER — SIMETHICONE 80 MG PO CHEW: 80.0000 mg | CHEWABLE_TABLET | ORAL | Status: DC | PRN

## 2018-02-10 MED ORDER — LACTATED RINGERS IV SOLN
INTRAVENOUS | Status: DC | PRN
  Administered 2018-02-10: 13:00:00 via INTRAVENOUS

## 2018-02-10 MED ORDER — SCOPOLAMINE 1 MG/3DAYS TD PT72
MEDICATED_PATCH | TRANSDERMAL | Status: AC
  Filled 2018-02-10: qty 1

## 2018-02-10 MED ORDER — BUPIVACAINE HCL 0.25 % IJ SOLN
INTRAMUSCULAR | Status: DC | PRN
  Administered 2018-02-10: 30 mL

## 2018-02-10 MED ORDER — LACTATED RINGERS IV BOLUS
1000.0000 mL | Freq: Once | INTRAVENOUS | Status: AC
  Administered 2018-02-10: 1000 mL via INTRAVENOUS

## 2018-02-10 MED ORDER — OXYCODONE HCL 5 MG PO TABS
10.0000 mg | ORAL_TABLET | ORAL | Status: DC | PRN
  Administered 2018-02-10 – 2018-02-13 (×14): 10 mg via ORAL
  Filled 2018-02-10 (×14): qty 2

## 2018-02-10 MED ORDER — PHENYLEPHRINE 8 MG IN D5W 100 ML (0.08MG/ML) PREMIX OPTIME: INJECTION | INTRAVENOUS | Status: DC | PRN

## 2018-02-10 MED ORDER — ONDANSETRON HCL 4 MG/2ML IJ SOLN
INTRAMUSCULAR | Status: AC
  Filled 2018-02-10: qty 2

## 2018-02-10 MED ORDER — BUPIVACAINE IN DEXTROSE 0.75-8.25 % IT SOLN
INTRATHECAL | Status: DC | PRN
  Administered 2018-02-10: 1.6 mL via INTRATHECAL

## 2018-02-10 MED ORDER — TETANUS-DIPHTH-ACELL PERTUSSIS 5-2.5-18.5 LF-MCG/0.5 IM SUSP: 0.5000 mL | Freq: Once | INTRAMUSCULAR | Status: DC

## 2018-02-10 MED ORDER — ONDANSETRON HCL 4 MG/2ML IJ SOLN
INTRAMUSCULAR | Status: DC | PRN
  Administered 2018-02-10: 4 mg via INTRAVENOUS

## 2018-02-10 MED ORDER — FENTANYL CITRATE (PF) 100 MCG/2ML IJ SOLN
INTRAMUSCULAR | Status: AC
  Administered 2018-02-10: 50 ug via INTRAVENOUS
  Filled 2018-02-10: qty 2

## 2018-02-10 MED ORDER — ZOLPIDEM TARTRATE 5 MG PO TABS
5.0000 mg | ORAL_TABLET | Freq: Every evening | ORAL | Status: DC | PRN
  Administered 2018-02-10 – 2018-02-13 (×3): 5 mg via ORAL
  Filled 2018-02-10 (×3): qty 1

## 2018-02-10 MED ORDER — NALBUPHINE HCL 10 MG/ML IJ SOLN: 5.0000 mg | Freq: Once | INTRAMUSCULAR | Status: DC | PRN

## 2018-02-10 MED ORDER — ENOXAPARIN SODIUM 40 MG/0.4ML ~~LOC~~ SOLN
40.0000 mg | SUBCUTANEOUS | Status: DC
  Administered 2018-02-11 – 2018-02-13 (×3): 40 mg via SUBCUTANEOUS
  Filled 2018-02-10 (×3): qty 0.4

## 2018-02-10 MED ORDER — OXYCODONE HCL 5 MG PO TABS
5.0000 mg | ORAL_TABLET | ORAL | Status: DC | PRN
  Administered 2018-02-10 – 2018-02-11 (×4): 5 mg via ORAL
  Filled 2018-02-10 (×4): qty 1

## 2018-02-10 MED ORDER — SENNOSIDES-DOCUSATE SODIUM 8.6-50 MG PO TABS
2.0000 | ORAL_TABLET | ORAL | Status: DC
  Administered 2018-02-10 – 2018-02-12 (×3): 2 via ORAL
  Filled 2018-02-10 (×3): qty 2

## 2018-02-10 MED ORDER — MORPHINE SULFATE (PF) 0.5 MG/ML IJ SOLN
INTRAMUSCULAR | Status: AC
  Filled 2018-02-10: qty 10

## 2018-02-10 MED ORDER — SCOPOLAMINE 1 MG/3DAYS TD PT72
MEDICATED_PATCH | TRANSDERMAL | Status: DC | PRN
  Administered 2018-02-10: 1 via TRANSDERMAL

## 2018-02-10 MED ORDER — CEFAZOLIN SODIUM-DEXTROSE 2-4 GM/100ML-% IV SOLN
2.0000 g | INTRAVENOUS | Status: AC
  Administered 2018-02-10: 2 g via INTRAVENOUS
  Filled 2018-02-10: qty 100

## 2018-02-10 MED ORDER — SIMETHICONE 80 MG PO CHEW
80.0000 mg | CHEWABLE_TABLET | Freq: Three times a day (TID) | ORAL | Status: DC
  Administered 2018-02-11 – 2018-02-13 (×5): 80 mg via ORAL
  Filled 2018-02-10 (×5): qty 1

## 2018-02-10 MED ORDER — FENTANYL CITRATE (PF) 100 MCG/2ML IJ SOLN
INTRAMUSCULAR | Status: AC
  Filled 2018-02-10: qty 2

## 2018-02-10 MED ORDER — FAMOTIDINE IN NACL 20-0.9 MG/50ML-% IV SOLN
20.0000 mg | Freq: Once | INTRAVENOUS | Status: AC
  Administered 2018-02-10: 20 mg via INTRAVENOUS
  Filled 2018-02-10: qty 50

## 2018-02-10 MED ORDER — OXYTOCIN 10 UNIT/ML IJ SOLN
INTRAVENOUS | Status: DC | PRN
  Administered 2018-02-10: 40 [IU] via INTRAVENOUS

## 2018-02-10 MED ORDER — LACTATED RINGERS IV SOLN
INTRAVENOUS | Status: DC
  Administered 2018-02-10 – 2018-02-11 (×2): via INTRAVENOUS

## 2018-02-10 MED ORDER — FENTANYL CITRATE (PF) 100 MCG/2ML IJ SOLN
50.0000 ug | Freq: Once | INTRAMUSCULAR | Status: AC
  Administered 2018-02-10: 50 ug via INTRAVENOUS
  Filled 2018-02-10: qty 2

## 2018-02-10 MED ORDER — OXYTOCIN 40 UNITS IN LACTATED RINGERS INFUSION - SIMPLE MED: 2.5000 [IU]/h | INTRAVENOUS | Status: AC

## 2018-02-10 MED ORDER — DEXAMETHASONE SODIUM PHOSPHATE 4 MG/ML IJ SOLN
INTRAMUSCULAR | Status: AC
  Filled 2018-02-10: qty 1

## 2018-02-10 MED ORDER — METOCLOPRAMIDE HCL 5 MG/ML IJ SOLN
INTRAMUSCULAR | Status: DC | PRN
  Administered 2018-02-10: 10 mg via INTRAVENOUS

## 2018-02-10 MED ORDER — MEPERIDINE HCL 25 MG/ML IJ SOLN: 6.2500 mg | INTRAMUSCULAR | Status: DC | PRN

## 2018-02-10 MED ORDER — ONDANSETRON HCL 4 MG/2ML IJ SOLN
INTRAMUSCULAR | Status: AC
Start: 2018-02-10 — End: ?
  Filled 2018-02-10: qty 2

## 2018-02-10 MED ORDER — DIPHENHYDRAMINE HCL 25 MG PO CAPS
25.0000 mg | ORAL_CAPSULE | ORAL | Status: DC | PRN
  Filled 2018-02-10 (×2): qty 1

## 2018-02-10 MED ORDER — BUPIVACAINE HCL (PF) 0.25 % IJ SOLN
INTRAMUSCULAR | Status: AC
  Filled 2018-02-10: qty 30

## 2018-02-10 MED ORDER — PHENYLEPHRINE 40 MCG/ML (10ML) SYRINGE FOR IV PUSH (FOR BLOOD PRESSURE SUPPORT)
PREFILLED_SYRINGE | INTRAVENOUS | Status: AC
  Filled 2018-02-10: qty 10

## 2018-02-10 MED ORDER — IBUPROFEN 600 MG PO TABS
600.0000 mg | ORAL_TABLET | Freq: Four times a day (QID) | ORAL | Status: DC
  Administered 2018-02-10 – 2018-02-13 (×9): 600 mg via ORAL
  Filled 2018-02-10 (×10): qty 1

## 2018-02-10 MED ORDER — FENTANYL CITRATE (PF) 100 MCG/2ML IJ SOLN
INTRAMUSCULAR | Status: DC | PRN
  Administered 2018-02-10: 90 ug via INTRAVENOUS
  Administered 2018-02-10: 10 ug via INTRATHECAL

## 2018-02-10 MED ORDER — SIMETHICONE 80 MG PO CHEW
80.0000 mg | CHEWABLE_TABLET | ORAL | Status: DC
  Administered 2018-02-10 – 2018-02-12 (×3): 80 mg via ORAL
  Filled 2018-02-10 (×3): qty 1

## 2018-02-10 MED ORDER — SCOPOLAMINE 1 MG/3DAYS TD PT72: 1.0000 | MEDICATED_PATCH | Freq: Once | TRANSDERMAL | Status: DC

## 2018-02-10 MED ORDER — PHENYLEPHRINE 8 MG IN D5W 100 ML (0.08MG/ML) PREMIX OPTIME
INJECTION | INTRAVENOUS | Status: DC | PRN
  Administered 2018-02-10: 20 ug/min via INTRAVENOUS

## 2018-02-10 MED ORDER — DIPHENHYDRAMINE HCL 50 MG/ML IJ SOLN: 12.5000 mg | INTRAMUSCULAR | Status: DC | PRN

## 2018-02-10 MED ORDER — SOD CITRATE-CITRIC ACID 500-334 MG/5ML PO SOLN
30.0000 mL | Freq: Once | ORAL | Status: AC
  Administered 2018-02-10: 30 mL via ORAL
  Filled 2018-02-10: qty 15

## 2018-02-10 MED ORDER — METOCLOPRAMIDE HCL 5 MG/ML IJ SOLN
INTRAMUSCULAR | Status: AC
  Filled 2018-02-10: qty 2

## 2018-02-10 MED ORDER — WITCH HAZEL-GLYCERIN EX PADS: 1.0000 "application " | MEDICATED_PAD | CUTANEOUS | Status: DC | PRN

## 2018-02-10 SURGICAL SUPPLY — 28 items
BENZOIN TINCTURE PRP APPL 2/3 (GAUZE/BANDAGES/DRESSINGS) ×2 IMPLANT
CHLORAPREP W/TINT 26ML (MISCELLANEOUS) ×2 IMPLANT
CLOTH BEACON ORANGE TIMEOUT ST (SAFETY) ×2 IMPLANT
DRSG OPSITE POSTOP 4X10 (GAUZE/BANDAGES/DRESSINGS) ×2 IMPLANT
ELECT REM PT RETURN 9FT ADLT (ELECTROSURGICAL) ×2
ELECTRODE REM PT RTRN 9FT ADLT (ELECTROSURGICAL) ×1 IMPLANT
GAUZE SPONGE 4X4 12PLY STRL LF (GAUZE/BANDAGES/DRESSINGS) ×2 IMPLANT
GLOVE BIOGEL PI IND STRL 7.0 (GLOVE) ×3 IMPLANT
GLOVE BIOGEL PI INDICATOR 7.0 (GLOVE) ×3
GLOVE ECLIPSE 6.5 STRL STRAW (GLOVE) ×2 IMPLANT
GOWN STRL REUS W/ TWL LRG LVL3 (GOWN DISPOSABLE) ×2 IMPLANT
GOWN STRL REUS W/TWL LRG LVL3 (GOWN DISPOSABLE) ×2
NEEDLE HYPO 22GX1.5 SAFETY (NEEDLE) ×4 IMPLANT
NS IRRIG 1000ML POUR BTL (IV SOLUTION) ×2 IMPLANT
PAD ABD 7.5X8 STRL (GAUZE/BANDAGES/DRESSINGS) ×2 IMPLANT
PAD OB MATERNITY 4.3X12.25 (PERSONAL CARE ITEMS) ×2 IMPLANT
PAD PREP 24X48 CUFFED NSTRL (MISCELLANEOUS) ×2 IMPLANT
RETRACTOR WND ALEXIS 25 LRG (MISCELLANEOUS) IMPLANT
RTRCTR WOUND ALEXIS 25CM LRG (MISCELLANEOUS)
STRIP CLOSURE SKIN 1/2X4 (GAUZE/BANDAGES/DRESSINGS) ×2 IMPLANT
SUT PLAIN 2 0 XLH (SUTURE) ×2 IMPLANT
SUT VIC AB 0 CT1 36 (SUTURE) ×4 IMPLANT
SUT VIC AB 2-0 CT1 27 (SUTURE) ×1
SUT VIC AB 2-0 CT1 TAPERPNT 27 (SUTURE) ×1 IMPLANT
SUT VIC AB 4-0 KS 27 (SUTURE) ×2 IMPLANT
SYR CONTROL 10ML LL (SYRINGE) ×4 IMPLANT
TOWEL OR 17X24 6PK STRL BLUE (TOWEL DISPOSABLE) ×6 IMPLANT
TRAY FOLEY CATH SILVER 16FR (SET/KITS/TRAYS/PACK) ×2 IMPLANT

## 2018-02-10 NOTE — Lactation Note (Signed)
This note was copied from a baby's chart. Lactation Consultation Note  Patient Name: Girl Ena DawleyRachel Chiem WUJWJ'XToday's Date: 02/10/2018 Reason for consult: Initial assessment;1st time breastfeeding;Early term 37-38.6wks  P2 mother whose infant is now 606 hours old.  Mother has a 25 year old but did not breastfeed that child.  Mother had requested LC help but baby was sleeping and swaddled when I arrived.  Family member stated that baby had recently been bottle fed.  Mother appears very anxious and cannot focus easily.  She requested to start pumping even though I explained that she did not need to pump, but would rather she put baby to breast every time she was showing cues.  Mother was easily distracted as I discussed basic breastfeeding, milk CTV, supplementation, feeding cues and STS.  She jumped in conversation from one concern to another before we had adequate time for discussion.  She asked for anxiety meds although when I asked her if she had been on any prior to pregnancy, she stated she had not.    When I asked what her feeding plan goal was mother stated she wanted to end up pumping and bottle feeding.  She will put baby to breast in the hospital.  I initiated the DEBP for her.  Discussed pump parts, assembly, disassembly and cleaning.  #24 flange was appropriate size at this time.  After explaining the pump to her, I initiated the settings and showed mother how her nipples were fitting well in the flange.  After 20 seconds, mother stated, "I don't have anything.  I am done doing this now.  I want to start slowly."  Again, I explained the importance of pumping for 15 minutes but she did not want to continue.  .  Encouraged mother to feed baby 8-12 times/24 hours or more often if baby showed cues.  Continue STS, breast massage and hand expression after feedings.  Mom made aware of O/P services, breastfeeding support groups, community resources, and our phone # for post-discharge questions. Mother will  call for assistance as needed.  RN updated.   Maternal Data Formula Feeding for Exclusion: No Has patient been taught Hand Expression?: Yes Does the patient have breastfeeding experience prior to this delivery?: No  Feeding Feeding Type: Formula Nipple Type: Slow - flow  LATCH Score                   Interventions    Lactation Tools Discussed/Used Pump Review: Setup, frequency, and cleaning;Milk Storage Initiated by:: Lilliemae Fruge Date initiated:: 02/10/18   Consult Status Consult Status: Follow-up Date: 02/11/18 Follow-up type: In-patient    Jashaun Penrose R Dorcas Melito 02/10/2018, 8:21 PM

## 2018-02-10 NOTE — Addendum Note (Signed)
Addendum  created 02/10/18 1850 by Algis GreenhouseBurger, Jian Hodgman A, CRNA   Sign clinical note

## 2018-02-10 NOTE — Op Note (Signed)
Jessica [name] PROCEDURE DATE: 02/10/2018  PREOPERATIVE DIAGNOSES: Intrauterine pregnancy at 9540w5d weeks gestation; patient declines vag del attempt, prior c-section x1  POSTOPERATIVE DIAGNOSES: The same  PROCEDURE: Repeat Low Transverse Cesarean Section  SURGEON:  Dr. Lyndel SafeKimberly Newton  ASSISTANT:  Dr. Caryl AdaJazma Annamaria Salah (OB Fellow)  ANESTHESIOLOGY TEAM: Anesthesiologist: Heather RobertsSinger, James, MD CRNA: Graciela HusbandsFussell, Wynn O, CRNA  INDICATIONS: Jessica [name] is a 27 y.o. [phone] at 3340w5d here for cesarean section secondary to the indications listed under preoperative diagnoses; please see preoperative note for further details.  The risks of cesarean section were discussed with the patient including but were not limited to: bleeding which may require transfusion or reoperation; infection which may require antibiotics; injury to bowel, bladder, ureters or other surrounding organs; injury to the fetus; need for additional procedures including hysterectomy in the event of a life-threatening hemorrhage; placental abnormalities wth subsequent pregnancies, incisional problems, thromboembolic phenomenon and other postoperative/anesthesia complications.   The patient concurred with the proposed plan, giving informed written consent for the procedure.    FINDINGS:  Viable female infant in cephalic presentation.  Apgars 9 and 9.  Clear amniotic fluid.  Intact placenta, three vessel cord.  Normal uterus, fallopian tubes and ovaries bilaterally.  ANESTHESIA: Spinal  INTRAVENOUS FLUIDS: 1000 ml   ESTIMATED BLOOD LOSS: 1679 ml URINE OUTPUT: 120 ml SPECIMENS: Placenta sent to L&D COMPLICATIONS: None immediate  PROCEDURE IN DETAIL:  The patient preoperatively received intravenous antibiotics and had sequential compression devices applied to her lower extremities.  She was then taken to the operating room where spinal anesthesia was administered and was found to be adequate. She was then placed in a dorsal supine position with  a leftward tilt, and prepped and draped in a sterile manner.  A foley catheter was placed into her bladder and attached to constant gravity.  After an adequate timeout was performed, a Pfannenstiel skin incision was made with scalpel over her preexisting scar and carried through to the underlying layer of fascia. The fascia was incised in the midline, and this incision was extended bilaterally using the Mayo scissors.  Kocher clamps were applied to the superior aspect of the fascial incision and the underlying rectus muscles were dissected off bluntly.  A similar process was carried out on the inferior aspect of the fascial incision. The rectus muscles were separated in the midline bluntly and the peritoneum was entered bluntly. Attention was turned to the lower uterine segment where a low transverse hysterotomy was made with a scalpel and extended bilaterally bluntly.  The infant was successfully delivered, the cord was clamped and cut after one minute, and the infant was handed over to the awaiting neonatology team. Uterine massage was then administered, and the placenta delivered intact with a three-vessel cord. The uterus was then cleared of clots and debris.  The hysterotomy was closed with 0 Vicryl in a running locked fashion, and an imbricating layer was also placed with 0 Vicryl. The pelvis was cleared of all clot and debris. Hemostasis was confirmed on all surfaces.  The peritoneum was closed with a 0 Vicryl running stitch. The fascia was then closed using 0 Vicryl in a running fashion.  The subcutaneous layer was irrigated, then reapproximated with 2-0 plain gut running stitches, and 30 ml of 0.25% Marcaine was injected subcutaneously around the incision.  The skin was closed with a 4-0 Vicryl subcuticular stitch. The patient tolerated the procedure well. Sponge, lap, instrument and needle counts were correct x 3.  She was taken  to the recovery room in stable condition.   Caryl Ada, DO OB  Fellow Center for [facility], [facility]

## 2018-02-10 NOTE — Telephone Encounter (Signed)
Called pt to set up post op and postpartum appointments but pt said that she would call us back later.  02-10-18  AS

## 2018-02-10 NOTE — Anesthesia Postprocedure Evaluation (Signed)
Anesthesia Post Note  Patient: Jessica QuailRachel N Miah  Procedure(s) Performed: REPEAT CESAREAN SECTION (N/A )     Patient location during evaluation: PACU Anesthesia Type: Spinal Level of consciousness: awake and alert Pain management: pain level controlled Vital Signs Assessment: post-procedure vital signs reviewed and stable Respiratory status: spontaneous breathing and respiratory function stable Cardiovascular status: blood pressure returned to baseline and stable Postop Assessment: spinal receding Anesthetic complications: no    Last Vitals:  Vitals:   02/10/18 1430 02/10/18 1445  BP: 133/83 136/79  Pulse: 88 79  Resp: 15 13  Temp:    SpO2: 99% 99%    Last Pain:  Vitals:   02/10/18 1445  TempSrc:   PainSc: 4    Pain Goal:                 Shanyla Marconi DANIEL

## 2018-02-10 NOTE — Anesthesia Postprocedure Evaluation (Signed)
Anesthesia Post Note  Patient: Jessica [name]  Procedure(s) Performed: REPEAT CESAREAN SECTION (N/A )     Patient location during evaluation: Mother Baby Anesthesia Type: Spinal Level of consciousness: awake Pain management: satisfactory to patient Vital Signs Assessment: post-procedure vital signs reviewed and stable Respiratory status: spontaneous breathing Cardiovascular status: stable Anesthetic complications: no    Last Vitals:  Vitals:   02/10/18 1728 02/10/18 1830  BP: (!) 124/92 128/76  Pulse: 65 71  Resp:    Temp: (!) 36.3 C   SpO2: 99% 99%    Last Pain:  Vitals:   02/10/18 1830  TempSrc:   PainSc: 3    Pain Goal:                 KeyCorpBURGER,[name]

## 2018-02-10 NOTE — Anesthesia Procedure Notes (Addendum)
Spinal  Patient location during procedure: OR Start time: 02/10/2018 12:24 PM End time: 02/10/2018 12:33 PM Staffing Anesthesiologist: Heather RobertsSinger, Canary Fister, MD Performed: anesthesiologist and resident/CRNA  Preanesthetic Checklist Completed: patient identified, surgical consent, pre-op evaluation, timeout performed, IV checked, risks and benefits discussed and monitors and equipment checked Spinal Block Patient position: sitting Prep: DuraPrep Patient monitoring: cardiac monitor, continuous pulse ox and blood pressure Approach: midline Location: L2-3 Injection technique: single-shot Needle Needle type: Pencan  Needle gauge: 24 G Needle length: 9 cm Additional Notes Functioning IV was confirmed and monitors were applied. Sterile prep and drape, including hand hygiene and sterile gloves were used. The patient was positioned and the spine was prepped. The skin was anesthetized with lidocaine.  Free flow of clear CSF was obtained prior to injecting local anesthetic into the CSF.  The spinal needle aspirated freely following injection.  The needle was carefully withdrawn.  The patient tolerated the procedure well.
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
## 2018-02-10 NOTE — Anesthesia Preprocedure Evaluation (Signed)
Anesthesia Evaluation  Patient identified by MRN, date of birth, ID band Patient awake    Reviewed: Allergy & Precautions, H&P , NPO status , Patient's Chart, lab work & pertinent test results  History of Anesthesia Complications Negative for: history of anesthetic complications  Airway Mallampati: II  TM Distance: >3 FB Neck ROM: full    Dental  (+) Dental Advisory Given   Pulmonary former smoker,    breath sounds clear to auscultation       Cardiovascular negative cardio ROS   Rhythm:regular Rate:Normal     Neuro/Psych PSYCHIATRIC DISORDERS Anxiety Depression negative neurological ROS     GI/Hepatic negative GI ROS, Neg liver ROS,   Endo/Other  negative endocrine ROS  Renal/GU negative Renal ROS     Musculoskeletal   Abdominal   Peds  Hematology negative hematology ROS (+)   Anesthesia Other Findings   Reproductive/Obstetrics (+) Pregnancy                             Anesthesia Physical  Anesthesia Plan  ASA: II  Anesthesia Plan: Spinal   Post-op Pain Management:    Induction:   PONV Risk Score and Plan: 3 and Ondansetron, Scopolamine patch - Pre-op and Diphenhydramine  Airway Management Planned: Natural Airway  Additional Equipment:   Intra-op Plan:   Post-operative Plan:   Informed Consent: I have reviewed the patients History and Physical, chart, labs and discussed the procedure including the risks, benefits and alternatives for the proposed anesthesia with the patient or authorized representative who has indicated his/her understanding and acceptance.   Dental advisory given  Plan Discussed with: Anesthesiologist, CRNA and Surgeon  Anesthesia Plan Comments:         Anesthesia Quick Evaluation

## 2018-02-10 NOTE — Transfer of Care (Signed)
Immediate Anesthesia Transfer of Care Note  Patient: Jessica [name]  Procedure(s) Performed: REPEAT CESAREAN SECTION (N/A )  Patient Location: PACU  Anesthesia Type:Spinal  Level of Consciousness: awake, alert  and oriented  Airway & Oxygen Therapy: Patient Spontanous Breathing  Post-op Assessment: Report given to RN and Post -op Vital signs reviewed and stable  Post vital signs: Reviewed and stable  Last Vitals:  Vitals Value Taken Time  BP    Temp    Pulse    Resp    SpO2      Last Pain:  Vitals:   02/10/18 1039  TempSrc:   PainSc: 10-Worst pain ever     Pain Score:9    Complications: No apparent anesthesia complications

## 2018-02-10 NOTE — Progress Notes (Signed)
Saw patient for request of medication for anxiety. Previously has taken clonazepam but hasn't taken for the duration of her anxiety. Has also previously been on SSRIs but "didn't do well with those." Discussed starting SSRI while in the hospital but likely not seeing much effect until a few weeks which she did not want to do. Patient is allergic to atarax. Family notes patient did not sleep at all last night and feels if she could get some sleep she will feel better. Patient agrees to trying Ambien for sleep and possible relief of anxiety. PRN ordered.  Ellwood DenseAlison Caileen Veracruz, DO PGY-2, [facility] 02/10/2018 9:32 PM

## 2018-02-10 NOTE — MAU Note (Signed)
Contractions woke her at 0400- pains coming and going- they are really bad, lost her plug, small amt of blood noted this morning. No leaking. Was 1cm when last checked.

## 2018-02-11 LAB — CBC
HCT: 29 % — ABNORMAL LOW (ref 36.0–46.0)
Hemoglobin: 9.8 g/dL — ABNORMAL LOW (ref 12.0–15.0)
MCH: 27.1 pg (ref 26.0–34.0)
MCHC: 33.8 g/dL (ref 30.0–36.0)
MCV: 80.1 fL (ref 78.0–100.0)
Platelets: 203 10*3/uL (ref 150–400)
RBC: 3.62 MIL/uL — ABNORMAL LOW (ref 3.87–5.11)
RDW: 15.9 % — ABNORMAL HIGH (ref 11.5–15.5)
WBC: 16.1 10*3/uL — ABNORMAL HIGH (ref 4.0–10.5)

## 2018-02-11 LAB — RPR: RPR Ser Ql: NONREACTIVE

## 2018-02-11 MED ORDER — LORAZEPAM 0.5 MG PO TABS
1.0000 mg | ORAL_TABLET | Freq: Once | ORAL | Status: AC
  Administered 2018-02-11: 1 mg via ORAL
  Filled 2018-02-11: qty 2

## 2018-02-11 MED ORDER — BUPROPION HCL ER (XL) 150 MG PO TB24
150.0000 mg | ORAL_TABLET | Freq: Every day | ORAL | Status: DC
  Filled 2018-02-11 (×3): qty 1

## 2018-02-11 MED ORDER — RHO D IMMUNE GLOBULIN 1500 UNIT/2ML IJ SOSY
300.0000 ug | PREFILLED_SYRINGE | Freq: Once | INTRAMUSCULAR | Status: AC
  Administered 2018-02-11: 300 ug via INTRAVENOUS
  Filled 2018-02-11: qty 2

## 2018-02-11 NOTE — Progress Notes (Signed)
Wants her nerve pill again/stated feeling anxious   Jessica [name] informed x2 but she states she does not need it      Did use hand pump and got 20 cc

## 2018-02-11 NOTE — Progress Notes (Signed)
CSW received consult for hx of Anxiety.  CSW met with MOB and FOB to offer support and complete assessment.   MOB gave permission to speak openly with FOB in the room.  MOB was sitting up in the recliner holding baby when CSW arrived.  At some point during the conversation, she became uncomfortable physically and asked FOB to take the baby, which he did willingly.  FOB appeared caring and supportive of MOB, but it was clear to CSW that they irritate each other and that there are dynamics of the relationship that may be contributing to MOB's symptoms. MOB presented with a flat affect and although willing to speak with CSW, seemed irritable, like she would rather not be bothered.  CSW was able to build some rapport by asking about her son (4 year old Dustin) and by getting her to discuss her delivery stories.  Both parents conclude that this experience with Ava was much smoother than her delivery experience with Dustin which was 3 days in labor and a c-section for failure to progress (per FOB).  They both state baby is doing very well. CSW inquired about how MOB felt emotionally throughout this pregnancy, after Dustin was born and now.  MOB states she has been struggling with anxiety for a long time and continues to struggle.  She states she took medication, after her first child was born, that she found helpful, but states FOB did not realize she was still taking the medication and "flushed it."  She states she thinks the medication was Klonopin.  Upon chart review, CSW notes an Rx for Lexapro the month following her first delivery, but does not see an Rx for Klonopin in her chart at any time.   CSW asked if MOB feels she deals with symptoms of depression as well as anxiety and she reports that she does.  CSW also asked if she has any hx of substance abuse.  She denies.  CSW asked about family hx of substance abuse.  MOB reports, "my mom does all kinds of drugs," but states this has nothing to do with MOB.  CSW  agreed, but states that when thinking about taking a benzodiazepine, such as Klonopin, Xanax, Ativan, etc, personal and family hx of substance abuse needs to be discussed because this drug class can be addictive.  FOB states strong concerns about MOB taking a drug that can be addictive.  MOB is adamant that she will not have an issue and states that FOB can "lock them up in your gun cabinet when you go to work."  CSW explained that this will defeat the purpose of having the medication if she doesn't have access to it.  CSW asked if MOB would be willing to allow CSW to provide education regarding benzodiazepines and SSRIs.  She agreed.  CSW spoke at length about this and made a recommendation to start an SSRI, like Zoloft or Lexapro, with the hopes that this medication will increase brain chemicals to reduce anxiety and depression in order to not need the fast acting anti-anxiety medication.  CSW explained that a person can be prescribed both.  She immediately stated that she has seen the side effects of antidepressants on TV and does not want to take them.  CSW explained that all medication, even Klonopin, have side effects.  CSW stated that when a provider prescribes a medication it is because they feel the benefits will outweigh the side effects.  CSW asked if MOB would be open to discussing   the side effects to an SSRI medication before deciding she is not willing to try one.  She agreed.  She concludes that she will talk with her provider about this, but still wants Klonopin or Ativan, maybe just a few pills to get her through the next few days of the postpartum period.  She states she plans to re-establish care with [facility] and can talk with them about their recommendations.  She states she was prescribed Klonopin from their office in the past.  She then asked CSW about resuming Vyvanse for ADHD and states this helped her feel more balanced and calm as well.  CSW told MOB that CSW will talk with  the OB provider about this when we converse about the anxiety/depression medication.   CSW is concerned about the addictive nature of benzodiazepines, especially as there is family hx of substance abuse, FOB seems highly concerned about addiction, and there are notes in MOB's chart about [facility] not being willing to prescribe narcotics to mother due to med-seeking behavior.  I recommend discussing side effects of SSRIs with MOB in hopes that she will be willing to try this type of medication.  CSW is concerned that since she does not have an open mind to this suggestion, she has already determined that it will not work.  CSW does not feel it is completely out of the question to provide MOB with a limited supply of anti-anxiety medication without refill to get her through the stress of being newly postpartum, as she clearly does suffer from anxiety.  CSW recommends counseling, which MOB is adamantly against because of a negative experience with a counselor after her father passed away.  CSW feels MOB could benefit from learning coping mechanisms, but she was able to identify some triggers and positive ways to cope with stress and anxiety.  She states loud noises are a trigger.  She states that she gets especially stressed and anxious when her son and his cousin get together and "scream and holler."  CSW suggests limiting their interactions around her for the next few weeks while she heals and adjusts to the newborn phase again.  Parents agree.  CSW encouraged parents to think about other triggers and how they can limit exposure to triggers.  MOB reports that she sleeps, cleans, and takes her son outside to play when feeling anxious, and finds this helpful to a certain degree.  She mostly just wants medication "to make me calm." Parents report having good supports.  FOB states he will be off work until July 16th and that his mother lives next door.  MOB's family is in [place], but she states she has a good  relationship with them and that they will come to [place] to help when needed.  Parents report having all necessary items for baby at home. attempted to have a conversation with parents about the difference between SSRIs to treat anxiety  CSW provided review of Sudden Infant Death Syndrome (SIDS) precautions.  MOB first stated that she wouldn't put baby on her back due to worry she would get a flat head and or spit/choke.  CSW informed her that she should put baby on her back 100% of the time until she can roll herself.  CSW gave explanation and parents stated understanding and agreement.   CSW spoke with S. Weinhold/CNM regarding anxiety/depression medication and recommendations and appreciates team approach.  CSW is available for support as needed.  Though CSW would like to make a   referral for counseling for MOB, this is her choice to decline and therefore, CSW identifies no further interventions needed and no barriers to discharge when medically ready.

## 2018-02-11 NOTE — Lactation Note (Signed)
This note was copied from a baby's chart. Lactation Consultation Note  Patient Name: Jessica [name] [id number]'s Date: 02/11/2018   History of anxiety and depression.   Spoke to [employer]'s RN.  Mom is choosing to formula feed by bottle.  Mom napping right now, and she asked LC not to disturb her.   LC to follow up.     Judee ClaraSmith, Makarios Madlock E 02/11/2018, 3:04 PM

## 2018-02-11 NOTE — Progress Notes (Signed)
Discontinued IV fluids and saline locked IV, encouraged patient to order her breakfast and to eat and drink plenty of fluids.  Also encouraged to ambulate in the hall and to schedule ambulation around receiving her pain medication to help with pain control while performing activity.  Pt seemed reluctant to instructions and reiterated the importance of fluid intake and walking for her benefit.

## 2018-02-11 NOTE — Progress Notes (Signed)
POSTPARTUM PROGRESS NOTE  Post Op Day 1 Subjective:  Jessica [name] is a 27 y.o. [id number] 4533w5d s/p rLTCS. Pt denies problems with po intake. She has not yet tried to ambulate, Foley still in place. Pain is moderately controlled. Lochia Small. Patient very tearful on exam, states she is very anxious and has been since being in the hospital. She received Ambien overnight without relief.   Objective: Blood pressure 119/81, pulse 71, temperature 98.4 F (36.9 C), temperature source Oral, resp. rate 18, height 5\' 2"  (1.575 m), weight 174 lb 12 oz (79.3 kg), last menstrual period 05/08/2017, SpO2 98 %, unknown if currently breastfeeding.  Physical Exam:  General: alert, cooperative and moderate distress, tearful Lochia:normal flow Chest: no respiratory distress Heart:regular rate, distal pulses intact Abdomen: soft, nontender,  Uterine Fundus: firm, appropriately tender DVT Evaluation: No calf swelling or tenderness Extremities: no edema  Recent Labs    02/10/18 1618 02/11/18 0513  HGB 11.5* 9.8*  HCT 34.8* 29.0*    Assessment/Plan:  ASSESSMENT: Jessica [name] is a 27 y.o. [id number] 4233w5d s/p rLTCS. Doing well from a postop standpoint but is greatly bothered by her anxiety. Will consult social work and plan for postpartum BH visit.  POD#1 Hb 9.8. Breastfeeding Plans for interval BTL. Plan for discharge tomorrow.   LOS: 1 day   Kingsley Spittlelison RumballDO 02/11/2018, 6:54 AM

## 2018-02-11 NOTE — Progress Notes (Signed)
Subjective: Postpartum Day #1: Cesarean Delivery Patient reports incisional pain, tolerating PO, + flatus and no problems voiding.  States her chronic back pain is worse this morning, 10/10, continues to take 5mg  doses of Roxicodone.  Objective: Vital signs in last 24 hours: Temp:  [97.4 F (36.3 C)-98.4 F (36.9 C)] 98.4 F (36.9 C) (07/04 0503) Pulse Rate:  [65-88] 71 (07/04 0503) Resp:  [13-18] 18 (07/04 0503) BP: (99-137)/(74-92) 119/81 (07/04 0503) SpO2:  [98 %-100 %] 98 % (07/04 0503)  Physical Exam:  General: alert, cooperative, appears stated age, fatigued and mild distress Lochia: appropriate Uterine Fundus: firm Incision: no significant drainage, no dehiscence, no significant erythema DVT Evaluation: No evidence of DVT seen on physical exam.  Recent Labs    02/10/18 1618 02/11/18 0513  HGB 11.5* 9.8*  HCT 34.8* 29.0*    Assessment/Plan: Status post Cesarean section. Doing well postoperatively.  Reiterated that she should take full 10mg  dose of Roxicodone Discussed addition of Wellbutrin to manage her reported history of postpartum depression. Discussed Behavioral Health referral. Asked patient to consider physical therapy referral for chronic back pain Encouraged patient to ambulate around nurses' station with infant in bassinet to relax back muscles. Offered to add order for lidocaine patch as she previously reported some comfort when I prescribed it for her in MAU. Pt declined. Continue current care.  Calvert CantorSamantha C Weinhold, CNM 02/11/2018, 9:15 AM

## 2018-02-11 NOTE — Progress Notes (Addendum)
MOB states the 10mg  of oxy does not help her pain. She requested something stronger. RN encouraged MOB to use try other nonpharmacologic pain relief methods such as rest, ice, heat. Offered prn tylenol and mothered denied. MOB admits she is doing too much, but will not stay in room. MOB up in halls walking multiple times an hour, she is very restless.  MOB asked for her Remus Lofflerambien now. Rn explained that she still has to come into the room around 2300 to wake her for baby assessment and nightly meds, and asked if waiting until 11pm would be okay, pt stated yes.   RN verified that someone is going to stay with MOB and baby all night before giving Ambien.

## 2018-02-12 ENCOUNTER — Inpatient Hospital Stay (HOSPITAL_COMMUNITY)
Admission: RE | Admit: 2018-02-12 | Payer: Medicaid Other | Source: Ambulatory Visit | Admitting: Obstetrics & Gynecology

## 2018-02-12 LAB — RH IG WORKUP (INCLUDES ABO/RH)
ABO/RH(D): A NEG
Fetal Screen: NEGATIVE
Gestational Age(Wks): 38.5
Unit division: 0

## 2018-02-12 NOTE — Procedures (Signed)
RN walked in room to administer pain medicine and saw pt crying. When asked what's wrong, pt states that she wanted something for her nerves. Pt refuses the  current medication ( Wellbutrin) that the MD has ordered for her nerves. RN spoke with MD and no new orders given. MD encourages pt to take the current medication that they have ordered since ativan was a one time only med. RN encourages pt to consider taking current medicine. RN asked if oxy 10mg  is managing her pain and she said yes. RN will continue to monitor

## 2018-02-12 NOTE — Progress Notes (Addendum)
POSTPARTUM PROGRESS NOTE  Post Partum Day 2 Subjective:  Jessica [name] is a 26 y.o. G2P2002 [redacted]w[redacted]d s/p rLTCS.  Pt denies problems with ambulating, voiding or po intake. Pain is moderately controlled. Lochia Small. Patient continues to be bothered greatly by her anxiety, received Ativan x2 yesterday. On continuing to recommend Wellbutrin per team discussion yesterday, patient became visibly anxious and tearful and refused stating "it made her crazy and she cried a lot" when she was on it previously.  Objective: Blood pressure 136/90, pulse 100, temperature 97.9 F (36.6 C), temperature source Oral, resp. rate 18, height 5' 2" (1.575 m), weight 174 lb 12 oz (79.3 kg), last menstrual period 05/08/2017, SpO2 100 %, unknown if currently breastfeeding.  Physical Exam:  General: alert, cooperative and no distress Lochia:normal flow Chest: no respiratory distress Heart:regular rate, distal pulses intact Abdomen: soft, nontender,  Uterine Fundus: firm, appropriately tender DVT Evaluation: No calf swelling or tenderness Extremities: no edema  Recent Labs    02/10/18 1618 02/11/18 0513  HGB 11.5* 9.8*  HCT 34.8* 29.0*    Assessment/Plan:  ASSESSMENT: Jessica [name] is a 26 y.o. G2P2002 [redacted]w[redacted]d s/p rLTCS. Doing well from a postop standpoint but continues to be bothered by her anxiety. She continues to refuse safer long term treatment of her anxiety, preferring benzos. With concern from providers in the past of med-seeking behavior, aim to limit addictive substances. Continue to provide education regarding this. Patient requesting to stay additional night given anxiety is particularly bothersome.  Breastfeeding going well. Plans for interval BTL with depo in the meantime.   LOS: 2 days   Alison Rumball, DO 02/12/2018, 6:22 AM    I have spoken with and examined this patient and agree with resident's note and plan of care. VSS, HRR&R, Resp unlabored, Legs neg. Discussed w/ pt that a  psychiatrist, not an OB/GYN provider, would be the appropriate provider to treat anxiety.  Pt once again offered therapy referral and declined.  Fran Cresenzo-Dishmon, CNM 02/12/2018 8:05 AM    

## 2018-02-12 NOTE — Progress Notes (Signed)
Patient was found sobbing when I entered her room to give her scheduled dose of Motrin. She states" I need something for my nerves". Patient was given 2 doses of Ativan yesterday, but none currently ordered. Dr. Shawnie PonsPratt notified of patient's status and no order for Ativan received. MD recommended Benadryl 25 mg PO, but patient refused. Patient had little sleep last night and has not rested today, in spite of family being available to care for infant and I have offered to let nursery care for infant to allow her to rest. Patient has declined offers. Infant is currently asleep in crib at patient's bedside and patient encouraged to rest. Will report status to oncoming night shift nurse at [facility]7PM.
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
## 2018-02-12 NOTE — Discharge Instructions (Signed)

## 2018-02-12 NOTE — Progress Notes (Signed)
Pt called RN to room to apologize for her behavior. Pt said that she hasn't slept much and is very cranky. RN accepted pt's apology and informed her that she understands. RN encouraged pt to call and order dinner and call for help when she needs it. Everything is copacetic.

## 2018-02-13 MED ORDER — OXYCODONE-ACETAMINOPHEN 5-325 MG PO TABS: 1.0000 | ORAL_TABLET | Freq: Four times a day (QID) | ORAL | 0 refills | Status: DC | PRN

## 2018-02-13 MED ORDER — BUPROPION HCL ER (XL) 150 MG PO TB24: 150.0000 mg | ORAL_TABLET | Freq: Every day | ORAL | 1 refills | Status: DC

## 2018-02-13 MED ORDER — IBUPROFEN 600 MG PO TABS: 600.0000 mg | ORAL_TABLET | Freq: Four times a day (QID) | ORAL | 0 refills | Status: DC

## 2018-02-13 MED ORDER — ONDANSETRON 4 MG PO TBDP
4.0000 mg | ORAL_TABLET | Freq: Once | ORAL | Status: AC
  Administered 2018-02-13: 4 mg via ORAL
  Filled 2018-02-13: qty 1

## 2018-02-13 NOTE — Progress Notes (Signed)
Patient refused to take prescribed Wellbutrin stating it made her feel funny. Called CNM on call to discuss her readiness for discharge. It was stated that she had a behavioral health evaluation at her 1 week visit and that the providers were aware that she was refusing her meds. The discharge RN discussed importance of patient having help at all times to provide rest.  The FOB and patient's great aunt were present for instructions and stated they understood. Patient also stated she was aware of her anxiety and would let her support people and providers know.

## 2018-02-13 NOTE — Lactation Note (Signed)
This note was copied from a baby's chart. Lactation Consultation Note: Mother reports that she last pumped 15 ml. Mother has been pumping and bottle feeding. Mothers breast are full. Advised mother to pump with Symphony pump piror to discharge. Mother was given a harmony hand pump with instructions on use and care. Mother is active with WIC. She reports having a appt on Monday.  Discussed WIC loaner with parents. Mother  reports that she will use the hand pump. Mother advised to do good breast massage and ice every 3-4 hours for 15 mins. To prevent engorgement. Advised mother to continue to pump every 2-3 hours for 15 mins on each breast.  Mother denies having any pumping questions. [facility] and in [facility].  Patient Name: Girl Jessica DawleyRachel Patella WUJWJ'XToday's Date: 02/13/2018 Reason for consult: Follow-up assessment   Maternal Data    Feeding Feeding Type: Breast Milk  LATCH Score                   Interventions Interventions: Hand pump  Lactation Tools Discussed/Used     Consult Status Consult Status: Complete    Michel BickersKendrick, Bushra Denman McCoy 02/13/2018, 9:23 AM

## 2018-02-18 ENCOUNTER — Telehealth: Payer: Self-pay | Admitting: Obstetrics & Gynecology

## 2018-02-18 ENCOUNTER — Encounter: Payer: Self-pay | Admitting: Obstetrics & Gynecology

## 2018-02-18 ENCOUNTER — Ambulatory Visit (INDEPENDENT_AMBULATORY_CARE_PROVIDER_SITE_OTHER): Payer: Medicaid Other | Admitting: Obstetrics & Gynecology

## 2018-02-18 ENCOUNTER — Encounter: Payer: Medicaid Other | Admitting: Obstetrics & Gynecology

## 2018-02-18 VITALS — BP 130/90 | HR 81 | Wt 158.0 lb

## 2018-02-18 DIAGNOSIS — Z98891 History of uterine scar from previous surgery: Secondary | ICD-10-CM

## 2018-02-18 MED ORDER — OXYCODONE-ACETAMINOPHEN 5-325 MG PO TABS: 1.0000 | ORAL_TABLET | ORAL | 0 refills | Status: DC | PRN

## 2018-02-18 MED ORDER — KETOROLAC TROMETHAMINE 10 MG PO TABS: 10.0000 mg | ORAL_TABLET | Freq: Three times a day (TID) | ORAL | 0 refills | Status: DC | PRN

## 2018-02-18 NOTE — Telephone Encounter (Signed)
Informed patient to take a shower and pull dressing off.  Advised to keep area dry and clean.  If steri strips are under dressing, to no pull those off but they will fall off when they are ready.  Verbalized understanding.

## 2018-02-18 NOTE — Progress Notes (Signed)
  HPI: Patient returns for routine postoperative follow-up having undergone repeat c section on . 02/10/2018 The patient's immediate postoperative recovery has been unremarkable. Since hospital discharge the patient reports no specific problems but has not had any pain meds.   Current Outpatient Medications: acetaminophen (TYLENOL) 500 MG tablet, Take 500 mg by mouth every 6 (six) hours as needed for mild pain or headache., Disp: , Rfl:  buPROPion (WELLBUTRIN XL) 150 MG 24 hr tablet, Take 1 tablet (150 mg total) by mouth daily. (Patient not taking: Reported on 02/18/2018), Disp: 90 tablet, Rfl: 1 calcium carbonate (TUMS - DOSED IN MG ELEMENTAL CALCIUM) 500 MG chewable tablet, Chew 2 tablets by mouth 2 (two) times daily as needed for indigestion or heartburn., Disp: , Rfl:  ibuprofen (ADVIL,MOTRIN) 600 MG tablet, Take 1 tablet (600 mg total) by mouth every 6 (six) hours. (Patient not taking: Reported on 02/18/2018), Disp: 30 tablet, Rfl: 0 ketorolac (TORADOL) 10 MG tablet, Take 1 tablet (10 mg total) by mouth every 8 (eight) hours as needed., Disp: 15 tablet, Rfl: 0 oxyCODONE-acetaminophen (PERCOCET/ROXICET) 5-325 MG tablet, Take 1-2 tablets by mouth every 6 (six) hours as needed for severe pain. (Patient not taking: Reported on 02/18/2018), Disp: 40 tablet, Rfl: 0 oxyCODONE-acetaminophen (PERCOCET/ROXICET) 5-325 MG tablet, Take 1 tablet by mouth every 4 (four) hours as needed for severe pain., Disp: 24 tablet, Rfl: 0 Prenatal Vit-Fe Fumarate-FA (PRENATAL MULTIVITAMIN) TABS tablet, Take 1 tablet by mouth daily at 12 noon., Disp: , Rfl:   No current facility-administered medications for this visit.     Blood pressure 130/90, pulse 81, weight 158 lb (71.7 kg), unknown if currently breastfeeding.  Physical Exam: Incision clean dry intact Abdomen soft non tender  Diagnostic Tests:   Pathology:   Impression: S/p repeat c section  Plan: Pt states the FOB flushed her pain pills down the  toilet She has had problems in the past She will get this prescription and no other one Meds ordered this encounter  Medications  . oxyCODONE-acetaminophen (PERCOCET/ROXICET) 5-325 MG tablet    Sig: Take 1 tablet by mouth every 4 (four) hours as needed for severe pain.    Dispense:  24 tablet    Refill:  0  . ketorolac (TORADOL) 10 MG tablet    Sig: Take 1 tablet (10 mg total) by mouth every 8 (eight) hours as needed.    Dispense:  15 tablet    Refill:  0     Follow up: 4  weeks  Lazaro ArmsLuther H Eure, MD

## 2018-02-22 ENCOUNTER — Encounter: Payer: Medicaid Other | Admitting: Obstetrics and Gynecology

## 2018-03-18 ENCOUNTER — Ambulatory Visit: Payer: Medicaid Other | Admitting: Advanced Practice Midwife

## 2018-03-19 ENCOUNTER — Ambulatory Visit: Payer: Medicaid Other | Admitting: Women's Health

## 2018-03-22 ENCOUNTER — Ambulatory Visit (INDEPENDENT_AMBULATORY_CARE_PROVIDER_SITE_OTHER): Payer: Medicaid Other | Admitting: Adult Health

## 2018-03-22 ENCOUNTER — Encounter: Payer: Self-pay | Admitting: Adult Health

## 2018-03-22 VITALS — BP 133/87 | HR 94 | Ht 62.0 in | Wt 146.5 lb

## 2018-03-22 DIAGNOSIS — R109 Unspecified abdominal pain: Secondary | ICD-10-CM | POA: Insufficient documentation

## 2018-03-22 DIAGNOSIS — F53 Postpartum depression: Secondary | ICD-10-CM | POA: Insufficient documentation

## 2018-03-22 DIAGNOSIS — G479 Sleep disorder, unspecified: Secondary | ICD-10-CM | POA: Insufficient documentation

## 2018-03-22 DIAGNOSIS — N921 Excessive and frequent menstruation with irregular cycle: Secondary | ICD-10-CM | POA: Diagnosis not present

## 2018-03-22 DIAGNOSIS — O99345 Other mental disorders complicating the puerperium: Secondary | ICD-10-CM

## 2018-03-22 LAB — POCT HEMOGLOBIN: Hemoglobin: 14 g/dL (ref 12.2–16.2)

## 2018-03-22 MED ORDER — ESZOPICLONE 1 MG PO TABS: 1.0000 mg | ORAL_TABLET | Freq: Every evening | ORAL | 0 refills | Status: DC | PRN

## 2018-03-22 MED ORDER — MEGESTROL ACETATE 40 MG PO TABS: ORAL_TABLET | ORAL | 1 refills | Status: DC

## 2018-03-22 MED ORDER — ESCITALOPRAM OXALATE 10 MG PO TABS: 10.0000 mg | ORAL_TABLET | Freq: Every day | ORAL | 6 refills | Status: DC

## 2018-03-22 MED ORDER — TRAZODONE HCL 50 MG PO TABS: 50.0000 mg | ORAL_TABLET | Freq: Every evening | ORAL | 0 refills | Status: DC | PRN

## 2018-03-22 NOTE — Addendum Note (Signed)
Addended by: Colen DarlingYOUNG, Shacoria Latif S on: 03/22/2018 12:51 PM   Modules accepted: Orders

## 2018-03-22 NOTE — Progress Notes (Addendum)
  Subjective:     Patient ID: Jessica [name], female   DOB: 06/29/1991, 27 y.o.   MRN: [id number]  HPI Jessica [name] is a 27 year old white female, worked in for heavy bleeding for almost 2 weeks. She had  C section 02/10/18. She says she wants tubal and papers have been signed. She says she is having headaches and not sleeping.   Review of Systems Bleeding about 2 weeks, heavy with clots and cramps, changing pads every 30 minutes or so +headaches, ?migraines  Not sleeping\ Depressed  Reviewed past medical,surgical, social and family history. Reviewed medications and allergies.     Objective:   Physical Exam BP 133/87 (BP Location: Left Arm, Patient Position: Sitting, Cuff Size: Normal)   Pulse 94   Ht 5\' 2"  (1.575 m)   Wt 146 lb 8 oz (66.5 kg)   LMP 03/11/2018 (Approximate)   Breastfeeding? No   BMI 26.80 kg/m  fingerstick HGB 14.   Skin warm and dry. Neck: mid line trachea, normal thyroid, good ROM, no lymphadenopathy noted. Lungs: clear to ausculation bilaterally. Cardiovascular: regular rate and rhythm. Pelvic: external genitalia is normal in appearance no lesions, vagina:+period blood,urethra has no lesions or masses noted, cervix:smooth and bulbous, uterus: normal size, shape and contour, mildly tender, no masses felt, adnexa: no masses or tenderness noted. Bladder is non tender and no masses felt. Will give megace to stop bleeding.  EPDS was 20, denies being suicidal, is open to meds, will rx lexapro, and will try lunesta for sleep, she says Ambien gives her bad dreams. She declines mental health referral at this time.Take time for self, and let family know know that she took [place]. (her score was 18 on 02/18/18) Will check CBC,CMP and TSH. Return in 1 week for postpartum visit. Face time 15 minutes with 50% counseling as above.  Assessment:     1. Menorrhagia with irregular cycle   2. Abdominal cramps   3. Postpartum depression   4. Sleep disturbance       Plan:     Take  Toradol or motrin, has some at time  at home Meds ordered this encounter  Medications  . megestrol (MEGACE) 40 MG tablet    Sig: Take 3 x 5 days ,then 2 x 5 days then 1 daily    Dispense:  45 tablet    Refill:  1    Order Specific Question:   Supervising Provider    Answer:   Despina HiddenEURE, LUTHER H [[id number]]  . eszopiclone (LUNESTA) 1 MG TABS tablet    Sig: Take 1 tablet (1 mg total) by mouth at bedtime as needed for sleep. Take immediately before bedtime    Dispense:  15 tablet    Refill:  0    Order Specific Question:   Supervising Provider    Answer:   Despina HiddenEURE, LUTHER H [[id number]]  . escitalopram (LEXAPRO) 10 MG tablet    Sig: Take 1 tablet (10 mg total) by mouth daily.    Dispense:  30 tablet    Refill:  6    Order Specific Question:   Supervising Provider    Answer:   Duane LopeEURE, LUTHER H [[id number]]  No sex Push fluids F/U in 1 week for postpartum with Jessica [name]  Review [facility] tubal sterilization      Addendum: medicaid will not cover lunesta, will d/c that, change to trazodone 50 mg at hs prn, #30 no refills

## 2018-03-22 NOTE — Addendum Note (Signed)
Addended by: Cyril MourningGRIFFIN, Latanga Nedrow A on: 03/22/2018 04:42 PM   Modules accepted: Orders

## 2018-03-23 ENCOUNTER — Ambulatory Visit: Payer: Medicaid Other | Admitting: Advanced Practice Midwife

## 2018-03-23 ENCOUNTER — Telehealth: Payer: Self-pay | Admitting: Adult Health

## 2018-03-23 LAB — COMPREHENSIVE METABOLIC PANEL
ALT: 11 IU/L (ref 0–32)
AST: 13 IU/L (ref 0–40)
Albumin/Globulin Ratio: 1.9 (ref 1.2–2.2)
Albumin: 4.7 g/dL (ref 3.5–5.5)
Alkaline Phosphatase: 77 IU/L (ref 39–117)
BUN/Creatinine Ratio: 11 (ref 9–23)
BUN: 10 mg/dL (ref 6–20)
Bilirubin Total: 0.3 mg/dL (ref 0.0–1.2)
CO2: 21 mmol/L (ref 20–29)
Calcium: 9.5 mg/dL (ref 8.7–10.2)
Chloride: 104 mmol/L (ref 96–106)
Creatinine, Ser: 0.93 mg/dL (ref 0.57–1.00)
GFR calc Af Amer: 99 mL/min/{1.73_m2} (ref >59)
GFR calc non Af Amer: 86 mL/min/{1.73_m2} (ref >59)
Globulin, Total: 2.5 g/dL (ref 1.5–4.5)
Glucose: 79 mg/dL (ref 65–99)
Potassium: 4.4 mmol/L (ref 3.5–5.2)
Sodium: 141 mmol/L (ref 134–144)
Total Protein: 7.2 g/dL (ref 6.0–8.5)

## 2018-03-23 LAB — CBC
Hematocrit: 42.3 % (ref 34.0–46.6)
Hemoglobin: 12.9 g/dL (ref 11.1–15.9)
MCH: 25.2 pg — ABNORMAL LOW (ref 26.6–33.0)
MCHC: 30.5 g/dL — ABNORMAL LOW (ref 31.5–35.7)
MCV: 83 fL (ref 79–97)
Platelets: 339 10*3/uL (ref 150–450)
RBC: 5.11 x10E6/uL (ref 3.77–5.28)
RDW: 17.4 % — ABNORMAL HIGH (ref 12.3–15.4)
WBC: 6.7 10*3/uL (ref 3.4–10.8)

## 2018-03-23 LAB — TSH: TSH: 0.894 u[IU]/mL (ref 0.450–4.500)

## 2018-03-23 NOTE — Telephone Encounter (Signed)
Spoke with pt letting her know labs were ok. Pt voiced understanding. JSY

## 2018-03-23 NOTE — Telephone Encounter (Signed)
Pt called in regards to lab work, states she hasn't received a call back for results

## 2018-03-24 ENCOUNTER — Telehealth: Payer: Self-pay | Admitting: *Deleted

## 2018-03-24 NOTE — Telephone Encounter (Signed)
Patient states Trazodone is for mood and not for sleep.  Informed patient Trazodone can be used for sleep as well.  Pt verbalized understanding.

## 2018-03-26 ENCOUNTER — Telehealth: Payer: Self-pay | Admitting: Adult Health

## 2018-03-26 NOTE — Telephone Encounter (Signed)
Spoke with pt. Pt was prescribed Megace Monday. She will slack up bleeding one day and then heavy bleeding the next. Pt has been taking 3 a day for 5 days. I spoke with JAG and she advised to keep taking med. It may take a little while to stop bleeding. Pt voiced understanding. JSY

## 2018-03-26 NOTE — Telephone Encounter (Signed)
Patient called, stated that the medicine that was given for bleeding is not helping, stated that she's been taking it since Monday.  She stated that she'll bleed a lot one day and then none or light on the next day.  [facility] [place] [facility] [address]  [phone]

## 2018-03-29 ENCOUNTER — Telehealth: Payer: Self-pay | Admitting: Women's Health

## 2018-03-29 ENCOUNTER — Ambulatory Visit (INDEPENDENT_AMBULATORY_CARE_PROVIDER_SITE_OTHER): Payer: Medicaid Other | Admitting: Women's Health

## 2018-03-29 ENCOUNTER — Encounter: Payer: Self-pay | Admitting: Women's Health

## 2018-03-29 DIAGNOSIS — Z3202 Encounter for pregnancy test, result negative: Secondary | ICD-10-CM | POA: Diagnosis not present

## 2018-03-29 DIAGNOSIS — F418 Other specified anxiety disorders: Secondary | ICD-10-CM

## 2018-03-29 DIAGNOSIS — O8612 Endometritis following delivery: Secondary | ICD-10-CM

## 2018-03-29 DIAGNOSIS — Z98891 History of uterine scar from previous surgery: Secondary | ICD-10-CM

## 2018-03-29 LAB — POCT URINE PREGNANCY: Preg Test, Ur: NEGATIVE

## 2018-03-29 MED ORDER — CLONAZEPAM 0.5 MG PO TABS: 0.5000 mg | ORAL_TABLET | Freq: Two times a day (BID) | ORAL | 0 refills | Status: DC | PRN

## 2018-03-29 MED ORDER — CIPROFLOXACIN HCL 500 MG PO TABS: 500.0000 mg | ORAL_TABLET | Freq: Two times a day (BID) | ORAL | 0 refills | Status: DC

## 2018-03-29 NOTE — Progress Notes (Addendum)
POSTPARTUM VISIT Patient name: Jessica [name] MRN [id number]  Date of birth: 10/29/1990 Chief Complaint:   postpartum visit (interested in birth control pills until she can get a tubal; abd pain and cramps; trouble sleeping )  History of Present Illness:   Jessica QuailRachel N Zappia is a 27 y.o. 722P2002 Caucasian female being seen today for a postpartum visit. She is 6 weeks postpartum following a repeat cesarean section, low transverse incision at 38.5 gestational weeks. Anesthesia: spinal. I have fully reviewed the prenatal and intrapartum course. Pregnancy uncomplicated. Postpartum course has been complicated by dep/anx. Bleeding no bleeding. Bowel function is normal. Bladder function is normal. Reports abdominal pain/cramping. Denies fever/chills.  Patient is not sexually active. Last sexual activity: prior to birth of baby.  Contraception method is had wants pills during pregnancy, requested BTL on admission- however papers not signed at the time. Signed BTL consent 7/11 w/ [facility]. Is not having sex until after BTL. [place] Postpartum Depression Screening: positive. Score 22. Saw JAG 8/12, rx'd lexapro 10mg  which she didn't start, states she had gone to PCP about a week earlier and was rx'd some medicine she is unsure of name, 75mg  daily. States it is not helping w/ her anxiety, anxiety is 'really bad'. No appetite, not sleeping, doesn't find joy in things she used to. Denies SI/HI/II. Declines counseling/therapy. States she was on clonazepam in past and that is the only thing that helped w/ anxiety, states 'everyone could notice a difference in me'.    Last pap May 2016.  Results were normal .  Patient's last menstrual period was 03/11/2018 (approximate).  Baby's course has been uncomplicated. Baby is feeding by bottle.  Review of Systems:   Pertinent items are noted in HPI Denies Abnormal vaginal discharge w/ itching/odor/irritation, headaches, visual changes, shortness of breath, chest pain, abdominal  pain, severe nausea/vomiting, or problems with urination or bowel movements. Pertinent History Reviewed:  Reviewed past medical,surgical, obstetrical and family history.  Reviewed problem list, medications and allergies. OB History  Gravida Para Term Preterm AB Living  2 2 2     2   SAB TAB Ectopic Multiple Live Births        0 2    # Outcome Date GA Lbr Len/2nd Weight Sex Delivery Anes PTL Lv  2 Term 02/10/18 5772w5d  7 lb 5.6 oz (3.335 kg) F CS-LTranv Spinal  LIV  1 Term 09/08/13 5834w4d  6 lb 12.1 oz (3.065 kg) M CS-LTranv EPI N LIV     Birth Comments: No problems at birth     Complications: Failure to Progress in First Stage   Physical Assessment:   Vitals:   03/29/18 1125  BP: 120/79  Pulse: 64  Weight: 146 lb (66.2 kg)  Height: 5\' 2"  (1.575 m)  Body mass index is 26.7 kg/m.       Physical Examination:   General appearance: alert, well appearing, and in no distress  Mental status: alert, oriented to person, place, and time  Skin: warm & dry   Cardiovascular: normal heart rate noted   Respiratory: normal respiratory effort, no distress   Breasts: deferred, no complaints   Abdomen: soft, non-tender, c/s incision well-healed   Pelvic: VULVA: normal appearing vulva with no masses, tenderness or lesions, UTERUS: normal size, + tenderness generalized uterus  Rectal: no hemorrhoids  Extremities: no edema       Results for orders placed or performed in visit on 03/29/18 (from the past 24 hour(s))  POCT urine pregnancy  Collection Time: 03/29/18 11:29 AM  Result Value Ref Range   Preg Test, Ur Negative Negative    Assessment & Plan:  1) Postpartum exam 2) 6 wks s/p RLTCS 3) Bottlefeeding 4) Depression screening 5) Contraception counseling, pt prefers tubal ligation, consent signed 7/11, will get scheduled for pre-op 6) Dep/anx> call me when gets home and let me know name of medicine PCP put her on for dep/anx. Rx clonazepam. Go to PCP for refills. Declines  counseling/therapy 7) PP Endometritis> rx cipro  Meds:  Meds ordered this encounter  Medications  . ciprofloxacin (CIPRO) 500 MG tablet    Sig: Take 1 tablet (500 mg total) by mouth 2 (two) times daily. X 10 days    Dispense:  20 tablet    Refill:  0    Order Specific Question:   Supervising Provider    Answer:   Despina HiddenEURE, LUTHER H [[id number]]  . clonazePAM (KLONOPIN) 0.5 MG tablet    Sig: Take 1 tablet (0.5 mg total) by mouth 2 (two) times daily as needed for anxiety.    Dispense:  30 tablet    Refill:  0    Order Specific Question:   Supervising Provider    Answer:   Lazaro ArmsEURE, LUTHER H [[id number]]    Follow-up: Return for 1wk for pre-op BTL w/ JVF, then 4wks from now for  pap & physical and f/u on meds.   Orders Placed This Encounter  Procedures  . POCT urine pregnancy    Cheral MarkerKimberly R Omere Marti CNM, [facility] 03/29/2018 12:54 PM   Pt called back, is taking Effexor ER 75mg  daily. Chart updated.  Cheral MarkerKimberly R. Shalika Arntz, CNM, [facility] 03/29/2018 1:48 PM

## 2018-03-29 NOTE — Patient Instructions (Signed)
Call us when you get home with the medicine you were given

## 2018-03-29 NOTE — Telephone Encounter (Signed)
Informed pt, per Joellyn HaffKim Booker, CNM it is ok for her to take venlafaxine and one a day. Pt verbalized understanding.

## 2018-03-29 NOTE — Telephone Encounter (Signed)
Pt called to let you know what dr gave her 2 weeks ago. Venlasaxine er 75 mg tablets 1 aday please call her and let her know if that is okay to help with both

## 2018-04-04 ENCOUNTER — Encounter (HOSPITAL_COMMUNITY): Payer: Self-pay | Admitting: Emergency Medicine

## 2018-04-04 ENCOUNTER — Other Ambulatory Visit: Payer: Self-pay

## 2018-04-04 ENCOUNTER — Emergency Department (HOSPITAL_COMMUNITY)
Admission: EM | Admit: 2018-04-04 | Discharge: 2018-04-04 | Disposition: A | Discharge status: Home / Self Care | Payer: Medicaid Other | Attending: Emergency Medicine | Admitting: Emergency Medicine

## 2018-04-04 DIAGNOSIS — F419 Anxiety disorder, unspecified: Secondary | ICD-10-CM

## 2018-04-04 DIAGNOSIS — F17210 Nicotine dependence, cigarettes, uncomplicated: Secondary | ICD-10-CM | POA: Diagnosis not present

## 2018-04-04 DIAGNOSIS — Z79899 Other long term (current) drug therapy: Secondary | ICD-10-CM | POA: Insufficient documentation

## 2018-04-04 DIAGNOSIS — L089 Local infection of the skin and subcutaneous tissue, unspecified: Secondary | ICD-10-CM

## 2018-04-04 MED ORDER — ACETAMINOPHEN 500 MG PO TABS
1000.0000 mg | ORAL_TABLET | Freq: Once | ORAL | Status: DC
Start: 2018-04-04 — End: 2018-04-05

## 2018-04-04 MED ORDER — PROMETHAZINE HCL 12.5 MG PO TABS
12.5000 mg | ORAL_TABLET | Freq: Once | ORAL | Status: AC
  Administered 2018-04-04: 12.5 mg via ORAL
  Filled 2018-04-04: qty 1

## 2018-04-04 MED ORDER — DOXYCYCLINE HYCLATE 100 MG PO TABS
100.0000 mg | ORAL_TABLET | Freq: Once | ORAL | Status: AC
  Administered 2018-04-04: 100 mg via ORAL
  Filled 2018-04-04: qty 1

## 2018-04-04 MED ORDER — HYDROCODONE-ACETAMINOPHEN 5-325 MG PO TABS: 2.0000 | ORAL_TABLET | Freq: Once | ORAL | Status: DC

## 2018-04-04 MED ORDER — LORAZEPAM 1 MG PO TABS
1.0000 mg | ORAL_TABLET | Freq: Once | ORAL | Status: AC
  Administered 2018-04-04: 1 mg via ORAL
  Filled 2018-04-04: qty 1

## 2018-04-04 MED ORDER — DOXYCYCLINE HYCLATE 100 MG PO CAPS: 100.0000 mg | ORAL_CAPSULE | Freq: Two times a day (BID) | ORAL | 0 refills | Status: DC
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
## 2018-04-04 NOTE — ED Triage Notes (Signed)
Pt reports she began to have foul smelling drainage and redness to umbilicus several days ago. Has been washing it with body wash.

## 2018-04-04 NOTE — ED Notes (Signed)
Pt and friend unhappy with wait   Asks if they can "just get an antibiotic and get out of here"  Education regarding need to be seen by a provider, cannot tell them when pt will be seen and she will be seen asap by a provider

## 2018-04-04 NOTE — Discharge Instructions (Addendum)
Your vital signs are within normal limits.  Your oxygen level is 100% on room air.  Please call your surgeon on tomorrow, and discuss the infection issue with him/her.  Cleanse the belly button with soap and water. Use doxycycline 2 times daily with food. Please discuss your anxiety with your primary physician to continue continuity of care care.  You were treated tonight with antianxiety medication.  Please use caution getting around, as you may notice drowsiness from the medication.

## 2018-04-04 NOTE — ED Notes (Signed)
Pt reports 4 day history of smelly belly button pain  She says that she cannot put her finger all the way in it because it is so deep also reports that it is giving her a migraine headache  Pt is neuro intact, non photophobic conversant and pushing on her abd stating how it hurts while pushing on her abd with a depth of several inches  Her belly button is reddened, but no foul smell is noted although it appears irritated and reddened approx 1/4 in deep when pt manipulates it

## 2018-04-04 NOTE — ED Notes (Signed)
Call to [facility] to ask if HB will come and assess pt because she threatens to leave

## 2018-04-04 NOTE — ED Notes (Signed)
Pt is threatening to leave as her baby's daddy wants her to come and get the baby

## 2018-04-05 ENCOUNTER — Ambulatory Visit (INDEPENDENT_AMBULATORY_CARE_PROVIDER_SITE_OTHER): Payer: Medicaid Other | Admitting: Obstetrics and Gynecology

## 2018-04-05 ENCOUNTER — Other Ambulatory Visit: Payer: Self-pay | Admitting: Obstetrics and Gynecology

## 2018-04-05 ENCOUNTER — Encounter: Payer: Self-pay | Admitting: Obstetrics and Gynecology

## 2018-04-05 VITALS — BP 126/80 | HR 77 | Ht 62.0 in | Wt 143.0 lb

## 2018-04-05 DIAGNOSIS — Z01810 Encounter for preprocedural cardiovascular examination: Secondary | ICD-10-CM | POA: Insufficient documentation

## 2018-04-05 DIAGNOSIS — Z01818 Encounter for other preprocedural examination: Secondary | ICD-10-CM

## 2018-04-05 MED ORDER — HYDROCODONE-ACETAMINOPHEN 5-325 MG PO TABS: 1.0000 | ORAL_TABLET | Freq: Four times a day (QID) | ORAL | 0 refills | Status: DC | PRN

## 2018-04-05 MED ORDER — PROMETHAZINE HCL 12.5 MG PO TABS: 12.5000 mg | ORAL_TABLET | Freq: Four times a day (QID) | ORAL | 0 refills | Status: DC | PRN
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
## 2018-04-06 NOTE — Patient Instructions (Signed)
Jessica [name]  04/06/2018     @PREFPERIOPPHARMACY @   Your procedure is scheduled on  04/08/2018   [facility] to [facility] at  [facility]  A.M.  Call this number if you have problems the morning of surgery:  [phone]   Remember:  Do not eat or drink after midnight.  You may drink clear liquids until  12 midnight 04/07/2018 .  Clear liquids allowed are:                    Water, Juice (non-citric and without pulp), Carbonated beverages, Clear Tea, Black Coffee only, Plain Jell-O only, Gatorade and Plain Popsicles only    Take these medicines the morning of surgery with A SIP OF WATER  None    Do not wear jewelry, make-up or nail polish.  Do not wear lotions, powders, or perfumes, or deodorant.  Do not shave 48 hours prior to surgery.  Men may shave face and neck.  Do not bring valuables to the hospital.  [facility] belongings or valuables.  Contacts, dentures or bridgework may not be worn into surgery.  Leave your suitcase in the car.  After surgery it may be brought to your room.  For patients admitted to the hospital, discharge time will be determined by your treatment team.  Patients discharged the day of surgery will not be allowed to drive home.   Name and phone number of your driver:   family Special instructions:  None  Please read over the following fact sheets that you were given. Anesthesia Post-op Instructions and Care and Recovery After Surgery       Laparoscopic Tubal Ligation Laparoscopic tubal ligation is a procedure to close the fallopian tubes. This is done so that you cannot get pregnant. When the fallopian tubes are closed, the eggs that your ovaries release cannot enter the uterus, and sperm cannot reach the released eggs. A laparoscopic tubal ligation is sometimes called "getting your tubes tied." You should not have this procedure if you want to get pregnant someday or if you are unsure about having more  children. Tell a health care provider about:  Any allergies you have.  All medicines you are taking, including vitamins, herbs, eye drops, creams, and over-the-counter medicines.  Any problems you or family members have had with anesthetic medicines.  Any blood disorders you have.  Any surgeries you have had.  Any medical conditions you have.  Whether you are pregnant or may be pregnant.  Any past pregnancies. What are the risks? Generally, this is a safe procedure. However, problems may occur, including:  Infection.  Bleeding.  Injury to surrounding organs.  Side effects from anesthetics.  Failure of the procedure.  This procedure can increase your risk of a kind of pregnancy in which a fertilized egg attaches to the outside of the uterus (ectopic pregnancy). What happens before the procedure?  Ask your health care provider about: ? Changing or stopping your regular medicines. This is especially important if you are taking diabetes medicines or blood thinners. ? Taking medicines such as aspirin and ibuprofen. These medicines can thin your blood. Do not take these medicines before your procedure if your health care provider instructs you not to.  Follow instructions from your health care provider about eating and drinking restrictions.  Plan to have someone take you home after the procedure.  If you go home right after the  procedure, plan to have someone with you for 24 hours. What happens during the procedure?  You will be given one or more of the following: ? A medicine to help you relax (sedative). ? A medicine to numb the area (local anesthetic). ? A medicine to make you fall asleep (general anesthetic). ? A medicine that is injected into an area of your body to numb everything below the injection site (regional anesthetic).  An IV tube will be inserted into one of your veins. It will be used to give you medicines and fluids during the procedure.  Your bladder  may be emptied with a small tube (catheter).  If you have been given a general anesthetic, a tube will be put down your throat to help you breathe.  Two small cuts (incisions) will be made in your lower abdomen and near your belly button.  Your abdomen will be inflated with a gas. This will let the surgeon see better and will give the surgeon room to work.  A thin, lighted tube (laparoscope) with a camera attached will be inserted into your abdomen through one of the incisions. Small instruments will be inserted through the other incision.  The fallopian tubes will be tied off, burned (cauterized), or blocked with a clip, ring, or clamp. A small portion in the center of each fallopian tube may be removed.  The gas will be released from the abdomen.  The incisions will be closed with stitches (sutures).  A bandage (dressing) will be placed over the incisions. The procedure may vary among health care providers and hospitals. What happens after the procedure?  Your blood pressure, heart rate, breathing rate, and blood oxygen level will be monitored often until the medicines you were given have worn off.  You will be given medicine to help with pain, nausea, and vomiting as needed. This information is not intended to replace advice given to you by your health care provider. Make sure you discuss any questions you have with your health care provider. Document Released: 11/03/2000 Document Revised: 01/03/2016 Document Reviewed: 07/08/2015 Elsevier Interactive Patient Education  2018 [employer].  Laparoscopic Tubal Ligation, Care After Refer to this sheet in the next few weeks. These instructions provide you with information about caring for yourself after your procedure. Your health care provider may also give you more specific instructions. Your treatment has been planned according to current medical practices, but problems sometimes occur. Call your health care provider if you have any  problems or questions after your procedure. What can I expect after the procedure? After the procedure, it is common to have:  A sore throat.  Discomfort in your shoulder.  Mild discomfort or cramping in your abdomen.  Gas pains.  Pain or soreness in the area where the surgical cut (incision) was made.  A bloated feeling.  Tiredness.  Nausea.  Vomiting.  Follow these instructions at home: Medicines  Take over-the-counter and prescription medicines only as told by your health care provider.  Do not take aspirin because it can cause bleeding.  Do not drive or operate heavy machinery while taking prescription pain medicine. Activity  Rest for the rest of the day.  Return to your normal activities as told by your health care provider. Ask your health care provider what activities are safe for you. Incision care   Follow instructions from your health care provider about how to take care of your incision. Make sure you: ? Wash your hands with soap and water before you  change your bandage (dressing). If soap and water are not available, use hand sanitizer. ? Change your dressing as told by your health care provider. ? Leave stitches (sutures) in place. They may need to stay in place for 2 weeks or longer.  Check your incision area every day for signs of infection. Check for: ? More redness, swelling, or pain. ? More fluid or blood. ? Warmth. ? Pus or a bad smell. Other Instructions  Do not take baths, swim, or use a hot tub until your health care provider approves. You may take showers.  Keep all follow-up visits as told by your health care provider. This is important.  Have someone help you with your daily household tasks for the first few days. Contact a health care provider if:  You have more redness, swelling, or pain around your incision.  Your incision feels warm to the touch.  You have pus or a bad smell coming from your incision.  The edges of your  incision break open after the sutures have been removed.  Your pain does not improve after 2-3 days.  You have a rash.  You repeatedly become dizzy or light-headed.  Your pain medicine is not helping.  You are constipated. Get help right away if:  You have a fever.  You faint.  You have increasing pain in your abdomen.  You have severe pain in one or both of your shoulders.  You have fluid or blood coming from your sutures or from your vagina.  You have shortness of breath or difficulty breathing.  You have chest pain or leg pain.  You have ongoing nausea, vomiting, or diarrhea. This information is not intended to replace advice given to you by your health care provider. Make sure you discuss any questions you have with your health care provider. Document Released: 02/14/2005 Document Revised: 12/31/2015 Document Reviewed: 07/08/2015 Elsevier Interactive Patient Education  2018 [place] Anesthesia, Adult General anesthesia is the use of medicines to make a person "go to sleep" (be unconscious) for a medical procedure. General anesthesia is often recommended when a procedure:  Is long.  Requires you to be still or in an unusual position.  Is major and can cause you to lose blood.  Is impossible to do without general anesthesia.  The medicines used for general anesthesia are called general anesthetics. In addition to making you sleep, the medicines:  Prevent pain.  Control your blood pressure.  Relax your muscles.  Tell a health care provider about:  Any allergies you have.  All medicines you are taking, including vitamins, herbs, eye drops, creams, and over-the-counter medicines.  Any problems you or family members have had with anesthetic medicines.  Types of anesthetics you have had in the past.  Any bleeding disorders you have.  Any surgeries you have had.  Any medical conditions you have.  Any history of heart or lung conditions,  such as heart failure, sleep apnea, or chronic obstructive pulmonary disease (COPD).  Whether you are pregnant or may be pregnant.  Whether you use tobacco, alcohol, marijuana, or street drugs.  Any history of Armed forces logistics/support/administrative officer.  Any history of depression or anxiety. What are the risks? Generally, this is a safe procedure. However, problems may occur, including:  Allergic reaction to anesthetics.  Lung and heart problems.  Inhaling food or liquids from your stomach into your lungs (aspiration).  Injury to nerves.  Waking up during your procedure and being unable to move (rare).  Extreme  agitation or a state of mental confusion (delirium) when you wake up from the anesthetic.  Air in the bloodstream, which can lead to stroke.  These problems are more likely to develop if you are having a major surgery or if you have an advanced medical condition. You can prevent some of these complications by answering all of your health care provider's questions thoroughly and by following all pre-procedure instructions. General anesthesia can cause side effects, including:  Nausea or vomiting  A sore throat from the breathing tube.  Feeling cold or shivery.  Feeling tired, washed out, or achy.  Sleepiness or drowsiness.  Confusion or agitation.  What happens before the procedure? Staying hydrated Follow instructions from your health care provider about hydration, which may include:  Up to 2 hours before the procedure - you may continue to drink clear liquids, such as water, clear fruit juice, black coffee, and plain tea.  Eating and drinking restrictions Follow instructions from your health care provider about eating and drinking, which may include:  8 hours before the procedure - stop eating heavy meals or foods such as meat, fried foods, or fatty foods.  6 hours before the procedure - stop eating light meals or foods, such as toast or cereal.  6 hours before the procedure -  stop drinking milk or drinks that contain milk.  2 hours before the procedure - stop drinking clear liquids.  Medicines  Ask your health care provider about: ? Changing or stopping your regular medicines. This is especially important if you are taking diabetes medicines or blood thinners. ? Taking medicines such as aspirin and ibuprofen. These medicines can thin your blood. Do not take these medicines before your procedure if your health care provider instructs you not to. ? Taking new dietary supplements or medicines. Do not take these during the week before your procedure unless your health care provider approves them.  If you are told to take a medicine or to continue taking a medicine on the day of the procedure, take the medicine with sips of water. General instructions   Ask if you will be going home the same day, the following day, or after a longer hospital stay. ? Plan to have someone take you home. ? Plan to have someone stay with you for the first 24 hours after you leave the hospital or clinic.  For 3-6 weeks before the procedure, try not to use any tobacco products, such as cigarettes, chewing tobacco, and e-cigarettes.  You may brush your teeth on the morning of the procedure, but make sure to spit out the toothpaste. What happens during the procedure?  You will be given anesthetics through a mask and through an IV tube in one of your veins.  You may receive medicine to help you relax (sedative).  As soon as you are asleep, a breathing tube may be used to help you breathe.  An anesthesia specialist will stay with you throughout the procedure. He or she will help keep you comfortable and safe by continuing to give you medicines and adjusting the amount of medicine that you get. He or she will also watch your blood pressure, pulse, and oxygen levels to make sure that the anesthetics do not cause any problems.  If a breathing tube was used to help you breathe, it will be  removed before you wake up. The procedure may vary among health care providers and hospitals. What happens after the procedure?  You will wake up, often slowly, after  the procedure is complete, usually in a recovery area.  Your blood pressure, heart rate, breathing rate, and blood oxygen level will be monitored until the medicines you were given have worn off.  You may be given medicine to help you calm down if you feel anxious or agitated.  If you will be going home the same day, your health care provider may check to make sure you can stand, drink, and urinate.  Your health care providers will treat your pain and side effects before you go home.  Do not drive for 24 hours if you received a sedative.  You may: ? Feel nauseous and vomit. ? Have a sore throat. ? Have mental slowness. ? Feel cold or shivery. ? Feel sleepy. ? Feel tired. ? Feel sore or achy, even in parts of your body where you did not have surgery. This information is not intended to replace advice given to you by your health care provider. Make sure you discuss any questions you have with your health care provider. Document Released: 11/04/2007 Document Revised: 01/08/2016 Document Reviewed: 07/12/2015 Elsevier Interactive Patient Education  2018 [place] Anesthesia, Adult, Care After These instructions provide you with information about caring for yourself after your procedure. Your health care provider may also give you more specific instructions. Your treatment has been planned according to current medical practices, but problems sometimes occur. Call your health care provider if you have any problems or questions after your procedure. What can I expect after the procedure? After the procedure, it is common to have:  Vomiting.  A sore throat.  Mental slowness.  It is common to feel:  Nauseous.  Cold or shivery.  Sleepy.  Tired.  Sore or achy, even in parts of your body where you did not  have surgery.  Follow these instructions at home: For at least 24 hours after the procedure:  Do not: ? Participate in activities where you could fall or become injured. ? Drive. ? Use heavy machinery. ? Drink alcohol. ? Take sleeping pills or medicines that cause drowsiness. ? Make important decisions or sign legal documents. ? Take care of children on your own.  Rest. Eating and drinking  If you vomit, drink water, juice, or soup when you can drink without vomiting.  Drink enough fluid to keep your urine clear or pale yellow.  Make sure you have little or no nausea before eating solid foods.  Follow the diet recommended by your health care provider. General instructions  Have a responsible adult stay with you until you are awake and alert.  Return to your normal activities as told by your health care provider. Ask your health care provider what activities are safe for you.  Take over-the-counter and prescription medicines only as told by your health care provider.  If you smoke, do not smoke without supervision.  Keep all follow-up visits as told by your health care provider. This is important. Contact a health care provider if:  You continue to have nausea or vomiting at home, and medicines are not helpful.  You cannot drink fluids or start eating again.  You cannot urinate after 8-12 hours.  You develop a skin rash.  You have fever.  You have increasing redness at the site of your procedure. Get help right away if:  You have difficulty breathing.  You have chest pain.  You have unexpected bleeding.  You feel that you are having a life-threatening or urgent problem. This information is not intended to  replace advice given to you by your health care provider. Make sure you discuss any questions you have with your health care provider. Document Released: 11/03/2000 Document Revised: 12/31/2015 Document Reviewed: 07/12/2015 Elsevier Interactive Patient  Education  [employer].

## 2018-04-07 ENCOUNTER — Other Ambulatory Visit: Payer: Self-pay

## 2018-04-07 ENCOUNTER — Encounter (HOSPITAL_COMMUNITY)
Admission: RE | Admit: 2018-04-07 | Discharge: 2018-04-07 | Disposition: A | Discharge status: Home / Self Care | Payer: Medicaid Other | Source: Ambulatory Visit | Attending: Obstetrics and Gynecology | Admitting: Obstetrics and Gynecology

## 2018-04-07 ENCOUNTER — Encounter (HOSPITAL_COMMUNITY): Payer: Self-pay

## 2018-04-07 DIAGNOSIS — Z01812 Encounter for preprocedural laboratory examination: Secondary | ICD-10-CM | POA: Insufficient documentation

## 2018-04-07 LAB — COMPREHENSIVE METABOLIC PANEL
ALT: 15 U/L (ref 0–44)
AST: 18 U/L (ref 15–41)
Albumin: 4.4 g/dL (ref 3.5–5.0)
Alkaline Phosphatase: 51 U/L (ref 38–126)
Anion gap: 6 (ref 5–15)
BUN: 14 mg/dL (ref 6–20)
CO2: 24 mmol/L (ref 22–32)
Calcium: 9.2 mg/dL (ref 8.9–10.3)
Chloride: 110 mmol/L (ref 98–111)
Creatinine, Ser: 1.05 mg/dL — ABNORMAL HIGH (ref 0.44–1.00)
GFR calc Af Amer: >60 mL/min (ref >60)
GFR calc non Af Amer: >60 mL/min (ref >60)
Glucose, Bld: 75 mg/dL (ref 70–99)
Potassium: 3.9 mmol/L (ref 3.5–5.1)
Sodium: 140 mmol/L (ref 135–145)
Total Bilirubin: 0.5 mg/dL (ref 0.3–1.2)
Total Protein: 7.5 g/dL (ref 6.5–8.1)

## 2018-04-07 LAB — CBC
HCT: 37.6 % (ref 36.0–46.0)
Hemoglobin: 12.6 g/dL (ref 12.0–15.0)
MCH: 27.6 pg (ref 26.0–34.0)
MCHC: 33.5 g/dL (ref 30.0–36.0)
MCV: 82.5 fL (ref 78.0–100.0)
Platelets: 281 10*3/uL (ref 150–400)
RBC: 4.56 MIL/uL (ref 3.87–5.11)
RDW: 15.7 % — ABNORMAL HIGH (ref 11.5–15.5)
WBC: 6.3 10*3/uL (ref 4.0–10.5)

## 2018-04-07 LAB — HCG, SERUM, QUALITATIVE: Preg, Serum: NEGATIVE

## 2018-04-08 ENCOUNTER — Telehealth: Payer: Self-pay | Admitting: Obstetrics and Gynecology

## 2018-04-08 ENCOUNTER — Ambulatory Visit (HOSPITAL_COMMUNITY): Payer: Medicaid Other | Admitting: Anesthesiology

## 2018-04-08 ENCOUNTER — Encounter (HOSPITAL_COMMUNITY): Payer: Self-pay | Admitting: *Deleted

## 2018-04-08 ENCOUNTER — Encounter (HOSPITAL_COMMUNITY)
Admission: RE | Disposition: A | Discharge status: Home / Self Care | Payer: Self-pay | Source: Ambulatory Visit | Attending: Obstetrics and Gynecology

## 2018-04-08 ENCOUNTER — Ambulatory Visit (HOSPITAL_COMMUNITY)
Admission: RE | Admit: 2018-04-08 | Discharge: 2018-04-08 | Disposition: A | Discharge status: Home / Self Care | Payer: Medicaid Other | Source: Ambulatory Visit | Attending: Obstetrics and Gynecology | Admitting: Obstetrics and Gynecology

## 2018-04-08 DIAGNOSIS — G8929 Other chronic pain: Secondary | ICD-10-CM | POA: Diagnosis not present

## 2018-04-08 DIAGNOSIS — F329 Major depressive disorder, single episode, unspecified: Secondary | ICD-10-CM | POA: Insufficient documentation

## 2018-04-08 DIAGNOSIS — N921 Excessive and frequent menstruation with irregular cycle: Secondary | ICD-10-CM | POA: Insufficient documentation

## 2018-04-08 DIAGNOSIS — Z886 Allergy status to analgesic agent status: Secondary | ICD-10-CM | POA: Insufficient documentation

## 2018-04-08 DIAGNOSIS — Z79890 Hormone replacement therapy (postmenopausal): Secondary | ICD-10-CM | POA: Insufficient documentation

## 2018-04-08 DIAGNOSIS — F17210 Nicotine dependence, cigarettes, uncomplicated: Secondary | ICD-10-CM | POA: Diagnosis not present

## 2018-04-08 DIAGNOSIS — F419 Anxiety disorder, unspecified: Secondary | ICD-10-CM | POA: Diagnosis not present

## 2018-04-08 DIAGNOSIS — Z888 Allergy status to other drugs, medicaments and biological substances status: Secondary | ICD-10-CM | POA: Diagnosis not present

## 2018-04-08 DIAGNOSIS — G43909 Migraine, unspecified, not intractable, without status migrainosus: Secondary | ICD-10-CM | POA: Insufficient documentation

## 2018-04-08 DIAGNOSIS — Z302 Encounter for sterilization: Secondary | ICD-10-CM | POA: Diagnosis not present

## 2018-04-08 DIAGNOSIS — Z79899 Other long term (current) drug therapy: Secondary | ICD-10-CM | POA: Insufficient documentation

## 2018-04-08 HISTORY — PX: TUBAL LIGATION: SHX77

## 2018-04-08 SURGERY — LIGATION, FALLOPIAN TUBE, BILATERAL
Anesthesia: General | Laterality: Bilateral

## 2018-04-08 MED ORDER — FENTANYL CITRATE (PF) 250 MCG/5ML IJ SOLN
INTRAMUSCULAR | Status: AC
  Filled 2018-04-08: qty 5

## 2018-04-08 MED ORDER — ONDANSETRON HCL 4 MG/2ML IJ SOLN
INTRAMUSCULAR | Status: DC | PRN
  Administered 2018-04-08: 4 mg via INTRAVENOUS

## 2018-04-08 MED ORDER — BUPIVACAINE HCL (PF) 0.25 % IJ SOLN
INTRAMUSCULAR | Status: DC | PRN
  Administered 2018-04-08: 8 mL

## 2018-04-08 MED ORDER — HYDROCODONE-ACETAMINOPHEN 7.5-325 MG PO TABS: 1.0000 | ORAL_TABLET | Freq: Once | ORAL | Status: DC | PRN

## 2018-04-08 MED ORDER — HYDROCODONE-ACETAMINOPHEN 5-325 MG PO TABS: 1.0000 | ORAL_TABLET | Freq: Four times a day (QID) | ORAL | 0 refills | Status: DC | PRN

## 2018-04-08 MED ORDER — GLYCOPYRROLATE 0.2 MG/ML IJ SOLN
INTRAMUSCULAR | Status: DC | PRN
  Administered 2018-04-08: .2 mg via INTRAVENOUS

## 2018-04-08 MED ORDER — ROCURONIUM 10MG/ML (10ML) SYRINGE FOR MEDFUSION PUMP - OPTIME
INTRAVENOUS | Status: DC | PRN
  Administered 2018-04-08: 35 mg via INTRAVENOUS

## 2018-04-08 MED ORDER — FENTANYL CITRATE (PF) 100 MCG/2ML IJ SOLN
25.0000 ug | INTRAMUSCULAR | Status: DC | PRN
  Administered 2018-04-08 (×2): 50 ug via INTRAVENOUS

## 2018-04-08 MED ORDER — BUPIVACAINE HCL (PF) 0.25 % IJ SOLN
INTRAMUSCULAR | Status: AC
  Filled 2018-04-08: qty 30

## 2018-04-08 MED ORDER — PROPOFOL 10 MG/ML IV BOLUS
INTRAVENOUS | Status: AC
  Filled 2018-04-08: qty 40

## 2018-04-08 MED ORDER — KETOROLAC TROMETHAMINE 10 MG PO TABS: 10.0000 mg | ORAL_TABLET | Freq: Four times a day (QID) | ORAL | 0 refills | Status: DC | PRN

## 2018-04-08 MED ORDER — MIDAZOLAM HCL 5 MG/5ML IJ SOLN
INTRAMUSCULAR | Status: DC | PRN
  Administered 2018-04-08: 2 mg via INTRAVENOUS

## 2018-04-08 MED ORDER — 0.9 % SODIUM CHLORIDE (POUR BTL) OPTIME
TOPICAL | Status: DC | PRN
  Administered 2018-04-08: 1000 mL

## 2018-04-08 MED ORDER — FENTANYL CITRATE (PF) 100 MCG/2ML IJ SOLN
INTRAMUSCULAR | Status: AC
  Filled 2018-04-08: qty 2

## 2018-04-08 MED ORDER — MIDAZOLAM HCL 2 MG/2ML IJ SOLN
INTRAMUSCULAR | Status: AC
  Filled 2018-04-08: qty 2

## 2018-04-08 MED ORDER — FENTANYL CITRATE (PF) 100 MCG/2ML IJ SOLN
INTRAMUSCULAR | Status: DC | PRN
  Administered 2018-04-08: 25 ug via INTRAVENOUS
  Administered 2018-04-08: 50 ug via INTRAVENOUS
  Administered 2018-04-08 (×5): 25 ug via INTRAVENOUS
  Administered 2018-04-08: 50 ug via INTRAVENOUS

## 2018-04-08 MED ORDER — PROPOFOL 10 MG/ML IV BOLUS
INTRAVENOUS | Status: DC | PRN
  Administered 2018-04-08: 150 mg via INTRAVENOUS

## 2018-04-08 MED ORDER — LACTATED RINGERS IV SOLN
INTRAVENOUS | Status: DC
  Administered 2018-04-08: 1000 mL via INTRAVENOUS

## 2018-04-08 MED ORDER — SUGAMMADEX SODIUM 200 MG/2ML IV SOLN
INTRAVENOUS | Status: DC | PRN
  Administered 2018-04-08: 200 mg via INTRAVENOUS

## 2018-04-08 MED ORDER — LIDOCAINE HCL (CARDIAC) PF 50 MG/5ML IV SOSY
PREFILLED_SYRINGE | INTRAVENOUS | Status: DC | PRN
  Administered 2018-04-08: 30 mg via INTRAVENOUS

## 2018-04-08 SURGICAL SUPPLY — 35 items
BANDAGE STRIP 1X3 FLEXIBLE (GAUZE/BANDAGES/DRESSINGS) ×4 IMPLANT
BLADE SURG SZ11 CARB STEEL (BLADE) ×2 IMPLANT
CLOTH BEACON ORANGE TIMEOUT ST (SAFETY) ×2 IMPLANT
COVER LIGHT HANDLE STERIS (MISCELLANEOUS) ×4 IMPLANT
DECANTER SPIKE VIAL GLASS SM (MISCELLANEOUS) ×2 IMPLANT
DURAPREP 26ML APPLICATOR (WOUND CARE) ×2 IMPLANT
ELECT REM PT RETURN 9FT ADLT (ELECTROSURGICAL) ×2
ELECTRODE REM PT RTRN 9FT ADLT (ELECTROSURGICAL) ×1 IMPLANT
GLOVE BIOGEL M 7.0 STRL (GLOVE) ×2 IMPLANT
GLOVE BIOGEL PI IND STRL 7.0 (GLOVE) ×3 IMPLANT
GLOVE BIOGEL PI IND STRL 9 (GLOVE) ×1 IMPLANT
GLOVE BIOGEL PI INDICATOR 7.0 (GLOVE) ×3
GLOVE BIOGEL PI INDICATOR 9 (GLOVE) ×1
GLOVE ECLIPSE 9.0 STRL (GLOVE) ×2 IMPLANT
GOWN SPEC L3 XXLG W/TWL (GOWN DISPOSABLE) ×2 IMPLANT
GOWN STRL REUS W/TWL LRG LVL3 (GOWN DISPOSABLE) ×2 IMPLANT
INST SET LAPROSCOPIC GYN AP (KITS) ×2 IMPLANT
KIT TURNOVER CYSTO (KITS) ×2 IMPLANT
MANIFOLD NEPTUNE II (INSTRUMENTS) ×2 IMPLANT
NEEDLE INSUFFLATION 14GA 120MM (NEEDLE) ×2 IMPLANT
NS IRRIG 1000ML POUR BTL (IV SOLUTION) ×2 IMPLANT
PACK PERI GYN (CUSTOM PROCEDURE TRAY) ×2 IMPLANT
PAD ARMBOARD 7.5X6 YLW CONV (MISCELLANEOUS) ×2 IMPLANT
RING FALLOPIAN BANDS (Ring) ×2 IMPLANT
SET BASIN LINEN APH (SET/KITS/TRAYS/PACK) ×2 IMPLANT
SOLUTION ANTI FOG 6CC (MISCELLANEOUS) ×2 IMPLANT
STRIP CLOSURE SKIN 1/4X3 (GAUZE/BANDAGES/DRESSINGS) ×2 IMPLANT
SUT VIC AB 4-0 PS2 27 (SUTURE) ×2 IMPLANT
SYR 10ML LL (SYRINGE) ×2 IMPLANT
SYR BULB IRRIGATION 50ML (SYRINGE) ×2 IMPLANT
SYR CONTROL 10ML LL (SYRINGE) ×2 IMPLANT
TROCAR KII 8X100ML NONTHREADED (TROCAR) ×2 IMPLANT
TROCAR XCEL NON-BLD 5MMX100MML (ENDOMECHANICALS) ×2 IMPLANT
TUBING INSUFFLATION (TUBING) ×2 IMPLANT
WARMER LAPAROSCOPE (MISCELLANEOUS) ×2 IMPLANT

## 2018-04-08 NOTE — Telephone Encounter (Signed)
vicodin called in by e-scribe to pharmacy of record.

## 2018-04-08 NOTE — Anesthesia Preprocedure Evaluation (Signed)
Anesthesia Evaluation  Patient identified by MRN, date of birth, ID band Patient awake    Reviewed: Allergy & Precautions, NPO status , Patient's Chart, lab work & pertinent test results  Airway Mallampati: II  TM Distance: >3 FB Neck ROM: Full    Dental no notable dental hx.    Pulmonary neg pulmonary ROS, Current Smoker,    Pulmonary exam normal breath sounds clear to auscultation       Cardiovascular Exercise Tolerance: Good negative cardio ROS Normal cardiovascular examI Rhythm:Regular Rate:Normal     Neuro/Psych  Headaches, Anxiety Depression negative neurological ROS  negative psych ROS   GI/Hepatic negative GI ROS, Neg liver ROS,   Endo/Other  negative endocrine ROS  Renal/GU negative Renal ROS  negative genitourinary   Musculoskeletal negative musculoskeletal ROS (+)   Abdominal   Peds negative pediatric ROS (+)  Hematology negative hematology ROS (+)   Anesthesia Other Findings   Reproductive/Obstetrics negative OB ROS                             Anesthesia Physical Anesthesia Plan  ASA: II  Anesthesia Plan: General   Post-op Pain Management:    Induction: Intravenous  PONV Risk Score and Plan:   Airway Management Planned: Oral ETT  Additional Equipment:   Intra-op Plan:   Post-operative Plan: Extubation in OR  Informed Consent: I have reviewed the patients History and Physical, chart, labs and discussed the procedure including the risks, benefits and alternatives for the proposed anesthesia with the patient or authorized representative who has indicated his/her understanding and acceptance.   Dental advisory given  Plan Discussed with: CRNA  Anesthesia Plan Comments:         Anesthesia Quick Evaluation

## 2018-04-08 NOTE — Anesthesia Postprocedure Evaluation (Signed)
Anesthesia Post Note  Patient: Jessica [name]  Procedure(s) Performed: LAPAROSCOPIC BILATERAL TUBAL STERILIZATION WITH FALLOPE RING APPLICATION (Bilateral )  Patient location during evaluation: PACU Anesthesia Type: General Level of consciousness: awake and alert and oriented Pain management: pain level controlled Vital Signs Assessment: post-procedure vital signs reviewed and stable Respiratory status: spontaneous breathing Cardiovascular status: blood pressure returned to baseline and stable Postop Assessment: no apparent nausea or vomiting Anesthetic complications: no Comments: Late entry     Last Vitals:  Vitals:   04/08/18 0930 04/08/18 0933  BP: 120/73 (!) 116/56  Pulse: 68 63  Resp: 17 16  Temp:  36.8 C  SpO2: 99% 100%    Last Pain:  Vitals:   04/08/18 0933  TempSrc: Oral  PainSc: 5                  Aunisty Reali

## 2018-04-08 NOTE — Discharge Instructions (Signed)
Laparoscopic Tubal Ligation, Care After Refer to this sheet in the next few weeks. These instructions provide you with information about caring for yourself after your procedure. Your health care provider may also give you more specific instructions. Your treatment has been planned according to current medical practices, but problems sometimes occur. Call your health care provider if you have any problems or questions after your procedure. What can I expect after the procedure? After the procedure, it is common to have:  A sore throat.  Discomfort in your shoulder.  Mild discomfort or cramping in your abdomen.  Gas pains.  Pain or soreness in the area where the surgical cut (incision) was made.  A bloated feeling.  Tiredness.  Nausea.  Vomiting.  Follow these instructions at home: Medicines  Take over-the-counter and prescription medicines only as told by your health care provider.  Do not take aspirin because it can cause bleeding.  Do not drive or operate heavy machinery while taking prescription pain medicine. Activity  Rest for the rest of the day.  Return to your normal activities as told by your health care provider. Ask your health care provider what activities are safe for you. Incision care   Follow instructions from your health care provider about how to take care of your incision. Make sure you: ? Wash your hands with soap and water before you change your bandage (dressing). If soap and water are not available, use hand sanitizer. ? Change your dressing as told by your health care provider. ? Leave stitches (sutures) in place. They may need to stay in place for 2 weeks or longer.  Check your incision area every day for signs of infection. Check for: ? More redness, swelling, or pain. ? More fluid or blood. ? Warmth. ? Pus or a bad smell. Other Instructions  Do not take baths, swim, or use a hot tub until your health care provider approves. You may take  showers.  Keep all follow-up visits as told by your health care provider. This is important.  Have someone help you with your daily household tasks for the first few days. Contact a health care provider if:  You have more redness, swelling, or pain around your incision.  Your incision feels warm to the touch.  You have pus or a bad smell coming from your incision.  The edges of your incision break open after the sutures have been removed.  Your pain does not improve after 2-3 days.  You have a rash.  You repeatedly become dizzy or light-headed.  Your pain medicine is not helping.  You are constipated. Get help right away if:  You have a fever.  You faint.  You have increasing pain in your abdomen.  You have severe pain in one or both of your shoulders.  You have fluid or blood coming from your sutures or from your vagina.  You have shortness of breath or difficulty breathing.  You have chest pain or leg pain.  You have ongoing nausea, vomiting, or diarrhea. This information is not intended to replace advice given to you by your health care provider. Make sure you discuss any questions you have with your health care provider. Document Released: 02/14/2005 Document Revised: 12/31/2015 Document Reviewed: 07/08/2015 Elsevier Interactive Patient Education  2018 [employer]  PATIENT INSTRUCTIONS POST-ANESTHESIA  IMMEDIATELY FOLLOWING SURGERY:  Do not drive or operate machinery for the first twenty four hours after surgery.  Do not make any important decisions for twenty four hours after  surgery or while taking narcotic pain medications or sedatives.  If you develop intractable nausea and vomiting or a severe headache please notify your doctor immediately.  FOLLOW-UP:  Please make an appointment with your surgeon as instructed. You do not need to follow up with anesthesia unless specifically instructed to do so.  WOUND CARE INSTRUCTIONS (if applicable):  Keep a dry  clean dressing on the anesthesia/puncture wound site if there is drainage.  Once the wound has quit draining you may leave it open to air.  Generally you should leave the bandage intact for twenty four hours unless there is drainage.  If the epidural site drains for more than 36-48 hours please call the anesthesia department.  QUESTIONS?:  Please feel free to call your physician or the hospital operator if you have any questions, and they will be happy to assist you.

## 2018-04-08 NOTE — Op Note (Signed)
04/08/2018  8:48 AM  PATIENT:  Leda Quailachel N Vargo  27 y.o. female  PRE-OPERATIVE DIAGNOSIS:  requested elective permanent sterilization  POST-OPERATIVE DIAGNOSIS:  requested elective permanent sterilization  PROCEDURE:  Procedure(s): LAPAROSCOPIC BILATERAL TUBAL STERILIZATION WITH FALLOPE RING APPLICATION (Bilateral)  SURGEON:  Surgeon(s) and Role:    Tilda Burrow* Kevia Zaucha V, MD - Primary  PHYSICIAN ASSISTANT:   ASSISTANTS: none   ANESTHESIA:   local and general  EBL:  Normal minimal   BLOOD ADMINISTERED:none  DRAINS: none   LOCAL MEDICATIONS USED:  MARCAINE    and Amount: 10 ml  SPECIMEN:  No Specimen  DISPOSITION OF SPECIMEN:  N/A  COUNTS:  YES  TOURNIQUET:  * No tourniquets in log *  DICTATION: .Dragon Dictation  PLAN OF CARE: Discharge to home after PACU  PATIENT DISPOSITION:  PACU - hemodynamically stable.   Delay start of Pharmacological VTE agent (>24hrs) due to surgical blood loss or risk of bleeding: not applicable Details of procedure: Patient was taken to the operating room prepped and draped for combined abdominal and vaginal procedure with moistened vaginal sponge stick placed in the vagina for uterine manipulation.  After timeout conducted and confirmed by operative team abdomen prepped and draped in preop process completed. An infraumbilical vertical 1 cm skin incision was made as well as a transverse suprapubic incision just above the old C-section line.  Veress needle was introduced being careful to oriented toward the pelvis while elevating the abdominal wall, with pneumoperitoneum performed under 5 mm intra-abdominal pressure x3.6 L of carbon dioxide.  The 5 mm camera was directly introduced under laparoscopic visualization and confirmed satisfactory safe placement of the camera.  Suprapubic trocar was placed under direct visualization.  Attention was directed to the left fallopian tube which was grasped at its midportion elevated and a Falope ring applied to  the midportion of the tube which was identified to its fimbriated end.  The knuckle of tube in the underlying mesosalpinx was infiltrated percutaneously with a 3 inch spinal needle using 5 cc of Marcaine with the opposite tube treated in similar fashion.  Photodocumentation taken to confirm the technical aspects of the procedure and the results.  120 cc of saline was placed in the abdomen to assist with evacuation of the carbon dioxide.  The abdomen was deflated laparoscopic trocar sleeves removed and subcuticular 4-0 Vicryl used to close the skin incisions followed by Steri-Strip placement and Band-Aid placement.  Sponge and needle counts correct patient to recovery room in stable condition

## 2018-04-08 NOTE — Telephone Encounter (Signed)
Pt called stating that the pain medication that she was prescribed after her surgery is not helping. Informed pt that I let Dr Emelda FearFerguson know and he stated that he would send a prescription for Vicodin to her pharmacy. Pt verbalized understanding.

## 2018-04-08 NOTE — Transfer of Care (Signed)
Immediate Anesthesia Transfer of Care Note  Patient: Jessica [name]  Procedure(s) Performed: LAPAROSCOPIC BILATERAL TUBAL STERILIZATION WITH FALLOPE RING APPLICATION (Bilateral )  Patient Location: PACU  Anesthesia Type:General  Level of Consciousness: awake, alert  and oriented  Airway & Oxygen Therapy: Patient Spontanous Breathing  Post-op Assessment: Report given to RN  Post vital signs: Reviewed and stable  Last Vitals:  Vitals Value Taken Time  BP 125/66 04/08/2018  9:01 AM  Temp 36.8 C 04/08/2018  9:01 AM  Pulse 78 04/08/2018  9:01 AM  Resp 17 04/08/2018  9:01 AM  SpO2 100 % 04/08/2018  9:01 AM    Last Pain:  Vitals:   04/08/18 0640  TempSrc: Oral  PainSc: 8       Patients Stated Pain Goal: 9 (04/08/18 0640)  Complications: No apparent anesthesia complications

## 2018-04-08 NOTE — Op Note (Signed)
Please see the brief operative note for surgical details 

## 2018-04-08 NOTE — Telephone Encounter (Signed)
Patient called stating that she has surgery this morning and Dr. Emelda FearFerguson has prescribed her Tramadol but she states that it is not helping and she needs something stronger. Please contact pt ASAP.
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
## 2018-04-08 NOTE — Anesthesia Procedure Notes (Signed)
Procedure Name: Intubation Date/Time: 04/08/2018 8:08 AM Performed by: Ollen Bowl, CRNA Pre-anesthesia Checklist: Patient identified, Patient being monitored, Timeout performed, Emergency Drugs available and Suction available Patient Re-evaluated:Patient Re-evaluated prior to induction Oxygen Delivery Method: Circle system utilized Preoxygenation: Pre-oxygenation with 100% oxygen Induction Type: IV induction Ventilation: Mask ventilation without difficulty Laryngoscope Size: Mac and 3 Grade View: Grade I Tube type: Oral Tube size: 7.0 mm Number of attempts: 1 Airway Equipment and Method: Stylet Placement Confirmation: ETT inserted through vocal cords under direct vision,  positive ETCO2 and breath sounds checked- equal and bilateral Secured at: 21 cm Tube secured with: Tape Dental Injury: Teeth and Oropharynx as per pre-operative assessment

## 2018-04-08 NOTE — Addendum Note (Signed)
Addendum  created 04/08/18 1124 by Moshe Salisburyaniel, Talya Quain E, CRNA   Charge Capture section accepted

## 2018-04-09 ENCOUNTER — Encounter (HOSPITAL_COMMUNITY): Payer: Self-pay | Admitting: Obstetrics and Gynecology

## 2018-04-09 ENCOUNTER — Telehealth: Payer: Self-pay | Admitting: *Deleted

## 2018-04-09 NOTE — Telephone Encounter (Signed)
Patient called requesting to speak to K[facility].  Informed patient she was seeing patients at this time and was there something I could help her with.  Patient says her husband is "flushing her medications down the toilet" and is wanting to see if Selena BattenKim will send in another prescription for her nerves.  Informed patient prescription was just prescribed on 8/19 and unfortunately Selena BattenKim would not be able to send in another prescription.  Informed patient there are dispensing laws that prescribers have to abide by and I was sorry she did not have her medications.  Patient verbalized understanding.

## 2018-04-19 ENCOUNTER — Encounter: Payer: Medicaid Other | Admitting: Obstetrics and Gynecology

## 2018-04-21 ENCOUNTER — Telehealth: Payer: Self-pay | Admitting: *Deleted

## 2018-04-21 NOTE — Telephone Encounter (Signed)
Patient called stating she missed her post-op appt on Monday and wants to come in today.  Informed patient we had no openings today and would need to reschedule until the next available. Verbalized understanding.

## 2018-04-26 ENCOUNTER — Other Ambulatory Visit: Payer: Medicaid Other | Admitting: Women's Health

## 2018-04-26 ENCOUNTER — Encounter: Payer: Medicaid Other | Admitting: Obstetrics and Gynecology

## 2018-04-26 ENCOUNTER — Encounter: Payer: Self-pay | Admitting: *Deleted

## 2018-04-28 ENCOUNTER — Encounter: Payer: Medicaid Other | Admitting: Obstetrics and Gynecology

## 2018-04-28 ENCOUNTER — Telehealth: Payer: Self-pay | Admitting: Obstetrics and Gynecology

## 2018-04-28 NOTE — Telephone Encounter (Signed)
Patient called re" missed appt. Pt reports that she's "fine" after surgery. Pt denies pain, is able to do normal activity . Denies any safety concerns. NO SI or HI Pt requests something be called in for her "Nerves" and says she has anxiety at being left alone when Husband goes to work. Able to care for baby.  Pt asked to make an appt at office, so we can further assess

## 2018-05-20 ENCOUNTER — Other Ambulatory Visit (HOSPITAL_COMMUNITY)
Admission: RE | Admit: 2018-05-20 | Discharge: 2018-05-20 | Disposition: A | Discharge status: Home / Self Care | Payer: Medicaid Other | Source: Ambulatory Visit | Attending: Obstetrics and Gynecology | Admitting: Obstetrics and Gynecology

## 2018-05-20 ENCOUNTER — Ambulatory Visit (INDEPENDENT_AMBULATORY_CARE_PROVIDER_SITE_OTHER): Payer: Medicaid Other | Admitting: Obstetrics and Gynecology

## 2018-05-20 ENCOUNTER — Encounter: Payer: Self-pay | Admitting: Obstetrics and Gynecology

## 2018-05-20 VITALS — BP 136/87 | HR 88 | Ht 62.0 in | Wt 137.8 lb

## 2018-05-20 DIAGNOSIS — Z0001 Encounter for general adult medical examination with abnormal findings: Secondary | ICD-10-CM

## 2018-05-20 DIAGNOSIS — Z01419 Encounter for gynecological examination (general) (routine) without abnormal findings: Secondary | ICD-10-CM

## 2018-05-20 MED ORDER — CLONAZEPAM 0.5 MG PO TABS: 0.5000 mg | ORAL_TABLET | Freq: Every day | ORAL | 1 refills | Status: DC

## 2018-05-20 MED ORDER — ESCITALOPRAM OXALATE 10 MG PO TABS: 10.0000 mg | ORAL_TABLET | Freq: Every day | ORAL | 5 refills | Status: DC

## 2018-05-20 NOTE — Progress Notes (Addendum)
Patient ID: Jessica [name], female   DOB: 1991/06/01, 27 y.o.   MRN: [id number]   Assessment:  1) Annual Gyn Exam 2) PP Anxiety - Rx Klonopin 0.5 x 1daily and Rx Lexapro 10 qd 3) F/U 4 wks Plan:  1. pap smear done, next pap due 3 years 2. return annually or prn 3. Review meds 1 month 3    Annual mammogram advised after age 39 Subjective:  Jessica [name] is a 27 y.o. female [phone] who presents for annual exam. No LMP recorded. The patient has complaints today of "really bad nerves" since her 4 month old son's birth. She gets nervous when she hears yelling and screaming. She does not want to get out of bed in the morning and is anxious, nervous, and can't focus. Pt can still take care of her baby and her and her partner are getting along well. She has tried Celexa and Lexapro but they don't work and she has not tried counseling. She has taken Klonopin and it is the only thing that helps her. She took it once a day but has run out. Denies SI/HI.  Her last pap was 12/26/2014 and was normal. She had a BTL on 04/08/2018 and since then has been struggling anxiety and pain. She was prescribed Rx Vicodin every 6 hrs prn.   The following portions of the patient's history were reviewed and updated as appropriate: allergies, current medications, past family history, past medical history, past social history, past surgical history and problem list. Past Medical History:  Diagnosis Date  . Abnormal Pap smear   . ADHD (attention deficit hyperactivity disorder)   . Chronic back pain   . Depression   . Migraine headache   . UTI (lower urinary tract infection) 01/12/2013  . Vaginal Pap smear, abnormal     Past Surgical History:  Procedure Laterality Date  . CESAREAN SECTION N/A 09/08/2013   Procedure: CESAREAN SECTION;  Surgeon: Tereso Newcomer, MD;  Location: [facility];  Service: Obstetrics;  Laterality: N/A;  . CESAREAN SECTION N/A 02/10/2018   Procedure: REPEAT CESAREAN SECTION;  Surgeon: Federico Flake, MD;  Location: [facility] SU[facility];  Service: Obstetrics;  Laterality: N/A;  . TOOTH EXTRACTION    . TUBAL LIGATION Bilateral 04/08/2018   Procedure: LAPAROSCOPIC BILATERAL TUBAL STERILIZATION WITH FALLOPE RING APPLICATION;  Surgeon: Tilda Burrow, MD;  Location: [facility];  Service: Gynecology;  Laterality: Bilateral;     Current Outpatient Medications:  .  acetaminophen (TYLENOL) 500 MG tablet, Take 500 mg by mouth every 6 (six) hours as needed for mild pain or headache., Disp: , Rfl:  .  clonazePAM (KLONOPIN) 0.5 MG tablet, Take 1 tablet (0.5 mg total) by mouth 2 (two) times daily as needed for anxiety. (Patient not taking: Reported on 04/05/2018), Disp: 30 tablet, Rfl: 0 .  HYDROcodone-acetaminophen (NORCO/VICODIN) 5-325 MG tablet, Take 1-2 tablets by mouth every 6 (six) hours as needed for moderate pain. May take with ibuprofen, Disp: 20 tablet, Rfl: 0 .  ketorolac (TORADOL) 10 MG tablet, Take 1 tablet (10 mg total) by mouth every 6 (six) hours as needed (five day limit postop)., Disp: 20 tablet, Rfl: 0  Review of Systems Constitutional: negative Gastrointestinal: negative Genitourinary: negative  Objective:  There were no vitals taken for this visit.   BMI: There is no height or weight on file to calculate BMI.  General Appearance: Alert, appropriate appearance for age. No acute distress HEENT: Grossly normal Neck / Thyroid:  Cardiovascular: RRR;  normal S1, S2, no murmur Lungs: CTA bilaterally Back: No CVAT Breast Exam: No dimpling, nipple retraction or discharge. No masses or nodes., Normal to inspection, Normal breast tissue bilaterally and No masses or nodes.No dimpling, nipple retraction or discharge. Gastrointestinal: Soft, non-tender, no masses or organomegaly Pelvic Exam: Cervix: tiny, normal in appearance Rectovaginal: not indicated Lymphatic Exam: Non-palpable nodes in neck, clavicular, axillary, or inguinal regions  Skin: no rash or  abnormalities Neurologic: Normal gait and speech, no tremor  Psychiatric: Alert and oriented, appropriate affect.  Urinalysis:Not done  By signing my name below, I, Pietro Cassis, attest that this documentation has been prepared under the direction and in the presence of Tilda Burrow, MD. Electronically Signed: Pietro Cassis, Medical Scribe. 05/20/18. 1:25 PM.  I personally performed the services described in this documentation, which was SCRIBED in my presence. The recorded information has been reviewed and considered accurate. It has been edited as necessary during review. Tilda Burrow, MD

## 2018-05-24 ENCOUNTER — Ambulatory Visit (INDEPENDENT_AMBULATORY_CARE_PROVIDER_SITE_OTHER): Payer: Medicaid Other | Admitting: Obstetrics and Gynecology

## 2018-05-24 ENCOUNTER — Encounter: Payer: Self-pay | Admitting: Obstetrics and Gynecology

## 2018-05-24 ENCOUNTER — Telehealth: Payer: Self-pay | Admitting: Obstetrics and Gynecology

## 2018-05-24 ENCOUNTER — Ambulatory Visit: Payer: Medicaid Other | Admitting: Obstetrics and Gynecology

## 2018-05-24 VITALS — BP 108/73 | HR 68 | Ht 62.0 in | Wt 137.8 lb

## 2018-05-24 DIAGNOSIS — O99345 Other mental disorders complicating the puerperium: Secondary | ICD-10-CM

## 2018-05-24 DIAGNOSIS — F458 Other somatoform disorders: Secondary | ICD-10-CM

## 2018-05-24 DIAGNOSIS — F53 Postpartum depression: Secondary | ICD-10-CM | POA: Diagnosis not present

## 2018-05-24 LAB — CYTOLOGY - PAP
Chlamydia: NEGATIVE
Diagnosis: NEGATIVE
Neisseria Gonorrhea: NEGATIVE

## 2018-05-24 MED ORDER — HYDROCODONE-ACETAMINOPHEN 5-325 MG PO TABS: 1.0000 | ORAL_TABLET | Freq: Four times a day (QID) | ORAL | 0 refills | Status: DC | PRN

## 2018-05-24 MED ORDER — CLONAZEPAM 0.5 MG PO TABS: 0.5000 mg | ORAL_TABLET | Freq: Every day | ORAL | 1 refills | Status: DC
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
## 2018-05-24 NOTE — Telephone Encounter (Signed)
Patient called stating that she forgot to ask Dr. Emelda Fear a question while she was here. Please contact pt

## 2018-05-24 NOTE — Telephone Encounter (Signed)
Patient states she passed a clot a little bigger than a quarter and wants to know if she should expect to pass more.  Informed patient I did not see mention of it in her note from today, but to let us know if she is passing anything larger.  Verbalized understanding.

## 2018-05-25 ENCOUNTER — Telehealth: Payer: Self-pay | Admitting: *Deleted

## 2018-05-25 NOTE — Telephone Encounter (Signed)
Called patient to let her know Dr Emelda Fear sent me a message regarding getting her set up with a counselor to discuss some of her stressors.  [facility] suggested to patient but did not want to write down number but stated she would call.

## 2018-05-27 ENCOUNTER — Telehealth: Payer: Self-pay | Admitting: Obstetrics and Gynecology

## 2018-05-27 NOTE — Telephone Encounter (Signed)
Patient calls in a "stressed out" state after going to pick up Klonipin  At pharmacy. Pharmacy has asked that I contact them.  Pt will only be given 10 Klonipin tablets, to allow her adequate time to seek assistance at [facility]. Pt aware that no further medication to be Rx'd til appt made. Pharmacist will reinforce what I told pt, that pt must seek appt with counsellor. Pt reports she has moved back with [facility], where she feels she should be., after a couple of days with grandmother. Pt has manipulated the information she delivers, withheld info about moving back to [facility]'s when she made the first phone call to me.. Pt to seek eval at [facility], and agrees to do so promptly

## 2018-06-03 ENCOUNTER — Ambulatory Visit (INDEPENDENT_AMBULATORY_CARE_PROVIDER_SITE_OTHER): Payer: Medicaid Other | Admitting: Obstetrics and Gynecology

## 2018-06-03 ENCOUNTER — Encounter: Payer: Self-pay | Admitting: Obstetrics and Gynecology

## 2018-06-03 VITALS — BP 131/86 | HR 92 | Ht 62.0 in | Wt 139.0 lb

## 2018-06-03 DIAGNOSIS — M549 Dorsalgia, unspecified: Secondary | ICD-10-CM | POA: Diagnosis not present

## 2018-06-03 LAB — POCT URINALYSIS DIPSTICK OB
Blood, UA: NEGATIVE
Glucose, UA: NEGATIVE
Ketones, UA: NEGATIVE
Leukocytes, UA: NEGATIVE
Nitrite, UA: NEGATIVE
POC,PROTEIN,UA: NEGATIVE

## 2018-06-03 MED ORDER — CLONAZEPAM 0.5 MG PO TABS: 0.5000 mg | ORAL_TABLET | Freq: Every day | ORAL | 0 refills | Status: DC
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
## 2018-06-29 ENCOUNTER — Emergency Department (HOSPITAL_COMMUNITY): Payer: Medicaid Other

## 2018-06-29 ENCOUNTER — Other Ambulatory Visit: Payer: Self-pay

## 2018-06-29 ENCOUNTER — Encounter: Payer: Self-pay | Admitting: Obstetrics and Gynecology

## 2018-06-29 ENCOUNTER — Ambulatory Visit (INDEPENDENT_AMBULATORY_CARE_PROVIDER_SITE_OTHER): Payer: Medicaid Other | Admitting: Obstetrics and Gynecology

## 2018-06-29 ENCOUNTER — Encounter (HOSPITAL_COMMUNITY): Payer: Self-pay

## 2018-06-29 ENCOUNTER — Emergency Department (HOSPITAL_COMMUNITY)
Admission: EM | Admit: 2018-06-29 | Discharge: 2018-06-29 | Disposition: A | Discharge status: Home / Self Care | Payer: Medicaid Other | Attending: Emergency Medicine | Admitting: Emergency Medicine

## 2018-06-29 VITALS — BP 96/61 | HR 76 | Ht 62.0 in | Wt 134.0 lb

## 2018-06-29 DIAGNOSIS — Y999 Unspecified external cause status: Secondary | ICD-10-CM | POA: Diagnosis not present

## 2018-06-29 DIAGNOSIS — S0990XA Unspecified injury of head, initial encounter: Secondary | ICD-10-CM

## 2018-06-29 DIAGNOSIS — F418 Other specified anxiety disorders: Secondary | ICD-10-CM | POA: Diagnosis not present

## 2018-06-29 DIAGNOSIS — W010XXA Fall on same level from slipping, tripping and stumbling without subsequent striking against object, initial encounter: Secondary | ICD-10-CM | POA: Diagnosis not present

## 2018-06-29 DIAGNOSIS — Y929 Unspecified place or not applicable: Secondary | ICD-10-CM | POA: Insufficient documentation

## 2018-06-29 DIAGNOSIS — F17210 Nicotine dependence, cigarettes, uncomplicated: Secondary | ICD-10-CM | POA: Insufficient documentation

## 2018-06-29 DIAGNOSIS — Y9389 Activity, other specified: Secondary | ICD-10-CM | POA: Insufficient documentation

## 2018-06-29 DIAGNOSIS — F419 Anxiety disorder, unspecified: Secondary | ICD-10-CM | POA: Diagnosis not present

## 2018-06-29 DIAGNOSIS — R55 Syncope and collapse: Secondary | ICD-10-CM | POA: Diagnosis not present

## 2018-06-29 HISTORY — DX: Anxiety disorder, unspecified: F41.9

## 2018-06-29 LAB — BASIC METABOLIC PANEL
Anion gap: 8 (ref 5–15)
BUN: 12 mg/dL (ref 6–20)
CO2: 20 mmol/L — ABNORMAL LOW (ref 22–32)
Calcium: 8.9 mg/dL (ref 8.9–10.3)
Chloride: 110 mmol/L (ref 98–111)
Creatinine, Ser: 0.76 mg/dL (ref 0.44–1.00)
GFR calc Af Amer: >60 mL/min (ref >60)
GFR calc non Af Amer: >60 mL/min (ref >60)
Glucose, Bld: 98 mg/dL (ref 70–99)
Potassium: 3.8 mmol/L (ref 3.5–5.1)
Sodium: 138 mmol/L (ref 135–145)

## 2018-06-29 LAB — I-STAT BETA HCG BLOOD, ED (MC, WL, AP ONLY): I-stat hCG, quantitative: <5 m[IU]/mL (ref <5)

## 2018-06-29 LAB — CBC WITH DIFFERENTIAL/PLATELET
Abs Immature Granulocytes: 0.01 10*3/uL (ref 0.00–0.07)
Basophils Absolute: 0 10*3/uL (ref 0.0–0.1)
Basophils Relative: 0 %
Eosinophils Absolute: 0.1 10*3/uL (ref 0.0–0.5)
Eosinophils Relative: 1 %
HCT: 39.4 % (ref 36.0–46.0)
Hemoglobin: 13 g/dL (ref 12.0–15.0)
Immature Granulocytes: 0 %
Lymphocytes Relative: 35 %
Lymphs Abs: 1.9 10*3/uL (ref 0.7–4.0)
MCH: 27.9 pg (ref 26.0–34.0)
MCHC: 33 g/dL (ref 30.0–36.0)
MCV: 84.5 fL (ref 80.0–100.0)
Monocytes Absolute: 0.4 10*3/uL (ref 0.1–1.0)
Monocytes Relative: 8 %
Neutro Abs: 3 10*3/uL (ref 1.7–7.7)
Neutrophils Relative %: 56 %
Platelets: 251 10*3/uL (ref 150–400)
RBC: 4.66 MIL/uL (ref 3.87–5.11)
RDW: 13.9 % (ref 11.5–15.5)
WBC: 5.4 10*3/uL (ref 4.0–10.5)
nRBC: 0 % (ref 0.0–0.2)

## 2018-06-29 LAB — CBG MONITORING, ED: Glucose-Capillary: 89 mg/dL (ref 70–99)

## 2018-06-29 MED ORDER — LORAZEPAM 2 MG/ML IJ SOLN
0.5000 mg | Freq: Once | INTRAMUSCULAR | Status: DC
  Filled 2018-06-29: qty 1

## 2018-06-29 MED ORDER — LORAZEPAM 2 MG/ML IJ SOLN
0.5000 mg | Freq: Once | INTRAMUSCULAR | Status: AC
  Administered 2018-06-29: 0.5 mg via INTRAVENOUS
  Filled 2018-06-29: qty 1

## 2018-06-29 MED ORDER — LORAZEPAM 0.5 MG PO TABS: 0.5000 mg | ORAL_TABLET | Freq: Two times a day (BID) | ORAL | 1 refills | Status: DC

## 2018-06-29 MED ORDER — ACETAMINOPHEN 500 MG PO TABS
1000.0000 mg | ORAL_TABLET | Freq: Once | ORAL | Status: AC
  Administered 2018-06-29: 1000 mg via ORAL
  Filled 2018-06-29: qty 2

## 2018-06-29 MED ORDER — ONDANSETRON HCL 4 MG/2ML IJ SOLN
4.0000 mg | Freq: Once | INTRAMUSCULAR | Status: AC
  Administered 2018-06-29: 4 mg via INTRAVENOUS
  Filled 2018-06-29: qty 2

## 2018-06-29 MED ORDER — KETOROLAC TROMETHAMINE 30 MG/ML IJ SOLN
15.0000 mg | Freq: Once | INTRAMUSCULAR | Status: AC
  Administered 2018-06-29: 15 mg via INTRAVENOUS
  Filled 2018-06-29: qty 1

## 2018-06-29 NOTE — ED Notes (Signed)
Pt has not provided enough for a urine specimen at this time states she will try again later. Repeatedly asking for pain and anxiety medication, notified pt PA has been notified and this RN with give any prescribed medications.

## 2018-06-29 NOTE — ED Notes (Signed)
Pt persistently calling staff for medications, pt notified no ordered medications at this time.

## 2018-06-29 NOTE — Progress Notes (Signed)
Patient ID: Jessica [name], female   DOB: 11/28/1990, 27 y.o.   MRN: [id number]    [facility] Clinic Visit  @DATE @            Patient name: Jessica [name] MRN [id number]  Date of birth: January 14, 1991  CC & HPI:  Jessica [name] is a 27 y.o. female presenting today for anxiety. Went to ER thought she had seizure but doctors said she passed.  She placed baby in swing before passing out, when EMTs arrived they placed medication under her nose (likely narcan) Says she had 5 friends die from cancer. One friend she said died of lukemia. She went to school with them and were close friends with them. She says that her klonopin is not working for her.   Is staying with baby's father but is doing better. Says life at home has gotten calmer. She has no energy to get things done. She just wants to lie down and  "do nothing". She says her nerves are still terrible.She says she wants to get away from everyone, says that her body is telling her to "get away". She has no good days. She has good day sometimes but haven;t had them recently. When she wants to get away she just walks away. She had no license and stopped going to school after 10th grade. Was referred to but didn't enjoy Jessica [name] services.  Says she has been smoking a lot recently and says her doctor wants her to see specialist and Jessica [name] is concerned about throat cancer.   ROS:  ROS  +presenting depression +tearful -fever -chills   Pertinent History Reviewed:   Reviewed: Medical         Past Medical History:  Diagnosis Date  . Abnormal Pap smear   . ADHD (attention deficit hyperactivity disorder)   . Anxiety   . Chronic back pain   . Depression   . Migraine headache   . UTI (lower urinary tract infection) 01/12/2013  . Vaginal Pap smear, abnormal                               Surgical Hx:    Past Surgical History:  Procedure Laterality Date  . CESAREAN SECTION N/A 09/08/2013   Procedure: CESAREAN SECTION;  Surgeon: Tereso Newcomer, MD;  Location: [facility];  Service: Obstetrics;  Laterality: N/A;  . CESAREAN SECTION N/A 02/10/2018   Procedure: REPEAT CESAREAN SECTION;  Surgeon: Federico Flake, MD;  Location: [facility];  Service: Obstetrics;  Laterality: N/A;  . TOOTH EXTRACTION    . TUBAL LIGATION Bilateral 04/08/2018   Procedure: LAPAROSCOPIC BILATERAL TUBAL STERILIZATION WITH FALLOPE RING APPLICATION;  Surgeon: Tilda Burrow, MD;  Location: [facility];  Service: Gynecology;  Laterality: Bilateral;   Medications: Reviewed & Updated - see associated section                       Current Outpatient Medications:  .  clonazePAM (KLONOPIN) 0.5 MG tablet, Take 1 tablet (0.5 mg total) by mouth daily. (Patient not taking: Reported on 06/29/2018), Disp: 10 tablet, Rfl: 0   Social History: Reviewed -  reports that she has been smoking cigarettes. She has been smoking about 1.00 pack per day. She has never used smokeless tobacco.  Objective Findings:  Vitals: Last menstrual period 06/11/2018, not currently breastfeeding.  PHYSICAL EXAMINATION General appearance - alert, well appearing, tearful,  oriented to person, place, and time and anxious Mental status - alert, oriented to person, place, and time, depressed mood  PELVIC DEFERRED  Assessment & Plan:   A:  1. Anxiety with depression  P:  1.  [facility] or preferably Dr Diannia Rudereborah Ross. 2 ativan 0.5 mg bid x 30 d refil x 1    By signing my name below, I, Arnette NorrisMari Johnson, attest that this documentation has been prepared under the direction and in the presence of Tilda BurrowFerguson, Cyera Balboni V, MD. Electronically Signed: Arnette NorrisMari Johnson Medical Scribe. 06/29/18. 3:52 PM.  I personally performed the services described in this documentation, which was SCRIBED in my presence. The recorded information has been reviewed and considered accurate. It has been edited as necessary during review. Tilda BurrowJohn V Jaedan Huttner, MD
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
## 2018-06-29 NOTE — ED Notes (Signed)
Pt repeatedly calling out for pain and anxiety medications, PA Idol notified.

## 2018-06-29 NOTE — ED Triage Notes (Signed)
Pt reports she got up to feed her infant and passed out.  Pt say she fell and hit floor and was unresponsive until EMS arrived.  EMS says pt was responsive to ammonia inhalant.  Pt Says her whole body hurts.

## 2018-06-29 NOTE — Discharge Instructions (Addendum)
Continue taking your clonazepam and your venlafaxine, however, you will need to discuss a change to your medicines with Dr Emelda FearFerguson if you feel these medicines are not working for you.   Your lab tests and imaging are negative for any acute illnesses or injuries from todays fall and syncopal event.

## 2018-06-29 NOTE — ED Notes (Signed)
Pt tearful at this time stating she has increasing anxiety and has not been put back on anxiety medication since prior to pregnancy. Denies post partum depression sx.

## 2018-06-29 NOTE — Patient Instructions (Signed)
Generalized Anxiety Disorder, Adult Generalized anxiety disorder (GAD) is a mental health disorder. People with this condition constantly worry about everyday events. Unlike normal anxiety, worry related to GAD is not triggered by a specific event. These worries also do not fade or get better with time. GAD interferes with life functions, including relationships, work, and school. GAD can vary from mild to severe. People with severe GAD can have intense waves of anxiety with physical symptoms (panic attacks). What are the causes? The exact cause of GAD is not known. What increases the risk? This condition is more likely to develop in:  Women.  People who have a family history of anxiety disorders.  People who are very shy.  People who experience very stressful life events, such as the death of a loved one.  People who have a very stressful family environment.  What are the signs or symptoms? People with GAD often worry excessively about many things in their lives, such as their health and family. They may also be overly concerned about:  Doing well at work.  Being on time.  Natural disasters.  Friendships.  Physical symptoms of GAD include:  Fatigue.  Muscle tension or having muscle twitches.  Trembling or feeling shaky.  Being easily startled.  Feeling like your heart is pounding or racing.  Feeling out of breath or like you cannot take a deep breath.  Having trouble falling asleep or staying asleep.  Sweating.  Nausea, diarrhea, or irritable bowel syndrome (IBS).  Headaches.  Trouble concentrating or remembering facts.  Restlessness.  Irritability.  How is this diagnosed? Your health care provider can diagnose GAD based on your symptoms and medical history. You will also have a physical exam. The health care provider will ask specific questions about your symptoms, including how severe they are, when they started, and if they come and go. Your health care  provider may ask you about your use of alcohol or drugs, including prescription medicines. Your health care provider may refer you to a mental health specialist for further evaluation. Your health care provider will do a thorough examination and may perform additional tests to rule out other possible causes of your symptoms. To be diagnosed with GAD, a person must have anxiety that:  Is out of his or her control.  Affects several different aspects of his or her life, such as work and relationships.  Causes distress that makes him or her unable to take part in normal activities.  Includes at least three physical symptoms of GAD, such as restlessness, fatigue, trouble concentrating, irritability, muscle tension, or sleep problems.  Before your health care provider can confirm a diagnosis of GAD, these symptoms must be present more days than they are not, and they must last for six months or longer. How is this treated? The following therapies are usually used to treat GAD:  Medicine. Antidepressant medicine is usually prescribed for long-term daily control. Antianxiety medicines may be added in severe cases, especially when panic attacks occur.  Talk therapy (psychotherapy). Certain types of talk therapy can be helpful in treating GAD by providing support, education, and guidance. Options include: ? Cognitive behavioral therapy (CBT). People learn coping skills and techniques to ease their anxiety. They learn to identify unrealistic or negative thoughts and behaviors and to replace them with positive ones. ? Acceptance and commitment therapy (ACT). This treatment teaches people how to be mindful as a way to cope with unwanted thoughts and feelings. ? Biofeedback. This process trains you to   manage your body's response (physiological response) through breathing techniques and relaxation methods. You will work with a therapist while machines are used to monitor your physical symptoms.  Stress  management techniques. These include yoga, meditation, and exercise.  A mental health specialist can help determine which treatment is best for you. Some people see improvement with one type of therapy. However, other people require a combination of therapies. Follow these instructions at home:  Take over-the-counter and prescription medicines only as told by your health care provider.  Try to maintain a normal routine.  Try to anticipate stressful situations and allow extra time to manage them.  Practice any stress management or self-calming techniques as taught by your health care provider.  Do not punish yourself for setbacks or for not making progress.  Try to recognize your accomplishments, even if they are small.  Keep all follow-up visits as told by your health care provider. This is important. Contact a health care provider if:  Your symptoms do not get better.  Your symptoms get worse.  You have signs of depression, such as: ? A persistently sad, cranky, or irritable mood. ? Loss of enjoyment in activities that used to bring you joy. ? Change in weight or eating. ? Changes in sleeping habits. ? Avoiding friends or family members. ? Loss of energy for normal tasks. ? Feelings of guilt or worthlessness. Get help right away if:  You have serious thoughts about hurting yourself or others. If you ever feel like you may hurt yourself or others, or have thoughts about taking your own life, get help right away. You can go to your nearest emergency department or call:  Your local emergency services (911 in the [place]  A suicide crisis helpline, such as the National Suicide Prevention Lifeline at 4690545778. This is open 24 hours a day.  Summary  Generalized anxiety disorder (GAD) is a mental health disorder that involves worry that is not triggered by a specific event.  People with GAD often worry excessively about many things in their lives, such as their health and  family.  GAD may cause physical symptoms such as restlessness, trouble concentrating, sleep problems, frequent sweating, nausea, diarrhea, headaches, and trembling or muscle twitching.  A mental health specialist can help determine which treatment is best for you. Some people see improvement with one type of therapy. However, other people require a combination of therapies. This information is not intended to replace advice given to you by your health care provider. Make sure you discuss any questions you have with your health care provider. Document Released: 11/22/2012 Document Revised: 06/17/2016 Document Reviewed: 06/17/2016 Elsevier Interactive Patient Education  2018 [employer]. Agoraphobia Agoraphobia is a mental health disorder. It is a type of anxiety or fear. People with agoraphobia fear public places where they may be trapped, helpless, or embarrassed in the event of a panic attack or a loss of control. They often start to avoid the feared situations or insist that another person go with them. Agoraphobia may interfere with normal daily activities and personal relationships. People with severe agoraphobia may become completely homebound and dependent on others for grocery shopping and other errands. Agoraphobia usually begins before age 22, but it can start in the older adult years. People with agoraphobia are at risk for other anxiety disorders, depression, and substance abuse. What are the causes? It is not known exactly what causes agoraphobia. What increases the risk? Agoraphobia is more common in women. People who have panic disorder or  have family members with agoraphobia are at higher risk of developing agoraphobia. What are the signs or symptoms? You may have agoraphobia if you have the following symptoms for 6 months or longer:  Intense fear about two or more of the following: ? Using public transportation, such as cars, buses, planes, trains, or ships. ? Being in open  spaces, such as parking lots, shopping malls, or bridges. ? Being in enclosed spaces, such as shops, theaters, or elevators. ? Standing in line or being in a crowd. ? Being outside the home alone.  Fear that is due to thoughts of being unable to escape or get help if certain events occur. The feared event may be a panic attack or panic-like symptoms, such as a racing heart, dizziness, and trouble breathing. In older people, the feared event may be a fall or loss of bowel control.  Reacting to feared situations by: ? Avoiding them. ? Requiring the presence of a companion. ? Enduring them with intense fear or anxiety.  Fear or anxiety that is out of proportion to the actual danger that is posed by the event and the situation.  How is this diagnosed? Agoraphobia may be diagnosed by your health care provider. You will be asked questions about your fears and how they have affected you. You may be asked about your medical history and your use of medicines, alcohol, or drugs. Your health care provider may do a physical exam and order lab tests or other studies to rule out a medical condition. You may also be referred to a mental health specialist. How is this treated? Treatment usually includes a combination of counseling and medicines.  Counseling or talk therapy. Talk therapy is provided by mental health specialists. The following forms of talk therapy can be especially helpful: ? Cognitive therapy. Cognitive therapy helps you to recognize and change unrealistic thoughts and beliefs that contribute to your fears. ? Exposure therapy. Exposure therapy helps you to face and overcome your fears in a relaxed state and a safe environment.  Medicines. The following types of medicines may be helpful: ? Antidepressants. Antidepressants are believed to affect certain chemicals in your brain. They can decrease general levels of anxiety and can help to prevent panic attacks. ? Benzodiazepines. These  medicines block feelings of anxiety and panic. They are very effective and act more quickly than antidepressants, but they are highly addictive. These medicines are recommended only for short-term use. ? Beta blockers. Beta blockers can reduce physical symptoms of anxiety, such as a racing heart, sweating, and tremor. They may help you to feel less tense and anxious.  Follow these instructions at home:  Keep all follow-up visits as directed by your health care provider. This is important.  Take all medicines only as directed by your health care provider.  Try to exercise, eat a healthy diet, and get plenty of sleep.  Do not drink alcohol.  Do not use illegal drugs. Where to find more information: For more information, visit the website of the Anxiety and Depression Association of [place] (ADAA): ProgramCam.de Contact a health care provider if:  Your fear or anxiety gets worse.  You have new fears or anxieties. Get help right away if:  You have serious thoughts about hurting yourself or someone else.  You have trouble breathing or have chest pain. This information is not intended to replace advice given to you by your health care provider. Make sure you discuss any questions you have with your health care provider. Document  Released: 12/18/2010 Document Revised: 01/03/2016 Document Reviewed: 11/28/2013 Elsevier Interactive Patient Education  [employer]2018 Elsevier Inc.

## 2018-07-07 ENCOUNTER — Telehealth: Payer: Self-pay | Admitting: *Deleted

## 2018-07-07 NOTE — Telephone Encounter (Signed)
Pt called stating that she had a "light run down her throat" 2 days ago and she started having vaginal bleeding. Advised pt that the procedure was not related to her uterus so that should not have caused vaginal bleeding. When asked, pt states that it could be time for her period. She states that she is changing about 5 pads a day and is passing some quarter sized clots. Advised that sounds like a period. Advised that if she began to bleed so heavily that she is changing a pad an hour or passing larger plum sized clots to call us back. Pt verbalized understanding. .Marland Kitchen

## 2018-07-30 ENCOUNTER — Encounter (HOSPITAL_COMMUNITY): Payer: Self-pay | Admitting: *Deleted

## 2018-07-30 ENCOUNTER — Emergency Department (HOSPITAL_COMMUNITY)
Admission: EM | Admit: 2018-07-30 | Discharge: 2018-07-30 | Disposition: A | Discharge status: Home / Self Care | Payer: Medicaid Other | Attending: Emergency Medicine | Admitting: Emergency Medicine

## 2018-07-30 ENCOUNTER — Other Ambulatory Visit: Payer: Self-pay

## 2018-07-30 DIAGNOSIS — F419 Anxiety disorder, unspecified: Secondary | ICD-10-CM | POA: Insufficient documentation

## 2018-07-30 DIAGNOSIS — F17210 Nicotine dependence, cigarettes, uncomplicated: Secondary | ICD-10-CM | POA: Diagnosis not present

## 2018-07-30 DIAGNOSIS — Z79899 Other long term (current) drug therapy: Secondary | ICD-10-CM | POA: Insufficient documentation

## 2018-07-30 MED ORDER — ALPRAZOLAM 0.5 MG PO TABS: 0.5000 mg | ORAL_TABLET | Freq: Two times a day (BID) | ORAL | 0 refills | Status: DC | PRN

## 2018-07-30 MED ORDER — ALPRAZOLAM 0.5 MG PO TABS
0.5000 mg | ORAL_TABLET | Freq: Once | ORAL | Status: AC
  Administered 2018-07-30: 0.5 mg via ORAL
  Filled 2018-07-30: qty 1
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
## 2018-07-30 NOTE — Discharge Instructions (Addendum)
Small prescription for anxiety medicine.

## 2018-07-30 NOTE — ED Triage Notes (Signed)
Requesting med for anxiety. Mother is stressed. Has small infant and toddler in her care. Toddler fell in the bathroom while in her care. Requesting something for nerves, has taken meds for nerves in the past

## 2018-08-20 ENCOUNTER — Ambulatory Visit: Payer: Medicaid Other | Admitting: Obstetrics & Gynecology

## 2018-08-26 ENCOUNTER — Ambulatory Visit: Payer: Medicaid Other | Admitting: Family Medicine

## 2018-09-03 ENCOUNTER — Telehealth: Payer: Self-pay | Admitting: *Deleted

## 2018-09-03 NOTE — Telephone Encounter (Signed)
Pt states that she has been having pain on her right side for 2 weeks. She describes it as a constant pain. She has tried taking ibuprofen, tylenol, and other OTC pain relievers and she states they arent working. Advised that she could try using a heating pad to the area. She states that it is not period related as that just started today. Offered to transfer pt to scheduling for an appt next week but pt states she will just call back Monday. Advised that I would send her issue to a provider and call her back if they have any other recommendations. Pt verbalized understanding.

## 2018-09-03 NOTE — Telephone Encounter (Signed)
Noted and agree with paln

## 2018-09-03 NOTE — Telephone Encounter (Signed)
LMOVM returning pts call.  

## 2018-09-03 NOTE — Telephone Encounter (Signed)
Patient called back to check and see if you had spoken to Dr. Despina Hidden yet.  [phone]

## 2018-09-03 NOTE — Telephone Encounter (Signed)
Pt called asking if Dr Despina Hidden had any other recommendations for the pain she is experiencing. Advised that he said nothing different from what I had already advised. Advised that she should make an appt to be evaluated by a provider. Pt connected to scheduling.

## 2018-09-10 ENCOUNTER — Encounter: Payer: Self-pay | Admitting: Obstetrics and Gynecology

## 2018-09-10 ENCOUNTER — Encounter (INDEPENDENT_AMBULATORY_CARE_PROVIDER_SITE_OTHER): Payer: Self-pay

## 2018-09-10 ENCOUNTER — Ambulatory Visit (INDEPENDENT_AMBULATORY_CARE_PROVIDER_SITE_OTHER): Payer: Medicaid Other | Admitting: Obstetrics and Gynecology

## 2018-09-10 VITALS — BP 141/85 | HR 103 | Ht 62.0 in | Wt 132.4 lb

## 2018-09-10 DIAGNOSIS — R109 Unspecified abdominal pain: Secondary | ICD-10-CM | POA: Diagnosis not present

## 2018-09-10 DIAGNOSIS — F418 Other specified anxiety disorders: Secondary | ICD-10-CM

## 2018-09-10 MED ORDER — CLONAZEPAM 0.5 MG PO TABS: 0.5000 mg | ORAL_TABLET | Freq: Two times a day (BID) | ORAL | 2 refills | Status: DC | PRN

## 2018-09-10 NOTE — Progress Notes (Signed)
Patient ID: AAIZA KALMAR, female   DOB: 1991-06-16, 28 y.o.   MRN: [id number]   [facility] Visit  @DATE @            Patient name: Jessica [name] MRN [id number]  Date of birth: 1991/02/22  CC & HPI:  Jessica [name] is a 28 y.o. female presenting today for pain around c-section scar. Says it hurts to walk and when she coughs pain has been going on around 2 months. Has tried warm bath, ibuprofen, trazodone,  Baby is 7 months. Has taken half of flexeril at night time to see if it will help but does not alleviate it. Take 100 mg of tramadol at a time.  ROS:  ROS +c-section incision tenderness -chills  All systems are negative except as noted in the HPI and PMH.    Pertinent History Reviewed:   Reviewed: Medical         Past Medical History:  Diagnosis Date  . Abnormal Pap smear   . ADHD (attention deficit hyperactivity disorder)   . Anxiety   . Chronic back pain   . Depression   . Migraine headache   . UTI (lower urinary tract infection) 01/12/2013  . Vaginal Pap smear, abnormal                               Surgical Hx:    Past Surgical History:  Procedure Laterality Date  . CESAREAN SECTION N/A 09/08/2013   Procedure: CESAREAN SECTION;  Surgeon: Tereso Newcomer, MD;  Location: [facility];  Service: Obstetrics;  Laterality: N/A;  . CESAREAN SECTION N/A 02/10/2018   Procedure: REPEAT CESAREAN SECTION;  Surgeon: Federico Flake, MD;  Location: [facility];  Service: Obstetrics;  Laterality: N/A;  . TOOTH EXTRACTION    . TUBAL LIGATION Bilateral 04/08/2018   Procedure: LAPAROSCOPIC BILATERAL TUBAL STERILIZATION WITH FALLOPE RING APPLICATION;  Surgeon: Tilda Burrow, MD;  Location: [facility];  Service: Gynecology;  Laterality: Bilateral;   Medications: Reviewed & Updated - see associated section                       Current Outpatient Medications:  .  ALPRAZolam (XANAX) 0.5 MG tablet, Take 1 tablet (0.5 mg total) by mouth 2 (two) times daily as needed for  anxiety., Disp: 5 tablet, Rfl: 0 .  clonazePAM (KLONOPIN) 0.5 MG tablet, Take 1 tablet (0.5 mg total) by mouth daily. (Patient not taking: Reported on 06/29/2018), Disp: 10 tablet, Rfl: 0 .  LORazepam (ATIVAN) 0.5 MG tablet, Take 1 tablet (0.5 mg total) by mouth 2 (two) times daily., Disp: 60 tablet, Rfl: 1   Social History: Reviewed -  reports that she has been smoking cigarettes. She has been smoking about 1.00 pack per day. She has never used smokeless tobacco.  Objective Findings:  Vitals: not currently breastfeeding.  PHYSICAL EXAMINATION General appearance - alert, well appearing, and in no distress and oriented to person, place, and time Mental status - alert, oriented to person, place, and time, normal mood, behavior, speech, dress, motor activity, and thought processes, affect appropriate to mood  PELVIC External genitalia - incision pain  Assessment & Plan:   A:  1.  Incision pain 2. anxiety  P:  1.  rx klonopin 0.5 bid prn   By signing my name below, I, Arnette Norris, attest that this documentation has been prepared under the direction  and in the presence of Tilda BurrowFerguson, Ashle Stief V, MD. Electronically Signed: Arnette NorrisMari Johnson Medical Scribe. 09/10/18. 12:14 PM.  I personally performed the services described in this documentation, which was SCRIBED in my presence. The recorded information has been reviewed and considered accurate. It has been edited as necessary during review. Tilda BurrowJohn V Anguel Delapena, MD

## 2018-09-13 ENCOUNTER — Encounter (HOSPITAL_COMMUNITY): Payer: Self-pay

## 2018-09-13 ENCOUNTER — Other Ambulatory Visit: Payer: Self-pay

## 2018-09-13 ENCOUNTER — Emergency Department (HOSPITAL_COMMUNITY)
Admission: EM | Admit: 2018-09-13 | Discharge: 2018-09-13 | Disposition: A | Discharge status: Home / Self Care | Payer: Medicaid Other | Attending: Emergency Medicine | Admitting: Emergency Medicine

## 2018-09-13 DIAGNOSIS — K047 Periapical abscess without sinus: Secondary | ICD-10-CM

## 2018-09-13 DIAGNOSIS — Z79899 Other long term (current) drug therapy: Secondary | ICD-10-CM | POA: Insufficient documentation

## 2018-09-13 DIAGNOSIS — F17210 Nicotine dependence, cigarettes, uncomplicated: Secondary | ICD-10-CM | POA: Insufficient documentation

## 2018-09-13 DIAGNOSIS — K0889 Other specified disorders of teeth and supporting structures: Secondary | ICD-10-CM | POA: Diagnosis present

## 2018-09-13 MED ORDER — HYDROCODONE-ACETAMINOPHEN 5-325 MG PO TABS: 1.0000 | ORAL_TABLET | ORAL | 0 refills | Status: AC | PRN

## 2018-09-13 MED ORDER — OXYCODONE-ACETAMINOPHEN 5-325 MG PO TABS
1.0000 | ORAL_TABLET | Freq: Once | ORAL | Status: AC
  Administered 2018-09-13: 1 via ORAL
  Filled 2018-09-13: qty 1

## 2018-09-13 NOTE — ED Notes (Signed)
Pt has driver coming to pick her up

## 2018-09-13 NOTE — ED Notes (Signed)
Pt states she was given antibiotics today and was sent here for antibiotics. Is scheduled to have tooth pulled on Monday. States dentist started her on antibiotics today, Amoxicillin. Painful to eat.
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
## 2018-09-13 NOTE — Discharge Instructions (Addendum)
I have provided medication for your dental pain, please take this as directed. Please take need to take your antibiotic to help with your infection.  Please follow-up with dentist as directed.  If you experience any fever, difficulty swallowing, worsening symptoms you may return to the ED.

## 2018-09-13 NOTE — ED Notes (Signed)
Pt left with friend driving 

## 2018-09-13 NOTE — ED Triage Notes (Signed)
Pt reports dental abscess.  Says dentist told her to come to er for antibiotics.

## 2018-09-21 ENCOUNTER — Telehealth: Payer: Self-pay | Admitting: Obstetrics and Gynecology

## 2018-09-21 NOTE — Telephone Encounter (Signed)
Calling about getting pain meds/ had tooth pulled and pharmacy said [facility] was the only one that could write ?? Call  [phone]/ tina if Shadiamond does not answer

## 2018-09-22 NOTE — Telephone Encounter (Signed)
Pt called, unavailable. Message left for pt to update office on pain level. Note: it is usual for the dentist to manage pain re" dental extractions.

## 2018-10-11 ENCOUNTER — Emergency Department (HOSPITAL_COMMUNITY)
Admission: EM | Admit: 2018-10-11 | Discharge: 2018-10-11 | Disposition: A | Discharge status: Home / Self Care | Payer: Medicaid Other | Attending: Emergency Medicine | Admitting: Emergency Medicine

## 2018-10-11 ENCOUNTER — Encounter (HOSPITAL_COMMUNITY): Payer: Self-pay | Admitting: Emergency Medicine

## 2018-10-11 ENCOUNTER — Emergency Department (HOSPITAL_COMMUNITY): Payer: Medicaid Other

## 2018-10-11 DIAGNOSIS — R55 Syncope and collapse: Secondary | ICD-10-CM | POA: Diagnosis not present

## 2018-10-11 DIAGNOSIS — Y999 Unspecified external cause status: Secondary | ICD-10-CM | POA: Insufficient documentation

## 2018-10-11 DIAGNOSIS — Y929 Unspecified place or not applicable: Secondary | ICD-10-CM | POA: Insufficient documentation

## 2018-10-11 DIAGNOSIS — M546 Pain in thoracic spine: Secondary | ICD-10-CM | POA: Diagnosis not present

## 2018-10-11 DIAGNOSIS — R51 Headache: Secondary | ICD-10-CM | POA: Diagnosis not present

## 2018-10-11 DIAGNOSIS — Z79899 Other long term (current) drug therapy: Secondary | ICD-10-CM | POA: Diagnosis not present

## 2018-10-11 DIAGNOSIS — Y9389 Activity, other specified: Secondary | ICD-10-CM | POA: Insufficient documentation

## 2018-10-11 DIAGNOSIS — S161XXA Strain of muscle, fascia and tendon at neck level, initial encounter: Secondary | ICD-10-CM | POA: Insufficient documentation

## 2018-10-11 DIAGNOSIS — S199XXA Unspecified injury of neck, initial encounter: Secondary | ICD-10-CM | POA: Diagnosis present

## 2018-10-11 DIAGNOSIS — W0110XA Fall on same level from slipping, tripping and stumbling with subsequent striking against unspecified object, initial encounter: Secondary | ICD-10-CM | POA: Diagnosis not present

## 2018-10-11 LAB — CBC WITH DIFFERENTIAL/PLATELET
Abs Immature Granulocytes: 0.01 10*3/uL (ref 0.00–0.07)
Basophils Absolute: 0 10*3/uL (ref 0.0–0.1)
Basophils Relative: 0 %
Eosinophils Absolute: 0 10*3/uL (ref 0.0–0.5)
Eosinophils Relative: 1 %
HCT: 40.5 % (ref 36.0–46.0)
Hemoglobin: 13.2 g/dL (ref 12.0–15.0)
Immature Granulocytes: 0 %
Lymphocytes Relative: 44 %
Lymphs Abs: 2.2 10*3/uL (ref 0.7–4.0)
MCH: 28 pg (ref 26.0–34.0)
MCHC: 32.6 g/dL (ref 30.0–36.0)
MCV: 85.8 fL (ref 80.0–100.0)
Monocytes Absolute: 0.3 10*3/uL (ref 0.1–1.0)
Monocytes Relative: 7 %
Neutro Abs: 2.4 10*3/uL (ref 1.7–7.7)
Neutrophils Relative %: 48 %
Platelets: 262 10*3/uL (ref 150–400)
RBC: 4.72 MIL/uL (ref 3.87–5.11)
RDW: 12.6 % (ref 11.5–15.5)
WBC: 5 10*3/uL (ref 4.0–10.5)
nRBC: 0 % (ref 0.0–0.2)

## 2018-10-11 LAB — URINALYSIS, ROUTINE W REFLEX MICROSCOPIC
Bilirubin Urine: NEGATIVE
Glucose, UA: NEGATIVE mg/dL
Hgb urine dipstick: NEGATIVE
Ketones, ur: NEGATIVE mg/dL
Leukocytes,Ua: NEGATIVE
Nitrite: NEGATIVE
Protein, ur: NEGATIVE mg/dL
Specific Gravity, Urine: 1.018 (ref 1.005–1.030)
pH: 5 (ref 5.0–8.0)

## 2018-10-11 LAB — COMPREHENSIVE METABOLIC PANEL
ALT: 11 U/L (ref 0–44)
AST: 15 U/L (ref 15–41)
Albumin: 4.1 g/dL (ref 3.5–5.0)
Alkaline Phosphatase: 41 U/L (ref 38–126)
Anion gap: 8 (ref 5–15)
BUN: 10 mg/dL (ref 6–20)
CO2: 22 mmol/L (ref 22–32)
Calcium: 9.2 mg/dL (ref 8.9–10.3)
Chloride: 108 mmol/L (ref 98–111)
Creatinine, Ser: 0.57 mg/dL (ref 0.44–1.00)
GFR calc Af Amer: >60 mL/min (ref >60)
GFR calc non Af Amer: >60 mL/min (ref >60)
Glucose, Bld: 90 mg/dL (ref 70–99)
Potassium: 3.3 mmol/L — ABNORMAL LOW (ref 3.5–5.1)
Sodium: 138 mmol/L (ref 135–145)
Total Bilirubin: 0.7 mg/dL (ref 0.3–1.2)
Total Protein: 6.9 g/dL (ref 6.5–8.1)

## 2018-10-11 LAB — PREGNANCY, URINE: Preg Test, Ur: NEGATIVE

## 2018-10-11 MED ORDER — LORAZEPAM 1 MG PO TABS
1.0000 mg | ORAL_TABLET | Freq: Once | ORAL | Status: AC
  Administered 2018-10-11: 1 mg via ORAL

## 2018-10-11 MED ORDER — LORAZEPAM 1 MG PO TABS
ORAL_TABLET | ORAL | Status: AC
  Filled 2018-10-11: qty 1

## 2018-10-11 MED ORDER — MORPHINE SULFATE (PF) 4 MG/ML IV SOLN
4.0000 mg | Freq: Once | INTRAVENOUS | Status: AC
  Administered 2018-10-11: 4 mg via INTRAVENOUS
  Filled 2018-10-11: qty 1

## 2018-10-11 MED ORDER — KETOROLAC TROMETHAMINE 15 MG/ML IJ SOLN
15.0000 mg | Freq: Once | INTRAMUSCULAR | Status: DC
  Filled 2018-10-11: qty 1

## 2018-10-11 NOTE — ED Notes (Signed)
C collar removed

## 2018-10-11 NOTE — ED Triage Notes (Signed)
Pt states she thinks she had a syncopal episode this morning while making breakfast.  Family called EMS but was not there when they arrived and had left pt laying on the floor.  Pt states this happens every few months.  C/o pain all over.  C collar in place by ems.

## 2018-10-11 NOTE — Discharge Instructions (Signed)
Use tylenol and motrin for pain. Use flexeril for muscle spasms. Return for worsening or new symptoms.

## 2018-10-11 NOTE — ED Notes (Signed)
Pt now requesting food.

## 2018-10-11 NOTE — ED Notes (Signed)
Pt decided she did not wish to wait for her prescription.  Left with only d/c instructions and no rx.  States she was not going to take it anyways.  Left ambulatory with steady gait.

## 2018-10-11 NOTE — ED Notes (Signed)
MD at bedside. 

## 2018-10-11 NOTE — ED Notes (Signed)
Pt to MRI

## 2018-10-11 NOTE — ED Notes (Signed)
Pt ambulating to restroom to give urine sample.

## 2018-10-11 NOTE — ED Notes (Signed)
Pt continually asking for Morphine and taking off Vital Signs monitoring.
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
## 2018-10-11 NOTE — ED Notes (Signed)
Have given pt a meal tray  

## 2018-10-14 ENCOUNTER — Ambulatory Visit: Payer: Self-pay | Admitting: Advanced Practice Midwife

## 2018-10-21 ENCOUNTER — Ambulatory Visit: Payer: Self-pay | Admitting: Obstetrics and Gynecology

## 2018-11-03 ENCOUNTER — Ambulatory Visit (INDEPENDENT_AMBULATORY_CARE_PROVIDER_SITE_OTHER): Payer: Medicaid Other | Admitting: Obstetrics and Gynecology

## 2018-11-03 ENCOUNTER — Encounter: Payer: Self-pay | Admitting: Obstetrics and Gynecology

## 2018-11-03 ENCOUNTER — Telehealth: Payer: Self-pay | Admitting: *Deleted

## 2018-11-03 ENCOUNTER — Other Ambulatory Visit: Payer: Self-pay

## 2018-11-03 VITALS — BP 105/65 | HR 74 | Ht 62.0 in | Wt 129.0 lb

## 2018-11-03 DIAGNOSIS — O99345 Other mental disorders complicating the puerperium: Secondary | ICD-10-CM

## 2018-11-03 DIAGNOSIS — F418 Other specified anxiety disorders: Secondary | ICD-10-CM | POA: Diagnosis not present

## 2018-11-03 DIAGNOSIS — F53 Postpartum depression: Secondary | ICD-10-CM

## 2018-11-03 MED ORDER — CLONAZEPAM 0.25 MG PO TBDP: 0.2500 mg | ORAL_TABLET | Freq: Two times a day (BID) | ORAL | 2 refills | Status: DC

## 2018-11-03 NOTE — Progress Notes (Signed)
Patient ID: CHENOA WARNECKE, female   DOB: 04-Aug-1991, 28 y.o.   MRN: [id number]   [facility] Clinic Visit  @DATE @            Patient name: KEMYRA WESTERKAMP MRN [id number]  Date of birth: [birth year]  CC & HPI:  ROSSANNA HERDON is a 28 y.o. female presenting today for depression and anxiety. Says she is trying to do better since the birth of the baby but is having a hard time sleeping, concentrating and focusing. Used to journal when she was in counseling, counselor would read [employer] and they would discuss what she had written down. Has PCP in [place] but needs to find a new one since they don't see eye to eye.  Is doing better with partner, says nerves has been shot. Son doesn't seem to listen most days and is finding it difficult to be at home all day and is used to being at school. Daughter Ava is 8 months and is doing well. Wants to reduce dosage of medication so she can get a refill today. She takes it PRN, but wants to try to ease herself off medicine. She has been thinking more positively. Was taking depression medication but ha stopped taking it since being able to see Dr. Erma Pinto. Says anxiety is doing better, and hasn't been worry about situations too much so that she doesn't cause herself a mental spiral.  ROS:  ROS   All systems are negative except as noted in the HPI and PMH.   Pertinent History Reviewed:   Reviewed:  Medical         Past Medical History:  Diagnosis Date  . Abnormal Pap smear   . ADHD (attention deficit hyperactivity disorder)   . Anxiety   . Chronic back pain   . Depression   . Migraine headache   . UTI (lower urinary tract infection) 01/12/2013  . Vaginal Pap smear, abnormal                               Surgical Hx:    Past Surgical History:  Procedure Laterality Date  . CESAREAN SECTION N/A 09/08/2013   Procedure: CESAREAN SECTION;  Surgeon: Tereso Newcomer, MD;  Location: [facility];  Service: Obstetrics;  Laterality: N/A;  . CESAREAN SECTION  N/A 02/10/2018   Procedure: REPEAT CESAREAN SECTION;  Surgeon: Federico Flake, MD;  Location: [facility] BIRTHING SUITES;  Service: Obstetrics;  Laterality: N/A;  . TOOTH EXTRACTION    . TUBAL LIGATION Bilateral 04/08/2018   Procedure: LAPAROSCOPIC BILATERAL TUBAL STERILIZATION WITH FALLOPE RING APPLICATION;  Surgeon: Tilda Burrow, MD;  Location: [facility];  Service: Gynecology;  Laterality: Bilateral;   Medications: Reviewed & Updated - see associated section                       Current Outpatient Medications:  .  clonazePAM (KLONOPIN) 0.5 MG tablet, Take 1 tablet (0.5 mg total) by mouth 2 (two) times daily as needed for anxiety. Or insomnia, Disp: 30 tablet, Rfl: 2   Social History: Reviewed -  reports that she has been smoking cigarettes. She has been smoking about 1.00 pack per day. She has never used smokeless tobacco.  Objective Findings:  Vitals: Blood pressure 105/65, pulse 74, height 5\' 2"  (1.575 m), weight 129 lb (58.5 kg), not currently breastfeeding.  PHYSICAL EXAMINATION General appearance - improved, smiling, more calm  Mental status - alert, oriented to person, place, and time, normal mood, behavior, speech, dress, motor activity, and thought processes, affect appropriate to mood   PELVIC Discussion only  Assessment & Plan:   A:  1. Anxiety, improving 2. Resolving depression 3. Medication management  P:  1. Journaling thoughts which has worked in past as a youth. 2. Klonopin reduce to  0.25mg   3. F/u 3 months or prn    By signing my name below, I, Arnette Norris, attest that this documentation has been prepared under the direction and in the presence of Tilda Burrow, MD. Electronically Signed: Arnette Norris Medical Scribe. 11/03/18. 11:18 AM.  I personally performed the services described in this documentation, which was SCRIBED in my presence. The recorded information has been reviewed and considered accurate. It has been edited as necessary during  review. Tilda Burrow, MD

## 2018-11-03 NOTE — Telephone Encounter (Signed)
[facility] informing of visitor restrictions. Advised to call the office if  Have hadcontact with anyone with suspected or confirmed COVID-19 in the last month. Or if experiencing fever, cough, SOB, muscle pain, diarrhea, rash, vomiting, abdominal pain, red eye, weakness, bruising or bleeding, joint pain, or severe headache.

## 2018-11-08 ENCOUNTER — Telehealth: Payer: Self-pay | Admitting: *Deleted

## 2018-11-08 NOTE — Telephone Encounter (Signed)
Patient called stating  [facility] in [place] needs to speak with Dr Emelda Fear regarding clonazepam. Please advise

## 2018-11-09 ENCOUNTER — Other Ambulatory Visit: Payer: Self-pay

## 2018-11-09 ENCOUNTER — Encounter: Payer: Self-pay | Admitting: Obstetrics and Gynecology

## 2018-11-09 ENCOUNTER — Ambulatory Visit (INDEPENDENT_AMBULATORY_CARE_PROVIDER_SITE_OTHER): Payer: Medicaid Other | Admitting: Obstetrics and Gynecology

## 2018-11-09 DIAGNOSIS — F418 Other specified anxiety disorders: Secondary | ICD-10-CM | POA: Diagnosis not present

## 2018-11-09 DIAGNOSIS — F430 Acute stress reaction: Secondary | ICD-10-CM

## 2018-11-09 NOTE — Progress Notes (Signed)
TELEHEALTH VIRTUAL GYNECOLOGY VISIT ENCOUNTER NOTE  I connected with Jessica [name] on 11/09/18 at  9:15 AM EDT by telephone at home and office staff has verified that I am speaking with the correct person using two identifiers.   I discussed the limitations, risks, security and privacy concerns of performing an evaluation and management service by telephone and the availability of in person appointments.  Staff is also discussed with the patient that there may be a patient responsible charge related to this service. The patient expressed understanding and agreed to proceed.   History:  Jessica [name] is a 28 y.o. [phone] female being evaluated today for acute and chronic situational stress. She denies any abnormal vaginal discharge, bleeding, pelvic pain or other concerns.  Currently she is in extreme grief as her mother has experienced an aneurysm bursting in the brain, is on life support with a decision to be made in the next 48 hours is to stop life support.  The patient cannot see her mother due to COVID-19 restrictions Patient does not have a Optician, dispensing.  She has 1 brother with whom she has a modest amount of support  Patient is using her Ativan 0.25 mg 2 tablets twice daily she has 30 left from her 1 month supply.  She is going to run out early which is fine we need to adjust the dose I told her not to exceed 3 tablets twice daily and will check in 2 days to see how many she has left.   Past Medical History:  Diagnosis Date  . Abnormal Pap smear   . ADHD (attention deficit hyperactivity disorder)   . Anxiety   . Chronic back pain   . Depression   . Migraine headache   . UTI (lower urinary tract infection) 01/12/2013  . Vaginal Pap smear, abnormal    Past Surgical History:  Procedure Laterality Date  . CESAREAN SECTION N/A 09/08/2013   Procedure: CESAREAN SECTION;  Surgeon: Tereso Newcomer, MD;  Location: [facility];  Service: Obstetrics;  Laterality: N/A;  . CESAREAN SECTION N/A  02/10/2018   Procedure: REPEAT CESAREAN SECTION;  Surgeon: Federico Flake, MD;  Location: [facility] BIRTHING SUITES;  Service: Obstetrics;  Laterality: N/A;  . TOOTH EXTRACTION    . TUBAL LIGATION Bilateral 04/08/2018   Procedure: LAPAROSCOPIC BILATERAL TUBAL STERILIZATION WITH FALLOPE RING APPLICATION;  Surgeon: Tilda Burrow, MD;  Location: [facility];  Service: Gynecology;  Laterality: Bilateral;   The following portions of the patient's history were reviewed and updated as appropriate: allergies, current medications, past family history, past medical history, past social history, past surgical history and problem list.   Health Maintenance:  Normal pap . Marland Kitchen   Review of Systems:  Pertinent items noted in HPI and remainder of comprehensive ROS otherwise negative.  Physical Exam:  Physical exam deferred due to nature of the encounter  Labs and Imaging No results found for this or any previous visit (from the past 336 hour(s)).     Assessment and Plan:    Acute situational stress related to mother's terminal illness .      I discussed the assessment and treatment plan with the patient. The patient was provided an opportunity to ask questions and all were answered. The patient agreed with the plan and demonstrated an understanding of the instructions.   The patient was advised to call back or seek an in-person evaluation/go to the ED if the symptoms worsen or if the condition fails  to improve as anticipated.  We will talk again in 48 hours the importance of realistic expectations discussed.  The patient denies suicidal or homicidal ideation  I provided 20 minutes of non-face-to-face time during this encounter.   Tilda Burrow, MD [facility], [facility]

## 2018-11-11 ENCOUNTER — Encounter: Payer: Self-pay | Admitting: Obstetrics and Gynecology

## 2018-11-11 ENCOUNTER — Telehealth: Payer: Self-pay | Admitting: *Deleted

## 2018-11-11 ENCOUNTER — Ambulatory Visit: Payer: Self-pay | Admitting: Obstetrics and Gynecology

## 2018-11-11 ENCOUNTER — Ambulatory Visit (INDEPENDENT_AMBULATORY_CARE_PROVIDER_SITE_OTHER): Payer: Medicaid Other | Admitting: Obstetrics and Gynecology

## 2018-11-11 ENCOUNTER — Other Ambulatory Visit: Payer: Self-pay

## 2018-11-11 DIAGNOSIS — F418 Other specified anxiety disorders: Secondary | ICD-10-CM

## 2018-11-11 DIAGNOSIS — F4321 Adjustment disorder with depressed mood: Secondary | ICD-10-CM

## 2018-11-11 NOTE — Telephone Encounter (Signed)
Attempted to connect with patient twice for her tele visit with Dr. Emelda Fear. No answer. VM left.

## 2018-11-11 NOTE — Progress Notes (Signed)
Patient ID: Jessica [name], female   DOB: Mar 31, 1991, 28 y.o.   MRN: [id number]    TELEHEALTH VIRTUAL GYNECOLOGY VISIT ENCOUNTER NOTE  I was connected with Jessica [name] on @TODAY @ at  2:00 PM EDT by telephone at home and verified that I am speaking with the correct person using two identifiers.   I discussed the limitations, risks, security and privacy concerns of performing an evaluation and management service by telephone and the availability of in person appointments. I also discussed with the patient that there may be a patient responsible charge related to this service. The patient expressed understanding and agreed to proceed.   History:  Jessica [name] is a 28 y.o. [phone] female being evaluated today for f/u on situational stressor due to mother having brain anuerysm. She says that they have decided to cease life support in [place] and is sitting beside her mother at time of phone call. She has spoken with chaplin at hospital, saying that she only prayed. Says she didn't do good on the anxiety medication "watching her mother dying". Wants someone to pick up her medicines but is advised against it. She has agreed to go with someone to pick up her medication.  Says she is almost out but has been taking them as prescribed x3 tablet BID, which leaves her with 5 more days. She can only retrieve her medication Monday 11/15/2018  .       Past Medical History:  Diagnosis Date   Abnormal Pap smear    ADHD (attention deficit hyperactivity disorder)    Anxiety    Chronic back pain    Depression    Migraine headache    UTI (lower urinary tract infection) 01/12/2013   Vaginal Pap smear, abnormal    Past Surgical History:  Procedure Laterality Date   CESAREAN SECTION N/A 09/08/2013   Procedure: CESAREAN SECTION;  Surgeon: Tereso Newcomer, MD;  Location: [facility];  Service: Obstetrics;  Laterality: N/A;   CESAREAN SECTION N/A 02/10/2018   Procedure: REPEAT CESAREAN SECTION;   Surgeon: Federico Flake, MD;  Location: [facility] BIRTHING SUITES;  Service: Obstetrics;  Laterality: N/A;   TOOTH EXTRACTION     TUBAL LIGATION Bilateral 04/08/2018   Procedure: LAPAROSCOPIC BILATERAL TUBAL STERILIZATION WITH FALLOPE RING APPLICATION;  Surgeon: Tilda Burrow, MD;  Location: [facility];  Service: Gynecology;  Laterality: Bilateral;   The following portions of the patient's history were reviewed and updated as appropriate: allergies, current medications, past family history, past medical history, past social history, past surgical history and problem list.    Review of Systems:  Pertinent items noted in HPI and remainder of comprehensive ROS otherwise negative.  Physical Exam:  Physical exam deferred due to nature of the encounter  Labs and Imaging No results found for this or any previous visit (from the past 336 hour(s)). No results found.    Assessment and Plan:     A: Acute grief reaction     P: Refill Klonopin   I discussed the assessment and treatment plan with the patient. The patient was provided an opportunity to ask questions and all were answered. The patient agreed with the plan and demonstrated an understanding of the instructions.   The patient was advised to call back or seek an in-person evaluation/go to the ED if the symptoms worsen or if the condition fails to improve as anticipated.  I provided 15 minutes of non-face-to-face time during this encounter.   By signing my name below,  I, Arnette Norris, attest that this documentation has been prepared under the direction and in the presence of Tilda Burrow, MD. Electronically Signed: Arnette Norris Medical Scribe. 11/11/18. 2:15 PM.  I personally performed the services described in this documentation, which was SCRIBED in my presence. The recorded information has been reviewed and considered accurate. It has been edited as necessary during review. Tilda Burrow, MD

## 2018-11-12 ENCOUNTER — Telehealth: Payer: Self-pay | Admitting: *Deleted

## 2018-11-12 MED ORDER — CLONAZEPAM 0.5 MG PO TBDP: 0.5000 mg | ORAL_TABLET | Freq: Two times a day (BID) | ORAL | 1 refills | Status: DC

## 2018-11-12 NOTE — Telephone Encounter (Signed)
Patient called stating she wants Dr Emelda Fear to change her clonazepam from dissolvable to regular tablets.  Informed Dr Emelda Fear was not in the office today but to try the dissolvable as they should work.  Pt verbalized understanding and stated she would.

## 2018-12-13 ENCOUNTER — Telehealth: Payer: Self-pay | Admitting: *Deleted

## 2018-12-13 ENCOUNTER — Telehealth: Payer: Self-pay | Admitting: Obstetrics and Gynecology

## 2018-12-13 NOTE — Telephone Encounter (Signed)
Please call needs something for pain called into [facility] [address] in [place]

## 2018-12-13 NOTE — Telephone Encounter (Signed)
Called patient back. She says that she is having low back pain at the site of her epidural. She is requesting to have pain medication sent in. She has tried tramadol, flexeril, tylenol, ibuprofen, aleve, warm compresses, and warm bath. She states that it is unbearable and she needs something sent in. Advised I would send message to the providers in office. Patient states that she would like it sent to Dr. Despina Hidden or Emelda Fear, I told her they are not in the office. Pt said just send it to whoever is there.

## 2018-12-13 NOTE — Telephone Encounter (Signed)
Pt requesting an increased dose of her klonopin. States that the current dose is not helping her anxiety. Advised I would send message to a provider and let her know as soon as I hear back.

## 2018-12-13 NOTE — Telephone Encounter (Signed)
Per Dr Alysia Penna. No pain medication for this issue, pain is not from epidural given that she delivered in August. Patient should see her pcp or go to PT. Patient informed.

## 2018-12-14 NOTE — Telephone Encounter (Signed)
Called patient back and left message that I am returning her call.  

## 2018-12-14 NOTE — Telephone Encounter (Signed)
We should not be managing long term benzodiazepine therapy that is requiring escalating dosing or longer term use

## 2018-12-14 NOTE — Telephone Encounter (Signed)
Patient called back. I informed her that Dr. Despina Hidden would not be able to adjust her dosage of the klonopin. Patient then asked if she could come to the office today to be seen for her back pain to get pain medicine. I advised that she should call her pcp as she was told yesterday about her back pain complaint. Patient agreeable and had no other questions at this time.

## 2018-12-15 ENCOUNTER — Ambulatory Visit: Payer: Medicaid Other | Admitting: Obstetrics and Gynecology

## 2018-12-15 ENCOUNTER — Other Ambulatory Visit: Payer: Self-pay

## 2018-12-15 ENCOUNTER — Telehealth: Payer: Self-pay | Admitting: *Deleted

## 2018-12-15 ENCOUNTER — Telehealth: Payer: Self-pay | Admitting: Obstetrics and Gynecology

## 2018-12-15 NOTE — Telephone Encounter (Signed)
Pt is requesting an appointment to be seen today. States she is having low back, abdominal and pelvic pain. She states that EMS told her to call us for an appt instead of going to the ER. Please advise.

## 2018-12-15 NOTE — Telephone Encounter (Signed)
Pt in the parking lot requesting an appt to be seen.

## 2018-12-15 NOTE — Telephone Encounter (Signed)
Patient states she called 911 because she was having abdominal pain and sharp vaginal pain as well.  She was told she could come to our office to be seen.  States she is having lower back pain as well but no vaginal discharge.  Wants to be seen.  Informed patient that I see it was documented that she called yesterday with c/o pain and was requesting pain medication and an increase on her klonopin. I informed her that we could see her and do an evaluation but none of our doctors would be giving her any pain medication or increase dosage on klonopin as Dr Emelda Fear was out of town and normally manages her medication. Pt stated that was not what she wanted.  Appt made.    Prescreen questions asked to patient and she stated she has had a severe headache, SOB, muscle pain, abdominal pain and weakness.  I informed patient that based on her symptoms, we could not see her at this time as she is displaying symptoms of COVID-19.  Advised patient to go home and self quarantine for 14 days. Patient verbalized understanding.

## 2019-01-03 ENCOUNTER — Encounter (HOSPITAL_COMMUNITY): Payer: Self-pay | Admitting: *Deleted

## 2019-01-03 ENCOUNTER — Other Ambulatory Visit: Payer: Self-pay

## 2019-01-03 ENCOUNTER — Emergency Department (HOSPITAL_COMMUNITY)
Admission: EM | Admit: 2019-01-03 | Discharge: 2019-01-03 | Disposition: A | Discharge status: Home / Self Care | Payer: Medicaid Other | Attending: Emergency Medicine | Admitting: Emergency Medicine

## 2019-01-03 DIAGNOSIS — F17210 Nicotine dependence, cigarettes, uncomplicated: Secondary | ICD-10-CM | POA: Diagnosis not present

## 2019-01-03 DIAGNOSIS — Y999 Unspecified external cause status: Secondary | ICD-10-CM | POA: Diagnosis not present

## 2019-01-03 DIAGNOSIS — Y929 Unspecified place or not applicable: Secondary | ICD-10-CM | POA: Insufficient documentation

## 2019-01-03 DIAGNOSIS — Z23 Encounter for immunization: Secondary | ICD-10-CM | POA: Diagnosis not present

## 2019-01-03 DIAGNOSIS — Y9389 Activity, other specified: Secondary | ICD-10-CM | POA: Insufficient documentation

## 2019-01-03 DIAGNOSIS — F419 Anxiety disorder, unspecified: Secondary | ICD-10-CM

## 2019-01-03 DIAGNOSIS — W230XXA Caught, crushed, jammed, or pinched between moving objects, initial encounter: Secondary | ICD-10-CM | POA: Insufficient documentation

## 2019-01-03 DIAGNOSIS — Z79899 Other long term (current) drug therapy: Secondary | ICD-10-CM | POA: Diagnosis not present

## 2019-01-03 DIAGNOSIS — F909 Attention-deficit hyperactivity disorder, unspecified type: Secondary | ICD-10-CM | POA: Diagnosis not present

## 2019-01-03 DIAGNOSIS — S91011A Laceration without foreign body, right ankle, initial encounter: Secondary | ICD-10-CM

## 2019-01-03 MED ORDER — OXYCODONE-ACETAMINOPHEN 5-325 MG PO TABS
1.0000 | ORAL_TABLET | Freq: Once | ORAL | Status: AC
  Administered 2019-01-03: 13:00:00 1 via ORAL
  Filled 2019-01-03: qty 1

## 2019-01-03 MED ORDER — TETANUS-DIPHTH-ACELL PERTUSSIS 5-2.5-18.5 LF-MCG/0.5 IM SUSP
0.5000 mL | Freq: Once | INTRAMUSCULAR | Status: AC
  Administered 2019-01-03: 0.5 mL via INTRAMUSCULAR
  Filled 2019-01-03: qty 0.5

## 2019-01-03 NOTE — ED Triage Notes (Signed)
Pt with superficial lac to back of right ankle where storm door hit pt's leg
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
## 2019-01-03 NOTE — Discharge Instructions (Addendum)
Continue using ibuprofen for your right ankle pain. You were given a Tdap vaccination. Continue to apply bacitracin ointment to your wound a couple times a day.    I have provided numbers for behavioral health facilities that I want you to follow up with tomorrow to set up an appointment with to help manage your anxiety.

## 2019-01-05 ENCOUNTER — Other Ambulatory Visit: Payer: Self-pay | Admitting: Obstetrics and Gynecology

## 2019-01-05 ENCOUNTER — Telehealth: Payer: Self-pay | Admitting: Obstetrics and Gynecology

## 2019-01-05 MED ORDER — SULFAMETHOXAZOLE-TRIMETHOPRIM 400-80 MG PO TABS: 1.0000 | ORAL_TABLET | Freq: Two times a day (BID) | ORAL | 0 refills | Status: DC

## 2019-01-05 NOTE — Telephone Encounter (Signed)
Pt will be asked to collect a urine sample to the office. I will send in 3 days of Septra.

## 2019-01-05 NOTE — Progress Notes (Signed)
Septra DS x 3 d. sent to [facility].

## 2019-01-05 NOTE — Telephone Encounter (Signed)
Pt believes she may have a UTI and requesting medication to be sent in.

## 2019-01-05 NOTE — Telephone Encounter (Signed)
klonipin Rx refilled.

## 2019-01-06 NOTE — Telephone Encounter (Signed)
Called paitent back and left message to call us to schedule a lab visit to do a urine culture.

## 2019-01-07 ENCOUNTER — Telehealth: Payer: Self-pay | Admitting: Obstetrics and Gynecology

## 2019-01-07 ENCOUNTER — Ambulatory Visit: Payer: Medicaid Other | Admitting: Obstetrics and Gynecology

## 2019-01-07 MED ORDER — ALPRAZOLAM 0.5 MG PO TABS: 0.5000 mg | ORAL_TABLET | Freq: Two times a day (BID) | ORAL | 0 refills | Status: DC | PRN

## 2019-01-07 NOTE — Telephone Encounter (Signed)
Pt requesting her clonazePAM to be increased. She states the 0.5 doesn't seem to be helping.

## 2019-01-07 NOTE — Telephone Encounter (Signed)
Will switch to xanax 0.5 bid prn x 2 wk. Pt agrees to seek counselling, as I will no longer willing to use continued medications

## 2019-01-29 ENCOUNTER — Other Ambulatory Visit: Payer: Self-pay

## 2019-01-29 ENCOUNTER — Encounter (HOSPITAL_COMMUNITY): Payer: Self-pay | Admitting: Emergency Medicine

## 2019-01-29 ENCOUNTER — Emergency Department (HOSPITAL_COMMUNITY): Payer: Medicaid Other

## 2019-01-29 ENCOUNTER — Emergency Department (HOSPITAL_COMMUNITY)
Admission: EM | Admit: 2019-01-29 | Discharge: 2019-01-29 | Disposition: A | Discharge status: Home / Self Care | Payer: Medicaid Other | Attending: Emergency Medicine | Admitting: Emergency Medicine

## 2019-01-29 DIAGNOSIS — S3982XA Other specified injuries of lower back, initial encounter: Secondary | ICD-10-CM | POA: Diagnosis present

## 2019-01-29 DIAGNOSIS — W19XXXA Unspecified fall, initial encounter: Secondary | ICD-10-CM

## 2019-01-29 DIAGNOSIS — W1789XA Other fall from one level to another, initial encounter: Secondary | ICD-10-CM | POA: Insufficient documentation

## 2019-01-29 DIAGNOSIS — Y999 Unspecified external cause status: Secondary | ICD-10-CM | POA: Insufficient documentation

## 2019-01-29 DIAGNOSIS — Y939 Activity, unspecified: Secondary | ICD-10-CM | POA: Diagnosis not present

## 2019-01-29 DIAGNOSIS — S161XXA Strain of muscle, fascia and tendon at neck level, initial encounter: Secondary | ICD-10-CM | POA: Insufficient documentation

## 2019-01-29 DIAGNOSIS — Y92008 Other place in unspecified non-institutional (private) residence as the place of occurrence of the external cause: Secondary | ICD-10-CM | POA: Diagnosis not present

## 2019-01-29 DIAGNOSIS — S39012A Strain of muscle, fascia and tendon of lower back, initial encounter: Secondary | ICD-10-CM

## 2019-01-29 DIAGNOSIS — S90111A Contusion of right great toe without damage to nail, initial encounter: Secondary | ICD-10-CM

## 2019-01-29 DIAGNOSIS — F17210 Nicotine dependence, cigarettes, uncomplicated: Secondary | ICD-10-CM | POA: Diagnosis not present

## 2019-01-29 LAB — URINALYSIS, ROUTINE W REFLEX MICROSCOPIC
Bilirubin Urine: NEGATIVE
Glucose, UA: NEGATIVE mg/dL
Hgb urine dipstick: NEGATIVE
Ketones, ur: 20 mg/dL — AB
Leukocytes,Ua: NEGATIVE
Nitrite: NEGATIVE
Protein, ur: NEGATIVE mg/dL
Specific Gravity, Urine: 1.029 (ref 1.005–1.030)
pH: 6 (ref 5.0–8.0)

## 2019-01-29 LAB — POC URINE PREG, ED: Preg Test, Ur: NEGATIVE

## 2019-01-29 MED ORDER — OXYCODONE-ACETAMINOPHEN 5-325 MG PO TABS
1.0000 | ORAL_TABLET | Freq: Once | ORAL | Status: AC
  Administered 2019-01-29: 1 via ORAL
  Filled 2019-01-29: qty 1

## 2019-01-29 MED ORDER — HYDROCODONE-ACETAMINOPHEN 5-325 MG PO TABS: ORAL_TABLET | ORAL | 0 refills | Status: DC

## 2019-01-29 MED ORDER — IBUPROFEN 600 MG PO TABS: 600.0000 mg | ORAL_TABLET | Freq: Four times a day (QID) | ORAL | 0 refills | Status: DC | PRN
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
## 2019-01-29 NOTE — Discharge Instructions (Addendum)
Apply ice packs on/off to your neck and back.  Follow-up with your primary provider next week if needed.

## 2019-01-29 NOTE — ED Triage Notes (Signed)
Pt states that she had a panic attack this morning that caused her to fall off her porch. Pt c/o pain to back and neck. Pt ambulatory. NAD noted at this time.

## 2019-01-31 ENCOUNTER — Telehealth: Payer: Self-pay | Admitting: Obstetrics and Gynecology

## 2019-01-31 NOTE — Telephone Encounter (Signed)
Patient called stating that she would like for Dr. Glo Herring to call her in a refill of her Clonazepam. I let patient know that Dr. Glo Herring wont be in until tomorrow. Please contact pt

## 2019-02-02 ENCOUNTER — Telehealth: Payer: Self-pay | Admitting: Obstetrics and Gynecology

## 2019-02-02 ENCOUNTER — Other Ambulatory Visit: Payer: Self-pay | Admitting: Obstetrics and Gynecology

## 2019-02-02 MED ORDER — CLONAZEPAM 0.5 MG PO TBDP: 0.5000 mg | ORAL_TABLET | Freq: Two times a day (BID) | ORAL | 1 refills | Status: DC | PRN

## 2019-02-02 NOTE — Telephone Encounter (Signed)
Pt states if this can be resent before 4 or 5pm she would like her clonazePAM (KLONOPIN) 0.5 MG disintegrating tablet  [facility] [address] [facility] [place].

## 2019-02-02 NOTE — Telephone Encounter (Signed)
Pt wanted Klonopin sent to different pharmacy because she went fishing. Pt wanted it transferred before 4:00 or 5:00. Pt advised Dr. Glo Herring is still seeing pt's and it will be after he's done seeing pt's. Pt decided to leave as is. [place]

## 2019-02-02 NOTE — Telephone Encounter (Signed)
refil Klonipin e-scribed.  Pt will need tele-visit for future care. We must return to more visits instead of just calling in Rx's especially for long-term pts.

## 2019-02-02 NOTE — Telephone Encounter (Signed)
Pt aware of Dr. Ferguson's recommendations and voiced understanding. JSY 

## 2019-02-02 NOTE — Progress Notes (Signed)
Pt to be given refil of Klonipin 0.5 1-2 x daily. Pt needs tele-visit for any further refils.

## 2019-02-02 NOTE — Telephone Encounter (Signed)
Pt reminded that her appt is a webex appt tomorrow and no need to come into the office.

## 2019-02-02 NOTE — Telephone Encounter (Signed)
Patient called, wants to speak to a nurse before Dr. Pia Mau today, about her prescription.  [phone]

## 2019-02-02 NOTE — Telephone Encounter (Signed)
Voice mail not set up @ 5:20 pm. JSY

## 2019-02-03 ENCOUNTER — Telehealth: Payer: Self-pay | Admitting: Obstetrics and Gynecology

## 2019-02-03 ENCOUNTER — Ambulatory Visit (INDEPENDENT_AMBULATORY_CARE_PROVIDER_SITE_OTHER): Payer: Medicaid Other | Admitting: Obstetrics and Gynecology

## 2019-02-03 ENCOUNTER — Other Ambulatory Visit: Payer: Self-pay

## 2019-02-03 ENCOUNTER — Encounter: Payer: Self-pay | Admitting: Obstetrics and Gynecology

## 2019-02-03 VITALS — Ht 62.0 in | Wt 119.0 lb

## 2019-02-03 DIAGNOSIS — F1320 Sedative, hypnotic or anxiolytic dependence, uncomplicated: Secondary | ICD-10-CM | POA: Diagnosis not present

## 2019-02-03 NOTE — Telephone Encounter (Signed)
Pt had a televisit on 02/03/19. Encounter closed. Jessica [name]

## 2019-02-03 NOTE — Telephone Encounter (Signed)
Patient called, stated that [facility] told her Dr. Glo Herring closed her medication.  She wants to know what's going on and wants to know before she leaves [place].  [employer]  [phone]

## 2019-02-03 NOTE — Telephone Encounter (Signed)
Spoke with pt. I let pt know Dr. Glo Herring stopped her prescription because she has been using 2 pharmacies. Dr. Glo Herring got a call from [facility] letting him know she is using 2 pharmacies. Pt states she didn't know she couldn't use 2 pharmacies. [place]

## 2019-02-03 NOTE — Telephone Encounter (Signed)
Ms. single was contacted by phone to tell her of my awareness that she has been using 2 pharmacies to double up on her medicines.  I informed her that I will not under any circumstances be filling her anxiety medicines of either type  moving forward.  I informed her that I am done with this manipulation
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
## 2019-02-22 ENCOUNTER — Telehealth: Payer: Self-pay | Admitting: Obstetrics and Gynecology

## 2019-02-22 NOTE — Telephone Encounter (Signed)
Needs to have Dr Glo Herring call her in ref to getting a counselor .

## 2019-02-23 ENCOUNTER — Ambulatory Visit: Payer: Medicaid Other | Admitting: Obstetrics and Gynecology

## 2019-02-23 ENCOUNTER — Telehealth: Payer: Self-pay | Admitting: Obstetrics and Gynecology

## 2019-02-23 NOTE — Telephone Encounter (Signed)
Pt scheduled for tele visit per Dr. Glo Herring to discuss referral.

## 2019-02-23 NOTE — Telephone Encounter (Signed)
Patient called and given info on local Mental Health options, [facility], [facility] and [facility],

## 2019-03-09 ENCOUNTER — Ambulatory Visit (INDEPENDENT_AMBULATORY_CARE_PROVIDER_SITE_OTHER): Payer: Medicaid Other | Admitting: Obstetrics and Gynecology

## 2019-03-09 ENCOUNTER — Other Ambulatory Visit: Payer: Self-pay

## 2019-03-09 ENCOUNTER — Encounter: Payer: Self-pay | Admitting: Obstetrics and Gynecology

## 2019-03-09 VITALS — Ht 62.0 in | Wt 120.0 lb

## 2019-03-09 DIAGNOSIS — F4320 Adjustment disorder, unspecified: Secondary | ICD-10-CM
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
## 2019-04-07 ENCOUNTER — Emergency Department (HOSPITAL_COMMUNITY)
Admission: EM | Admit: 2019-04-07 | Discharge: 2019-04-07 | Disposition: A | Discharge status: Home / Self Care | Payer: Medicaid Other | Attending: Emergency Medicine | Admitting: Emergency Medicine

## 2019-04-07 ENCOUNTER — Encounter (HOSPITAL_COMMUNITY): Payer: Self-pay | Admitting: Emergency Medicine

## 2019-04-07 ENCOUNTER — Other Ambulatory Visit: Payer: Self-pay

## 2019-04-07 DIAGNOSIS — G43001 Migraine without aura, not intractable, with status migrainosus: Secondary | ICD-10-CM | POA: Diagnosis not present

## 2019-04-07 DIAGNOSIS — F17210 Nicotine dependence, cigarettes, uncomplicated: Secondary | ICD-10-CM | POA: Insufficient documentation

## 2019-04-07 DIAGNOSIS — G43909 Migraine, unspecified, not intractable, without status migrainosus: Secondary | ICD-10-CM | POA: Diagnosis present

## 2019-04-07 MED ORDER — KETOROLAC TROMETHAMINE 30 MG/ML IJ SOLN
30.0000 mg | Freq: Once | INTRAMUSCULAR | Status: AC
  Administered 2019-04-07: 30 mg via INTRAMUSCULAR
  Filled 2019-04-07: qty 1

## 2019-04-07 MED ORDER — METOCLOPRAMIDE HCL 10 MG PO TABS
10.0000 mg | ORAL_TABLET | Freq: Once | ORAL | Status: DC
  Filled 2019-04-07: qty 1

## 2019-04-07 MED ORDER — DIPHENHYDRAMINE HCL 25 MG PO CAPS
25.0000 mg | ORAL_CAPSULE | Freq: Once | ORAL | Status: DC
  Filled 2019-04-07: qty 1

## 2019-04-07 NOTE — ED Notes (Addendum)
Pt called nurse into room and said she was having a panic attack. This nurse monitored pt and noticed no increase work of breathing. Told pt to take slow and deep breaths. Pt said she has a history of them, but doesn't take any medications for them. Notified PA. PA went in to examine patient.

## 2019-04-07 NOTE — Discharge Instructions (Signed)
You may obtain medication for your panic attacks from your primary care physician  You may take Tylenol to help with your headaches along with increased fluids and plenty of rest.
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
## 2019-04-07 NOTE — ED Notes (Signed)
Pt says her head still hurts.

## 2019-04-07 NOTE — ED Triage Notes (Signed)
Patient reports migraines for 2 days, no emesis. Patient also c/o sharp pains in her arms and legs.

## 2019-04-07 NOTE — ED Notes (Signed)
Computer shut down while pt signing out. Verbalized understanding

## 2019-05-31 ENCOUNTER — Encounter (HOSPITAL_COMMUNITY): Payer: Self-pay | Admitting: *Deleted

## 2019-05-31 ENCOUNTER — Emergency Department (HOSPITAL_COMMUNITY)
Admission: EM | Admit: 2019-05-31 | Discharge: 2019-06-01 | Disposition: A | Discharge status: Home / Self Care | Payer: Medicaid Other | Attending: Emergency Medicine | Admitting: Emergency Medicine

## 2019-05-31 ENCOUNTER — Other Ambulatory Visit: Payer: Self-pay

## 2019-05-31 DIAGNOSIS — Z5321 Procedure and treatment not carried out due to patient leaving prior to being seen by health care provider: Secondary | ICD-10-CM | POA: Insufficient documentation

## 2019-05-31 DIAGNOSIS — R1031 Right lower quadrant pain: Secondary | ICD-10-CM | POA: Diagnosis present

## 2019-05-31 NOTE — ED Triage Notes (Signed)
Patient states she "passed out for 5 minutes today" approximately 0930 this morning after feeling so much pain.

## 2019-05-31 NOTE — ED Triage Notes (Signed)
Patient comes to the ED with right flank pain for one week.  Patient states she has passed a kidney stone one week ago.

## 2019-06-09 ENCOUNTER — Emergency Department (HOSPITAL_COMMUNITY): Payer: Medicaid Other

## 2019-06-09 ENCOUNTER — Emergency Department (HOSPITAL_COMMUNITY)
Admission: EM | Admit: 2019-06-09 | Discharge: 2019-06-09 | Disposition: A | Discharge status: Home / Self Care | Payer: Medicaid Other | Attending: Emergency Medicine | Admitting: Emergency Medicine

## 2019-06-09 ENCOUNTER — Other Ambulatory Visit: Payer: Self-pay

## 2019-06-09 ENCOUNTER — Encounter (HOSPITAL_COMMUNITY): Payer: Self-pay | Admitting: *Deleted

## 2019-06-09 DIAGNOSIS — R0789 Other chest pain: Secondary | ICD-10-CM | POA: Diagnosis present

## 2019-06-09 DIAGNOSIS — F17210 Nicotine dependence, cigarettes, uncomplicated: Secondary | ICD-10-CM | POA: Insufficient documentation

## 2019-06-09 DIAGNOSIS — Z3202 Encounter for pregnancy test, result negative: Secondary | ICD-10-CM | POA: Insufficient documentation

## 2019-06-09 DIAGNOSIS — R339 Retention of urine, unspecified: Secondary | ICD-10-CM | POA: Insufficient documentation

## 2019-06-09 LAB — CBC WITH DIFFERENTIAL/PLATELET
Abs Immature Granulocytes: 0.02 10*3/uL (ref 0.00–0.07)
Basophils Absolute: 0 10*3/uL (ref 0.0–0.1)
Basophils Relative: 1 %
Eosinophils Absolute: 0 10*3/uL (ref 0.0–0.5)
Eosinophils Relative: 1 %
HCT: 44.1 % (ref 36.0–46.0)
Hemoglobin: 14.8 g/dL (ref 12.0–15.0)
Immature Granulocytes: 0 %
Lymphocytes Relative: 42 %
Lymphs Abs: 2 10*3/uL (ref 0.7–4.0)
MCH: 30 pg (ref 26.0–34.0)
MCHC: 33.6 g/dL (ref 30.0–36.0)
MCV: 89.3 fL (ref 80.0–100.0)
Monocytes Absolute: 0.2 10*3/uL (ref 0.1–1.0)
Monocytes Relative: 5 %
Neutro Abs: 2.5 10*3/uL (ref 1.7–7.7)
Neutrophils Relative %: 51 %
Platelets: 232 10*3/uL (ref 150–400)
RBC: 4.94 MIL/uL (ref 3.87–5.11)
RDW: 11.9 % (ref 11.5–15.5)
WBC: 4.8 10*3/uL (ref 4.0–10.5)
nRBC: 0 % (ref 0.0–0.2)

## 2019-06-09 LAB — URINALYSIS, ROUTINE W REFLEX MICROSCOPIC
Bilirubin Urine: NEGATIVE
Glucose, UA: NEGATIVE mg/dL
Hgb urine dipstick: NEGATIVE
Ketones, ur: NEGATIVE mg/dL
Leukocytes,Ua: NEGATIVE
Nitrite: NEGATIVE
Protein, ur: NEGATIVE mg/dL
Specific Gravity, Urine: 1.021 (ref 1.005–1.030)
pH: 7 (ref 5.0–8.0)

## 2019-06-09 LAB — COMPREHENSIVE METABOLIC PANEL
ALT: 15 U/L (ref 0–44)
AST: 17 U/L (ref 15–41)
Albumin: 4.5 g/dL (ref 3.5–5.0)
Alkaline Phosphatase: 42 U/L (ref 38–126)
Anion gap: 10 (ref 5–15)
BUN: 20 mg/dL (ref 6–20)
CO2: 22 mmol/L (ref 22–32)
Calcium: 9.1 mg/dL (ref 8.9–10.3)
Chloride: 104 mmol/L (ref 98–111)
Creatinine, Ser: 0.71 mg/dL (ref 0.44–1.00)
GFR calc Af Amer: >60 mL/min (ref >60)
GFR calc non Af Amer: >60 mL/min (ref >60)
Glucose, Bld: 79 mg/dL (ref 70–99)
Potassium: 3.6 mmol/L (ref 3.5–5.1)
Sodium: 136 mmol/L (ref 135–145)
Total Bilirubin: 0.9 mg/dL (ref 0.3–1.2)
Total Protein: 7.7 g/dL (ref 6.5–8.1)

## 2019-06-09 LAB — LIPASE, BLOOD: Lipase: 35 U/L (ref 11–51)

## 2019-06-09 LAB — PREGNANCY, URINE: Preg Test, Ur: NEGATIVE

## 2019-06-09 MED ORDER — SODIUM CHLORIDE 0.9 % IV BOLUS
1000.0000 mL | Freq: Once | INTRAVENOUS | Status: AC
  Administered 2019-06-09: 1000 mL via INTRAVENOUS

## 2019-06-09 MED ORDER — ACETAMINOPHEN 325 MG PO TABS
650.0000 mg | ORAL_TABLET | Freq: Once | ORAL | Status: AC
  Administered 2019-06-09: 650 mg via ORAL
  Filled 2019-06-09: qty 2

## 2019-06-09 MED ORDER — HYDROXYZINE HCL 25 MG PO TABS
25.0000 mg | ORAL_TABLET | Freq: Once | ORAL | Status: DC
  Filled 2019-06-09: qty 1
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
## 2019-06-09 NOTE — ED Triage Notes (Signed)
C/o chest pain onset last night, also states she has not urinated since last night

## 2019-06-09 NOTE — Discharge Instructions (Addendum)
Your lab work today is reassuring.  Follow-up with your primary care provider.

## 2019-07-05 ENCOUNTER — Ambulatory Visit: Payer: Medicaid Other | Admitting: Obstetrics and Gynecology

## 2019-07-05 ENCOUNTER — Encounter: Payer: Self-pay | Admitting: Obstetrics and Gynecology

## 2019-07-05 ENCOUNTER — Ambulatory Visit (INDEPENDENT_AMBULATORY_CARE_PROVIDER_SITE_OTHER): Payer: Medicaid Other | Admitting: Obstetrics and Gynecology

## 2019-07-05 ENCOUNTER — Other Ambulatory Visit: Payer: Self-pay

## 2019-07-05 VITALS — BP 133/80 | HR 88 | Ht 62.0 in | Wt 129.0 lb

## 2019-07-05 DIAGNOSIS — F430 Acute stress reaction: Secondary | ICD-10-CM

## 2019-07-05 MED ORDER — CLONAZEPAM 0.5 MG PO TABS: 0.5000 mg | ORAL_TABLET | Freq: Two times a day (BID) | ORAL | 0 refills | Status: DC

## 2019-07-05 NOTE — Progress Notes (Addendum)
Patient ID: [name], female   DOB: October 03, 1990, 28 y.o.   MRN: [id number]   [facility] Clinic Visit  @DATE @            Patient name: Jessica [name] MRN [name]  Date of birth: Dec 24, 1990  CC & HPI:  Jessica [name] is a 28 y.o. female presenting today for discussion of depression. Bad panic attacks and depression. Mother has recently passed and grandfather has been ill. Mother would use cocaine and died at 39 after aneurysm is brain burst. She says she hasn't been able to sleep, children are doing well but she gets aggravated with children and will leave when husband is there to watch the kids so she can take a break. Transportation issue has been fixed. She is no longer having relationship/marital issues. She has trouble breathing and wants to scream and cry with difficulty to calm down. She has no suicidal or homicidal tendencies. She has no case worker to help her. Her father died when she was 9, she barely knew him. Says lexapro didn't help her but klonopin did work well. She says she needs "something to get her through the holidays". "She needs something to help her out".  ROS:  ROS +anxiety  Pertinent History Reviewed:   Reviewed: Medical         Past Medical History:  Diagnosis Date  . Abnormal Pap smear   . ADHD (attention deficit hyperactivity disorder)   . Anxiety   . Chronic back pain   . Depression   . Migraine headache   . UTI (lower urinary tract infection) 01/12/2013  . Vaginal Pap smear, abnormal                               Surgical Hx:    Past Surgical History:  Procedure Laterality Date  . CESAREAN SECTION N/A 09/08/2013   Procedure: CESAREAN SECTION;  Surgeon: 09/10/2013, MD;  Location: [facility];  Service: Obstetrics;  Laterality: N/A;  . CESAREAN SECTION N/A 02/10/2018   Procedure: REPEAT CESAREAN SECTION;  Surgeon: 04/13/2018, MD;  Location: [facility];  Service: Obstetrics;  Laterality: N/A;  . TOOTH EXTRACTION    . TUBAL  LIGATION Bilateral 04/08/2018   Procedure: LAPAROSCOPIC BILATERAL TUBAL STERILIZATION WITH FALLOPE RING APPLICATION;  Surgeon: 04/10/2018, MD;  Location: [facility];  Service: Gynecology;  Laterality: Bilateral;   Medications: Reviewed & Updated - see associated section                       Current Outpatient Medications:  .  clonazePAM (KLONOPIN) 0.5 MG disintegrating tablet, Take 1 tablet (0.5 mg total) by mouth 2 (two) times daily as needed. (Patient not taking: Reported on 03/09/2019), Disp: 30 tablet, Rfl: 1   Social History: Reviewed -  reports that she has been smoking cigarettes. She has been smoking about 1.00 pack per day. She has never used smokeless tobacco.  Objective Findings:  Vitals: Blood pressure 133/80, pulse 88, height 5\' 2"  (1.575 m), weight 129 lb (58.5 kg), last menstrual period 06/28/2019, not currently breastfeeding.  PHYSICAL EXAMINATION General appearance - alert, well appearing, and in no distress Mental status - normal mood, behavior, speech, dress, motor activity, and thought processes, affect appropriate to mood  PELVIC Not Done  Assessment & Plan:   A:  1.  Depression counseling   P:  1. Counseling recommendation  2. Referral to Counsellor, pt to supply preferred referral provider. 3. Rx Klonopin  0.5 mg BID 10 days 4. F/u 2 weeks    By signing my name below, I, Samul Dada, attest that this documentation has been prepared under the direction and in the presence of Jonnie Kind, MD. Electronically Signed: [place]. 07/05/19. 2:20 PM.  I personally performed the services described in this documentation, which was SCRIBED in my presence. The recorded information has been reviewed and considered accurate. It has been edited as necessary during review. Jonnie Kind, MD

## 2019-07-13 ENCOUNTER — Emergency Department: Payer: Medicaid Other

## 2019-07-13 ENCOUNTER — Other Ambulatory Visit: Payer: Self-pay

## 2019-07-13 ENCOUNTER — Encounter: Payer: Self-pay | Admitting: Emergency Medicine

## 2019-07-13 ENCOUNTER — Emergency Department
Admission: EM | Admit: 2019-07-13 | Discharge: 2019-07-13 | Disposition: A | Discharge status: Home / Self Care | Payer: Medicaid Other | Attending: Emergency Medicine | Admitting: Emergency Medicine

## 2019-07-13 DIAGNOSIS — F17210 Nicotine dependence, cigarettes, uncomplicated: Secondary | ICD-10-CM | POA: Insufficient documentation

## 2019-07-13 DIAGNOSIS — M4300 Spondylolysis, site unspecified: Secondary | ICD-10-CM | POA: Diagnosis not present

## 2019-07-13 DIAGNOSIS — Z79899 Other long term (current) drug therapy: Secondary | ICD-10-CM | POA: Diagnosis not present

## 2019-07-13 DIAGNOSIS — R55 Syncope and collapse: Secondary | ICD-10-CM | POA: Insufficient documentation

## 2019-07-13 LAB — URINALYSIS, COMPLETE (UACMP) WITH MICROSCOPIC
Bacteria, UA: NONE SEEN
Bilirubin Urine: NEGATIVE
Glucose, UA: NEGATIVE mg/dL
Hgb urine dipstick: NEGATIVE
Ketones, ur: NEGATIVE mg/dL
Leukocytes,Ua: NEGATIVE
Nitrite: NEGATIVE
Protein, ur: NEGATIVE mg/dL
Specific Gravity, Urine: 1.01 (ref 1.005–1.030)
WBC, UA: NONE SEEN WBC/hpf (ref 0–5)
pH: 8 (ref 5.0–8.0)

## 2019-07-13 LAB — CBC
HCT: 39.9 % (ref 36.0–46.0)
Hemoglobin: 14.2 g/dL (ref 12.0–15.0)
MCH: 29.4 pg (ref 26.0–34.0)
MCHC: 35.6 g/dL (ref 30.0–36.0)
MCV: 82.6 fL (ref 80.0–100.0)
Platelets: 268 10*3/uL (ref 150–400)
RBC: 4.83 MIL/uL (ref 3.87–5.11)
RDW: 12.1 % (ref 11.5–15.5)
WBC: 7.4 10*3/uL (ref 4.0–10.5)
nRBC: 0 % (ref 0.0–0.2)

## 2019-07-13 LAB — GLUCOSE, CAPILLARY: Glucose-Capillary: 87 mg/dL (ref 70–99)

## 2019-07-13 LAB — BASIC METABOLIC PANEL
Anion gap: 7 (ref 5–15)
BUN: 18 mg/dL (ref 6–20)
CO2: 23 mmol/L (ref 22–32)
Calcium: 9.4 mg/dL (ref 8.9–10.3)
Chloride: 107 mmol/L (ref 98–111)
Creatinine, Ser: 0.78 mg/dL (ref 0.44–1.00)
GFR calc Af Amer: >60 mL/min (ref >60)
GFR calc non Af Amer: >60 mL/min (ref >60)
Glucose, Bld: 92 mg/dL (ref 70–99)
Potassium: 3.8 mmol/L (ref 3.5–5.1)
Sodium: 137 mmol/L (ref 135–145)

## 2019-07-13 LAB — LIPASE, BLOOD: Lipase: 28 U/L (ref 11–51)

## 2019-07-13 LAB — POC URINE PREG, ED: Preg Test, Ur: NEGATIVE

## 2019-07-13 MED ORDER — ONDANSETRON 4 MG PO TBDP: 4.0000 mg | ORAL_TABLET | Freq: Three times a day (TID) | ORAL | 0 refills | Status: DC | PRN

## 2019-07-13 MED ORDER — NAPROXEN 500 MG PO TABS: 500.0000 mg | ORAL_TABLET | Freq: Two times a day (BID) | ORAL | 0 refills | Status: AC | PRN

## 2019-07-13 MED ORDER — HYDROCODONE-ACETAMINOPHEN 5-325 MG PO TABS: 1.0000 | ORAL_TABLET | Freq: Four times a day (QID) | ORAL | 0 refills | Status: DC | PRN

## 2019-07-13 MED ORDER — HYDROCODONE-ACETAMINOPHEN 5-325 MG PO TABS
2.0000 | ORAL_TABLET | Freq: Once | ORAL | Status: AC
  Administered 2019-07-13: 2 via ORAL
  Filled 2019-07-13: qty 2

## 2019-07-13 NOTE — Discharge Instructions (Addendum)
Call one of the primary numbers above to set up an appointment with a new doctor to discuss your syncopal episode. You can also call your Medicaid/insurance provider.  I'd recommend considering an outpatient cardiac monitor to evaluate for possible arrhythmia  Do not drive until cleared by a doctor  Drink at least 6-8 glasses of water daily

## 2019-07-13 NOTE — ED Triage Notes (Signed)
Pt in via [name]; reports syncopal episode at home, hitting head on wood floor.  Pt also with complaints of abdominal pain and shortness of breath since episode.  Denies N/V/D.  Vitals WDL, NAD noted at this time.
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
## 2019-07-13 NOTE — ED Triage Notes (Addendum)
First RN note: Pt comes via [facility] with c/o syncopal episode. EMS reports pt had episode and was then confused.  VSS. Pt also c/o abdominal pain and back pain.  Pt A&OX4.

## 2019-07-13 NOTE — ED Notes (Signed)
Patient transported to CT 

## 2019-07-19 ENCOUNTER — Telehealth: Payer: Self-pay | Admitting: Obstetrics and Gynecology

## 2019-07-19 ENCOUNTER — Ambulatory Visit (INDEPENDENT_AMBULATORY_CARE_PROVIDER_SITE_OTHER): Payer: Medicaid Other | Admitting: Obstetrics and Gynecology

## 2019-07-19 ENCOUNTER — Other Ambulatory Visit: Payer: Self-pay

## 2019-07-19 ENCOUNTER — Encounter: Payer: Self-pay | Admitting: Obstetrics and Gynecology

## 2019-07-19 VITALS — BP 133/79 | HR 76 | Ht 62.0 in | Wt 129.8 lb

## 2019-07-19 DIAGNOSIS — F430 Acute stress reaction: Secondary | ICD-10-CM | POA: Diagnosis not present

## 2019-07-19 NOTE — Progress Notes (Addendum)
Patient ID: Jessica [name], female   DOB: [birth year], 28 y.o.   MRN: [id number]    [place] [facility] Visit  @DATE @            Patient name: Jessica [name] MRN [id number]  Date of birth: May 22, 1991  CC & HPI:  Jessica [name] is a 28 y.o. female presenting today for depression counseling. At her last appointment on 07/05/2019, she was prescribed Rx Klonopin 0.5 mg BID for 10 days. Today, she reports that the Klonopin is helping but she feels like she needs a lower dose. The Klonopin helps her sleep at night and calms her mind. She says that "Thanksgiving was hard for her" because her mother passed away recently and her grandmother is about to go into the hospital. She has not seen a therapist in a long time.  She went to the ED on 07/13/2019 because she passed out due to hydration. The patient denies fever, chills or any other symptoms or complaints at this time.   Patient's description of the events are selective, and incomplete.  On further questioning she acknowledges that her her mother was a drug addict and unreliable around holidays sometimes caring and sometimes abandoned the patient.  She acknowledges that the holidays were both occasionally caring and occasionally terribly unstable  ROS:  ROS + anxiety - fever - chills All systems are negative except as noted in the HPI and PMH.   Pertinent History Reviewed:   Reviewed: Medical         Past Medical History:  Diagnosis Date  . Abnormal Pap smear   . ADHD (attention deficit hyperactivity disorder)   . Anxiety   . Chronic back pain   . Depression   . Migraine headache   . UTI (lower urinary tract infection) 01/12/2013  . Vaginal Pap smear, abnormal                               Surgical Hx:    Past Surgical History:  Procedure Laterality Date  . CESAREAN SECTION N/A 09/08/2013   Procedure: CESAREAN SECTION;  Surgeon: Osborne Oman, MD;  Location: [place] [facility];  Service: Obstetrics;  Laterality: N/A;  . CESAREAN SECTION N/A  02/10/2018   Procedure: REPEAT CESAREAN SECTION;  Surgeon: Caren Macadam, MD;  Location: [place];  Service: Obstetrics;  Laterality: N/A;  . TOOTH EXTRACTION    . TUBAL LIGATION Bilateral 04/08/2018   Procedure: LAPAROSCOPIC BILATERAL TUBAL STERILIZATION WITH FALLOPE RING APPLICATION;  Surgeon: Jonnie Kind, MD;  Location: [facility];  Service: Gynecology;  Laterality: Bilateral;   Medications: Reviewed & Updated - see associated section                       Current Outpatient Medications:  .  clonazePAM (KLONOPIN) 0.5 MG tablet, Take 1 tablet (0.5 mg total) by mouth 2 (two) times daily., Disp: 20 tablet, Rfl: 0 .  HYDROcodone-acetaminophen (NORCO/VICODIN) 5-325 MG tablet, Take 1 tablet by mouth every 6 (six) hours as needed for moderate pain or severe pain. (Patient not taking: Reported on 07/19/2019), Disp: 8 tablet, Rfl: 0 .  naproxen (NAPROSYN) 500 MG tablet, Take 1 tablet (500 mg total) by mouth 2 (two) times daily as needed for up to 7 days for moderate pain. (Patient not taking: Reported on 07/19/2019), Disp: 14 tablet, Rfl: 0 .  ondansetron (ZOFRAN ODT) 4 MG disintegrating tablet, Take  1 tablet (4 mg total) by mouth every 8 (eight) hours as needed for nausea or vomiting. (Patient not taking: Reported on 07/19/2019), Disp: 15 tablet, Rfl: 0   Social History: Reviewed -  reports that she has been smoking cigarettes. She has been smoking about 1.00 pack per day. She has never used smokeless tobacco.  Objective Findings:  Vitals: Blood pressure 133/79, pulse 76, height 5\' 2"  (1.575 m), weight 129 lb 12.8 oz (58.9 kg), last menstrual period 06/28/2019, not currently breastfeeding.  PHYSICAL EXAMINATION General appearance - oriented to person, place, and time and normal appearing weight Mental status - alert, oriented to person, place, and time, normal mood, behavior, speech, dress, motor activity, and thought processes, affect appropriate to  mood  PELVIC DEFERRED  Assessment & Plan:   A:  1. Depression counseling  P:  1. Did not refill Klonopin today. Pt seems to be making progress. Advised various grief management techniques such as journaling. Discussed that medications alone are just a "band-aid" and that the pt would likely benefit from counseling/narcotics anonymous. 2. F/U in 2 weeks by phone.  The patient is advised that if she will send a updated status in 1 week through my chart app we may consider medications further  By signing my name below, I, 06/30/2019, attest that this documentation has been prepared under the direction and in the presence of Pietro Cassis, MD. Electronically Signed: Tilda Burrow, Medical Scribe. 07/19/19. 4:05 PM.  I personally performed the services described in this documentation, which was SCRIBED in my presence. The recorded information has been reviewed and considered accurate. It has been edited as necessary during review. 14/08/20, MD The provider spent over 15 minutes with the visit, including pre visit review, documentation, with >than 50% spent in counseling and coordination of care.

## 2019-07-19 NOTE — Telephone Encounter (Signed)

## 2019-07-20 ENCOUNTER — Ambulatory Visit: Payer: Medicaid Other | Admitting: Obstetrics and Gynecology

## 2019-08-02 ENCOUNTER — Ambulatory Visit: Payer: Medicaid Other | Admitting: Obstetrics and Gynecology

## 2019-08-02 ENCOUNTER — Telehealth: Payer: Self-pay | Admitting: Obstetrics and Gynecology

## 2019-08-02 NOTE — Telephone Encounter (Signed)
Called patient regarding appointment and the following message was left:   We have you scheduled for an upcoming appointment at our office. At this time, we are still not allowing visitors or children during the appointment, however, a support person, over age 26, may accompany you to your appointment if assistance is needed for safety or care concerns. Otherwise, support persons should remain outside until the visit is complete.   We ask if you have had any exposure to anyone suspected or confirmed of having COVID-19, are awaiting test results for COVID-19 or if you are experiencing any of the following, to call and reschedule your appointment: fever, cough, shortness of breath, muscle pain, diarrhea, rash, vomiting, abdominal pain, red eye, weakness, bruising, bleeding, joint pain, or a severe headache.   Please know we will ask you these questions or similar questions when you arrive for your appointment and again it's how we are keeping everyone safe.    Also,to keep you safe, please use the provided hand sanitizer when you enter the office. We are asking everyone in the office to wear a mask to help prevent the spread of germs. If you have a mask of your own, please wear it to your appointment, if not, we are happy to provide one for you.  Thank you for understanding and your cooperation.    [facility] Staff      

## 2019-08-03 ENCOUNTER — Other Ambulatory Visit: Payer: Self-pay | Admitting: Obstetrics and Gynecology

## 2019-08-03 ENCOUNTER — Other Ambulatory Visit: Payer: Self-pay

## 2019-08-03 ENCOUNTER — Telehealth (INDEPENDENT_AMBULATORY_CARE_PROVIDER_SITE_OTHER): Payer: Medicaid Other | Admitting: Obstetrics and Gynecology

## 2019-08-03 ENCOUNTER — Telehealth: Payer: Self-pay | Admitting: Obstetrics and Gynecology

## 2019-08-03 ENCOUNTER — Encounter: Payer: Self-pay | Admitting: Obstetrics and Gynecology

## 2019-08-03 VITALS — Ht 62.0 in

## 2019-08-03 DIAGNOSIS — F418 Other specified anxiety disorders: Secondary | ICD-10-CM | POA: Diagnosis not present

## 2019-08-03 MED ORDER — LORAZEPAM 0.5 MG PO TABS: 0.5000 mg | ORAL_TABLET | Freq: Two times a day (BID) | ORAL | 0 refills | Status: DC | PRN

## 2019-08-03 NOTE — Progress Notes (Signed)
Patient ID: Jessica [name], female   DOB: 10-Apr-1991, 28 y.o.   MRN: [id number]    TELEHEALTH VIRTUAL GYNECOLOGY VISIT ENCOUNTER NOTE  I connected with Grant Fontana on 08/03/2019 at  9:10 AM EST by telephone at home and verified that I am speaking with the correct person using two identifiers.   I discussed the limitations, risks, security and privacy concerns of performing an evaluation and management service by telephone and the availability of in person appointments. I also discussed with the patient that there may be a patient responsible charge related to this service. The patient expressed understanding and agreed to proceed.   History:  Jessica [name] is a 28 y.o. [phone] female being evaluated today for f/u of depression counseling and refill of Klonopin. She denies any abnormal vaginal discharge, bleeding, pelvic pain or other concerns.      Mliss Fritz continues to speak of difficulties dealing with her family dynamics and family losses.  This year she has had to death that were significant to her.  The first was her mother in April, November 11, 2018,.  Her mother was a complicated factor in her life due to drug use and was only intermittently available to the patient, though the patient finds her mother to be a significant factor in her life.  Currently she is dealing with the fact that the mother's ashes are to be returned to her from a relative who was in charge of the funeral and cremation.  The patient plans to receive these tomorrow as a part of a family get together.  She wishes to avoid receiving them in a public place or other people around.  I suggested to the patient that perhaps she would be better off to postpone this rather than complicate the holidays by this receiving of the ashes. She gets silent.  I suspect she will go ahead and receive the ashes.  Additionally should she lost her grandfather this summer and she perceived him as a more positive factor in her life.  She  reports that the family is being sure that she gets "nothing" from her grandfather's estate.  She is under the perception that he was going to be leaving her some resources, or items of value.  The patient is very vague in her understanding of the estate settlement process  I have made it clear the patient that I do not think that long-term use of anxiolytics are the best answer.  She has in the past gone to counseling, none recently.  Patient is encouraged to return to counseling   Past Medical History:  Diagnosis Date  . Abnormal Pap smear   . ADHD (attention deficit hyperactivity disorder)   . Anxiety   . Chronic back pain   . Depression   . Migraine headache   . UTI (lower urinary tract infection) 01/12/2013  . Vaginal Pap smear, abnormal    Past Surgical History:  Procedure Laterality Date  . CESAREAN SECTION N/A 09/08/2013   Procedure: CESAREAN SECTION;  Surgeon: Osborne Oman, MD;  Location: [facility];  Service: Obstetrics;  Laterality: N/A;  . CESAREAN SECTION N/A 02/10/2018   Procedure: REPEAT CESAREAN SECTION;  Surgeon: Caren Macadam, MD;  Location: [facility];  Service: Obstetrics;  Laterality: N/A;  . TOOTH EXTRACTION    . TUBAL LIGATION Bilateral 04/08/2018   Procedure: LAPAROSCOPIC BILATERAL TUBAL STERILIZATION WITH FALLOPE RING APPLICATION;  Surgeon: Jonnie Kind, MD;  Location: [facility];  Service: Gynecology;  Laterality:  Bilateral;   The following portions of the patient's history were reviewed and updated as appropriate: allergies, current medications, past family history, past medical history, past social history, past surgical history and problem list.    Review of Systems:  Pertinent items noted in HPI and remainder of comprehensive ROS otherwise negative.  Physical Exam:   General:  Alert, oriented and cooperative.   Mental Status: Normal mood and affect perceived. Normal judgment and thought content.  Physical exam deferred due to nature of the  encounter  Labs and Imaging No results found for this or any previous visit (from the past 336 hour(s)). DG Lumbar Spine Complete  Result Date: 07/13/2019 CLINICAL DATA:  Back pain after a syncopal episode which led to a fall. EXAM: LUMBAR SPINE - COMPLETE 4+ VIEW COMPARISON:  Radiographs dated 01/29/2019 and CT scan of the abdomen and pelvis dated 01/09/2014 FINDINGS: There is no fracture or bone destruction. Lateral alignment is normal. No disc space narrowing. Bilateral pars defects at L5. IMPRESSION: Bilateral pars defects at L5. Otherwise, normal exam. Electronically Signed   By: Francene Boyers M.D.   On: 07/13/2019 20:14   CT Head Wo Contrast  Result Date: 07/13/2019 CLINICAL DATA:  Headache, syncope, hit head EXAM: CT HEAD WITHOUT CONTRAST TECHNIQUE: Contiguous axial images were obtained from the base of the skull through the vertex without intravenous contrast. COMPARISON:  01/29/2019 FINDINGS: Brain: No acute intracranial abnormality. Specifically, no hemorrhage, hydrocephalus, mass lesion, acute infarction, or significant intracranial injury. Vascular: No hyperdense vessel or unexpected calcification. Skull: No acute calvarial abnormality. Sinuses/Orbits: Visualized paranasal sinuses and mastoids clear. Orbital soft tissues unremarkable. Other: None IMPRESSION: Normal study. Electronically Signed   By: Charlett Nose M.D.   On: 07/13/2019 19:57      Assessment and Plan:     Acute and chronic situational stress due to family grief issues and family dynamics     I will allow the patient to have 6 0.5 mg Ativan for the weekend.  Made it very clear to the patient this will be the last anxiolytic we right, and that counseling is the approach that she needs to pursue    I discussed the assessment and treatment plan with the patient. The patient was provided an opportunity to ask questions and all were answered. The patient agreed with the plan and demonstrated an understanding of the  instructions.   The patient was advised to call back or seek an in-person evaluation/go to the ED if the symptoms worsen or if the condition fails to improve as anticipated.  I provided 25 minutes of non-face-to-face time during this encounter.   By signing my name below, I, Arnette Norris, attest that this documentation has been prepared under the direction and in the presence of Tilda Burrow, MD. Electronically Signed: Arnette Norris Medical Scribe. 08/03/19. 9:41 AM.  I personally performed the services described in this documentation, which was SCRIBED in my presence. The recorded information has been reviewed and considered accurate. It has been edited as necessary during review. Tilda Burrow, MD

## 2019-08-03 NOTE — Telephone Encounter (Signed)
Will Rx x 6  Tabs for holiday stressors. Last time

## 2019-08-03 NOTE — Telephone Encounter (Signed)
See notes in chart from Televisit this morning.

## 2019-09-08 ENCOUNTER — Encounter: Payer: Self-pay | Admitting: Emergency Medicine

## 2019-09-08 ENCOUNTER — Other Ambulatory Visit: Payer: Self-pay

## 2019-09-08 ENCOUNTER — Ambulatory Visit
Admission: EM | Admit: 2019-09-08 | Discharge: 2019-09-08 | Disposition: A | Discharge status: Home / Self Care | Payer: Medicaid Other | Attending: Emergency Medicine | Admitting: Emergency Medicine

## 2019-09-08 ENCOUNTER — Emergency Department
Admission: EM | Admit: 2019-09-08 | Discharge: 2019-09-08 | Disposition: A | Discharge status: Home / Self Care | Payer: Medicaid Other | Attending: Emergency Medicine | Admitting: Emergency Medicine

## 2019-09-08 DIAGNOSIS — M5442 Lumbago with sciatica, left side: Secondary | ICD-10-CM | POA: Diagnosis not present

## 2019-09-08 DIAGNOSIS — S39012A Strain of muscle, fascia and tendon of lower back, initial encounter: Secondary | ICD-10-CM

## 2019-09-08 DIAGNOSIS — G43009 Migraine without aura, not intractable, without status migrainosus: Secondary | ICD-10-CM

## 2019-09-08 DIAGNOSIS — M545 Low back pain, unspecified: Secondary | ICD-10-CM

## 2019-09-08 DIAGNOSIS — M5441 Lumbago with sciatica, right side: Secondary | ICD-10-CM

## 2019-09-08 DIAGNOSIS — F17210 Nicotine dependence, cigarettes, uncomplicated: Secondary | ICD-10-CM | POA: Diagnosis not present

## 2019-09-08 DIAGNOSIS — G44209 Tension-type headache, unspecified, not intractable: Secondary | ICD-10-CM

## 2019-09-08 MED ORDER — DEXAMETHASONE SODIUM PHOSPHATE 10 MG/ML IJ SOLN
10.0000 mg | Freq: Once | INTRAMUSCULAR | Status: AC
  Administered 2019-09-08: 10 mg via INTRAMUSCULAR

## 2019-09-08 MED ORDER — KETOROLAC TROMETHAMINE 10 MG PO TABS: 10.0000 mg | ORAL_TABLET | Freq: Four times a day (QID) | ORAL | 0 refills | Status: DC | PRN

## 2019-09-08 MED ORDER — KETOROLAC TROMETHAMINE 60 MG/2ML IM SOLN
60.0000 mg | Freq: Once | INTRAMUSCULAR | Status: AC
  Administered 2019-09-08: 60 mg via INTRAMUSCULAR
  Filled 2019-09-08: qty 2

## 2019-09-08 MED ORDER — PREDNISONE 10 MG (21) PO TBPK: ORAL_TABLET | Freq: Every day | ORAL | 0 refills | Status: DC

## 2019-09-08 MED ORDER — ORPHENADRINE CITRATE 30 MG/ML IJ SOLN
60.0000 mg | Freq: Two times a day (BID) | INTRAMUSCULAR | Status: DC
  Administered 2019-09-08: 60 mg via INTRAMUSCULAR
  Filled 2019-09-08: qty 2

## 2019-09-08 MED ORDER — CYCLOBENZAPRINE HCL 10 MG PO TABS: 10.0000 mg | ORAL_TABLET | Freq: Three times a day (TID) | ORAL | 0 refills | Status: DC | PRN

## 2019-09-08 NOTE — ED Notes (Signed)
See triage note  Presents with lower back pain for couple of days   States pain is non radiating but does get worse with ambulation  Ambulates well to treatment room  Denies any injury or urinary sxs'  Also states she has had a h/a

## 2019-09-08 NOTE — ED Provider Notes (Signed)
[facility]   [id number] 09/08/19 Arrival Time: 1108  CC: Back PAIN  SUBJECTIVE: History from: patient. Jessica [name] is a 29 y.o. female complains of low back pain and tension type headache x 1 day.  Symptoms began after helping family move couches.  Localizes the pain to the low back and around head.  Describes the pain as constant, throbbing and sharp in character.  Has tried OTC medications without relief.  Symptoms are made worse with activity.  Denies similar symptoms in the past.  Denies fever, chills, erythema, ecchymosis, effusion, chest pain, SOB, nausea, vomiting, weakness, numbness and tingling, saddle paresthesias, loss of bowel or bladder function.      ROS: As per HPI.  All other pertinent ROS negative.     Past Medical History:  Diagnosis Date  . Abnormal Pap smear   . ADHD (attention deficit hyperactivity disorder)   . Anxiety   . Chronic back pain   . Depression   . Migraine headache   . UTI (lower urinary tract infection) 01/12/2013  . Vaginal Pap smear, abnormal    Past Surgical History:  Procedure Laterality Date  . CESAREAN SECTION N/A 09/08/2013   Procedure: CESAREAN SECTION;  Surgeon: Tereso Newcomer, MD;  Location: [facility];  Service: Obstetrics;  Laterality: N/A;  . CESAREAN SECTION N/A 02/10/2018   Procedure: REPEAT CESAREAN SECTION;  Surgeon: Federico Flake, MD;  Location: [facility];  Service: Obstetrics;  Laterality: N/A;  . TOOTH EXTRACTION    . TUBAL LIGATION Bilateral 04/08/2018   Procedure: LAPAROSCOPIC BILATERAL TUBAL STERILIZATION WITH FALLOPE RING APPLICATION;  Surgeon: Tilda Burrow, MD;  Location: [facility];  Service: Gynecology;  Laterality: Bilateral;   Allergies  Allergen Reactions  . Atarax [Hydroxyzine] Other (See Comments)    "makes me crazy"  . Trazodone And Nefazodone Other (See Comments)    Has weird dreams   No current facility-administered medications on file prior to encounter.   Current Outpatient  Medications on File Prior to Encounter  Medication Sig Dispense Refill  . [DISCONTINUED] clonazePAM (KLONOPIN) 0.5 MG tablet Take 1 tablet (0.5 mg total) by mouth 2 (two) times daily. 20 tablet 0   Social History   Socioeconomic History  . Marital status: Single    Spouse name: Not on file  . Number of children: 2  . Years of education: Not on file  . Highest education level: Not on file  Occupational History  . Not on file  Tobacco Use  . Smoking status: Current Some Day Smoker    Packs/day: 1.00    Types: Cigarettes  . Smokeless tobacco: Never Used  Substance and Sexual Activity  . Alcohol use: No  . Drug use: No  . Sexual activity: Not Currently    Birth control/protection: Surgical    Comment: tubal  Other Topics Concern  . Not on file  Social History Narrative  . Not on file   Social Determinants of Health   Financial Resource Strain:   . Difficulty of Paying Living Expenses: Not on file  Food Insecurity:   . Worried About Programme researcher, broadcasting/film/video in the Last Year: Not on file  . Ran Out of Food in the Last Year: Not on file  Transportation Needs:   . Lack of Transportation (Medical): Not on file  . Lack of Transportation (Non-Medical): Not on file  Physical Activity:   . Days of Exercise per Week: Not on file  . Minutes of Exercise per Session: Not  on file  Stress:   . Feeling of Stress : Not on file  Social Connections:   . Frequency of Communication with Friends and Family: Not on file  . Frequency of Social Gatherings with Friends and Family: Not on file  . Attends Religious Services: Not on file  . Active Member of Clubs or Organizations: Not on file  . Attends Archivist Meetings: Not on file  . Marital Status: Not on file  Intimate Partner Violence:   . Fear of Current or Ex-Partner: Not on file  . Emotionally Abused: Not on file  . Physically Abused: Not on file  . Sexually Abused: Not on file   Family History  Problem Relation Age of Onset   . Drug abuse Mother     OBJECTIVE:  Vitals:   09/08/19 1116  BP: 113/63  Pulse: 82  Resp: 16  Temp: 98.2 F (36.8 C)  TempSrc: Oral  SpO2: 98%    General appearance: ALERT; in no acute distress.  Head: NCAT Lungs: Normal respiratory effort; CTAB CV: RRR Musculoskeletal: Back  Inspection: Skin warm, dry, clear and intact without obvious erythema, effusion, or ecchymosis.  Palpation: midline and RT low back tenderness ROM: FROM active and passive Strength: 5/5 shld abduction, 5/5 shld adduction, 5/5 elbow flexion, 5/5 elbow extension, 5/5 grip strength, 5/5 hip flexion, 5/5 knee abduction, 5/5 knee adduction, 5/5 knee flexion, 5/5 knee extension Skin: warm and dry Neurologic: Ambulates without difficulty; Sensation intact about the upper/ lower extremities Psychological: alert and cooperative; normal mood and affect  ASSESSMENT & PLAN:  1. Acute bilateral low back pain with bilateral sciatica   2. Back strain, initial encounter   3. Tension headache    Meds ordered this encounter  Medications  . predniSONE (STERAPRED UNI-PAK 21 TAB) 10 MG (21) TBPK tablet    Sig: Take by mouth daily. Take 6 tabs by mouth daily  for 2 days, then 5 tabs for 2 days, then 4 tabs for 2 days, then 3 tabs for 2 days, 2 tabs for 2 days, then 1 tab by mouth daily for 2 days    Dispense:  42 tablet    Refill:  0    Order Specific Question:   Supervising Provider    Answer:   Raylene Everts [[id number]]   Steroid shot given in office Continue conservative management of rest, ice, heat, and gentle stretches/ massage Prednisone prescribed.  Take as directed and to completion Follow up with PCP if symptoms persist Return or go to the ER if you have any new or worsening symptoms (fever, chills, chest pain, abdominal pain, changes in bowel or bladder habits, pain radiating into lower legs, etc...)   Reviewed expectations re: course of current medical issues. Questions answered. Outlined signs and  symptoms indicating need for more acute intervention. Patient verbalized understanding. After Visit Summary given.    Lestine Box, PA-C 09/08/19 1142

## 2019-09-08 NOTE — Discharge Instructions (Signed)
Follow discharge care instruction take medication as directed. °

## 2019-09-08 NOTE — ED Triage Notes (Signed)
Pt presents with c/o low back pain and headache, pt states back pain radiates to both legs . Pt states began having pain last night
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
## 2019-09-08 NOTE — ED Triage Notes (Signed)
PT here with c/o lower back pain and headache for the past few days. Does have an old injury to her lower back. Walked with no issues to triage. NAD.

## 2019-09-08 NOTE — Discharge Instructions (Signed)
Steroid shot given in office Continue conservative management of rest, ice, heat, and gentle stretches/ massage Prednisone prescribed.  Take as directed and to completion Follow up with PCP if symptoms persist Return or go to the ER if you have any new or worsening symptoms (fever, chills, chest pain, abdominal pain, changes in bowel or bladder habits, pain radiating into lower legs, etc...)

## 2019-09-20 ENCOUNTER — Telehealth: Payer: Self-pay | Admitting: Obstetrics & Gynecology

## 2019-09-20 NOTE — Telephone Encounter (Signed)
Tried to reach the patient to remind her of her appointment/restrictions, mb is full. 

## 2019-09-21 ENCOUNTER — Other Ambulatory Visit: Payer: Self-pay

## 2019-09-21 ENCOUNTER — Ambulatory Visit (INDEPENDENT_AMBULATORY_CARE_PROVIDER_SITE_OTHER): Payer: Medicaid Other | Admitting: Obstetrics and Gynecology

## 2019-09-21 ENCOUNTER — Encounter: Payer: Self-pay | Admitting: Obstetrics and Gynecology

## 2019-09-21 VITALS — BP 130/87 | HR 82 | Ht 62.0 in | Wt 134.8 lb

## 2019-09-21 DIAGNOSIS — F418 Other specified anxiety disorders: Secondary | ICD-10-CM | POA: Diagnosis not present

## 2019-09-21 NOTE — Progress Notes (Signed)
Patient ID: [name], female   DOB: 04-11-1991, 29 y.o.   MRN: [id number]    [facility] Clinic Visit  @DATE @            Patient name: Jessica [name] MRN [name]  Date of birth: November 10, 1990  CC & HPI:  [name] is a 29 y.o. female presenting today for depression counseling and follow-up. She is having difficulty with her family dynamics. Her grandmother is the hospital with COVID,(Jessica [name] [age] yrs old) she has been in the hospital for a week and is upset she is not allowed back to visit with her.  Patient attributes this 62 is being her "real mother who raised her as her mother was not in the picture  She alleges that she has been trying to get in contact with her counselors office. Her old counselor had retired. She has been having high BP readings and anxiety attacks. One reading was 170/93. She is living with her husband and they are doing well, her youngest is a year old and is doing very well.  She says that she simply lies around all day she denies having any suicidal plans  It was made clear the patient that long-term use of anxiolytics are not the best answer. She has in the past gone to counseling, none recently. Patient is encouraged to return to counseling and has agreed to find counselor near [place] area.  For some reason though she lives in [place] she wants to go to [place] or [place]  She has a job interview at [name] in [employer].   ROS:  ROS   Pertinent History Reviewed:   Reviewed: Significant for  Medical         Past Medical History:  Diagnosis Date  . Abnormal Pap smear   . ADHD (attention deficit hyperactivity disorder)   . Anxiety   . Chronic back pain   . Depression   . Migraine headache   . UTI (lower urinary tract infection) 01/12/2013  . Vaginal Pap smear, abnormal                               Surgical Hx:    Past Surgical History:  Procedure Laterality Date  . CESAREAN SECTION N/A 09/08/2013   Procedure: CESAREAN SECTION;  Surgeon: 09/10/2013, MD;  Location: [facility];  Service: Obstetrics;  Laterality: N/A;  . CESAREAN SECTION N/A 02/10/2018   Procedure: REPEAT CESAREAN SECTION;  Surgeon: 04/13/2018, MD;  Location: [facility] BIRTHING SUITES;  Service: Obstetrics;  Laterality: N/A;  . TOOTH EXTRACTION    . TUBAL LIGATION Bilateral 04/08/2018   Procedure: LAPAROSCOPIC BILATERAL TUBAL STERILIZATION WITH FALLOPE RING APPLICATION;  Surgeon: 04/10/2018, MD;  Location: [facility];  Service: Gynecology;  Laterality: Bilateral;   Medications: Reviewed & Updated - see associated section                      No current outpatient medications on file.   Social History: Reviewed -  reports that she has been smoking cigarettes. She has been smoking about 1.00 pack per day. She has never used smokeless tobacco.  Objective Findings:  Vitals: Blood pressure 130/87, pulse 82, height 5\' 2"  (1.575 m), weight 134 lb 12.8 oz (61.1 kg), last menstrual period 09/12/2019, not currently breastfeeding.  PHYSICAL EXAMINATION General appearance - alert, well appearing, and in no distress Mental status -  normal mood, behavior, speech, dress, motor activity, and thought processes, affect appropriate to mood  PELVIC Deferred  Assessment & Plan:   A:  1. Depression counseling; Time spent face to face counseling: 37 minutes.  2. Anxiety; situational and family stressors  P:  Return to counseling sessions with her previous current counselor or seek a counselor.  Given the list of counselors  1.  2. Pt will NOT have klonopin refilled despite her continued hinting she wants to rely on the Klonopin.  I told her to contact our office if her mother does indeed have a severe Covid course    By signing my name below, I, Samul Dada, attest that this documentation has been prepared under the direction and in the presence of Jonnie Kind, MD. Electronically Signed: [place].  09/21/19. 2:26 PM.  I personally performed the services described in this documentation, which was SCRIBED in my presence. The recorded information has been reviewed and considered accurate. It has been edited as necessary during review. Jonnie Kind, MD

## 2019-10-03 ENCOUNTER — Telehealth (INDEPENDENT_AMBULATORY_CARE_PROVIDER_SITE_OTHER): Payer: Medicaid Other | Admitting: Obstetrics and Gynecology

## 2019-10-03 ENCOUNTER — Encounter: Payer: Self-pay | Admitting: Obstetrics and Gynecology

## 2019-10-03 ENCOUNTER — Other Ambulatory Visit: Payer: Self-pay

## 2019-10-03 VITALS — Ht 62.0 in

## 2019-10-03 DIAGNOSIS — F430 Acute stress reaction: Secondary | ICD-10-CM

## 2019-10-03 NOTE — Progress Notes (Signed)
   TELEHEALTH VIRTUAL GYNECOLOGY VISIT ENCOUNTER NOTE  I connected with Jessica [name] on 10/03/19 at 10:30 AM EST by telephone at home and verified that I am speaking with the correct person using two identifiers.   I discussed the limitations, risks, security and privacy concerns of performing an evaluation and management service by telephone and the availability of in person appointments. I also discussed with the patient that there may be a patient responsible charge related to this service. The patient expressed understanding and agreed to proceed.  ` History:  Jessica [name] is a 29 y.o. [phone] female being evaluated today for anxiety related to her grandmother's illness.  She alleges her grandmother is in the ICU in [place] and she has no access to information.  This was a grandmother that raised her.  She has limited communications with her 2 aunts who have further information on the grandmother     Past Medical History:  Diagnosis Date  . Abnormal Pap smear   . ADHD (attention deficit hyperactivity disorder)   . Anxiety   . Chronic back pain   . Depression   . Migraine headache   . UTI (lower urinary tract infection) 01/12/2013  . Vaginal Pap smear, abnormal    Past Surgical History:  Procedure Laterality Date  . CESAREAN SECTION N/A 09/08/2013   Procedure: CESAREAN SECTION;  Surgeon: Tereso Newcomer, MD;  Location: [facility];  Service: Obstetrics;  Laterality: N/A;  . CESAREAN SECTION N/A 02/10/2018   Procedure: REPEAT CESAREAN SECTION;  Surgeon: Federico Flake, MD;  Location: [facility] BIRTHING SUITES;  Service: Obstetrics;  Laterality: N/A;  . TOOTH EXTRACTION    . TUBAL LIGATION Bilateral 04/08/2018   Procedure: LAPAROSCOPIC BILATERAL TUBAL STERILIZATION WITH FALLOPE RING APPLICATION;  Surgeon: Tilda Burrow, MD;  Location: [facility];  Service: Gynecology;  Laterality: Bilateral;   The following portions of the patient's history were reviewed and updated as appropriate:  allergies, current medications, past family history, past medical history, past social history, past surgical history and problem list.   Health Maintenance:    Review of Systems:  Pertinent items noted in HPI and remainder of comprehensive ROS otherwise negative.  Physical Exam:   General:  Alert, oriented and cooperative.   Mental Status: Normal mood and affect perceived. Normal judgment and thought content.  Physical exam deferred due to nature of the encounter  Labs and Imaging No results found for this or any previous visit (from the past 336 hour(s)). No results found.    Assessment and Plan:     There are no diagnoses linked to this encounter.      I discussed the assessment and treatment plan with the patient. The patient was provided an opportunity to ask questions and all were answered. The patient agreed with the plan and demonstrated an understanding of the instructions.   The patient was advised to call back or seek an in-person evaluation/go to the ED if the symptoms worsen or if the condition fails to improve as anticipated.  I provided 15 minutes of non-face-to-face time during this encounter. I have assigned the patient to call  [facility] of her aunts daily and to report back to me through my chart if she finds out information, or does not.  Report back in 48 hours No medications are to be given.  Patient accepts this proposal  Tilda Burrow, MD Center for [facility], [facility]    `

## 2019-10-03 NOTE — Progress Notes (Signed)
Patient ID: KRIPA FOSKEY, female   DOB: Mar 08, 1991, 29 y.o.   MRN: [id number]    TELEHEALTH VIRTUAL GYNECOLOGY VISIT ENCOUNTER NOTE  I connected with [name] on 10/03/2019 at 10:30 AM EST by telephone at home and verified that I am speaking with the correct person using two identifiers.   I discussed the limitations, risks, security and privacy concerns of performing an evaluation and management service by telephone and the availability of in person appointments. I also discussed with the patient that there may be a patient responsible charge related to this service. The patient expressed understanding and agreed to proceed.   History:  DELORA GRAVATT is a 29 y.o. [phone] female being evaluated today for depression counseling.     Past Medical History:  Diagnosis Date  . Abnormal Pap smear   . ADHD (attention deficit hyperactivity disorder)   . Anxiety   . Chronic back pain   . Depression   . Migraine headache   . UTI (lower urinary tract infection) 01/12/2013  . Vaginal Pap smear, abnormal    Past Surgical History:  Procedure Laterality Date  . CESAREAN SECTION N/A 09/08/2013   Procedure: CESAREAN SECTION;  Surgeon: Tereso Newcomer, MD;  Location: [facility];  Service: Obstetrics;  Laterality: N/A;  . CESAREAN SECTION N/A 02/10/2018   Procedure: REPEAT CESAREAN SECTION;  Surgeon: Federico Flake, MD;  Location: [facility] BIRTHING SUITES;  Service: Obstetrics;  Laterality: N/A;  . TOOTH EXTRACTION    . TUBAL LIGATION Bilateral 04/08/2018   Procedure: LAPAROSCOPIC BILATERAL TUBAL STERILIZATION WITH FALLOPE RING APPLICATION;  Surgeon: Tilda Burrow, MD;  Location: [facility];  Service: Gynecology;  Laterality: Bilateral;   The following portions of the patient's history were reviewed and updated as appropriate: allergies, current medications, past family history, past medical history, past social history, past surgical history and problem list.   Review of Systems:  Pertinent items  noted in HPI and remainder of comprehensive ROS otherwise negative.  Physical Exam:   General:  Alert, oriented and cooperative.   Mental Status: Normal mood and affect perceived. Normal judgment and thought content.  Physical exam deferred due to nature of the encounter  Labs and Imaging No results found for this or any previous visit (from the past 336 hour(s)). No results found.    Assessment and Plan:     Depression counseling      I discussed the assessment and treatment plan with the patient. The patient was provided an opportunity to ask questions and all were answered. The patient agreed with the plan and demonstrated an understanding of the instructions.   The patient was advised to call back or seek an in-person evaluation/go to the ED if the symptoms worsen or if the condition fails to improve as anticipated.  I provided 10 minutes of non-face-to-face time during this encounter.   By signing my name below, I, Arnette Norris, attest that this documentation has been prepared under the direction and in the presence of Tilda Burrow, MD. Electronically Signed: Arnette Norris Medical Scribe. 10/03/19. 10:39 AM.  I personally performed the services described in this documentation, which was SCRIBED in my presence. The recorded information has been reviewed and considered accurate. It has been edited as necessary during review. Tilda Burrow, MD

## 2019-10-18 ENCOUNTER — Telehealth: Payer: Self-pay | Admitting: Obstetrics and Gynecology

## 2019-10-18 NOTE — Telephone Encounter (Signed)
Tried to reach the patient to remind her of her appointment/restrictions, mailbox is full. 

## 2019-10-19 ENCOUNTER — Ambulatory Visit: Payer: Medicaid Other | Admitting: Obstetrics and Gynecology

## 2019-11-17 ENCOUNTER — Encounter: Payer: Self-pay | Admitting: Obstetrics and Gynecology

## 2019-11-17 ENCOUNTER — Other Ambulatory Visit: Payer: Self-pay

## 2019-11-17 ENCOUNTER — Ambulatory Visit (INDEPENDENT_AMBULATORY_CARE_PROVIDER_SITE_OTHER): Payer: Medicaid Other | Admitting: Obstetrics and Gynecology

## 2019-11-17 VITALS — BP 114/78 | HR 85 | Ht 62.0 in | Wt 128.6 lb

## 2019-11-17 DIAGNOSIS — F4321 Adjustment disorder with depressed mood: Secondary | ICD-10-CM | POA: Diagnosis not present

## 2019-11-17 DIAGNOSIS — F418 Other specified anxiety disorders: Secondary | ICD-10-CM | POA: Diagnosis not present

## 2019-11-17 MED ORDER — CLONAZEPAM 0.5 MG PO TABS: 0.5000 mg | ORAL_TABLET | Freq: Every day | ORAL | 0 refills | Status: DC

## 2019-11-17 NOTE — Progress Notes (Signed)
Patient ID: DEVYNN HESSLER, female   DOB: April 25, 1991, 29 y.o.   MRN: [id number]    [facility] Visit  11/17/19          Patient name: Jessica [name] MRN [id number]  Date of birth: 1991/03/30  CC & HPI:  Jessica [name] is a 29 y.o. female presenting today for follow-up to her acute reaction to situational stress.   She has been working at [employer]. She is happy with her work and states that she is making "good money". She states that she is making progress with herself.   When she is not working, however, she notes that she lacks focus and concentration. She notes that she does occasionally get like this at work, but that her co-workers help her "snap out of it." She does note one major panic attack at work, however. She is still upset by the death of her cousin who died from an aneurysm in her intestines in her late twenties. She was a big support to pt. Pt lost her mother recently, and grieves, though mother's drug habits were a neglect to pt for most of childhood. (my summary)  She has a personal fear about what might happen to her. At this time, she is in good health. One thing that really helps her recuperate is seeing her kids.   When she has a panic attack, she is flooded with the feeling of loss from her cousin, and the fear of the unknown with her grandmother. She feels the need to protect her children.   She begins seeing a therapeutic counselor in May 2021.    ROS:  Review of Systems  Psychiatric/Behavioral: Positive for depression. Negative for hallucinations, memory loss, substance abuse and suicidal ideas. The patient is nervous/anxious. The patient does not have insomnia.   All other systems reviewed and are negative.    Pertinent History Reviewed:  Reviewed: Significant for I personally performed the services described in this documentation, which was SCRIBED in my presence. The recorded information has been reviewed and considered accurate. It has been edited as  necessary during review. Jonnie Kind, MD   Medical         Past Medical History:  Diagnosis Date  . Abnormal Pap smear   . ADHD (attention deficit hyperactivity disorder)   . Anxiety   . Chronic back pain   . Depression   . Migraine headache   . UTI (lower urinary tract infection) 01/12/2013  . Vaginal Pap smear, abnormal                               Surgical Hx:    Past Surgical History:  Procedure Laterality Date  . CESAREAN SECTION N/A 09/08/2013   Procedure: CESAREAN SECTION;  Surgeon: Osborne Oman, MD;  Location: [place] [facility];  Service: Obstetrics;  Laterality: N/A;  . CESAREAN SECTION N/A 02/10/2018   Procedure: REPEAT CESAREAN SECTION;  Surgeon: Caren Macadam, MD;  Location: [facility];  Service: Obstetrics;  Laterality: N/A;  . TOOTH EXTRACTION    . TUBAL LIGATION Bilateral 04/08/2018   Procedure: LAPAROSCOPIC BILATERAL TUBAL STERILIZATION WITH FALLOPE RING APPLICATION;  Surgeon: Jonnie Kind, MD;  Location: [facility];  Service: Gynecology;  Laterality: Bilateral;   Medications: Reviewed & Updated - see associated section                      No  current outpatient medications on file.   Social History: Reviewed -  reports that she has been smoking cigarettes. She has been smoking about 1.00 pack per day. She has never used smokeless tobacco.  Objective Findings:  Vitals: Blood pressure 114/78, pulse 85, height 5\' 2"  (1.575 m), weight 128 lb 9.6 oz (58.3 kg), last menstrual period 11/10/2019, not currently breastfeeding.  PHYSICAL EXAMINATION General appearance - alert, well appearing, and in no distress, oriented to person, place, and time, normal appearing weight, well hydrated and anxious, crying.  Mental status - alert, oriented to person, place, and time, depressed mood, anxious  Chest - not examined Heart - not examined Abdomen - not examined Breasts - not examined Skin - normal coloration and turgor, no rashes, no suspicious skin lesions  noted   Assessment & Plan:   A:  1.  diffuse anxiety 2. Grief from losses in support 3. Steady progress.  P:  1.  Rx Klonopin x 30 Tabs. No refills. Pt aware that after this Rx, pt will need to address anxiety with counsellor, and it is my belief that she can continue to reduce her dependency on medication for anxiety.    By signing my name below, I, 01/10/2020, attest that this documentation has been prepared under the direction and in the presence of [employer], MD. Electronically Signed: Tilda Burrow Medical Scribe. 11/17/19. 11:06 AM.  I personally performed the services described in this documentation, which was SCRIBED in my presence. The recorded information has been reviewed and considered accurate. It has been edited as necessary during review. 01/17/20, MD

## 2019-12-30 ENCOUNTER — Telehealth: Payer: Self-pay | Admitting: Obstetrics and Gynecology

## 2019-12-30 NOTE — Telephone Encounter (Signed)

## 2020-01-02 ENCOUNTER — Ambulatory Visit: Payer: Medicaid Other | Admitting: Obstetrics and Gynecology

## 2020-01-05 ENCOUNTER — Telehealth: Payer: Self-pay | Admitting: Obstetrics and Gynecology

## 2020-01-05 ENCOUNTER — Telehealth: Payer: Self-pay | Admitting: *Deleted

## 2020-01-05 NOTE — Telephone Encounter (Signed)
LMOVM returning patient's call.  

## 2020-01-05 NOTE — Telephone Encounter (Signed)
Pt missed apt on 5/24 states she's having pain and bleeding after having tubal done. We don't have any apt's this week or the following week asking if someone will call her to follow up to see if we can fit her in sooner.

## 2020-01-17 ENCOUNTER — Other Ambulatory Visit: Payer: Self-pay

## 2020-01-17 ENCOUNTER — Ambulatory Visit
Admission: EM | Admit: 2020-01-17 | Discharge: 2020-01-17 | Disposition: A | Discharge status: Home / Self Care | Payer: Medicaid Other | Attending: Emergency Medicine | Admitting: Emergency Medicine

## 2020-01-17 DIAGNOSIS — G43009 Migraine without aura, not intractable, without status migrainosus: Secondary | ICD-10-CM

## 2020-01-17 DIAGNOSIS — J069 Acute upper respiratory infection, unspecified: Secondary | ICD-10-CM | POA: Diagnosis not present

## 2020-01-17 MED ORDER — FEXOFENADINE HCL 60 MG PO TABS
60.0000 mg | ORAL_TABLET | Freq: Two times a day (BID) | ORAL | 0 refills | Status: DC
Start: 2020-01-17 — End: 2020-03-26

## 2020-01-17 MED ORDER — DEXAMETHASONE SODIUM PHOSPHATE 10 MG/ML IJ SOLN
10.0000 mg | Freq: Once | INTRAMUSCULAR | Status: AC
  Administered 2020-01-17: 10 mg via INTRAMUSCULAR

## 2020-01-17 MED ORDER — TIZANIDINE HCL 2 MG PO CAPS
2.0000 mg | ORAL_CAPSULE | Freq: Every day | ORAL | 0 refills | Status: DC
Start: 2020-01-17 — End: 2020-03-26

## 2020-01-17 MED ORDER — KETOROLAC TROMETHAMINE 30 MG/ML IJ SOLN
30.0000 mg | Freq: Once | INTRAMUSCULAR | Status: AC
  Administered 2020-01-17: 30 mg via INTRAMUSCULAR

## 2020-01-17 MED ORDER — FLUTICASONE PROPIONATE 50 MCG/ACT NA SUSP
2.0000 | Freq: Every day | NASAL | 0 refills | Status: DC
Start: 2020-01-17 — End: 2020-03-26

## 2020-01-17 NOTE — ED Provider Notes (Signed)
[place]   [id number] 01/17/20 Arrival Time: 1301   CC: COVID symptoms  SUBJECTIVE: History from: patient.  Jessica [name] is a 29 y.o. female who presents with congestion, migraine, neck pain, fatigue, and decreased sleep x 2 day.  Denies sick exposure to COVID, flu or strep.  Denies recent travel.  Has tried OTC medication without relief.  Denies aggravating factors.  Reports previous symptoms in the past.   Complains of mild sore throat, and cough.  Denies fever, chills, rhinorrhea, SOB, wheezing, chest pain, nausea, vomiting, changes in bowel or bladder habits.    ROS: As per HPI.  All other pertinent ROS negative.     Past Medical History:  Diagnosis Date  . Abnormal Pap smear   . ADHD (attention deficit hyperactivity disorder)   . Anxiety   . Chronic back pain   . Depression   . Migraine headache   . UTI (lower urinary tract infection) 01/12/2013  . Vaginal Pap smear, abnormal    Past Surgical History:  Procedure Laterality Date  . CESAREAN SECTION N/A 09/08/2013   Procedure: CESAREAN SECTION;  Surgeon: Osborne Oman, MD;  Location: [place] [facility];  Service: Obstetrics;  Laterality: N/A;  . CESAREAN SECTION N/A 02/10/2018   Procedure: REPEAT CESAREAN SECTION;  Surgeon: Caren Macadam, MD;  Location: [facility];  Service: Obstetrics;  Laterality: N/A;  . TOOTH EXTRACTION    . TUBAL LIGATION Bilateral 04/08/2018   Procedure: LAPAROSCOPIC BILATERAL TUBAL STERILIZATION WITH FALLOPE RING APPLICATION;  Surgeon: Jonnie Kind, MD;  Location: [facility];  Service: Gynecology;  Laterality: Bilateral;   Allergies  Allergen Reactions  . Atarax [Hydroxyzine] Other (See Comments)    "makes me crazy"  . Trazodone And Nefazodone Other (See Comments)    Has weird dreams   No current facility-administered medications on file prior to encounter.   Current Outpatient Medications on File Prior to Encounter  Medication Sig Dispense Refill  . clonazePAM (KLONOPIN) 0.5  MG tablet Take 1 tablet (0.5 mg total) by mouth daily. 30 tablet 0   Social History   Socioeconomic History  . Marital status: Married    Spouse name: Not on file  . Number of children: 2  . Years of education: Not on file  . Highest education level: Not on file  Occupational History  . Not on file  Tobacco Use  . Smoking status: Current Some Day Smoker    Packs/day: 1.00    Types: Cigarettes  . Smokeless tobacco: Never Used  Substance and Sexual Activity  . Alcohol use: No  . Drug use: No  . Sexual activity: Not Currently    Birth control/protection: Surgical    Comment: tubal  Other Topics Concern  . Not on file  Social History Narrative  . Not on file   Social Determinants of Health   Financial Resource Strain:   . Difficulty of Paying Living Expenses:   Food Insecurity:   . Worried About Charity fundraiser in the Last Year:   . Arboriculturist in the Last Year:   Transportation Needs:   . Film/video editor (Medical):   Marland Kitchen Lack of Transportation (Non-Medical):   Physical Activity:   . Days of Exercise per Week:   . Minutes of Exercise per Session:   Stress:   . Feeling of Stress :   Social Connections:   . Frequency of Communication with Friends and Family:   . Frequency of Social Gatherings with Friends  and Family:   . Attends Religious Services:   . Active Member of Clubs or Organizations:   . Attends Banker Meetings:   Marland Kitchen Marital Status:   Intimate Partner Violence:   . Fear of Current or Ex-Partner:   . Emotionally Abused:   Marland Kitchen Physically Abused:   . Sexually Abused:    Family History  Problem Relation Age of Onset  . Drug abuse Mother     OBJECTIVE:  Vitals:   01/17/20 1325  BP: 109/67  Pulse: 70  Resp: 16  Temp: 97.9 F (36.6 C)  SpO2: 98%     General appearance: alert; appears fatigued, but nontoxic; speaking in full sentences and tolerating own secretions HEENT: NCAT; Ears: EACs clear, TMs pearly gray; Eyes: PERRL.   EOM grossly intact. Nose: nares patent without rhinorrhea, Throat: oropharynx clear, tonsils non erythematous or enlarged, uvula midline  Neck: supple without LAD Lungs: unlabored respirations, symmetrical air entry; cough: mild; no respiratory distress; CTAB Heart: regular rate and rhythm.   Skin: warm and dry Psychological: alert and cooperative; normal mood and affect  ASSESSMENT & PLAN:  1. Viral URI   2. Migraine without aura and without status migrainosus, not intractable     Meds ordered this encounter  Medications  . ketorolac (TORADOL) 30 MG/ML injection 30 mg  . dexamethasone (DECADRON) injection 10 mg  . tizanidine (ZANAFLEX) 2 MG capsule    Sig: Take 1 capsule (2 mg total) by mouth at bedtime.    Dispense:  15 capsule    Refill:  0    Order Specific Question:   Supervising Provider    Answer:   Eustace Moore [[id number]]  . fexofenadine (ALLEGRA) 60 MG tablet    Sig: Take 1 tablet (60 mg total) by mouth 2 (two) times daily.    Dispense:  30 tablet    Refill:  0    Order Specific Question:   Supervising Provider    Answer:   Eustace Moore [[id number]]  . fluticasone (FLONASE) 50 MCG/ACT nasal spray    Sig: Place 2 sprays into both nostrils daily.    Dispense:  16 g    Refill:  0    Order Specific Question:   Supervising Provider    Answer:   Eustace Moore [[id number]]   Migraine cocktail given in office Get plenty of rest and push fluids Tessalon Perles prescribed for cough zyrtec for nasal congestion, runny nose, and/or sore throat flonase for nasal congestion and runny nose Zanaflex prescribed to help with muscles spasm.   Use medications daily for symptom relief Use OTC medications like ibuprofen or tylenol as needed fever or pain Call or go to the ED if you have any new or worsening symptoms such as fever, cough, shortness of breath, chest tightness, chest pain, turning blue, changes in mental status, etc...   Reviewed expectations re: course of  current medical issues. Questions answered. Outlined signs and symptoms indicating need for more acute intervention. Patient verbalized understanding. After Visit Summary given.         Rennis Harding, PA-C 01/17/20 1426

## 2020-01-17 NOTE — Discharge Instructions (Addendum)
Migraine cocktail given in office Get plenty of rest and push fluids allegra for nasal congestion, runny nose, and/or sore throat flonase for nasal congestion and runny nose Zanaflex prescribed to help with muscles spasm.   Use medications daily for symptom relief Use OTC medications like ibuprofen or tylenol as needed fever or pain Call or go to the ED if you have any new or worsening symptoms such as fever, cough, shortness of breath, chest tightness, chest pain, turning blue, changes in mental status, etc..Marland Kitchen

## 2020-01-17 NOTE — ED Triage Notes (Signed)
Cough, headache and runny nose x 1 week, OTC meds not helping. Declined COVID testing

## 2020-03-14 ENCOUNTER — Telehealth: Payer: Self-pay | Admitting: Obstetrics and Gynecology

## 2020-03-14 NOTE — Telephone Encounter (Signed)
Patient called stating that she would like a referral to see a provider that can reverse a tubal. Please contact pt  

## 2020-03-15 ENCOUNTER — Telehealth: Payer: Self-pay | Admitting: Adult Health

## 2020-03-15 NOTE — Telephone Encounter (Signed)
Patient called stating that she would like a referral to see a provider that can reverse a tubal. Please contact pt

## 2020-03-15 NOTE — Telephone Encounter (Signed)
Telephoned patient at home number and advised patient could contact Jessica [name] to see if would do procedure. Patient voiced understanding.

## 2020-03-25 ENCOUNTER — Other Ambulatory Visit: Payer: Self-pay

## 2020-03-25 ENCOUNTER — Encounter (HOSPITAL_COMMUNITY): Payer: Self-pay | Admitting: Emergency Medicine

## 2020-03-25 ENCOUNTER — Emergency Department (HOSPITAL_COMMUNITY)
Admission: EM | Admit: 2020-03-25 | Discharge: 2020-03-26 | Disposition: A | Discharge status: Home / Self Care | Payer: Medicaid Other | Attending: Emergency Medicine | Admitting: Emergency Medicine

## 2020-03-25 DIAGNOSIS — R109 Unspecified abdominal pain: Secondary | ICD-10-CM | POA: Insufficient documentation

## 2020-03-25 DIAGNOSIS — R519 Headache, unspecified: Secondary | ICD-10-CM | POA: Insufficient documentation

## 2020-03-25 DIAGNOSIS — R531 Weakness: Secondary | ICD-10-CM | POA: Diagnosis not present

## 2020-03-25 DIAGNOSIS — Z5321 Procedure and treatment not carried out due to patient leaving prior to being seen by health care provider: Secondary | ICD-10-CM | POA: Insufficient documentation

## 2020-03-25 LAB — BASIC METABOLIC PANEL
Anion gap: 10 (ref 5–15)
BUN: 12 mg/dL (ref 6–20)
CO2: 24 mmol/L (ref 22–32)
Calcium: 9.3 mg/dL (ref 8.9–10.3)
Chloride: 106 mmol/L (ref 98–111)
Creatinine, Ser: 0.92 mg/dL (ref 0.44–1.00)
GFR calc Af Amer: >60 mL/min (ref >60)
GFR calc non Af Amer: >60 mL/min (ref >60)
Glucose, Bld: 94 mg/dL (ref 70–99)
Potassium: 3.7 mmol/L (ref 3.5–5.1)
Sodium: 140 mmol/L (ref 135–145)

## 2020-03-25 LAB — CBC
HCT: 41.6 % (ref 36.0–46.0)
Hemoglobin: 13.9 g/dL (ref 12.0–15.0)
MCH: 29.1 pg (ref 26.0–34.0)
MCHC: 33.4 g/dL (ref 30.0–36.0)
MCV: 87 fL (ref 80.0–100.0)
Platelets: 285 10*3/uL (ref 150–400)
RBC: 4.78 MIL/uL (ref 3.87–5.11)
RDW: 12.4 % (ref 11.5–15.5)
WBC: 8.6 10*3/uL (ref 4.0–10.5)
nRBC: 0 % (ref 0.0–0.2)

## 2020-03-25 NOTE — ED Triage Notes (Signed)
Pt c/o generalized pain, headache and weakness that started an hour prior to arrival.

## 2020-03-26 ENCOUNTER — Telehealth: Payer: Self-pay | Admitting: Obstetrics and Gynecology

## 2020-03-26 ENCOUNTER — Encounter: Payer: Self-pay | Admitting: Obstetrics and Gynecology

## 2020-03-26 ENCOUNTER — Ambulatory Visit (INDEPENDENT_AMBULATORY_CARE_PROVIDER_SITE_OTHER): Payer: Medicaid Other | Admitting: Obstetrics and Gynecology

## 2020-03-26 VITALS — BP 114/71 | HR 61 | Ht 62.0 in | Wt 129.2 lb

## 2020-03-26 DIAGNOSIS — F418 Other specified anxiety disorders: Secondary | ICD-10-CM

## 2020-03-26 LAB — I-STAT BETA HCG BLOOD, ED (MC, WL, AP ONLY): I-stat hCG, quantitative: <5 m[IU]/mL (ref <5)

## 2020-03-26 MED ORDER — LORAZEPAM 0.5 MG PO TABS
0.5000 mg | ORAL_TABLET | Freq: Three times a day (TID) | ORAL | 0 refills | Status: DC | PRN
Start: 2020-03-26 — End: 2021-03-09

## 2020-03-26 NOTE — ED Notes (Signed)
Pt complaining of difficulty breathing while walking and talking in full sentences without difficulty

## 2020-03-26 NOTE — Patient Instructions (Addendum)
.  jfso
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
## 2020-03-26 NOTE — ED Notes (Signed)
PT walked out of lobby saying " I'm out, fuck this hospital"

## 2020-04-01 NOTE — Telephone Encounter (Signed)
See office appt note Pt aware that her alleged anxiety will requre her to work thru a Haematologist moving forward.

## 2020-04-02 ENCOUNTER — Emergency Department (HOSPITAL_COMMUNITY)
Admission: EM | Admit: 2020-04-02 | Discharge: 2020-04-03 | Disposition: A | Discharge status: Home / Self Care | Payer: Medicaid Other | Attending: Emergency Medicine | Admitting: Emergency Medicine

## 2020-04-02 ENCOUNTER — Ambulatory Visit
Admission: EM | Admit: 2020-04-02 | Discharge: 2020-04-02 | Disposition: A | Discharge status: Home / Self Care | Payer: Medicaid Other | Attending: Emergency Medicine | Admitting: Emergency Medicine

## 2020-04-02 ENCOUNTER — Emergency Department (HOSPITAL_COMMUNITY): Payer: Medicaid Other

## 2020-04-02 ENCOUNTER — Other Ambulatory Visit: Payer: Self-pay

## 2020-04-02 ENCOUNTER — Encounter: Payer: Self-pay | Admitting: Emergency Medicine

## 2020-04-02 ENCOUNTER — Encounter (HOSPITAL_COMMUNITY): Payer: Self-pay | Admitting: Emergency Medicine

## 2020-04-02 DIAGNOSIS — Z87442 Personal history of urinary calculi: Secondary | ICD-10-CM | POA: Diagnosis not present

## 2020-04-02 DIAGNOSIS — R300 Dysuria: Secondary | ICD-10-CM | POA: Diagnosis not present

## 2020-04-02 DIAGNOSIS — R1084 Generalized abdominal pain: Secondary | ICD-10-CM | POA: Insufficient documentation

## 2020-04-02 DIAGNOSIS — R42 Dizziness and giddiness: Secondary | ICD-10-CM | POA: Diagnosis not present

## 2020-04-02 DIAGNOSIS — R1030 Lower abdominal pain, unspecified: Secondary | ICD-10-CM | POA: Insufficient documentation

## 2020-04-02 DIAGNOSIS — Z5321 Procedure and treatment not carried out due to patient leaving prior to being seen by health care provider: Secondary | ICD-10-CM | POA: Diagnosis not present

## 2020-04-02 LAB — POCT URINE PREGNANCY: Preg Test, Ur: NEGATIVE

## 2020-04-02 LAB — URINALYSIS, ROUTINE W REFLEX MICROSCOPIC
Bilirubin Urine: NEGATIVE
Glucose, UA: NEGATIVE mg/dL
Hgb urine dipstick: NEGATIVE
Ketones, ur: NEGATIVE mg/dL
Nitrite: NEGATIVE
Protein, ur: NEGATIVE mg/dL
Specific Gravity, Urine: 1.012 (ref 1.005–1.030)
pH: 6 (ref 5.0–8.0)

## 2020-04-02 LAB — POCT URINALYSIS DIP (MANUAL ENTRY)
Bilirubin, UA: NEGATIVE
Blood, UA: NEGATIVE
Glucose, UA: NEGATIVE mg/dL
Ketones, POC UA: NEGATIVE mg/dL
Nitrite, UA: NEGATIVE
Protein Ur, POC: NEGATIVE mg/dL
Spec Grav, UA: 1.015 (ref 1.010–1.025)
Urobilinogen, UA: 0.2 E.U./dL
pH, UA: 7 (ref 5.0–8.0)

## 2020-04-02 LAB — COMPREHENSIVE METABOLIC PANEL
ALT: 18 U/L (ref 0–44)
AST: 16 U/L (ref 15–41)
Albumin: 4.5 g/dL (ref 3.5–5.0)
Alkaline Phosphatase: 37 U/L — ABNORMAL LOW (ref 38–126)
Anion gap: 13 (ref 5–15)
BUN: 12 mg/dL (ref 6–20)
CO2: 21 mmol/L — ABNORMAL LOW (ref 22–32)
Calcium: 9.7 mg/dL (ref 8.9–10.3)
Chloride: 104 mmol/L (ref 98–111)
Creatinine, Ser: 0.84 mg/dL (ref 0.44–1.00)
GFR calc Af Amer: >60 mL/min (ref >60)
GFR calc non Af Amer: >60 mL/min (ref >60)
Glucose, Bld: 91 mg/dL (ref 70–99)
Potassium: 3.7 mmol/L (ref 3.5–5.1)
Sodium: 138 mmol/L (ref 135–145)
Total Bilirubin: 0.5 mg/dL (ref 0.3–1.2)
Total Protein: 6.9 g/dL (ref 6.5–8.1)

## 2020-04-02 LAB — CBC
HCT: 45 % (ref 36.0–46.0)
Hemoglobin: 15.3 g/dL — ABNORMAL HIGH (ref 12.0–15.0)
MCH: 29.6 pg (ref 26.0–34.0)
MCHC: 34 g/dL (ref 30.0–36.0)
MCV: 87 fL (ref 80.0–100.0)
Platelets: 360 10*3/uL (ref 150–400)
RBC: 5.17 MIL/uL — ABNORMAL HIGH (ref 3.87–5.11)
RDW: 12.5 % (ref 11.5–15.5)
WBC: 8.4 10*3/uL (ref 4.0–10.5)
nRBC: 0 % (ref 0.0–0.2)

## 2020-04-02 LAB — LIPASE, BLOOD: Lipase: 33 U/L (ref 11–51)

## 2020-04-02 MED ORDER — IOHEXOL 300 MG/ML  SOLN
100.0000 mL | Freq: Once | INTRAMUSCULAR | Status: AC | PRN
  Administered 2020-04-02: 100 mL via INTRAVENOUS

## 2020-04-02 MED ORDER — HYDROMORPHONE HCL 1 MG/ML IJ SOLN
1.0000 mg | Freq: Once | INTRAMUSCULAR | Status: AC
  Administered 2020-04-02: 1 mg via INTRAVENOUS
  Filled 2020-04-02: qty 1

## 2020-04-02 MED ORDER — KETOROLAC TROMETHAMINE 30 MG/ML IJ SOLN
30.0000 mg | Freq: Once | INTRAMUSCULAR | Status: AC
  Administered 2020-04-02: 30 mg via INTRAMUSCULAR

## 2020-04-02 NOTE — ED Triage Notes (Signed)
Pt sts lower abd pain x 2 hours and dysuria; pt denies discharge

## 2020-04-02 NOTE — ED Triage Notes (Signed)
Pt presents to ED POV. Pt c/o generalized abd and lower back pain. Pt seen at [facility] today and sent here for possible appendicitis. Pt also reports dysuria. Pt reports pain came on gradually. Hx kidney stones, pt reports does not feel the same

## 2020-04-02 NOTE — ED Notes (Signed)
Pt notified triage RN that she felt as if she was going to pass out. Vital signs taken and triage RN notified.
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
## 2020-04-02 NOTE — Discharge Instructions (Addendum)
Urine culture pending. You are given Toradol today to help with pain. Important to plenty of fluids, monitor for kidney stones. Recommend you go to ER for worsening abdominal pain, back pain, nausea, vomiting, fever.

## 2020-04-03 LAB — URINE CULTURE

## 2020-04-03 NOTE — ED Notes (Signed)
Pt never returned

## 2020-04-03 NOTE — ED Notes (Signed)
Pt did not respond when called for vitals recheck. Stated earlier she was stepping outside and has not been seen coming back in

## 2020-04-09 ENCOUNTER — Telehealth: Payer: Medicaid Other | Admitting: Obstetrics and Gynecology

## 2020-04-11 ENCOUNTER — Other Ambulatory Visit: Payer: Self-pay

## 2020-04-11 ENCOUNTER — Ambulatory Visit (HOSPITAL_COMMUNITY)
Admission: EM | Admit: 2020-04-11 | Discharge: 2020-04-11 | Disposition: A | Discharge status: Home / Self Care | Payer: Medicaid Other | Attending: Psychiatry | Admitting: Psychiatry

## 2020-04-11 ENCOUNTER — Ambulatory Visit (HOSPITAL_COMMUNITY)
Admission: EM | Admit: 2020-04-11 | Discharge: 2020-04-11 | Disposition: A | Discharge status: Other Acute Inpatient Hospital | Payer: Medicaid Other

## 2020-04-11 DIAGNOSIS — F419 Anxiety disorder, unspecified: Secondary | ICD-10-CM | POA: Insufficient documentation

## 2020-04-11 DIAGNOSIS — Z635 Disruption of family by separation and divorce: Secondary | ICD-10-CM | POA: Insufficient documentation

## 2020-04-11 DIAGNOSIS — F411 Generalized anxiety disorder: Secondary | ICD-10-CM

## 2020-04-11 NOTE — Care Management (Signed)
Patient wants to receive services with a psychiatrist and a therapist on an outpatient basis.  Patient was linked to  Outpatient Behavioral Health Walk in clinic   Patient denies SI/HI/Psychosis/Substance Abuse.  Patient denies MSE Exam.   
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
## 2020-04-11 NOTE — ED Provider Notes (Signed)
Behavioral Health Medical Screening Exam  Jessica [name] is a 29 y.o. female presents to [facility] accompanied by her boyfriend.  She reports worsening anxiety as she states her OB/GYN has been prescribing her Klonopin 0.5 mg which is helped with her symptoms.  She denies suicidal or homicidal ideations.  Denies auditory visual hallucinations.  Denies that she is ever been followed by psychiatry or therapy in the past.  Reported stressors due to her recent separation/divorce between she and her husband.  TTS to provide additional outpatient resources.  Support,encouragement and reassurance was provided.  Total Time spent with patient: 15 minutes  Psychiatric Specialty Exam  Presentation  General Appearance:Appropriate for Environment  Eye Contact:Good  Speech:Clear and Coherent  Speech Volume:Normal  Handedness:No data recorded  Mood and Affect  Mood:Anxious  Affect:Congruent   Thought Process  Thought Processes:Coherent  Descriptions of Associations:Intact  Orientation:Full (Time, Place and Person)  Thought Content:Logical  Hallucinations:None  Ideas of Reference:None  Suicidal Thoughts:No  Homicidal Thoughts:No   Sensorium  Memory:Immediate Good;Remote Good  Judgment:Good  Insight:Good   Executive Functions  Concentration:Fair  Attention Span:Fair  Recall:Good  Fund of Knowledge:Good  Language:Good   Psychomotor Activity  Psychomotor Activity:Normal   Assets  Assets:Desire for Improvement;Social Support   Sleep  Sleep:Fair  Number of hours: No data recorded  Physical Exam: Physical Exam Vitals reviewed.  HENT:     Head: Normocephalic.  Neurological:     Mental Status: She is alert.  Psychiatric:        Attention and Perception: Attention normal.        Behavior: Behavior normal. Behavior is cooperative.        Thought Content: Thought content normal. Thought content does not include homicidal plan.         Cognition and Memory: Cognition normal.    Review of Systems  Psychiatric/Behavioral: Negative for depression (mild depression symptoms reported) and hallucinations. The patient is nervous/anxious.   All other systems reviewed and are negative.  Blood pressure 119/81, pulse 82, temperature 97.7 F (36.5 C), temperature source Tympanic, height 5\' 3"  (1.6 m), weight 129 lb (58.5 kg), last menstrual period 03/22/2020, SpO2 100 %. Body mass index is 22.85 kg/m.  Musculoskeletal: Strength & Muscle Tone: within normal limits Gait & Station: normal Patient leans: N/A   Recommendations: TTS to provided additional outpatient resources  Patient to follow-up with walk-in to [facility] for medication management  Based on my evaluation the patient does not appear to have an emergency medical condition.  [facility], NP 04/11/2020, 5:18 PM

## 2020-04-11 NOTE — ED Notes (Signed)
No belongings in locker

## 2020-04-11 NOTE — Discharge Instructions (Addendum)
Take all medications as prescribed. Keep all follow-up appointments as scheduled.  Do not consume alcohol or use illegal drugs while on prescription medications. Report any adverse effects from your medications to your primary care provider promptly.  In the event of recurrent symptoms or worsening symptoms, call 911, a crisis hotline, or go to the nearest emergency department for evaluation.   

## 2020-04-17 ENCOUNTER — Ambulatory Visit (HOSPITAL_COMMUNITY): Payer: Self-pay | Admitting: Clinical

## 2020-06-04 ENCOUNTER — Emergency Department (HOSPITAL_COMMUNITY)
Admission: EM | Admit: 2020-06-04 | Discharge: 2020-06-04 | Disposition: A | Discharge status: Home / Self Care | Payer: Medicaid Other | Attending: Emergency Medicine | Admitting: Emergency Medicine

## 2020-06-04 ENCOUNTER — Emergency Department (HOSPITAL_COMMUNITY): Payer: Medicaid Other

## 2020-06-04 ENCOUNTER — Encounter (HOSPITAL_COMMUNITY): Payer: Self-pay | Admitting: Emergency Medicine

## 2020-06-04 ENCOUNTER — Other Ambulatory Visit: Payer: Self-pay

## 2020-06-04 DIAGNOSIS — M791 Myalgia, unspecified site: Secondary | ICD-10-CM | POA: Diagnosis present

## 2020-06-04 DIAGNOSIS — M549 Dorsalgia, unspecified: Secondary | ICD-10-CM | POA: Insufficient documentation

## 2020-06-04 DIAGNOSIS — N898 Other specified noninflammatory disorders of vagina: Secondary | ICD-10-CM | POA: Diagnosis not present

## 2020-06-04 DIAGNOSIS — R102 Pelvic and perineal pain: Secondary | ICD-10-CM

## 2020-06-04 DIAGNOSIS — F17210 Nicotine dependence, cigarettes, uncomplicated: Secondary | ICD-10-CM | POA: Insufficient documentation

## 2020-06-04 DIAGNOSIS — Z87442 Personal history of urinary calculi: Secondary | ICD-10-CM | POA: Insufficient documentation

## 2020-06-04 DIAGNOSIS — Z87440 Personal history of urinary (tract) infections: Secondary | ICD-10-CM | POA: Insufficient documentation

## 2020-06-04 DIAGNOSIS — N200 Calculus of kidney: Secondary | ICD-10-CM

## 2020-06-04 DIAGNOSIS — B9689 Other specified bacterial agents as the cause of diseases classified elsewhere: Secondary | ICD-10-CM | POA: Diagnosis not present

## 2020-06-04 DIAGNOSIS — N12 Tubulo-interstitial nephritis, not specified as acute or chronic: Secondary | ICD-10-CM

## 2020-06-04 DIAGNOSIS — N10 Acute tubulo-interstitial nephritis: Secondary | ICD-10-CM | POA: Diagnosis not present

## 2020-06-04 DIAGNOSIS — Z20822 Contact with and (suspected) exposure to covid-19: Secondary | ICD-10-CM | POA: Diagnosis not present

## 2020-06-04 LAB — COMPREHENSIVE METABOLIC PANEL
ALT: 13 U/L (ref 0–44)
AST: 17 U/L (ref 15–41)
Albumin: 3.7 g/dL (ref 3.5–5.0)
Alkaline Phosphatase: 40 U/L (ref 38–126)
Anion gap: 10 (ref 5–15)
BUN: 9 mg/dL (ref 6–20)
CO2: 20 mmol/L — ABNORMAL LOW (ref 22–32)
Calcium: 8.6 mg/dL — ABNORMAL LOW (ref 8.9–10.3)
Chloride: 106 mmol/L (ref 98–111)
Creatinine, Ser: 0.73 mg/dL (ref 0.44–1.00)
GFR, Estimated: >60 mL/min (ref >60)
Glucose, Bld: 119 mg/dL — ABNORMAL HIGH (ref 70–99)
Potassium: 3.5 mmol/L (ref 3.5–5.1)
Sodium: 136 mmol/L (ref 135–145)
Total Bilirubin: 0.5 mg/dL (ref 0.3–1.2)
Total Protein: 6.8 g/dL (ref 6.5–8.1)

## 2020-06-04 LAB — CBC WITH DIFFERENTIAL/PLATELET
Abs Immature Granulocytes: 0.02 10*3/uL (ref 0.00–0.07)
Basophils Absolute: 0 10*3/uL (ref 0.0–0.1)
Basophils Relative: 0 %
Eosinophils Absolute: 0 10*3/uL (ref 0.0–0.5)
Eosinophils Relative: 0 %
HCT: 36.7 % (ref 36.0–46.0)
Hemoglobin: 12.3 g/dL (ref 12.0–15.0)
Immature Granulocytes: 0 %
Lymphocytes Relative: 11 %
Lymphs Abs: 1.1 10*3/uL (ref 0.7–4.0)
MCH: 29.8 pg (ref 26.0–34.0)
MCHC: 33.5 g/dL (ref 30.0–36.0)
MCV: 88.9 fL (ref 80.0–100.0)
Monocytes Absolute: 0.7 10*3/uL (ref 0.1–1.0)
Monocytes Relative: 7 %
Neutro Abs: 8.3 10*3/uL — ABNORMAL HIGH (ref 1.7–7.7)
Neutrophils Relative %: 82 %
Platelets: 222 10*3/uL (ref 150–400)
RBC: 4.13 MIL/uL (ref 3.87–5.11)
RDW: 12.1 % (ref 11.5–15.5)
WBC: 10.1 10*3/uL (ref 4.0–10.5)
nRBC: 0 % (ref 0.0–0.2)

## 2020-06-04 LAB — URINALYSIS, ROUTINE W REFLEX MICROSCOPIC
Bilirubin Urine: NEGATIVE
Glucose, UA: NEGATIVE mg/dL
Ketones, ur: NEGATIVE mg/dL
Nitrite: POSITIVE — AB
Protein, ur: NEGATIVE mg/dL
Specific Gravity, Urine: 1.006 (ref 1.005–1.030)
pH: 8 (ref 5.0–8.0)

## 2020-06-04 LAB — WET PREP, GENITAL
Clue Cells Wet Prep HPF POC: NONE SEEN
Sperm: NONE SEEN
Trich, Wet Prep: NONE SEEN
Yeast Wet Prep HPF POC: NONE SEEN

## 2020-06-04 LAB — RESPIRATORY PANEL BY RT PCR (FLU A&B, COVID)
Influenza A by PCR: NEGATIVE
Influenza B by PCR: NEGATIVE
SARS Coronavirus 2 by RT PCR: NEGATIVE

## 2020-06-04 LAB — I-STAT BETA HCG BLOOD, ED (MC, WL, AP ONLY): I-stat hCG, quantitative: <5 m[IU]/mL (ref <5)

## 2020-06-04 LAB — HCG, QUANTITATIVE, PREGNANCY: hCG, Beta Chain, Quant, S: <1 m[IU]/mL (ref <5)

## 2020-06-04 MED ORDER — SODIUM CHLORIDE 0.9 % IV BOLUS
1000.0000 mL | Freq: Once | INTRAVENOUS | Status: AC
  Administered 2020-06-04: 1000 mL via INTRAVENOUS

## 2020-06-04 MED ORDER — KETOROLAC TROMETHAMINE 60 MG/2ML IM SOLN
60.0000 mg | Freq: Once | INTRAMUSCULAR | Status: AC
  Administered 2020-06-04: 60 mg via INTRAMUSCULAR
  Filled 2020-06-04: qty 2

## 2020-06-04 MED ORDER — CEPHALEXIN 500 MG PO CAPS
500.0000 mg | ORAL_CAPSULE | Freq: Once | ORAL | Status: AC
  Administered 2020-06-04: 500 mg via ORAL
  Filled 2020-06-04: qty 1

## 2020-06-04 MED ORDER — MORPHINE SULFATE (PF) 2 MG/ML IV SOLN
2.0000 mg | Freq: Once | INTRAVENOUS | Status: AC
  Administered 2020-06-04: 2 mg via INTRAVENOUS
  Filled 2020-06-04: qty 1

## 2020-06-04 MED ORDER — CEPHALEXIN 500 MG PO CAPS: 500.0000 mg | ORAL_CAPSULE | Freq: Two times a day (BID) | ORAL | 0 refills | Status: AC

## 2020-06-04 MED ORDER — MORPHINE SULFATE (PF) 2 MG/ML IV SOLN
1.0000 mg | Freq: Once | INTRAVENOUS | Status: AC
  Administered 2020-06-04: 1 mg via INTRAVENOUS
  Filled 2020-06-04: qty 1

## 2020-06-04 MED ORDER — ACETAMINOPHEN 325 MG PO TABS
650.0000 mg | ORAL_TABLET | Freq: Once | ORAL | Status: AC
  Administered 2020-06-04: 650 mg via ORAL
  Filled 2020-06-04: qty 2

## 2020-06-04 MED ORDER — ONDANSETRON 8 MG PO TBDP
8.0000 mg | ORAL_TABLET | Freq: Once | ORAL | Status: AC
  Administered 2020-06-04: 8 mg via ORAL
  Filled 2020-06-04: qty 1
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
## 2020-06-04 NOTE — Discharge Instructions (Addendum)
Your workup today showed a UTI that has likely traveled up to your kidneys.  Your scan did show a stone that is sitting in your right kidney but there is no evidence of movement or it blocking your ureter.  I have prescribed an antibiotic for 2 weeks, please make sure to take this until finished even if your symptoms resolve.  Return to the ER if you have worsening pain, fevers, chills etc.  You were also tested for gonorrhea and chlamydia today, if this is positive you will receive a call from the health department and her pharmacist.

## 2020-06-04 NOTE — ED Triage Notes (Signed)
Pt c/o of generalized body aches that started two hours ago. Pt states it hurts to breath. sats 100% on RA.

## 2020-06-05 LAB — GC/CHLAMYDIA PROBE AMP (~~LOC~~) NOT AT ARMC
Chlamydia: NEGATIVE
Comment: NEGATIVE
Comment: NORMAL
Neisseria Gonorrhea: NEGATIVE

## 2020-06-07 ENCOUNTER — Ambulatory Visit (HOSPITAL_COMMUNITY): Payer: Self-pay | Admitting: Psychiatry

## 2020-06-07 LAB — URINE CULTURE: Culture: >=100000 — AB

## 2020-06-08 ENCOUNTER — Telehealth: Payer: Self-pay | Admitting: *Deleted

## 2020-06-08 NOTE — Telephone Encounter (Signed)
Post ED Visit - Positive Culture Follow-up  Culture report reviewed by antimicrobial stewardship pharmacist: [facility] []  , Pharm.D. []  Enzo Bi, Pharm.D., BCPS AQ-ID []  , Pharm.D., BCPS []  Celedonio Miyamoto, Pharm.D., BCPS []  [place], Garvin Fila.D., BCPS, AAHIVP []  , Pharm.D., BCPS, AAHIVP [x]  Georgina Pillion, PharmD, BCPS []  , PharmD, BCPS []  [place], PharmD, BCPS []  [place], PharmD []  , PharmD, BCPS []  Estella Husk, PharmD  Pharmacy Team []  Lysle Pearl, PharmD []  , PharmD []  Phillips Climes, PharmD []  , Rph []  Agapito Games) , PharmD []  Verlan Friends, PharmD []  , PharmD []  Mervyn Gay, PharmD []  , PharmD []  Vinnie Level, PharmD []  [facility], PharmD []  , PharmD []  Len Childs, PharmD   Positive urine culture Treated with Cephalexin, organism sensitive to the same and no further patient follow-up is required at this time.  [facility] 06/08/2020, 9:02 AM

## 2020-06-12 ENCOUNTER — Telehealth: Payer: Self-pay | Admitting: *Deleted

## 2020-06-12 ENCOUNTER — Ambulatory Visit: Payer: Medicaid Other | Admitting: Obstetrics & Gynecology

## 2020-06-12 NOTE — Telephone Encounter (Signed)
Attempted to call patient to inform her that Dr Despina Hidden has looked through her chart and feels as though we are not the appropriate ongoing resource for the management of these issues for her.  She has either cancelled or no showed her visits with Behavioral Health.    Called patient again and was finally able to get in touch with her.  Informed her of the above and patient stated she was having some back pain due to a kidney stone and wanted to be seen.  I informed her there was not anything we could do for the kidney stone as it would need to pass on it's own and pain medication would not be prescribed.  Encouraged patient to make appt with Behavioral Health.   Pt stated "ok".

## 2020-08-30 ENCOUNTER — Other Ambulatory Visit: Payer: Self-pay

## 2020-08-30 ENCOUNTER — Ambulatory Visit
Admission: EM | Admit: 2020-08-30 | Discharge: 2020-08-30 | Disposition: A | Discharge status: Home / Self Care | Payer: Medicaid Other | Attending: Emergency Medicine | Admitting: Emergency Medicine

## 2020-08-30 ENCOUNTER — Encounter: Payer: Self-pay | Admitting: Emergency Medicine

## 2020-08-30 DIAGNOSIS — J069 Acute upper respiratory infection, unspecified: Secondary | ICD-10-CM | POA: Diagnosis not present

## 2020-08-30 DIAGNOSIS — N926 Irregular menstruation, unspecified: Secondary | ICD-10-CM

## 2020-08-30 DIAGNOSIS — Z3202 Encounter for pregnancy test, result negative: Secondary | ICD-10-CM | POA: Diagnosis not present

## 2020-08-30 LAB — POCT URINE PREGNANCY: Preg Test, Ur: NEGATIVE

## 2020-08-30 MED ORDER — BENZONATATE 100 MG PO CAPS: 100.0000 mg | ORAL_CAPSULE | ORAL | 0 refills | Status: DC | PRN

## 2020-08-30 MED ORDER — GUAIFENESIN 100 MG PO PACK: 100.0000 mg | PACK | Freq: Two times a day (BID) | ORAL | 0 refills | Status: DC

## 2020-08-30 NOTE — Discharge Instructions (Addendum)
Get plenty of rest and push fluids Tessalon Perles prescribed for cough/more than 6 tabs in 24 hours Guaifenesin was prescribed for chest congestion  Use medications daily for symptom relief Follow-up with PCP/OB/GYN Use OTC medications like ibuprofen or tylenol as needed fever or pain Call or go to the ED if you have any new or worsening symptoms such as fever, worsening cough, shortness of breath, chest tightness, chest pain, turning blue, changes in mental status, etc..Marland Kitchen

## 2020-08-30 NOTE — ED Triage Notes (Addendum)
Non productive cough  3-4 days.  Does not want covid test.  Pt request pregnancy test states she missed her cycle this month, has had tubal ligation.

## 2020-08-30 NOTE — ED Provider Notes (Addendum)
[facility] CARE CENTER   [id number] 08/30/20 Arrival Time: 1229   CC:URI        Pregnancy tgest  SUBJECTIVE: History from: patient.  Jessica [name] is a 30 y.o. female who presented to the urgent care for complaint of nonproductive cough for the past 3 to 4 days.  Denies sick exposure to COVID, flu or strep.  Denies recent travel.  Has tried OTC medication without relief.  Denies alleviating or aggravating factors.  Rep denies previous symptoms in the past.   Denies fever, chills, fatigue, sinus pain, rhinorrhea, sore throat, SOB, wheezing, chest pain, nausea, changes in bowel or bladder habits.    She also like to have a pregnancy test completed as she has missed her menstrual  cycle this month.  She is sexually active with 1 female partner and is not planning to get pregnant.  Has a tube ligation in place.  Denies similar symptoms in the past.  Denies chills, fever, nausea, vomiting, diarrhea.  ROS: As per HPI.  All other pertinent ROS negative.     Past Medical History:  Diagnosis Date  . Abnormal Pap smear   . ADHD (attention deficit hyperactivity disorder)   . Anxiety   . Chronic back pain   . Depression   . Migraine headache   . UTI (lower urinary tract infection) 01/12/2013  . Vaginal Pap smear, abnormal    Past Surgical History:  Procedure Laterality Date  . CESAREAN SECTION N/A 09/08/2013   Procedure: CESAREAN SECTION;  Surgeon: Tereso Newcomer, MD;  Location: [facility];  Service: Obstetrics;  Laterality: N/A;  . CESAREAN SECTION N/A 02/10/2018   Procedure: REPEAT CESAREAN SECTION;  Surgeon: Federico Flake, MD;  Location: [facility] BIRTHING SUITES;  Service: Obstetrics;  Laterality: N/A;  . TOOTH EXTRACTION    . TUBAL LIGATION Bilateral 04/08/2018   Procedure: LAPAROSCOPIC BILATERAL TUBAL STERILIZATION WITH FALLOPE RING APPLICATION;  Surgeon: Tilda Burrow, MD;  Location: [facility];  Service: Gynecology;  Laterality: Bilateral;   Allergies  Allergen Reactions  . Atarax  [Hydroxyzine] Other (See Comments)    "makes me crazy"  . Trazodone And Nefazodone Other (See Comments)    Has weird dreams   No current facility-administered medications on file prior to encounter.   Current Outpatient Medications on File Prior to Encounter  Medication Sig Dispense Refill  . LORazepam (ATIVAN) 0.5 MG tablet Take 1 tablet (0.5 mg total) by mouth every 8 (eight) hours as needed for anxiety. (Patient not taking: Reported on 06/04/2020) 30 tablet 0   Social History   Socioeconomic History  . Marital status: Married    Spouse name: Not on file  . Number of children: 2  . Years of education: Not on file  . Highest education level: Not on file  Occupational History  . Not on file  Tobacco Use  . Smoking status: Current Some Day Smoker    Packs/day: 1.00    Types: Cigarettes  . Smokeless tobacco: Never Used  Vaping Use  . Vaping Use: Never used  Substance and Sexual Activity  . Alcohol use: No  . Drug use: No  . Sexual activity: Not Currently    Birth control/protection: Surgical    Comment: tubal  Other Topics Concern  . Not on file  Social History Narrative  . Not on file   Social Determinants of Health   Financial Resource Strain: Not on file  Food Insecurity: Not on file  Transportation Needs: Not on file  Physical Activity:  Not on file  Stress: Not on file  Social Connections: Not on file  Intimate Partner Violence: Not on file   Family History  Problem Relation Age of Onset  . Drug abuse Mother     OBJECTIVE:  Vitals:   08/30/20 1241 08/30/20 1242  BP:  127/80  Pulse:  75  Resp:  18  Temp:  98.2 F (36.8 C)  TempSrc:  Oral  SpO2:  98%  Weight: 130 lb (59 kg)   Height: 5\' 6"  (1.676 m)      General appearance: alert; appears fatigued, but nontoxic; speaking in full sentences and tolerating own secretions HEENT: NCAT; Ears: EACs clear, TMs pearly gray; Eyes: PERRL.  EOM grossly intact. Sinuses: nontender; Nose: nares patent without  rhinorrhea, Throat: oropharynx clear, tonsils non erythematous or enlarged, uvula midline  Neck: supple without LAD Lungs: unlabored respirations, symmetrical air entry; cough: moderate; no respiratory distress; CTAB Heart: regular rate and rhythm.  Radial pulses 2+ symmetrical bilaterally Skin: warm and dry Psychological: alert and cooperative; normal mood and affect  LABS:  Results for orders placed or performed during the hospital encounter of 08/30/20 (from the past 24 hour(s))  POCT urine pregnancy     Status: None   Collection Time: 08/30/20 12:55 PM  Result Value Ref Range   Preg Test, Ur Negative Negative     ASSESSMENT & PLAN:  1. Viral URI with cough   2. Negative pregnancy test   3. Missed period     Meds ordered this encounter  Medications  . benzonatate (TESSALON) 100 MG capsule    Sig: Take 1 capsule (100 mg total) by mouth every 4 (four) hours as needed for cough.    Dispense:  30 capsule    Refill:  0  . Guaifenesin 100 MG PACK    Sig: Take 100 mg by mouth 2 (two) times daily.    Dispense:  30 each    Refill:  0   Patient is stable at discharge.  She declined to have a COVID test completed.  Tessalon Perles and guaifenesin will be prescribed.  Patient requested Hydromet to be prescribed, but will try Tessalon Perles first.   Discharge instructions  Get plenty of rest and push fluids Tessalon Perles prescribed for cough/more than 6 tabs in 24 hours Guaifenesin was prescribed for chest congestion  Use medications daily for symptom relief Use OTC medications like ibuprofen or tylenol as needed fever or pain Call or go to the ED if you have any new or worsening symptoms such as fever, worsening cough, shortness of breath, chest tightness, chest pain, turning blue, changes in mental status, etc...   Reviewed expectations re: course of current medical issues. Questions answered. Outlined signs and symptoms indicating need for more acute intervention. Patient  verbalized understanding. After Visit Summary given.         09/01/20, FNP 08/30/20 1313    09/01/20, FNP 08/30/20 1314

## 2020-09-03 ENCOUNTER — Telehealth: Payer: Self-pay | Admitting: Emergency Medicine

## 2020-09-03 MED ORDER — PROMETHAZINE-DM 6.25-15 MG/5ML PO SYRP
5.0000 mL | ORAL_SOLUTION | Freq: Four times a day (QID) | ORAL | 0 refills | Status: DC | PRN
Start: 2020-09-03 — End: 2020-09-05

## 2020-09-03 NOTE — Telephone Encounter (Signed)
Promethazine DM was sent to [facility]

## 2020-09-05 ENCOUNTER — Ambulatory Visit
Admission: EM | Admit: 2020-09-05 | Discharge: 2020-09-05 | Disposition: A | Discharge status: Home / Self Care | Payer: Medicaid Other | Attending: Family Medicine | Admitting: Family Medicine

## 2020-09-05 ENCOUNTER — Encounter: Payer: Self-pay | Admitting: Emergency Medicine

## 2020-09-05 ENCOUNTER — Other Ambulatory Visit: Payer: Self-pay

## 2020-09-05 DIAGNOSIS — H66002 Acute suppurative otitis media without spontaneous rupture of ear drum, left ear: Secondary | ICD-10-CM

## 2020-09-05 DIAGNOSIS — R059 Cough, unspecified: Secondary | ICD-10-CM

## 2020-09-05 MED ORDER — GUAIFENESIN-CODEINE 100-10 MG/5ML PO SYRP: 5.0000 mL | ORAL_SOLUTION | Freq: Three times a day (TID) | ORAL | 0 refills | Status: DC | PRN

## 2020-09-05 MED ORDER — AMOXICILLIN-POT CLAVULANATE 875-125 MG PO TABS: 1.0000 | ORAL_TABLET | Freq: Two times a day (BID) | ORAL | 0 refills | Status: DC

## 2020-09-05 NOTE — Discharge Instructions (Addendum)
I have sent in Augmentin for you to take twice a day for 7 days for the ear infection  I have sent in Cheratussin cough syrup for you to take as needed for cough.  This medication can make you sleepy so do not drive while you are taking this medication.  Follow up with this office or with primary care if symptoms are persisting.  Follow up in the ER for high fever, trouble swallowing, trouble breathing, other concerning symptoms.

## 2020-09-05 NOTE — ED Provider Notes (Signed)
[facility] CARE CENTER   [id number] 09/05/20 Arrival Time: 1138  CC: EAR PAIN  SUBJECTIVE: History from: patient.  Jessica [name] is a 30 y.o. female who presents with of bilateral ear pain for the last 5 days and cough for the last week. Denies a precipitating event, such as swimming or wearing ear plugs. Patient states the pain is constant and achy in character. Patient has taken tessalon Perles as well as promethazine syrup with no relief of the cough. Symptoms are made worse with lying down. Reports/ Denies similar symptoms in the past. Denies fever, chills, fatigue, ear discharge, sore throat, SOB, wheezing, chest pain, nausea, changes in bowel or bladder habits.    ROS: As per HPI.  All other pertinent ROS negative.     Past Medical History:  Diagnosis Date  . Abnormal Pap smear   . ADHD (attention deficit hyperactivity disorder)   . Anxiety   . Chronic back pain   . Depression   . Migraine headache   . UTI (lower urinary tract infection) 01/12/2013  . Vaginal Pap smear, abnormal    Past Surgical History:  Procedure Laterality Date  . CESAREAN SECTION N/A 09/08/2013   Procedure: CESAREAN SECTION;  Surgeon: Tereso Newcomer, MD;  Location: [facility];  Service: Obstetrics;  Laterality: N/A;  . CESAREAN SECTION N/A 02/10/2018   Procedure: REPEAT CESAREAN SECTION;  Surgeon: Federico Flake, MD;  Location: [facility] BIRTHING SUITES;  Service: Obstetrics;  Laterality: N/A;  . TOOTH EXTRACTION    . TUBAL LIGATION Bilateral 04/08/2018   Procedure: LAPAROSCOPIC BILATERAL TUBAL STERILIZATION WITH FALLOPE RING APPLICATION;  Surgeon: Tilda Burrow, MD;  Location: [facility];  Service: Gynecology;  Laterality: Bilateral;   Allergies  Allergen Reactions  . Atarax [Hydroxyzine] Other (See Comments)    "makes me crazy"  . Trazodone And Nefazodone Other (See Comments)    Has weird dreams   No current facility-administered medications on file prior to encounter.   Current Outpatient  Medications on File Prior to Encounter  Medication Sig Dispense Refill  . Guaifenesin 100 MG PACK Take 100 mg by mouth 2 (two) times daily. 30 each 0  . LORazepam (ATIVAN) 0.5 MG tablet Take 1 tablet (0.5 mg total) by mouth every 8 (eight) hours as needed for anxiety. (Patient not taking: Reported on 06/04/2020) 30 tablet 0   Social History   Socioeconomic History  . Marital status: Married    Spouse name: Not on file  . Number of children: 2  . Years of education: Not on file  . Highest education level: Not on file  Occupational History  . Not on file  Tobacco Use  . Smoking status: Current Some Day Smoker    Packs/day: 1.00    Types: Cigarettes  . Smokeless tobacco: Never Used  Vaping Use  . Vaping Use: Never used  Substance and Sexual Activity  . Alcohol use: No  . Drug use: No  . Sexual activity: Not Currently    Birth control/protection: Surgical    Comment: tubal  Other Topics Concern  . Not on file  Social History Narrative  . Not on file   Social Determinants of Health   Financial Resource Strain: Not on file  Food Insecurity: Not on file  Transportation Needs: Not on file  Physical Activity: Not on file  Stress: Not on file  Social Connections: Not on file  Intimate Partner Violence: Not on file   Family History  Problem Relation Age of Onset  .  Drug abuse Mother     OBJECTIVE:  Vitals:   09/05/20 1156  BP: 127/78  Pulse: 88  Resp: 18  Temp: 98.1 F (36.7 C)  TempSrc: Oral  SpO2: 97%     General appearance: alert; appears fatigued HEENT: Ears: EACs clear, R TM pearly gray with visible cone of light, without erythema, L TM erythematous, bulging, with effusion; Eyes: PERRL, EOMI grossly; Sinuses nontender to palpation; Nose: clear rhinorrhea; Throat: oropharynx mildly erythematous, tonsils 1+ without white tonsillar exudates, uvula midline Neck: supple without LAD Lungs: unlabored respirations, symmetrical air entry; cough: mild; no respiratory  distress, CTAB Heart: regular rate and rhythm.  Radial pulses 2+ symmetrical bilaterally Skin: warm and dry Psychological: alert and cooperative; normal mood and affect  Imaging: No results found.   ASSESSMENT & PLAN:  1. Non-recurrent acute suppurative otitis media of left ear without spontaneous rupture of tympanic membrane   2. Cough     Meds ordered this encounter  Medications  . amoxicillin-clavulanate (AUGMENTIN) 875-125 MG tablet    Sig: Take 1 tablet by mouth 2 (two) times daily for 7 days.    Dispense:  14 tablet    Refill:  0    Order Specific Question:   Supervising Provider    Answer:   Merrilee Jansky [id number]  . guaiFENesin-codeine (ROBITUSSIN AC) 100-10 MG/5ML syrup    Sig: Take 5 mLs by mouth 3 (three) times daily as needed for cough.    Dispense:  120 mL    Refill:  0    Order Specific Question:   Supervising Provider    Answer:   Merrilee Jansky [[id number]]    Rest and drink plenty of fluids Prescribed augmentin 875 BID for 7 days Prescribed Cheratussin cough syrup  Sedation precautions given take medications as directed and to completion Continue to use OTC ibuprofen and/ or tylenol as needed for pain control Follow up with PCP if symptoms persists Return here or go to the ER if you have any new or worsening symptoms   Reviewed expectations re: course of current medical issues. Questions answered. Outlined signs and symptoms indicating need for more acute intervention. Patient verbalized understanding. After Visit Summary given.         Moshe Cipro, NP 09/05/20 1219

## 2020-09-05 NOTE — ED Triage Notes (Signed)
Bilateral ear pain since Friday after taking a cough syrup she was prescribed

## 2020-09-10 ENCOUNTER — Encounter: Payer: Self-pay | Admitting: Emergency Medicine

## 2020-09-10 ENCOUNTER — Other Ambulatory Visit: Payer: Self-pay

## 2020-09-10 ENCOUNTER — Ambulatory Visit
Admission: EM | Admit: 2020-09-10 | Discharge: 2020-09-10 | Disposition: A | Discharge status: Home / Self Care | Payer: Medicaid Other | Attending: Family Medicine | Admitting: Family Medicine

## 2020-09-10 DIAGNOSIS — R059 Cough, unspecified: Secondary | ICD-10-CM

## 2020-09-10 DIAGNOSIS — H66005 Acute suppurative otitis media without spontaneous rupture of ear drum, recurrent, left ear: Secondary | ICD-10-CM

## 2020-09-10 MED ORDER — CEFDINIR 300 MG PO CAPS: 300.0000 mg | ORAL_CAPSULE | Freq: Two times a day (BID) | ORAL | 0 refills | Status: DC

## 2020-09-10 MED ORDER — BENZONATATE 100 MG PO CAPS: 100.0000 mg | ORAL_CAPSULE | Freq: Three times a day (TID) | ORAL | 0 refills | Status: DC

## 2020-09-10 MED ORDER — GUAIFENESIN-CODEINE 100-10 MG/5ML PO SYRP: 5.0000 mL | ORAL_SOLUTION | Freq: Three times a day (TID) | ORAL | 0 refills | Status: DC | PRN

## 2020-09-10 NOTE — Discharge Instructions (Signed)
I have sent in Cefdinir for you to take twice a day for 10 days  I have also refilled your cough syrup   Follow up with this office or with primary care if symptoms are persisting.  Follow up in the ER for high fever, trouble swallowing, trouble breathing, other concerning symptoms.

## 2020-09-10 NOTE — ED Triage Notes (Signed)
Diagnosed with ear infection on Friday states ear still hurts and feels like the antibiotic is not working.

## 2020-09-10 NOTE — ED Provider Notes (Signed)
[facility] CARE CENTER   [id number] 09/10/20 Arrival Time: 1106  CC: EAR PAIN  SUBJECTIVE: History from: patient.  Jessica [name] is a 30 y.o. female who presents with of continued L ear pain. Is currently being treated with Augmentin with no relief. Was seen in this office 09/04/20 with the same symptoms. Symptoms are made worse with lying down. Reports similar symptoms in the past. Denies fever, chills, fatigue, sinus pain, rhinorrhea, ear discharge, sore throat, SOB, wheezing, chest pain, nausea, changes in bowel or bladder habits.    ROS: As per HPI.  All other pertinent ROS negative.     Past Medical History:  Diagnosis Date  . Abnormal Pap smear   . ADHD (attention deficit hyperactivity disorder)   . Anxiety   . Chronic back pain   . Depression   . Migraine headache   . UTI (lower urinary tract infection) 01/12/2013  . Vaginal Pap smear, abnormal    Past Surgical History:  Procedure Laterality Date  . CESAREAN SECTION N/A 09/08/2013   Procedure: CESAREAN SECTION;  Surgeon: Tereso Newcomer, MD;  Location: [facility];  Service: Obstetrics;  Laterality: N/A;  . CESAREAN SECTION N/A 02/10/2018   Procedure: REPEAT CESAREAN SECTION;  Surgeon: Federico Flake, MD;  Location: [facility] BIRTHING SUITES;  Service: Obstetrics;  Laterality: N/A;  . TOOTH EXTRACTION    . TUBAL LIGATION Bilateral 04/08/2018   Procedure: LAPAROSCOPIC BILATERAL TUBAL STERILIZATION WITH FALLOPE RING APPLICATION;  Surgeon: Tilda Burrow, MD;  Location: [facility];  Service: Gynecology;  Laterality: Bilateral;   Allergies  Allergen Reactions  . Atarax [Hydroxyzine] Other (See Comments)    "makes me crazy"  . Trazodone And Nefazodone Other (See Comments)    Has weird dreams   No current facility-administered medications on file prior to encounter.   Current Outpatient Medications on File Prior to Encounter  Medication Sig Dispense Refill  . Guaifenesin 100 MG PACK Take 100 mg by mouth 2 (two) times daily. 30  each 0  . LORazepam (ATIVAN) 0.5 MG tablet Take 1 tablet (0.5 mg total) by mouth every 8 (eight) hours as needed for anxiety. (Patient not taking: Reported on 06/04/2020) 30 tablet 0   Social History   Socioeconomic History  . Marital status: Married    Spouse name: Not on file  . Number of children: 2  . Years of education: Not on file  . Highest education level: Not on file  Occupational History  . Not on file  Tobacco Use  . Smoking status: Current Some Day Smoker    Packs/day: 1.00    Types: Cigarettes  . Smokeless tobacco: Never Used  Vaping Use  . Vaping Use: Never used  Substance and Sexual Activity  . Alcohol use: No  . Drug use: No  . Sexual activity: Not Currently    Birth control/protection: Surgical    Comment: tubal  Other Topics Concern  . Not on file  Social History Narrative  . Not on file   Social Determinants of Health   Financial Resource Strain: Not on file  Food Insecurity: Not on file  Transportation Needs: Not on file  Physical Activity: Not on file  Stress: Not on file  Social Connections: Not on file  Intimate Partner Violence: Not on file   Family History  Problem Relation Age of Onset  . Drug abuse Mother     OBJECTIVE:  Vitals:   09/10/20 1232  BP: 131/85  Pulse: 82  Resp: 16  Temp: Marland Kitchen)  97.4 F (36.3 C)  TempSrc: Oral  SpO2: 99%     General appearance: alert; appears fatigued HEENT: Ears: EACs clear, L TM pearly gray with visible cone of light, without erythema, R TM erythematous, bulging, with effusion; Eyes: PERRL, EOMI grossly; Sinuses nontender to palpation; Nose: clear rhinorrhea; Throat: oropharynx mildly erythematous, tonsils 1+ without white tonsillar exudates, uvula midline Neck: supple without LAD Lungs: unlabored respirations, symmetrical air entry; cough: mild; no respiratory distress Heart: regular rate and rhythm.  Radial pulses 2+ symmetrical bilaterally Skin: warm and dry Psychological: alert and cooperative;  normal mood and affect  Imaging: No results found.   ASSESSMENT & PLAN:  1. Recurrent acute suppurative otitis media without spontaneous rupture of left tympanic membrane   2. Cough     Meds ordered this encounter  Medications  . cefdinir (OMNICEF) 300 MG capsule    Sig: Take 1 capsule (300 mg total) by mouth 2 (two) times daily for 10 days.    Dispense:  20 capsule    Refill:  0    Order Specific Question:   Supervising Provider    Answer:   Merrilee Jansky [id number]  . guaiFENesin-codeine (ROBITUSSIN AC) 100-10 MG/5ML syrup    Sig: Take 5 mLs by mouth 3 (three) times daily as needed for cough.    Dispense:  120 mL    Refill:  0    Order Specific Question:   Supervising Provider    Answer:   Merrilee Jansky [[id number]]    Rest and drink plenty of fluids Prescribed Cefdinir  Refilled cough syrup Take medications as directed and to completion Continue to use OTC ibuprofen and/ or tylenol as needed for pain control Follow up with PCP if symptoms persists Return here or go to the ER if you have any new or worsening symptoms   Reviewed expectations re: course of current medical issues. Questions answered. Outlined signs and symptoms indicating need for more acute intervention. Patient verbalized understanding. After Visit Summary given.         Moshe Cipro, NP 09/10/20 1333

## 2020-09-12 ENCOUNTER — Ambulatory Visit: Payer: Medicaid Other | Admitting: Obstetrics & Gynecology

## 2020-09-12 ENCOUNTER — Encounter: Payer: Self-pay | Admitting: Obstetrics & Gynecology

## 2020-09-12 ENCOUNTER — Other Ambulatory Visit: Payer: Self-pay

## 2020-09-12 ENCOUNTER — Other Ambulatory Visit (HOSPITAL_COMMUNITY)
Admission: RE | Admit: 2020-09-12 | Discharge: 2020-09-12 | Disposition: A | Discharge status: Home / Self Care | Payer: Medicaid Other | Source: Ambulatory Visit | Attending: Obstetrics & Gynecology | Admitting: Obstetrics & Gynecology

## 2020-09-12 VITALS — BP 116/78 | HR 83 | Ht 62.0 in | Wt 136.0 lb

## 2020-09-12 DIAGNOSIS — N3000 Acute cystitis without hematuria: Secondary | ICD-10-CM

## 2020-09-12 MED ORDER — CIPROFLOXACIN HCL 500 MG PO TABS: 500.0000 mg | ORAL_TABLET | Freq: Two times a day (BID) | ORAL | 0 refills | Status: DC

## 2020-09-12 NOTE — Addendum Note (Signed)
Addended by: Annamarie Dawley on: 09/12/2020 11:41 AM   Modules accepted: Orders

## 2020-09-12 NOTE — Progress Notes (Signed)
Chief Complaint  Patient presents with  . Pelvic Pain      30 y.o. [id number] Patient's last menstrual period was 09/05/2020. The current method of family planning is tubal ligation.  Outpatient Encounter Medications as of 09/12/2020  Medication Sig  . ciprofloxacin (CIPRO) 500 MG tablet Take 1 tablet (500 mg total) by mouth 2 (two) times daily.  Marland Kitchen LORazepam (ATIVAN) 0.5 MG tablet Take 1 tablet (0.5 mg total) by mouth every 8 (eight) hours as needed for anxiety. (Patient not taking: No sig reported)  . [DISCONTINUED] benzonatate (TESSALON) 100 MG capsule Take 1 capsule (100 mg total) by mouth every 8 (eight) hours.  . [DISCONTINUED] cefdinir (OMNICEF) 300 MG capsule Take 1 capsule (300 mg total) by mouth 2 (two) times daily for 10 days.  . [DISCONTINUED] Guaifenesin 100 MG PACK Take 100 mg by mouth 2 (two) times daily.  . [DISCONTINUED] guaiFENesin-codeine (ROBITUSSIN AC) 100-10 MG/5ML syrup Take 5 mLs by mouth 3 (three) times daily as needed for cough.   No facility-administered encounter medications on file as of 09/12/2020.    Subjective Pt with 3 days of lower pelvic pain  2 days of urinary frequency  Used the bathroom 5-6 times last night Asks for pain medicine Past Medical History:  Diagnosis Date  . Abnormal Pap smear   . ADHD (attention deficit hyperactivity disorder)   . Anxiety   . Chronic back pain   . Depression   . Migraine headache   . UTI (lower urinary tract infection) 01/12/2013  . Vaginal Pap smear, abnormal     Past Surgical History:  Procedure Laterality Date  . CESAREAN SECTION N/A 09/08/2013   Procedure: CESAREAN SECTION;  Surgeon: Tereso Newcomer, MD;  Location: [facility];  Service: Obstetrics;  Laterality: N/A;  . CESAREAN SECTION N/A 02/10/2018   Procedure: REPEAT CESAREAN SECTION;  Surgeon: Federico Flake, MD;  Location: [facility] BIRTHING SUITES;  Service: Obstetrics;  Laterality: N/A;  . TOOTH EXTRACTION    . TUBAL LIGATION Bilateral 04/08/2018    Procedure: LAPAROSCOPIC BILATERAL TUBAL STERILIZATION WITH FALLOPE RING APPLICATION;  Surgeon: Tilda Burrow, MD;  Location: [facility];  Service: Gynecology;  Laterality: Bilateral;    OB History    Gravida  2   Para  2   Term  2   Preterm      AB      Living  2     SAB      IAB      Ectopic      Multiple  0   Live Births  2           Allergies  Allergen Reactions  . Atarax [Hydroxyzine] Other (See Comments)    "makes me crazy"  . Trazodone And Nefazodone Other (See Comments)    Has weird dreams    Social History   Socioeconomic History  . Marital status: Married    Spouse name: Not on file  . Number of children: 2  . Years of education: Not on file  . Highest education level: Not on file  Occupational History  . Not on file  Tobacco Use  . Smoking status: Current Some Day Smoker    Packs/day: 1.00    Types: Cigarettes  . Smokeless tobacco: Never Used  Vaping Use  . Vaping Use: Never used  Substance and Sexual Activity  . Alcohol use: No  . Drug use: No  . Sexual activity: Not Currently    Birth  control/protection: Surgical    Comment: tubal  Other Topics Concern  . Not on file  Social History Narrative  . Not on file   Social Determinants of Health   Financial Resource Strain: Not on file  Food Insecurity: Not on file  Transportation Needs: Not on file  Physical Activity: Not on file  Stress: Not on file  Social Connections: Not on file    Family History  Problem Relation Age of Onset  . Drug abuse Mother     Medications:       Current Outpatient Medications:  .  ciprofloxacin (CIPRO) 500 MG tablet, Take 1 tablet (500 mg total) by mouth 2 (two) times daily., Disp: 14 tablet, Rfl: 0 .  LORazepam (ATIVAN) 0.5 MG tablet, Take 1 tablet (0.5 mg total) by mouth every 8 (eight) hours as needed for anxiety. (Patient not taking: No sig reported), Disp: 30 tablet, Rfl: 0  Objective Blood pressure 116/78, pulse 83, height 5\' 2"  (1.575 m),  weight 136 lb (61.7 kg), last menstrual period 09/05/2020.  General [facility]N female NAD Vulva:  normal appearing vulva with no masses, tenderness or lesions Vagina:  normal mucosa, no discharge Cervix:  Normal no lesions Uterus:  normal size, contour, position, consistency, mobility, non-tender Adnexa: ovaries:present,  normal adnexa in size, nontender and no masses  urine culture is done gen probe is done  Pertinent ROS Sex 3 weeks ago, nopain with intercourse No nausea, vomiting or diarrhea Nor fever chills or other constitutional symptoms   Labs or studies No new    Impression Diagnoses this Encounter::   ICD-10-CM   1. Acute cystitis without hematuria  N30.00     Established relevant diagnosis(es):   Plan/Recommendations: Meds ordered this encounter  Medications  . ciprofloxacin (CIPRO) 500 MG tablet    Sig: Take 1 tablet (500 mg total) by mouth 2 (two) times daily.    Dispense:  14 tablet    Refill:  0    Labs or Scans Ordered: No orders of the defined types were placed in this encounter.   Management:: cipro for 7 days Recommend pyridium for pain, pt has a history of issues with narcotic seeking, do not want to re establis that as an expectation   Follow up Return if symptoms worsen or fail to improve.      All questions were answered.

## 2020-09-13 LAB — CERVICOVAGINAL ANCILLARY ONLY
Chlamydia: NEGATIVE
Comment: NEGATIVE
Comment: NORMAL
Neisseria Gonorrhea: NEGATIVE

## 2020-09-14 LAB — URINE CULTURE: Organism ID, Bacteria: NO GROWTH

## 2020-10-06 ENCOUNTER — Ambulatory Visit
Admission: EM | Admit: 2020-10-06 | Discharge: 2020-10-06 | Disposition: A | Discharge status: Home / Self Care | Payer: Medicaid Other | Attending: Emergency Medicine | Admitting: Emergency Medicine

## 2020-10-06 ENCOUNTER — Encounter: Payer: Self-pay | Admitting: Emergency Medicine

## 2020-10-06 ENCOUNTER — Other Ambulatory Visit: Payer: Self-pay

## 2020-10-06 DIAGNOSIS — J069 Acute upper respiratory infection, unspecified: Secondary | ICD-10-CM

## 2020-10-06 DIAGNOSIS — Z1152 Encounter for screening for COVID-19: Secondary | ICD-10-CM

## 2020-10-06 MED ORDER — GUAIFENESIN-CODEINE 100-10 MG/5ML PO SYRP: 5.0000 mL | ORAL_SOLUTION | Freq: Three times a day (TID) | ORAL | 0 refills | Status: DC | PRN

## 2020-10-06 MED ORDER — DEXAMETHASONE 4 MG PO TABS: 4.0000 mg | ORAL_TABLET | Freq: Every day | ORAL | 0 refills | Status: AC

## 2020-10-06 MED ORDER — FLUTICASONE PROPIONATE 50 MCG/ACT NA SUSP: 1.0000 | Freq: Every day | NASAL | 0 refills | Status: DC

## 2020-10-06 NOTE — ED Triage Notes (Addendum)
Runny nose and cough x 2 days. Has had covid in the past but it has been longer than 3 months.

## 2020-10-06 NOTE — ED Provider Notes (Addendum)
[facility]   [id number] 10/06/20 Arrival Time: 1048   CC: COVID symptoms  SUBJECTIVE: History from: patient.  Jessica [name] is a 30 y.o. female who presented to the urgent care for complaint of runny nose and cough for the past 2 days.  Denies sick exposure to COVID, flu or strep.  Denies recent travel.  Has tried Sondra Come without relief.  Denies alleviating or aggravating factors.  Denies previous symptoms in the past.   Denies fever, chills, fatigue, sinus pain, rhinorrhea, sore throat, SOB, wheezing, chest pain, nausea, changes in bowel or bladder habits.      ROS: As per HPI.  All other pertinent ROS negative.     Past Medical History:  Diagnosis Date  . Abnormal Pap smear   . ADHD (attention deficit hyperactivity disorder)   . Anxiety   . Chronic back pain   . Depression   . Migraine headache   . UTI (lower urinary tract infection) 01/12/2013  . Vaginal Pap smear, abnormal    Past Surgical History:  Procedure Laterality Date  . CESAREAN SECTION N/A 09/08/2013   Procedure: CESAREAN SECTION;  Surgeon: Tereso Newcomer, MD;  Location: [facility];  Service: Obstetrics;  Laterality: N/A;  . CESAREAN SECTION N/A 02/10/2018   Procedure: REPEAT CESAREAN SECTION;  Surgeon: Federico Flake, MD;  Location: [facility] BIRTHING SUITES;  Service: Obstetrics;  Laterality: N/A;  . TOOTH EXTRACTION    . TUBAL LIGATION Bilateral 04/08/2018   Procedure: LAPAROSCOPIC BILATERAL TUBAL STERILIZATION WITH FALLOPE RING APPLICATION;  Surgeon: Tilda Burrow, MD;  Location: [facility];  Service: Gynecology;  Laterality: Bilateral;   Allergies  Allergen Reactions  . Atarax [Hydroxyzine] Other (See Comments)    "makes me crazy"  . Trazodone And Nefazodone Other (See Comments)    Has weird dreams   No current facility-administered medications on file prior to encounter.   Current Outpatient Medications on File Prior to Encounter  Medication Sig Dispense Refill  . ciprofloxacin (CIPRO)  500 MG tablet Take 1 tablet (500 mg total) by mouth 2 (two) times daily. 14 tablet 0  . LORazepam (ATIVAN) 0.5 MG tablet Take 1 tablet (0.5 mg total) by mouth every 8 (eight) hours as needed for anxiety. (Patient not taking: No sig reported) 30 tablet 0   Social History   Socioeconomic History  . Marital status: Married    Spouse name: Not on file  . Number of children: 2  . Years of education: Not on file  . Highest education level: Not on file  Occupational History  . Not on file  Tobacco Use  . Smoking status: Current Some Day Smoker    Packs/day: 1.00    Types: Cigarettes  . Smokeless tobacco: Never Used  Vaping Use  . Vaping Use: Never used  Substance and Sexual Activity  . Alcohol use: No  . Drug use: No  . Sexual activity: Not Currently    Birth control/protection: Surgical    Comment: tubal  Other Topics Concern  . Not on file  Social History Narrative  . Not on file   Social Determinants of Health   Financial Resource Strain: Not on file  Food Insecurity: Not on file  Transportation Needs: Not on file  Physical Activity: Not on file  Stress: Not on file  Social Connections: Not on file  Intimate Partner Violence: Not on file   Family History  Problem Relation Age of Onset  . Drug abuse Mother     OBJECTIVE:  Vitals:   10/06/20 1103  BP: 118/74  Pulse: 94  Resp: 17  Temp: 98.5 F (36.9 C)  TempSrc: Oral  SpO2: 98%     General appearance: alert; appears fatigued, but nontoxic; speaking in full sentences and tolerating own secretions HEENT: NCAT; Ears: EACs clear, TMs pearly gray; Eyes: PERRL.  EOM grossly intact. Sinuses: nontender; Nose: nares patent without rhinorrhea, Throat: oropharynx clear, tonsils non erythematous or enlarged, uvula midline  Neck: supple without LAD Lungs: unlabored respirations, symmetrical air entry; cough: moderate; no respiratory distress; CTAB Heart: regular rate and rhythm.  Radial pulses 2+ symmetrical  bilaterally Skin: warm and dry Psychological: alert and cooperative; normal mood and affect  LABS:  No results found for this or any previous visit (from the past 24 hour(s)).   ASSESSMENT & PLAN:  1. Encounter for screening for COVID-19   2. URI with cough and congestion     Meds ordered this encounter  Medications  . guaiFENesin-codeine (ROBITUSSIN AC) 100-10 MG/5ML syrup    Sig: Take 5 mLs by mouth 3 (three) times daily as needed for cough.    Dispense:  120 mL    Refill:  0  . dexamethasone (DECADRON) 4 MG tablet    Sig: Take 1 tablet (4 mg total) by mouth daily for 7 days.    Dispense:  7 tablet    Refill:  0  . fluticasone (FLONASE) 50 MCG/ACT nasal spray    Sig: Place 1 spray into both nostrils daily for 14 days.    Dispense:  16 g    Refill:  0    Discharge instructions  COVID testing ordered.  It will take between 2-7 days for test results.  Someone will contact you regarding abnormal results.     Get plenty of rest and push fluids Guaifenesin/codeine prescribed for cough  flonase for nasal congestion and runny nose Decadron was prescribed Use medications daily for symptom relief Use OTC medications like ibuprofen or tylenol as needed fever or pain Call or go to the ED if you have any new or worsening symptoms such as fever, worsening cough, shortness of breath, chest tightness, chest pain, turning blue, changes in mental status, etc...   Reviewed expectations re: course of current medical issues. Questions answered. Outlined signs and symptoms indicating need for more acute intervention. Patient verbalized understanding. After Visit Summary given.      PDMP reviewed during this encounter.    Durward Parcel, FNP 10/06/20 1136    Durward Parcel, FNP 10/06/20 1138

## 2020-10-06 NOTE — Discharge Instructions (Addendum)
COVID testing ordered.  It will take between 2-7 days for test results.  Someone will contact you regarding abnormal results.     Get plenty of rest and push fluids Guaifenesin/codeine prescribed for cough  flonase for nasal congestion and runny nose Decadron was prescribed Use medications daily for symptom relief Use OTC medications like ibuprofen or tylenol as needed fever or pain Call or go to the ED if you have any new or worsening symptoms such as fever, worsening cough, shortness of breath, chest tightness, chest pain, turning blue, changes in mental status, etc..Marland Kitchen

## 2020-10-07 LAB — COVID-19, FLU A+B NAA
Influenza A, NAA: NOT DETECTED
Influenza B, NAA: NOT DETECTED
SARS-CoV-2, NAA: NOT DETECTED

## 2020-10-22 ENCOUNTER — Encounter: Payer: Self-pay | Admitting: Emergency Medicine

## 2020-10-22 ENCOUNTER — Other Ambulatory Visit: Payer: Self-pay

## 2020-10-22 ENCOUNTER — Ambulatory Visit
Admission: EM | Admit: 2020-10-22 | Discharge: 2020-10-22 | Disposition: A | Discharge status: Home / Self Care | Payer: Medicaid Other | Attending: Family Medicine | Admitting: Family Medicine

## 2020-10-22 DIAGNOSIS — J0110 Acute frontal sinusitis, unspecified: Secondary | ICD-10-CM

## 2020-10-22 DIAGNOSIS — J209 Acute bronchitis, unspecified: Secondary | ICD-10-CM

## 2020-10-22 MED ORDER — PROMETHAZINE-DM 6.25-15 MG/5ML PO SYRP: 5.0000 mL | ORAL_SOLUTION | Freq: Four times a day (QID) | ORAL | 0 refills | Status: DC | PRN

## 2020-10-22 MED ORDER — AMOXICILLIN 875 MG PO TABS: 875.0000 mg | ORAL_TABLET | Freq: Two times a day (BID) | ORAL | 0 refills | Status: DC

## 2020-10-22 NOTE — ED Triage Notes (Signed)
Cough and runny nose for past few days
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
## 2020-11-01 ENCOUNTER — Encounter (HOSPITAL_COMMUNITY): Payer: Self-pay | Admitting: *Deleted

## 2020-11-01 ENCOUNTER — Emergency Department (HOSPITAL_COMMUNITY)
Admission: EM | Admit: 2020-11-01 | Discharge: 2020-11-01 | Disposition: A | Discharge status: Home / Self Care | Payer: Medicaid Other | Attending: Emergency Medicine | Admitting: Emergency Medicine

## 2020-11-01 ENCOUNTER — Other Ambulatory Visit: Payer: Self-pay

## 2020-11-01 DIAGNOSIS — K0889 Other specified disorders of teeth and supporting structures: Secondary | ICD-10-CM | POA: Diagnosis not present

## 2020-11-01 DIAGNOSIS — F17210 Nicotine dependence, cigarettes, uncomplicated: Secondary | ICD-10-CM | POA: Diagnosis not present

## 2020-11-01 MED ORDER — IBUPROFEN 800 MG PO TABS: 800.0000 mg | ORAL_TABLET | Freq: Three times a day (TID) | ORAL | 0 refills | Status: DC | PRN

## 2020-11-01 MED ORDER — IBUPROFEN 800 MG PO TABS
800.0000 mg | ORAL_TABLET | Freq: Once | ORAL | Status: AC
  Administered 2020-11-01: 800 mg via ORAL
  Filled 2020-11-01: qty 1

## 2020-11-01 MED ORDER — OXYCODONE-ACETAMINOPHEN 5-325 MG PO TABS: 1.0000 | ORAL_TABLET | Freq: Four times a day (QID) | ORAL | 0 refills | Status: DC | PRN

## 2020-11-01 NOTE — Discharge Instructions (Signed)
You have been given a prescription of 800 mg Motrin to take for pain.  If that does not work you can take a Percocet especially at night.  You have been referred to Dr. Mia Creek a dentist in [place] and you can follow-up with over dentist she want to

## 2020-11-01 NOTE — ED Triage Notes (Signed)
Pt c/o right upper toothache and headache since 2100 last night. Pt reports she has a cracked tooth that she had a filling put in a month ago but it came out and she has had problems with it ever since. Pt reports she took 2 tablets of Aleve at 2200 last night and 2 tablets of Tylenol at 0200 this morning with no relief.
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
## 2020-11-09 ENCOUNTER — Encounter: Payer: Self-pay | Admitting: Emergency Medicine

## 2020-11-09 ENCOUNTER — Other Ambulatory Visit: Payer: Self-pay

## 2020-11-09 ENCOUNTER — Ambulatory Visit
Admission: EM | Admit: 2020-11-09 | Discharge: 2020-11-09 | Disposition: A | Discharge status: Home / Self Care | Payer: Medicaid Other | Attending: Emergency Medicine | Admitting: Emergency Medicine

## 2020-11-09 DIAGNOSIS — R059 Cough, unspecified: Secondary | ICD-10-CM | POA: Diagnosis not present

## 2020-11-09 MED ORDER — PREDNISONE 20 MG PO TABS: 20.0000 mg | ORAL_TABLET | Freq: Two times a day (BID) | ORAL | 0 refills | Status: AC

## 2020-11-09 MED ORDER — FLUTICASONE PROPIONATE 50 MCG/ACT NA SUSP: 2.0000 | Freq: Every day | NASAL | 0 refills | Status: DC

## 2020-11-09 MED ORDER — ALBUTEROL SULFATE HFA 108 (90 BASE) MCG/ACT IN AERS: 1.0000 | INHALATION_SPRAY | Freq: Four times a day (QID) | RESPIRATORY_TRACT | 0 refills | Status: DC | PRN

## 2020-11-09 NOTE — ED Triage Notes (Signed)
Cough x 2-3 weeks. Sore throat that started 2-3 days ago.  Seen here for cough on 03/14

## 2020-11-09 NOTE — ED Provider Notes (Signed)
[facility]   [id number] 11/09/20 Arrival Time: 1202   CC: Cough  SUBJECTIVE: History from: patient.  Jessica STAUDER is a 30 y.o. female who presents with sore throat and cough x 2-3 weeks.  Denies sick exposure to COVID, flu or strep.  Seen on 10/22/20 for similar symptoms.  Prescribed promethazine cough syrup without relief.  Symptoms are made worse at night.  Reports previous symptoms in the past that were relieved with codeine cough syrup.   Denies fever, chills, fatigue, sinus pain, rhinorrhea,SOB, wheezing, chest pain, nausea, changes in bowel or bladder habits.    ROS: As per HPI.  All other pertinent ROS negative.     Past Medical History:  Diagnosis Date  . Abnormal Pap smear   . ADHD (attention deficit hyperactivity disorder)   . Anxiety   . Chronic back pain   . Depression   . Migraine headache   . UTI (lower urinary tract infection) 01/12/2013  . Vaginal Pap smear, abnormal    Past Surgical History:  Procedure Laterality Date  . CESAREAN SECTION N/A 09/08/2013   Procedure: CESAREAN SECTION;  Surgeon: Tereso Newcomer, MD;  Location: [facility];  Service: Obstetrics;  Laterality: N/A;  . CESAREAN SECTION N/A 02/10/2018   Procedure: REPEAT CESAREAN SECTION;  Surgeon: Federico Flake, MD;  Location: [facility] BIRTHING SUITES;  Service: Obstetrics;  Laterality: N/A;  . TOOTH EXTRACTION    . TUBAL LIGATION Bilateral 04/08/2018   Procedure: LAPAROSCOPIC BILATERAL TUBAL STERILIZATION WITH FALLOPE RING APPLICATION;  Surgeon: Tilda Burrow, MD;  Location: [facility];  Service: Gynecology;  Laterality: Bilateral;   Allergies  Allergen Reactions  . Atarax [Hydroxyzine] Other (See Comments)    "makes me crazy"  . Trazodone And Nefazodone Other (See Comments)    Has weird dreams   No current facility-administered medications on file prior to encounter.   Current Outpatient Medications on File Prior to Encounter  Medication Sig Dispense Refill  . ibuprofen (ADVIL) 800 MG  tablet Take 1 tablet (800 mg total) by mouth every 8 (eight) hours as needed for moderate pain. 21 tablet 0  . LORazepam (ATIVAN) 0.5 MG tablet Take 1 tablet (0.5 mg total) by mouth every 8 (eight) hours as needed for anxiety. (Patient not taking: No sig reported) 30 tablet 0  . oxyCODONE-acetaminophen (PERCOCET) 5-325 MG tablet Take 1 tablet by mouth every 6 (six) hours as needed. 20 tablet 0  . promethazine-dextromethorphan (PROMETHAZINE-DM) 6.25-15 MG/5ML syrup Take 5 mLs by mouth 4 (four) times daily as needed for cough. 120 mL 0   Social History   Socioeconomic History  . Marital status: Married    Spouse name: Not on file  . Number of children: 2  . Years of education: Not on file  . Highest education level: Not on file  Occupational History  . Not on file  Tobacco Use  . Smoking status: Current Some Day Smoker    Packs/day: 1.00    Types: Cigarettes  . Smokeless tobacco: Never Used  Vaping Use  . Vaping Use: Never used  Substance and Sexual Activity  . Alcohol use: No  . Drug use: No  . Sexual activity: Not Currently    Birth control/protection: Surgical    Comment: tubal  Other Topics Concern  . Not on file  Social History Narrative  . Not on file   Social Determinants of Health   Financial Resource Strain: Not on file  Food Insecurity: Not on file  Transportation Needs: Not  on file  Physical Activity: Not on file  Stress: Not on file  Social Connections: Not on file  Intimate Partner Violence: Not on file   Family History  Problem Relation Age of Onset  . Drug abuse Mother     OBJECTIVE:  Vitals:   11/09/20 1222  BP: 99/61  Pulse: 67  Resp: 17  Temp: 98 F (36.7 C)  TempSrc: Oral  SpO2: 97%    General appearance: alert; well-appearing, nontoxic; speaking in full sentences and tolerating own secretions; unkept HEENT: NCAT; Ears: EACs clear, TMs pearly gray; Eyes: PERRL.  EOM grossly intact.Nose: nares patent without rhinorrhea, Throat: oropharynx  clear, tonsils non erythematous or enlarged, uvula midline  Neck: supple without LAD Lungs: unlabored respirations, symmetrical air entry; cough: mild; no respiratory distress; CTAB Heart: regular rate and rhythm.  Skin: warm and dry Psychological: alert and cooperative; normal mood and affect   ASSESSMENT & PLAN:  1. Cough     Meds ordered this encounter  Medications  . predniSONE (DELTASONE) 20 MG tablet    Sig: Take 1 tablet (20 mg total) by mouth 2 (two) times daily with a meal for 5 days.    Dispense:  10 tablet    Refill:  0    Order Specific Question:   Supervising Provider    Answer:   Eustace Moore [[id number]]  . fluticasone (FLONASE) 50 MCG/ACT nasal spray    Sig: Place 2 sprays into both nostrils daily.    Dispense:  16 g    Refill:  0    Order Specific Question:   Supervising Provider    Answer:   Eustace Moore [[id number]]  . albuterol (VENTOLIN HFA) 108 (90 Base) MCG/ACT inhaler    Sig: Inhale 1-2 puffs into the lungs every 6 (six) hours as needed for wheezing or shortness of breath.    Dispense:  18 g    Refill:  0    Order Specific Question:   Supervising Provider    Answer:   Eustace Moore [[id number]]   Get plenty of rest and push fluids Prescribed prednisone.  Take as directed and to completion Use inhaler as needed for coughing spasms Use flonase for nasal congestion and runny nose Use medications daily for symptom relief Use OTC medications like ibuprofen or tylenol as needed fever or pain Call or go to the ED if you have any new or worsening symptoms such as fever, worsening cough, shortness of breath, chest tightness, chest pain, turning blue, changes in mental status, etc...   Reviewed expectations re: course of current medical issues. Questions answered. Outlined signs and symptoms indicating need for more acute intervention. Patient verbalized understanding. After Visit Summary given.         Rennis Harding, PA-C 11/09/20 1311

## 2020-11-09 NOTE — Discharge Instructions (Addendum)
Get plenty of rest and push fluids Prescribed prednisone.  Take as directed and to completion Use inhaler as needed for coughing spasms Use flonase for nasal congestion and runny nose Use medications daily for symptom relief Use OTC medications like ibuprofen or tylenol as needed fever or pain Call or go to the ED if you have any new or worsening symptoms such as fever, worsening cough, shortness of breath, chest tightness, chest pain, turning blue, changes in mental status, etc..Marland Kitchen

## 2020-11-19 ENCOUNTER — Ambulatory Visit
Admission: EM | Admit: 2020-11-19 | Discharge: 2020-11-19 | Disposition: A | Discharge status: Home / Self Care | Payer: Medicaid Other

## 2020-11-19 ENCOUNTER — Encounter: Payer: Self-pay | Admitting: Emergency Medicine

## 2020-11-19 ENCOUNTER — Emergency Department (HOSPITAL_COMMUNITY)
Admission: EM | Admit: 2020-11-19 | Discharge: 2020-11-19 | Disposition: A | Discharge status: Home / Self Care | Payer: Medicaid Other

## 2020-11-19 ENCOUNTER — Other Ambulatory Visit: Payer: Self-pay

## 2020-11-19 DIAGNOSIS — M542 Cervicalgia: Secondary | ICD-10-CM | POA: Diagnosis not present

## 2020-11-19 DIAGNOSIS — M546 Pain in thoracic spine: Secondary | ICD-10-CM

## 2020-11-19 NOTE — ED Triage Notes (Signed)
Pain from neck all the way down her back that started last night.  Denies any injury

## 2020-11-19 NOTE — Discharge Instructions (Addendum)
Heat applications and toradol for pain
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
## 2021-01-04 ENCOUNTER — Other Ambulatory Visit: Payer: Self-pay

## 2021-01-04 ENCOUNTER — Emergency Department (HOSPITAL_COMMUNITY)
Admission: EM | Admit: 2021-01-04 | Discharge: 2021-01-04 | Discharge status: Against Medical Advice | Payer: Medicaid Other | Attending: Emergency Medicine | Admitting: Emergency Medicine

## 2021-01-04 ENCOUNTER — Encounter (HOSPITAL_COMMUNITY): Payer: Self-pay | Admitting: *Deleted

## 2021-01-04 ENCOUNTER — Ambulatory Visit
Admission: EM | Admit: 2021-01-04 | Discharge: 2021-01-04 | Disposition: A | Discharge status: Home / Self Care | Payer: Medicaid Other

## 2021-01-04 ENCOUNTER — Encounter: Payer: Self-pay | Admitting: Emergency Medicine

## 2021-01-04 DIAGNOSIS — F17210 Nicotine dependence, cigarettes, uncomplicated: Secondary | ICD-10-CM | POA: Insufficient documentation

## 2021-01-04 DIAGNOSIS — Z765 Malingerer [conscious simulation]: Secondary | ICD-10-CM

## 2021-01-04 DIAGNOSIS — R197 Diarrhea, unspecified: Secondary | ICD-10-CM

## 2021-01-04 DIAGNOSIS — R1031 Right lower quadrant pain: Secondary | ICD-10-CM

## 2021-01-04 DIAGNOSIS — R4689 Other symptoms and signs involving appearance and behavior: Secondary | ICD-10-CM | POA: Insufficient documentation

## 2021-01-04 DIAGNOSIS — R112 Nausea with vomiting, unspecified: Secondary | ICD-10-CM | POA: Diagnosis not present

## 2021-01-04 LAB — COMPREHENSIVE METABOLIC PANEL
ALT: 14 U/L (ref 0–44)
AST: 15 U/L (ref 15–41)
Albumin: 4.7 g/dL (ref 3.5–5.0)
Alkaline Phosphatase: 43 U/L (ref 38–126)
Anion gap: 7 (ref 5–15)
BUN: 12 mg/dL (ref 6–20)
CO2: 23 mmol/L (ref 22–32)
Calcium: 9.2 mg/dL (ref 8.9–10.3)
Chloride: 105 mmol/L (ref 98–111)
Creatinine, Ser: 0.8 mg/dL (ref 0.44–1.00)
GFR, Estimated: >60 mL/min (ref >60)
Glucose, Bld: 88 mg/dL (ref 70–99)
Potassium: 3.6 mmol/L (ref 3.5–5.1)
Sodium: 135 mmol/L (ref 135–145)
Total Bilirubin: 0.8 mg/dL (ref 0.3–1.2)
Total Protein: 7.9 g/dL (ref 6.5–8.1)

## 2021-01-04 LAB — CBC WITH DIFFERENTIAL/PLATELET
Abs Immature Granulocytes: 0.01 10*3/uL (ref 0.00–0.07)
Basophils Absolute: 0 10*3/uL (ref 0.0–0.1)
Basophils Relative: 1 %
Eosinophils Absolute: 0 10*3/uL (ref 0.0–0.5)
Eosinophils Relative: 1 %
HCT: 45.1 % (ref 36.0–46.0)
Hemoglobin: 15.2 g/dL — ABNORMAL HIGH (ref 12.0–15.0)
Immature Granulocytes: 0 %
Lymphocytes Relative: 40 %
Lymphs Abs: 2.2 10*3/uL (ref 0.7–4.0)
MCH: 30.1 pg (ref 26.0–34.0)
MCHC: 33.7 g/dL (ref 30.0–36.0)
MCV: 89.3 fL (ref 80.0–100.0)
Monocytes Absolute: 0.3 10*3/uL (ref 0.1–1.0)
Monocytes Relative: 6 %
Neutro Abs: 2.9 10*3/uL (ref 1.7–7.7)
Neutrophils Relative %: 52 %
Platelets: 235 10*3/uL (ref 150–400)
RBC: 5.05 MIL/uL (ref 3.87–5.11)
RDW: 12.5 % (ref 11.5–15.5)
WBC: 5.4 10*3/uL (ref 4.0–10.5)
nRBC: 0 % (ref 0.0–0.2)

## 2021-01-04 LAB — URINALYSIS, ROUTINE W REFLEX MICROSCOPIC
Bilirubin Urine: NEGATIVE
Glucose, UA: NEGATIVE mg/dL
Hgb urine dipstick: NEGATIVE
Ketones, ur: NEGATIVE mg/dL
Leukocytes,Ua: NEGATIVE
Nitrite: NEGATIVE
Protein, ur: NEGATIVE mg/dL
Specific Gravity, Urine: 1.011 (ref 1.005–1.030)
pH: 5 (ref 5.0–8.0)

## 2021-01-04 LAB — LIPASE, BLOOD: Lipase: 33 U/L (ref 11–51)

## 2021-01-04 LAB — HCG, QUANTITATIVE, PREGNANCY: hCG, Beta Chain, Quant, S: 2 m[IU]/mL (ref <5)

## 2021-01-04 MED ORDER — ONDANSETRON HCL 4 MG/2ML IJ SOLN
4.0000 mg | Freq: Once | INTRAMUSCULAR | Status: AC
  Administered 2021-01-04: 4 mg via INTRAVENOUS
  Filled 2021-01-04: qty 2

## 2021-01-04 MED ORDER — MORPHINE SULFATE (PF) 4 MG/ML IV SOLN
4.0000 mg | Freq: Once | INTRAVENOUS | Status: AC
  Administered 2021-01-04: 4 mg via INTRAVENOUS
  Filled 2021-01-04: qty 1

## 2021-01-04 MED ORDER — LORAZEPAM 2 MG/ML IJ SOLN
0.5000 mg | Freq: Once | INTRAMUSCULAR | Status: AC
  Administered 2021-01-04: 0.5 mg via INTRAVENOUS
  Filled 2021-01-04: qty 1

## 2021-01-04 MED ORDER — FENTANYL CITRATE (PF) 100 MCG/2ML IJ SOLN
50.0000 ug | Freq: Once | INTRAMUSCULAR | Status: AC
  Administered 2021-01-04: 50 ug via INTRAVENOUS
  Filled 2021-01-04: qty 2

## 2021-01-04 NOTE — ED Notes (Signed)
[name], PA informed that pt is pulling out IV and leaving facility.

## 2021-01-04 NOTE — ED Triage Notes (Signed)
Cough, diarrhea and RLQ pain x 3 days

## 2021-01-04 NOTE — ED Notes (Addendum)
Pt has called out and came to desk multiple times asking for pain medication and med for her "panic attack."  EDP to assess pt.

## 2021-01-04 NOTE — ED Notes (Signed)
Refused VS.  IV is removed.

## 2021-01-04 NOTE — ED Notes (Signed)
Pt has re-dressing and back in street clothes.  Pt says she feels like a zombie.  Pt continues to come into hall asking for something for her nerves.

## 2021-01-04 NOTE — ED Notes (Signed)
Pt ambulated to the bathroom without any trouble.  

## 2021-01-04 NOTE — ED Triage Notes (Signed)
Right lower quadrant pain 
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
## 2021-01-04 NOTE — ED Notes (Signed)
Pt ambulated to the bathroom without any issues. 

## 2021-01-04 NOTE — ED Provider Notes (Signed)
[facility] CARE CENTER   [id number] 01/04/21 Arrival Time: 1111 Abbreviated note CC: ABDOMINAL DISCOMFORT  SUBJECTIVE:  Jessica [name] is a 30 y.o. female who presents with complaint of abdominal discomfort, cough, and diarrhea x 3 days.  Denies a precipitating event.  Localizes pain to RLQ.  Describes as intermittent and sharp in character.  Denies alleviating or aggravating factors.  Complains of nausea, and vomiting  Denies fever, chills, chest pain, SOB, constipation, loss of bowel or bladder function.  Patient's last menstrual period was 12/28/2020.  ROS: As per HPI.  All other pertinent ROS negative.     Past Medical History:  Diagnosis Date  . Abnormal Pap smear   . ADHD (attention deficit hyperactivity disorder)   . Anxiety   . Chronic back pain   . Depression   . Migraine headache   . UTI (lower urinary tract infection) 01/12/2013  . Vaginal Pap smear, abnormal    Past Surgical History:  Procedure Laterality Date  . CESAREAN SECTION N/A 09/08/2013   Procedure: CESAREAN SECTION;  Surgeon: Tereso Newcomer, MD;  Location: [facility] OR[facility];  Service: Obstetrics;  Laterality: N/A;  . CESAREAN SECTION N/A 02/10/2018   Procedure: REPEAT CESAREAN SECTION;  Surgeon: Federico Flake, MD;  Location: [facility] BIRTHING SUITES;  Service: Obstetrics;  Laterality: N/A;  . TOOTH EXTRACTION    . TUBAL LIGATION Bilateral 04/08/2018   Procedure: LAPAROSCOPIC BILATERAL TUBAL STERILIZATION WITH FALLOPE RING APPLICATION;  Surgeon: Tilda Burrow, MD;  Location: [facility] ORS;  Service: Gynecology;  Laterality: Bilateral;   Allergies  Allergen Reactions  . Atarax [Hydroxyzine] Other (See Comments)    "makes me crazy"  . Trazodone And Nefazodone Other (See Comments)    Has weird dreams   No current facility-administered medications on file prior to encounter.   Current Outpatient Medications on File Prior to Encounter  Medication Sig Dispense Refill  . albuterol (VENTOLIN HFA) 108 (90 Base) MCG/ACT  inhaler Inhale 1-2 puffs into the lungs every 6 (six) hours as needed for wheezing or shortness of breath. 18 g 0  . fluticasone (FLONASE) 50 MCG/ACT nasal spray Place 2 sprays into both nostrils daily. 16 g 0  . ibuprofen (ADVIL) 800 MG tablet Take 1 tablet (800 mg total) by mouth every 8 (eight) hours as needed for moderate pain. 21 tablet 0  . LORazepam (ATIVAN) 0.5 MG tablet Take 1 tablet (0.5 mg total) by mouth every 8 (eight) hours as needed for anxiety. (Patient not taking: No sig reported) 30 tablet 0  . oxyCODONE-acetaminophen (PERCOCET) 5-325 MG tablet Take 1 tablet by mouth every 6 (six) hours as needed. 20 tablet 0  . promethazine-dextromethorphan (PROMETHAZINE-DM) 6.25-15 MG/5ML syrup Take 5 mLs by mouth 4 (four) times daily as needed for cough. 120 mL 0   Social History   Socioeconomic History  . Marital status: Married    Spouse name: Not on file  . Number of children: 2  . Years of education: Not on file  . Highest education level: Not on file  Occupational History  . Not on file  Tobacco Use  . Smoking status: Current Some Day Smoker    Packs/day: 1.00    Types: Cigarettes  . Smokeless tobacco: Never Used  Vaping Use  . Vaping Use: Never used  Substance and Sexual Activity  . Alcohol use: No  . Drug use: No  . Sexual activity: Not Currently    Birth control/protection: Surgical    Comment: tubal  Other Topics Concern  .  Not on file  Social History Narrative  . Not on file   Social Determinants of Health   Financial Resource Strain: Not on file  Food Insecurity: Not on file  Transportation Needs: Not on file  Physical Activity: Not on file  Stress: Not on file  Social Connections: Not on file  Intimate Partner Violence: Not on file   Family History  Problem Relation Age of Onset  . Drug abuse Mother      OBJECTIVE:  Vitals:   01/04/21 1150  BP: 126/84  Pulse: 68  Resp: 18  Temp: (!) 97.5 F (36.4 C)  TempSrc: Oral  SpO2: 97%    General  appearance: Alert; NAD HEENT: NCAT.  Oropharynx clear; EACs clear, TMs pearly gray; nares patent; oropharynx clear Lungs: clear to auscultation bilaterally without adventitious breath sounds Heart: regular rate and rhythm.   Abdomen: soft, non-distended; normal active bowel sounds; TTP over RLQ; TTP at McBurney's point; no guarding Back: no CVA tenderness Extremities: no edema; symmetrical with no gross deformities Skin: warm and dry Neurologic: normal gait Psychological: alert and cooperative; normal mood and affect   ASSESSMENT & PLAN:  1. RLQ abdominal pain   2. Diarrhea, unspecified type     No orders of the defined types were placed in this encounter.   Recommending further evaluation and management in the ED for RLQ pain.  Cannot rule out appendicitis in UC setting.  Patient aware and in agreement.     Rennis Harding, PA-C 01/04/21 1256

## 2021-01-22 ENCOUNTER — Ambulatory Visit
Admission: EM | Admit: 2021-01-22 | Discharge: 2021-01-22 | Disposition: A | Discharge status: Home / Self Care | Payer: Medicaid Other | Attending: Family Medicine | Admitting: Family Medicine

## 2021-01-22 ENCOUNTER — Encounter: Payer: Self-pay | Admitting: Emergency Medicine

## 2021-01-22 DIAGNOSIS — J069 Acute upper respiratory infection, unspecified: Secondary | ICD-10-CM | POA: Diagnosis not present

## 2021-01-22 MED ORDER — GUAIFENESIN-CODEINE 100-10 MG/5ML PO SOLN: 5.0000 mL | Freq: Three times a day (TID) | ORAL | 0 refills | Status: DC | PRN

## 2021-01-22 NOTE — Discharge Instructions (Addendum)
Be aware, your cough medication may cause drowsiness. Please do not drive, operate heavy machinery or make important decisions while on this medication, it can cloud your judgement.  

## 2021-01-22 NOTE — ED Provider Notes (Signed)
[facility] CARE CENTER   [id number] 01/22/21 Arrival Time: 1602  ASSESSMENT & PLAN:  1. Viral URI with cough    No signs of bacterial infection. OTC symptom care as needed. Discussed typical duration of viral illnesses.  Meds ordered this encounter  Medications   guaiFENesin-codeine 100-10 MG/5ML syrup    Sig: Take 5 mLs by mouth 3 (three) times daily as needed for cough.    Dispense:  90 mL    Refill:  0     Follow-up Information     [facility].   Specialty: Urgent Care Why: As needed. Contact information: [address] [place] [address] [phone]                Reviewed expectations re: course of current medical issues. Questions answered. Outlined signs and symptoms indicating need for more acute intervention. Understanding verbalized. After Visit Summary given.   SUBJECTIVE: History from: patient. Jessica [name] is a 30 y.o. female who reports cough and congestion; abrupt onset 2-3 d ago. Known COVID-19 contact: child with cold symptoms. Recent travel: none. Re. Denies: fever and difficulty breathing. Normal PO intake without n/v/d. Cough is affecting sleep.   OBJECTIVE:  Vitals:   01/22/21 1658  BP: 128/85  Pulse: 73  Resp: 16  Temp: 97.9 F (36.6 C)  TempSrc: Tympanic  SpO2: 98%    General appearance: alert; no distress Eyes: PERRLA; EOMI; conjunctiva normal HENT: [place]; AT; with mild nasal congestion Neck: supple  Lungs: speaks full sentences without difficulty; unlabored; CTAB Extremities: no edema Skin: warm and dry Neurologic: normal gait Psychological: alert and cooperative; normal mood and affect   Allergies  Allergen Reactions   Atarax [Hydroxyzine] Other (See Comments)    "makes me crazy"   Trazodone And Nefazodone Other (See Comments)    Has weird dreams    Past Medical History:  Diagnosis Date   Abnormal Pap smear    ADHD (attention deficit hyperactivity  disorder)    Anxiety    Chronic back pain    Depression    Migraine headache    UTI (lower urinary tract infection) 01/12/2013   Vaginal Pap smear, abnormal    Social History   Socioeconomic History   Marital status: Married    Spouse name: Not on file   Number of children: 2   Years of education: Not on file   Highest education level: Not on file  Occupational History   Not on file  Tobacco Use   Smoking status: Some Days    Packs/day: 1.00    Pack years: 0.00    Types: Cigarettes   Smokeless tobacco: Never  Vaping Use   Vaping Use: Never used  Substance and Sexual Activity   Alcohol use: No   Drug use: No   Sexual activity: Not Currently    Birth control/protection: Surgical    Comment: tubal  Other Topics Concern   Not on file  Social History Narrative   Not on file   Social Determinants of Health   Financial Resource Strain: Not on file  Food Insecurity: Not on file  Transportation Needs: Not on file  Physical Activity: Not on file  Stress: Not on file  Social Connections: Not on file  Intimate Partner Violence: Not on file   Family History  Problem Relation Age of Onset   Drug abuse Mother    Past Surgical History:  Procedure Laterality Date   CESAREAN SECTION N/A 09/08/2013  Procedure: CESAREAN SECTION;  Surgeon: Tereso Newcomer, MD;  Location: [facility];  Service: Obstetrics;  Laterality: N/A;   CESAREAN SECTION N/A 02/10/2018   Procedure: REPEAT CESAREAN SECTION;  Surgeon: Federico Flake, MD;  Location: [facility] BIRTHING SUIT[facility];  Service: Obstetrics;  Laterality: N/A;   TOOTH EXTRACTION     TUBAL LIGATION Bilateral 04/08/2018   Procedure: LAPAROSCOPIC BILATERAL TUBAL STERILIZATION WITH FALLOPE RING APPLICATION;  Surgeon: Tilda Burrow, MD;  Location: [facility] ORS;  Service: Gynecology;  Laterality: Bilateral;     Mardella Layman, MD 01/22/21 [place]

## 2021-01-22 NOTE — ED Triage Notes (Signed)
Cough and congestion x 2-3 days

## 2021-03-07 ENCOUNTER — Encounter: Payer: Self-pay | Admitting: Psychiatry

## 2021-03-07 ENCOUNTER — Inpatient Hospital Stay
Admission: RE | Admit: 2021-03-07 | Discharge: 2021-03-09 | DRG: 885 | Disposition: A | Discharge status: Home / Self Care | Payer: No Typology Code available for payment source | Source: Intra-hospital | Attending: Behavioral Health | Admitting: Behavioral Health

## 2021-03-07 ENCOUNTER — Emergency Department (EMERGENCY_DEPARTMENT_HOSPITAL)
Admission: EM | Admit: 2021-03-07 | Discharge: 2021-03-07 | Disposition: A | Discharge status: Home / Self Care | Payer: No Typology Code available for payment source | Source: Home / Self Care | Attending: Emergency Medicine | Admitting: Emergency Medicine

## 2021-03-07 ENCOUNTER — Other Ambulatory Visit: Payer: Self-pay

## 2021-03-07 DIAGNOSIS — F410 Panic disorder [episodic paroxysmal anxiety] without agoraphobia: Secondary | ICD-10-CM | POA: Diagnosis present

## 2021-03-07 DIAGNOSIS — F17210 Nicotine dependence, cigarettes, uncomplicated: Secondary | ICD-10-CM | POA: Insufficient documentation

## 2021-03-07 DIAGNOSIS — F1210 Cannabis abuse, uncomplicated: Secondary | ICD-10-CM

## 2021-03-07 DIAGNOSIS — F332 Major depressive disorder, recurrent severe without psychotic features: Principal | ICD-10-CM | POA: Diagnosis present

## 2021-03-07 DIAGNOSIS — F1510 Other stimulant abuse, uncomplicated: Secondary | ICD-10-CM

## 2021-03-07 DIAGNOSIS — F1310 Sedative, hypnotic or anxiolytic abuse, uncomplicated: Secondary | ICD-10-CM | POA: Diagnosis present

## 2021-03-07 DIAGNOSIS — Y900 Blood alcohol level of less than 20 mg/100 ml: Secondary | ICD-10-CM | POA: Insufficient documentation

## 2021-03-07 DIAGNOSIS — Z9152 Personal history of nonsuicidal self-harm: Secondary | ICD-10-CM

## 2021-03-07 DIAGNOSIS — X789XXD Intentional self-harm by unspecified sharp object, subsequent encounter: Secondary | ICD-10-CM | POA: Diagnosis present

## 2021-03-07 DIAGNOSIS — F909 Attention-deficit hyperactivity disorder, unspecified type: Secondary | ICD-10-CM | POA: Diagnosis present

## 2021-03-07 DIAGNOSIS — Z813 Family history of other psychoactive substance abuse and dependence: Secondary | ICD-10-CM | POA: Diagnosis not present

## 2021-03-07 DIAGNOSIS — S61512A Laceration without foreign body of left wrist, initial encounter: Secondary | ICD-10-CM

## 2021-03-07 DIAGNOSIS — S61512D Laceration without foreign body of left wrist, subsequent encounter: Secondary | ICD-10-CM

## 2021-03-07 DIAGNOSIS — Z20822 Contact with and (suspected) exposure to covid-19: Secondary | ICD-10-CM | POA: Diagnosis present

## 2021-03-07 DIAGNOSIS — X789XXA Intentional self-harm by unspecified sharp object, initial encounter: Secondary | ICD-10-CM

## 2021-03-07 DIAGNOSIS — Z79899 Other long term (current) drug therapy: Secondary | ICD-10-CM

## 2021-03-07 DIAGNOSIS — R45851 Suicidal ideations: Secondary | ICD-10-CM | POA: Insufficient documentation

## 2021-03-07 DIAGNOSIS — G4700 Insomnia, unspecified: Secondary | ICD-10-CM | POA: Diagnosis present

## 2021-03-07 HISTORY — DX: Laceration without foreign body of left wrist, initial encounter: S61.512A

## 2021-03-07 HISTORY — DX: Other stimulant abuse, uncomplicated: F15.10

## 2021-03-07 HISTORY — DX: Cannabis abuse, uncomplicated: F12.10

## 2021-03-07 HISTORY — DX: Intentional self-harm by unspecified sharp object, initial encounter: X78.9XXA

## 2021-03-07 HISTORY — DX: Sedative, hypnotic or anxiolytic abuse, uncomplicated: F13.10

## 2021-03-07 HISTORY — DX: Major depressive disorder, recurrent severe without psychotic features: F33.2

## 2021-03-07 LAB — COMPREHENSIVE METABOLIC PANEL
ALT: 15 U/L (ref 0–44)
AST: 18 U/L (ref 15–41)
Albumin: 4.8 g/dL (ref 3.5–5.0)
Alkaline Phosphatase: 45 U/L (ref 38–126)
Anion gap: 11 (ref 5–15)
BUN: 13 mg/dL (ref 6–20)
CO2: 21 mmol/L — ABNORMAL LOW (ref 22–32)
Calcium: 9.7 mg/dL (ref 8.9–10.3)
Chloride: 107 mmol/L (ref 98–111)
Creatinine, Ser: 0.81 mg/dL (ref 0.44–1.00)
GFR, Estimated: >60 mL/min (ref >60)
Glucose, Bld: 96 mg/dL (ref 70–99)
Potassium: 3.5 mmol/L (ref 3.5–5.1)
Sodium: 139 mmol/L (ref 135–145)
Total Bilirubin: 1.3 mg/dL — ABNORMAL HIGH (ref 0.3–1.2)
Total Protein: 8 g/dL (ref 6.5–8.1)

## 2021-03-07 LAB — CBC
HCT: 42.5 % (ref 36.0–46.0)
Hemoglobin: 14.8 g/dL (ref 12.0–15.0)
MCH: 29.8 pg (ref 26.0–34.0)
MCHC: 34.8 g/dL (ref 30.0–36.0)
MCV: 85.5 fL (ref 80.0–100.0)
Platelets: 260 10*3/uL (ref 150–400)
RBC: 4.97 MIL/uL (ref 3.87–5.11)
RDW: 12.5 % (ref 11.5–15.5)
WBC: 6.8 10*3/uL (ref 4.0–10.5)
nRBC: 0 % (ref 0.0–0.2)

## 2021-03-07 LAB — URINE DRUG SCREEN, QUALITATIVE (ARMC ONLY)
Amphetamines, Ur Screen: POSITIVE — AB
Barbiturates, Ur Screen: NOT DETECTED
Benzodiazepine, Ur Scrn: POSITIVE — AB
Cannabinoid 50 Ng, Ur ~~LOC~~: POSITIVE — AB
Cocaine Metabolite,Ur ~~LOC~~: NOT DETECTED
MDMA (Ecstasy)Ur Screen: NOT DETECTED
Methadone Scn, Ur: NOT DETECTED
Opiate, Ur Screen: NOT DETECTED
Phencyclidine (PCP) Ur S: NOT DETECTED
Tricyclic, Ur Screen: NOT DETECTED

## 2021-03-07 LAB — RESP PANEL BY RT-PCR (FLU A&B, COVID) ARPGX2
Influenza A by PCR: NEGATIVE
Influenza B by PCR: NEGATIVE
SARS Coronavirus 2 by RT PCR: NEGATIVE

## 2021-03-07 LAB — SALICYLATE LEVEL: Salicylate Lvl: <7 mg/dL — ABNORMAL LOW (ref 7.0–30.0)

## 2021-03-07 LAB — ACETAMINOPHEN LEVEL: Acetaminophen (Tylenol), Serum: <10 ug/mL — ABNORMAL LOW (ref 10–30)

## 2021-03-07 LAB — ETHANOL: Alcohol, Ethyl (B): <10 mg/dL (ref <10)

## 2021-03-07 LAB — POC URINE PREG, ED: Preg Test, Ur: NEGATIVE

## 2021-03-07 MED ORDER — PRAZOSIN HCL 2 MG PO CAPS
2.0000 mg | ORAL_CAPSULE | Freq: Every day | ORAL | Status: DC
  Administered 2021-03-08: 2 mg via ORAL
  Filled 2021-03-07 (×2): qty 1

## 2021-03-07 MED ORDER — QUETIAPINE FUMARATE 25 MG PO TABS
50.0000 mg | ORAL_TABLET | Freq: Four times a day (QID) | ORAL | Status: DC | PRN
  Administered 2021-03-07: 50 mg via ORAL
  Filled 2021-03-07: qty 2

## 2021-03-07 MED ORDER — LORAZEPAM 2 MG PO TABS
2.0000 mg | ORAL_TABLET | Freq: Once | ORAL | Status: AC
  Administered 2021-03-07: 2 mg via ORAL
  Filled 2021-03-07: qty 1

## 2021-03-07 MED ORDER — TRAZODONE HCL 100 MG PO TABS: 100.0000 mg | ORAL_TABLET | Freq: Every evening | ORAL | Status: DC | PRN

## 2021-03-07 MED ORDER — ALUM & MAG HYDROXIDE-SIMETH 200-200-20 MG/5ML PO SUSP
30.0000 mL | ORAL | Status: DC | PRN
  Filled 2021-03-07: qty 30

## 2021-03-07 MED ORDER — LORAZEPAM 1 MG PO TABS
1.0000 mg | ORAL_TABLET | Freq: Once | ORAL | Status: AC
  Administered 2021-03-07: 1 mg via ORAL
  Filled 2021-03-07: qty 1

## 2021-03-07 MED ORDER — HYDROXYZINE HCL 25 MG PO TABS
50.0000 mg | ORAL_TABLET | Freq: Four times a day (QID) | ORAL | Status: DC | PRN
  Filled 2021-03-07: qty 2

## 2021-03-07 MED ORDER — ACETAMINOPHEN 325 MG PO TABS
650.0000 mg | ORAL_TABLET | Freq: Four times a day (QID) | ORAL | Status: DC | PRN
  Administered 2021-03-08: 650 mg via ORAL
  Filled 2021-03-07: qty 2

## 2021-03-07 MED ORDER — MAGNESIUM HYDROXIDE 400 MG/5ML PO SUSP: 30.0000 mL | Freq: Every day | ORAL | Status: DC | PRN

## 2021-03-07 NOTE — ED Notes (Signed)
Report called to matt rn beh unit

## 2021-03-07 NOTE — Progress Notes (Signed)
Patient admitted from [facility] - ED, report received from Amy, RN. Patient presents with anxiety and endorses anxiety. Patient denies SI/HI/AVH. Patient states she has racing thoughts. Patient given education and support. Patient oriented to the unit and her room. Pt skin assessment completed with Cleo, patient has superficial cuts on her right wrist. Pt also has a tattoo on her right wrist. Patient being monitored Q 15 minutes for safety per unit protocol. Pt remains safe on the unit.
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
## 2021-03-07 NOTE — ED Notes (Addendum)
Pt given paper and crayons. Three crayons counted in box and will be checked after pt uses to ensure all crayons are returned. Will continue to monitor.

## 2021-03-07 NOTE — ED Notes (Signed)
Patient is to be admitted to [facility] by Dr. Toni Amend.  Attending Physician will be Dr. Neale Burly.   Patient has been assigned to room 302, by [facility] Charge Nurse [place].   Intake Paper Work has been signed and placed on patient chart.  ER staff is aware of the admission: [facility] ER Secretary   Dr. Larinda Buttery, ER MD  Amy Patient's Nurse  Ethelene Browns Patient Access.

## 2021-03-07 NOTE — Consult Note (Signed)
[facility] Face-to-Face Psychiatry Consult   Reason for Consult: Consult for 30 year old woman who came to the emergency room voluntarily complaining of multiple symptoms consistent with anxiety and depression as well as self-inflicted wound Referring Physician: Su Hoff Patient Identification: Jessica [name] MRN:  [id number] Principal Diagnosis: Severe recurrent major depression without psychotic features (HCC) Diagnosis:  Principal Problem:   Severe recurrent major depression without psychotic features (HCC) Active Problems:   Amphetamine abuse (HCC)   Benzodiazepine abuse (HCC)   Cannabis abuse   Self-inflicted laceration of left wrist (HCC)   Total Time spent with patient: 1 hour  Subjective:   Jessica [name] is a 29 y.o. female patient admitted with "I cannot sleep I cannot eat I feel bad".  HPI: Patient seen chart reviewed.  30 year old woman came voluntarily to the emergency room.  Also evidently spoke with [facility] today.  Patient says for the last 3 days she has been unable to sleep.  She is feeling anxious like she is having a panic attack almost all the time.  She describes her mood as depressed and stressed out for the last couple weeks.  Not eating well for the last few days.  Cut herself on the left inner forearm which she said she did 2 days ago.  Patient says she feels stressed out like there are lots of things going on in her mind.  I asked her how things were at home and what her relationship was like with her family and she reports that all of that is very good.  Denies any family problems.  In fact she could not articulate any specific life problem that is leading to her stress.  Claims that it just came on out of nowhere.  Patient says she is not currently taking any medicine but claims that in the past she had been prescribed Xanax and Vyvanse.  She asked several times if we will be sure to get her the medicine that she really needs when she comes into the hospital.  She says she has  suicidal thoughts but does not have a specific plan.  Denies homicidal ideation.  Denies hallucinations or psychosis.  Patient says she is not drinking any alcohol and denies using any other kind of drugs anytime recently.  Past Psychiatric History: Patient says she had a hospitalization around age 33 at [facility] behavioral health.  She says at that time she was also cutting herself.  Patient claims that she had been prescribed Vyvanse and Xanax by someone named Erskine Speed as an outpatient.  I checked the controlled substance database and indeed there are a couple of prescriptions of alprazolam from that person but it does not look like it was a long-term consistent prescription.  I cannot find any record of her ever being prescribed Vyvanse.  No record of any stimulants in fact.  What I do see is evidence of a lot of doctor shopping going around getting small prescriptions for narcotics and benzodiazepines from various providers.  Risk to Self:   Risk to Others:   Prior Inpatient Therapy:   Prior Outpatient Therapy:    Past Medical History:  Past Medical History:  Diagnosis Date   Abnormal Pap smear    ADHD (attention deficit hyperactivity disorder)    Anxiety    Chronic back pain    Depression    Migraine headache    UTI (lower urinary tract infection) 01/12/2013   Vaginal Pap smear, abnormal     Past Surgical History:  Procedure  Laterality Date   CESAREAN SECTION N/A 09/08/2013   Procedure: CESAREAN SECTION;  Surgeon: Tereso Newcomer, MD;  Location: [facility];  Service: Obstetrics;  Laterality: N/A;   CESAREAN SECTION N/A 02/10/2018   Procedure: REPEAT CESAREAN SECTION;  Surgeon: Federico Flake, MD;  Location: [facility] BIRTHING SUITES;  Service: Obstetrics;  Laterality: N/A;   TOOTH EXTRACTION     TUBAL LIGATION Bilateral 04/08/2018   Procedure: LAPAROSCOPIC BILATERAL TUBAL STERILIZATION WITH FALLOPE RING APPLICATION;  Surgeon: Tilda Burrow, MD;  Location: [facility];  Service:  Gynecology;  Laterality: Bilateral;   Family History:  Family History  Problem Relation Age of Onset   Drug abuse Mother    Family Psychiatric  History: Denies knowing of any Social History:  Social History   Substance and Sexual Activity  Alcohol Use Yes     Social History   Substance and Sexual Activity  Drug Use No    Social History   Socioeconomic History   Marital status: Married    Spouse name: Not on file   Number of children: 2   Years of education: Not on file   Highest education level: Not on file  Occupational History   Not on file  Tobacco Use   Smoking status: Some Days    Packs/day: 1.00    Types: Cigarettes   Smokeless tobacco: Never  Vaping Use   Vaping Use: Never used  Substance and Sexual Activity   Alcohol use: Yes   Drug use: No   Sexual activity: Not Currently    Birth control/protection: Surgical    Comment: tubal  Other Topics Concern   Not on file  Social History Narrative   Not on file   Social Determinants of Health   Financial Resource Strain: Not on file  Food Insecurity: Not on file  Transportation Needs: Not on file  Physical Activity: Not on file  Stress: Not on file  Social Connections: Not on file   Additional Social History:    Allergies:   Allergies  Allergen Reactions   Atarax [Hydroxyzine] Other (See Comments)    "makes me crazy"   Trazodone And Nefazodone Other (See Comments)    Has weird dreams    Labs:  Results for orders placed or performed during the hospital encounter of 03/07/21 (from the past 48 hour(s))  Comprehensive metabolic panel     Status: Abnormal   Collection Time: 03/07/21  2:50 PM  Result Value Ref Range   Sodium 139 135 - 145 mmol/L   Potassium 3.5 3.5 - 5.1 mmol/L   Chloride 107 98 - 111 mmol/L   CO2 21 (L) 22 - 32 mmol/L   Glucose, Bld 96 70 - 99 mg/dL    Comment: Glucose reference range applies only to samples taken after fasting for at least 8 hours.   BUN 13 6 - 20 mg/dL    Creatinine, Ser 5.28 0.44 - 1.00 mg/dL   Calcium 9.7 8.9 - 41.3 mg/dL   Total Protein 8.0 6.5 - 8.1 g/dL   Albumin 4.8 3.5 - 5.0 g/dL   AST 18 15 - 41 U/L   ALT 15 0 - 44 U/L   Alkaline Phosphatase 45 38 - 126 U/L   Total Bilirubin 1.3 (H) 0.3 - 1.2 mg/dL   GFR, Estimated >24 >40 mL/min    Comment: (NOTE) Calculated using the CKD-EPI Creatinine Equation (2021)    Anion gap 11 5 - 15    Comment: Performed at [facility], [address] [place], [place] [address]  Ethanol     Status: None   Collection Time: 03/07/21  2:50 PM  Result Value Ref Range   Alcohol, Ethyl (B) <10 <10 mg/dL    Comment: (NOTE) Lowest detectable limit for serum alcohol is 10 mg/dL.  For medical purposes only. Performed at [facility], [address] [place], [place] [address]   Salicylate level     Status: Abnormal   Collection Time: 03/07/21  2:50 PM  Result Value Ref Range   Salicylate Lvl <7.0 (L) 7.0 - 30.0 mg/dL    Comment: Performed at [facility], [address] [place], [place] [address]  Acetaminophen level     Status: Abnormal   Collection Time: 03/07/21  2:50 PM  Result Value Ref Range   Acetaminophen (Tylenol), Serum <10 (L) 10 - 30 ug/mL    Comment: (NOTE) Therapeutic concentrations vary significantly. A range of 10-30 ug/mL  may be an effective concentration for many patients. However, some  are best treated at concentrations outside of this range. Acetaminophen concentrations >150 ug/mL at 4 hours after ingestion  and >50 ug/mL at 12 hours after ingestion are often associated with  toxic reactions.  Performed at [facility], [address] [place], [place] [address]   cbc     Status: None   Collection Time: 03/07/21  2:50 PM  Result Value Ref Range   WBC 6.8 4.0 - 10.5 K/uL   RBC 4.97 3.87 - 5.11 MIL/uL   Hemoglobin 14.8 12.0 - 15.0 g/dL   HCT 86.542.5 78.436.0 - 69.646.0 %   MCV 85.5 80.0 - 100.0 fL   MCH 29.8 26.0 - 34.0 pg   MCHC 34.8  30.0 - 36.0 g/dL   RDW 29.512.5 28.411.5 - 13.215.5 %   Platelets 260 150 - 400 K/uL   nRBC 0.0 0.0 - 0.2 %    Comment: Performed at [facility], [address] [place], [place] [address]  Urine Drug Screen, Qualitative     Status: Abnormal   Collection Time: 03/07/21  2:50 PM  Result Value Ref Range   Tricyclic, Ur Screen NONE DETECTED NONE DETECTED   Amphetamines, Ur Screen POSITIVE (A) NONE DETECTED   MDMA (Ecstasy)Ur Screen NONE DETECTED NONE DETECTED   Cocaine Metabolite,Ur [place] NONE DETECTED NONE DETECTED   Opiate, Ur Screen NONE DETECTED NONE DETECTED   Phencyclidine (PCP) Ur S NONE DETECTED NONE DETECTED   Cannabinoid 50 Ng, Ur [place] POSITIVE (A) NONE DETECTED   Barbiturates, Ur Screen NONE DETECTED NONE DETECTED   Benzodiazepine, Ur Scrn POSITIVE (A) NONE DETECTED   Methadone Scn, Ur NONE DETECTED NONE DETECTED    Comment: (NOTE) Tricyclics + metabolites, urine    Cutoff 1000 ng/mL Amphetamines + metabolites, urine  Cutoff 1000 ng/mL MDMA (Ecstasy), urine              Cutoff 500 ng/mL Cocaine Metabolite, urine          Cutoff 300 ng/mL Opiate + metabolites, urine        Cutoff 300 ng/mL Phencyclidine (PCP), urine         Cutoff 25 ng/mL Cannabinoid, urine                 Cutoff 50 ng/mL Barbiturates + metabolites, urine  Cutoff 200 ng/mL Benzodiazepine, urine              Cutoff 200 ng/mL Methadone, urine  Cutoff 300 ng/mL  The urine drug screen provides only a preliminary, unconfirmed analytical test result and should not be used for non-medical purposes. Clinical consideration and professional judgment should be applied to any positive drug screen result due to possible interfering substances. A more specific alternate chemical method must be used in order to obtain a confirmed analytical result. Gas chromatography / mass spectrometry (GC/MS) is the preferred confirm atory method. Performed at [facility], [address] [place], [place]  [address]   POC urine preg, ED     Status: None   Collection Time: 03/07/21  2:59 PM  Result Value Ref Range   Preg Test, Ur NEGATIVE NEGATIVE    Comment:        THE SENSITIVITY OF THIS METHODOLOGY IS >24 mIU/mL     No current facility-administered medications for this encounter.   Current Outpatient Medications  Medication Sig Dispense Refill   albuterol (VENTOLIN HFA) 108 (90 Base) MCG/ACT inhaler Inhale 1-2 puffs into the lungs every 6 (six) hours as needed for wheezing or shortness of breath. 18 g 0   fluticasone (FLONASE) 50 MCG/ACT nasal spray Place 2 sprays into both nostrils daily. 16 g 0   guaiFENesin-codeine 100-10 MG/5ML syrup Take 5 mLs by mouth 3 (three) times daily as needed for cough. 90 mL 0   ibuprofen (ADVIL) 800 MG tablet Take 1 tablet (800 mg total) by mouth every 8 (eight) hours as needed for moderate pain. 21 tablet 0   LORazepam (ATIVAN) 0.5 MG tablet Take 1 tablet (0.5 mg total) by mouth every 8 (eight) hours as needed for anxiety. (Patient not taking: No sig reported) 30 tablet 0   oxyCODONE-acetaminophen (PERCOCET) 5-325 MG tablet Take 1 tablet by mouth every 6 (six) hours as needed. 20 tablet 0    Musculoskeletal: Strength & Muscle Tone: within normal limits Gait & Station: normal Patient leans: N/A            Psychiatric Specialty Exam:  Presentation  General Appearance: Appropriate for Environment  Eye Contact:Good  Speech:Clear and Coherent  Speech Volume:Normal  Handedness: No data recorded  Mood and Affect  Mood:Anxious  Affect:Congruent   Thought Process  Thought Processes:Coherent  Descriptions of Associations:Intact  Orientation:Full (Time, Place and Person)  Thought Content:Logical  History of Schizophrenia/Schizoaffective disorder:No data recorded Duration of Psychotic Symptoms:No data recorded Hallucinations:No data recorded Ideas of Reference:None  Suicidal Thoughts:No data recorded Homicidal Thoughts:No data  recorded  Sensorium  Memory:Immediate Good; Remote Good  Judgment:Good  Insight:Good   Executive Functions  Concentration:Fair  Attention Span:Fair  Recall:Good  Fund of Knowledge:Good  Language:Good   Psychomotor Activity  Psychomotor Activity: No data recorded  Assets  Assets:Desire for Improvement; Social Support   Sleep  Sleep: No data recorded  Physical Exam: Physical Exam Vitals and nursing note reviewed.  Constitutional:      Appearance: Normal appearance.  HENT:     Head: Normocephalic and atraumatic.     Mouth/Throat:     Pharynx: Oropharynx is clear.  Eyes:     Pupils: Pupils are equal, round, and reactive to light.  Cardiovascular:     Rate and Rhythm: Normal rate and regular rhythm.  Pulmonary:     Effort: Pulmonary effort is normal.     Breath sounds: Normal breath sounds.  Abdominal:     General: Abdomen is flat.     Palpations: Abdomen is soft.  Musculoskeletal:        General: Normal range of motion.  Skin:  General: Skin is warm and dry.       Neurological:     General: No focal deficit present.     Mental Status: She is alert. Mental status is at baseline.  Psychiatric:        Attention and Perception: Attention normal.        Mood and Affect: Mood is anxious. Affect is blunt.        Speech: Speech is delayed.        Behavior: Behavior is cooperative.        Thought Content: Thought content includes suicidal ideation. Thought content does not include suicidal plan.        Cognition and Memory: Cognition normal.        Judgment: Judgment is impulsive.   Review of Systems  Constitutional: Negative.   HENT: Negative.    Eyes: Negative.   Respiratory: Negative.    Cardiovascular: Negative.   Gastrointestinal: Negative.   Musculoskeletal: Negative.   Skin: Negative.   Neurological: Negative.   Psychiatric/Behavioral:  Positive for depression and suicidal ideas. Negative for hallucinations and substance abuse. The patient  is nervous/anxious and has insomnia.   Blood pressure 121/73, pulse 66, temperature 98.2 F (36.8 C), temperature source Oral, resp. rate 18, last menstrual period 02/08/2021, SpO2 100 %. There is no height or weight on file to calculate BMI.  Treatment Plan Summary: Medication management and Plan 30 year old woman comes to the hospital reporting multiple symptoms possibly consistent with depression including depressed mood inability to concentrate, difficulty sleeping and eating for several days and cutting herself on the forearm.  She presents looking very anxious.  Patient denies any substance abuse at all.  Her drug screen is positive for amphetamines benzodiazepines and cannabis.  Review of the controlled substance database suggests a tendency to seek out specific narcotics and benzodiazepines.  Patient does not need specific treatment to the cuts on her forearm.  Labs unremarkable.  COVID test has been ordered.  If that comes back negative we will plan on admission to the inpatient psychiatric unit pending approval from the inpatient treatment team.  Disposition: Recommend psychiatric Inpatient admission when medically cleared. Supportive therapy provided about ongoing stressors.  Mordecai Rasmussen, MD 03/07/2021 4:42 PM

## 2021-03-07 NOTE — Plan of Care (Signed)
Patient new to the unit tonight, hasn't had time to progress  Problem: Education: Goal: Knowledge of [facility] Education information/materials will improve Outcome: Not Progressing Goal: Emotional status will improve Outcome: Not Progressing Goal: Mental status will improve Outcome: Not Progressing Goal: Verbalization of understanding the information provided will improve Outcome: Not Progressing   Problem: Education: Goal: Ability to state activities that reduce stress will improve Outcome: Not Progressing   Problem: Coping: Goal: Ability to identify and develop effective coping behavior will improve Outcome: Not Progressing

## 2021-03-07 NOTE — ED Triage Notes (Signed)
Pt states she is having SI thoughts, pt has superficial lacerations to the left anterior FA. Pt is anxious and asking for something to help calm her. Pt is cooperative at this time.states her friend dropped her off

## 2021-03-07 NOTE — Progress Notes (Signed)
Patient irritable and refused her scheduled night medication, (Check MAR). Patient give education and support, Pt still refused.

## 2021-03-07 NOTE — ED Notes (Signed)
VOL, pending consult 
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
## 2021-03-07 NOTE — ED Notes (Signed)
meds given.  Pt waiting on admission bed.  

## 2021-03-07 NOTE — [facility] Group Notes (Signed)
[facility] Group Notes:  (Nursing/MHT/Case Management/Adjunct)  Date:  03/07/2021  Time:  10:02 PM  Type of Therapy:  Group Therapy  Participation Level:  Active  Participation Quality:  Appropriate  Affect:  Appropriate  Cognitive:  Alert  Insight:  Good  Engagement in Group:  Engaged  Modes of Intervention:  Support and goal is to be happy and get back to her kids.  Summary of Progress/Problems:  Jessica [name] 03/07/2021, 10:02 PM

## 2021-03-07 NOTE — Tx Team (Signed)
Initial Treatment Plan 03/07/2021 8:24 PM Jessica [name] [id number]:[id number]    PATIENT STRESSORS: Medication change or noncompliance Substance abuse   PATIENT STRENGTHS: Ability for insight Motivation for treatment/growth   PATIENT IDENTIFIED PROBLEMS: Anxiety  Depression                   DISCHARGE CRITERIA:  Improved stabilization in mood, thinking, and/or behavior Motivation to continue treatment in a less acute level of care  PRELIMINARY DISCHARGE PLAN: Outpatient therapy Return to previous living arrangement  PATIENT/FAMILY INVOLVEMENT: This treatment plan has been presented to and reviewed with the patient, Jessica [name]. The patient has been given the opportunity to ask questions and make suggestions.  Elmyra Ricks, RN 03/07/2021, 8:24 PM

## 2021-03-07 NOTE — ED Notes (Signed)
IVC/  PENDING  PLACEMENT 

## 2021-03-07 NOTE — ED Notes (Signed)
Pair of shoes. 1 shirt. 1 short. 1 bra. 1 cell phone. 1 pink wallet.

## 2021-03-08 ENCOUNTER — Encounter: Payer: Self-pay | Admitting: Psychiatry

## 2021-03-08 DIAGNOSIS — F332 Major depressive disorder, recurrent severe without psychotic features: Secondary | ICD-10-CM | POA: Diagnosis not present

## 2021-03-08 MED ORDER — ATOMOXETINE HCL 40 MG PO CAPS
40.0000 mg | ORAL_CAPSULE | Freq: Every day | ORAL | Status: DC
  Administered 2021-03-08 – 2021-03-09 (×2): 40 mg via ORAL
  Filled 2021-03-08 (×2): qty 4
  Filled 2021-03-08: qty 1

## 2021-03-08 MED ORDER — NICOTINE 14 MG/24HR TD PT24
14.0000 mg | MEDICATED_PATCH | Freq: Every day | TRANSDERMAL | Status: DC
  Filled 2021-03-08: qty 1

## 2021-03-08 MED ORDER — ZIPRASIDONE MESYLATE 20 MG IM SOLR
20.0000 mg | Freq: Four times a day (QID) | INTRAMUSCULAR | Status: DC | PRN
  Administered 2021-03-09: 20 mg via INTRAMUSCULAR
  Filled 2021-03-08: qty 20

## 2021-03-08 MED ORDER — HYDROXYZINE HCL 50 MG PO TABS
50.0000 mg | ORAL_TABLET | Freq: Three times a day (TID) | ORAL | Status: DC | PRN
  Administered 2021-03-09: 50 mg via ORAL
  Filled 2021-03-08: qty 1

## 2021-03-08 MED ORDER — NICOTINE POLACRILEX 2 MG MT GUM
2.0000 mg | CHEWING_GUM | OROMUCOSAL | Status: DC | PRN
  Administered 2021-03-08: 2 mg via ORAL
  Filled 2021-03-08 (×2): qty 1

## 2021-03-08 MED ORDER — NICOTINE 21 MG/24HR TD PT24
21.0000 mg | MEDICATED_PATCH | Freq: Every day | TRANSDERMAL | Status: DC
  Administered 2021-03-08: 21 mg via TRANSDERMAL
  Filled 2021-03-08: qty 1

## 2021-03-08 MED ORDER — MIRTAZAPINE 15 MG PO TABS
15.0000 mg | ORAL_TABLET | Freq: Every day | ORAL | Status: DC
  Administered 2021-03-08: 15 mg via ORAL
  Filled 2021-03-08: qty 1

## 2021-03-08 MED ORDER — OLANZAPINE 10 MG PO TABS
10.0000 mg | ORAL_TABLET | Freq: Four times a day (QID) | ORAL | Status: DC | PRN
  Administered 2021-03-08: 10 mg via ORAL
  Filled 2021-03-08: qty 1

## 2021-03-08 NOTE — Progress Notes (Addendum)
D: Pt alert and oriented. Pt rates depression 10/10 and anxiety 10/10. Pt reports experiencing 9/10 chronic lower back pain, prn meds offered and refused by pt. Pt denies experiencing any SI/HI, or AVH at this time.   Pt is irritable, argumentative, and belligerent. Pt has been slamming doors this morning as well as the phone. Pt has "cussing" person on phone out and is being loud. After being told she would be discharged today pt became tearful stating that she wanted to stay. Pt has no where to go.  Pt has turned attitude around this afternoon since the incident. Pt has been calm, cooperative, and med complaint.  A: Scheduled medications administered to pt, per MD orders. Support and encouragement provided. Frequent verbal contact made. Routine safety checks conducted q15 minutes.   R: No adverse drug reactions noted. Pt verbally contracts for safety at this time. Pt interacts poorly with others on the unit for first part of the day. Pt remains safe at this time. Will continue to monitor.

## 2021-03-08 NOTE — [facility] Counselor (Signed)
Jessica [name], patient, provided written consent for CSW team to coordinate aftercare at this time. CSW to have further conversations with patient regarding care in the community.   Additionally, Patient provided written consent for CSW team to reach [name], grandmother, @ [phone]. Patient was addiment that she wanted writer to talk to no one else. CSW assured they would not reach anyone she did not consent to.    Signed:  Corky Crafts, MSW, LCSWA, LCASA 03/08/2021 11:00 AM

## 2021-03-08 NOTE — [facility] Group Notes (Signed)
LCSW Group Therapy Note     03/08/2021 2:37 PM  Type of Therapy and Topic:  Group Therapy:  Feelings around Relapse and Recovery  Participation Level:  Did Not Attend  Description of Group:   Patients in this group will discuss emotions they experience before and after a relapse. They will process how experiencing these feelings, or avoidance of experiencing them, relates to having a relapse. Facilitator will guide patients to explore emotions they have related to recovery. Patients will be encouraged to process which emotions are more powerful. They will be guided to discuss the emotional reaction significant others in their lives may have to their relapse or recovery. Patients will be assisted in exploring ways to respond to the emotions of others without this contributing to a relapse.     Therapeutic Goals:  1.    Patient will identify two or more emotions that lead to a relapse for them 2.    Patient will identify two emotions that result when they relapse 3.    Patient will identify two emotions related to recovery 4.    Patient will demonstrate ability to communicate their needs through discussion and/or role plays    Summary of Patient Progress: Patient did not attend group despite encouraged participation.     Therapeutic Modalities:  Cognitive Behavioral Therapy Solution-Focused Therapy Assertiveness Training Relapse Prevention Therapy    Gwenevere Ghazi, LCSW, LCAS-A  03/08/2021 2:37 PM

## 2021-03-08 NOTE — [facility] Suicide Risk Assessment (Addendum)
[facility] INPATIENT:  Family/Significant Other Suicide Prevention Education  Suicide Prevention Education:  Education Completed; [name],  Venetia Maxon) @ [phone] has been identified by the patient as the family member/significant other with whom the patient will be residing, and identified as the person(s) who will aid the patient in the event of a mental health crisis (suicidal ideations/suicide attempt).  With written consent from the patient, the family member/significant other has been provided the following suicide prevention education, prior to the and/or following the discharge of the patient.  The suicide prevention education provided includes the following: Suicide risk factors Suicide prevention and interventions National Suicide Hotline telephone number [facility] assessment telephone number [facility] Emergency Assistance [facility] and/or Residential Mobile Crisis Unit telephone number  Request made of family/significant other to: Remove weapons (e.g., guns, rifles, knives), all items previously/currently identified as safety concern.   Remove drugs/medications (over-the-counter, prescriptions, illicit drugs), all items previously/currently identified as a safety concern.  The family member/significant other verbalizes understanding of the suicide prevention education information provided.  The family member/significant other agrees to remove the items of safety concern listed above.  Collateral contact reports that she has some concerns for safety, states patient will make statements like "I am going to kill myself" without mentioning plan or intent. Additionally, she reports there are firearms at the patients place of residence though she does not know if she has access to them. CSW to follow up with patient regarding weapons in the house. [place], it was discovered that both of the patient's parents are deceased, mother by OD and father by  auto accident. Patient's great grandmother reports that patient could significantly benefit from psychotherapy.   Corky Crafts 03/08/2021, 12:25 PM

## 2021-03-08 NOTE — Progress Notes (Signed)
Recreation Therapy Notes  INPATIENT RECREATION THERAPY ASSESSMENT  Patient Details Name: Jessica [name] MRN: [id number] DOB: 16-Feb-1991 Today's Date: 03/08/2021       Information Obtained From: Patient  Able to Participate in Assessment/Interview: Yes  Patient Presentation: Responsive, Confused, Withdrawn, Resistant  Reason for Admission (Per Patient): Active Symptoms, Suicidal Ideation  Patient Stressors:    Coping Skills:   Other (Comment) (Medication)  Leisure Interests (2+):  Sports - Swimming, Nature - Architect, Technical brewer - Ambulance person of Recreation/Participation: Monthly  Awareness of Community Resources:  Yes  Community Resources:  Restaurants, Park, [place], Recreation Center  Current Use: No  If no, Barriers?: Attitudinal  Expressed Interest in [employer] Information: No  County of Residence:  [place]  Patient Main Form of Transportation: Set designer  Patient Strengths:  N/A  Patient Identified Areas of Improvement:  N/A  Patient Goal for Hospitalization:  Get better and get the right medication  Current SI (including self-harm):  No  Current HI:  No  Current AVH: No  Staff Intervention Plan: Group Attendance, Collaborate with Interdisciplinary Treatment Team  Consent to Intern Participation: N/A  Aggie Douse 03/08/2021, 2:39 PM

## 2021-03-08 NOTE — Progress Notes (Signed)
Recreation Therapy Notes  INPATIENT RECREATION TR PLAN  Patient Details Name: Jessica [name] MRN: [id number] DOB: November 09, 1990 Today's Date: 03/08/2021  Rec Therapy Plan Is patient appropriate for Therapeutic Recreation?: Yes Treatment times per week: at least 3 Estimated Length of Stay: 5-7 days TR Treatment/Interventions: Group participation (Comment)  Discharge Criteria Pt will be discharged from therapy if:: Discharged Treatment plan/goals/alternatives discussed and agreed upon by:: Patient/family  Discharge Summary     [name] 03/08/2021, 2:40 PM

## 2021-03-08 NOTE — Progress Notes (Signed)
Recreation Therapy Notes   Date: 03/08/2021  Time: 10:00 am   Location: Craft room  Behavioral response: N/A   Intervention Topic: Creative expressions   Discussion/Intervention: Patient did not attend group.   Clinical Observations/Feedback:  Patient did not attend group.   Edelmiro Innocent LRT/CTRS        Avory Mimbs 03/08/2021 11:40 AM

## 2021-03-08 NOTE — Tx Team (Addendum)
Interdisciplinary Treatment and Diagnostic Plan Update  03/08/2021 Time of Session: 9:00AM Jessica [name] MRN: [id number]  Principal Diagnosis: <principal problem not specified>  Secondary Diagnoses: Active Problems:   Severe recurrent major depression without psychotic features (HCC)   Current Medications:  Current Facility-Administered Medications  Medication Dose Route Frequency Provider Last Rate Last Admin   acetaminophen (TYLENOL) tablet 650 mg  650 mg Oral Q6H PRN Clapacs, Jackquline Denmark, MD       alum & mag hydroxide-simeth (MAALOX/MYLANTA) 200-200-20 MG/5ML suspension 30 mL  30 mL Oral Q4H PRN Clapacs, Jackquline Denmark, MD       magnesium hydroxide (MILK OF MAGNESIA) suspension 30 mL  30 mL Oral Daily PRN Clapacs, Jackquline Denmark, MD       prazosin (MINIPRESS) capsule 2 mg  2 mg Oral QHS Gillermo Murdoch, NP       PTA Medications: Medications Prior to Admission  Medication Sig Dispense Refill Last Dose   albuterol (VENTOLIN HFA) 108 (90 Base) MCG/ACT inhaler Inhale 1-2 puffs into the lungs every 6 (six) hours as needed for wheezing or shortness of breath. 18 g 0    fluticasone (FLONASE) 50 MCG/ACT nasal spray Place 2 sprays into both nostrils daily. 16 g 0    guaiFENesin-codeine 100-10 MG/5ML syrup Take 5 mLs by mouth 3 (three) times daily as needed for cough. 90 mL 0    ibuprofen (ADVIL) 800 MG tablet Take 1 tablet (800 mg total) by mouth every 8 (eight) hours as needed for moderate pain. 21 tablet 0    LORazepam (ATIVAN) 0.5 MG tablet Take 1 tablet (0.5 mg total) by mouth every 8 (eight) hours as needed for anxiety. (Patient not taking: No sig reported) 30 tablet 0    oxyCODONE-acetaminophen (PERCOCET) 5-325 MG tablet Take 1 tablet by mouth every 6 (six) hours as needed. 20 tablet 0     Patient Stressors: Medication change or noncompliance Substance abuse  Patient Strengths: Ability for insight Motivation for treatment/growth  Treatment Modalities: Medication Management, Group therapy, Case  management,  1 to 1 session with clinician, Psychoeducation, Recreational therapy.   Physician Treatment Plan for Primary Diagnosis: <principal problem not specified> Long Term Goal(s):     Short Term Goals:    Medication Management: Evaluate patient's response, side effects, and tolerance of medication regimen.  Therapeutic Interventions: 1 to 1 sessions, Unit Group sessions and Medication administration.  Evaluation of Outcomes: Adequate for Discharge  Physician Treatment Plan for Secondary Diagnosis: Active Problems:   Severe recurrent major depression without psychotic features (HCC)  Long Term Goal(s):     Short Term Goals:       Medication Management: Evaluate patient's response, side effects, and tolerance of medication regimen.  Therapeutic Interventions: 1 to 1 sessions, Unit Group sessions and Medication administration.  Evaluation of Outcomes: Adequate for Discharge   RN Treatment Plan for Primary Diagnosis: <principal problem not specified> Long Term Goal(s): Knowledge of disease and therapeutic regimen to maintain health will improve  Short Term Goals: Ability to demonstrate self-control, Ability to participate in decision making will improve, Ability to verbalize feelings will improve, Ability to disclose and discuss suicidal ideas, Ability to identify and develop effective coping behaviors will improve, and Compliance with prescribed medications will improve  Medication Management: RN will administer medications as ordered by provider, will assess and evaluate patient's response and provide education to patient for prescribed medication. RN will report any adverse and/or side effects to prescribing provider.  Therapeutic Interventions: 1 on 1 counseling sessions,  Psychoeducation, Medication administration, Evaluate responses to treatment, Monitor vital signs and CBGs as ordered, Perform/monitor CIWA, COWS, AIMS and Fall Risk screenings as ordered, Perform wound care  treatments as ordered.  Evaluation of Outcomes: Adequate for Discharge   LCSW Treatment Plan for Primary Diagnosis: <principal problem not specified> Long Term Goal(s): Safe transition to appropriate next level of care at discharge, Engage patient in therapeutic group addressing interpersonal concerns.  Short Term Goals: Engage patient in aftercare planning with referrals and resources, Increase social support, Increase ability to appropriately verbalize feelings, Increase emotional regulation, Facilitate acceptance of mental health diagnosis and concerns, and Increase skills for wellness and recovery  Therapeutic Interventions: Assess for all discharge needs, 1 to 1 time with Social worker, Explore available resources and support systems, Assess for adequacy in community support network, Educate family and significant other(s) on suicide prevention, Complete Psychosocial Assessment, Interpersonal group therapy.  Evaluation of Outcomes: Adequate for Discharge   Progress in Treatment: Attending groups: No. Participating in groups: No. Taking medication as prescribed: No. Toleration medication: No. Family/Significant other contact made: No, will contact:  once permission is given. Patient understands diagnosis: Yes. Discussing patient identified problems/goals with staff: Yes. Medical problems stabilized or resolved: Yes. Denies suicidal/homicidal ideation: Yes. Issues/concerns per patient self-inventory: No. Other: none  New problem(s) identified: No, Describe:  none  New Short Term/Long Term Goal(s): detox, medication management for mood stabilization; elimination of SI thoughts; development of comprehensive mental wellness/sobriety plan.   Patient Goals:  "try to get better and get the medication I need"  Discharge Plan or Barriers: CSW will assist patient in developing appropriate discharge plans.    Reason for Continuation of Hospitalization:  Aggression Anxiety Depression Medication stabilization Withdrawal symptoms  Estimated Length of Stay: 1-7 days  Recreational Therapy: Patient Stressors: N/A Patient Goal: Patient will engage in groups without prompting or encouragement from LRT x3 group sessions within 5 recreation therapy group sessions.  Attendees: Patient: Jessica [name]  03/08/2021 9:18 AM  Physician: Dr. Neale Burly, MD  03/08/2021 9:18 AM  Nursing: Cecille Amsterdam, RN  03/08/2021 9:18 AM  RN Care Manager: 03/08/2021 9:18 AM  Social Worker: Penni Homans, MSW, LCSW  03/08/2021 9:18 AM  Recreational Therapist: Garret Reddish, Drue Flirt, LRT  03/08/2021 9:18 AM  Other: Gwenevere Ghazi, MSW, [place], Bridget Hartshorn  03/08/2021 9:18 AM  Other:  03/08/2021 9:18 AM  Other: 03/08/2021 9:18 AM    Scribe for Treatment Team: Harden Mo, LCSW 03/08/2021 9:18 AM

## 2021-03-08 NOTE — [facility] Counselor (Signed)
Adult Comprehensive Assessment  Patient ID: Jessica [name], female   DOB: 11/24/1990, 30 y.o.   MRN: [id number]  Information Source: Information source: Patient  Current Stressors:  Patient states their primary concerns and needs for treatment are:: "Just feel down, depressed, cant eat or sleep" Patient states their goals for this hospitilization and ongoing recovery are:: "get put on the right medication and bbe a better person, do what im suppose to and act right" Educational / Learning stressors: none reported Employment / Job issues: none reported Family Relationships: none reported Surveyor, quantity / Lack of resources (include bankruptcy): none reported Housing / Lack of housing: "living with mother in Social worker and trying to find housing." Physical health (include injuries & life threatening diseases): none reported Social relationships: none reported Substance abuse: none reported Bereavement / Loss: none reported  Living/Environment/Situation:  Living Arrangements: Other relatives Living conditions (as described by patient or guardian): "good, im living with my mother-in-law" patient shared that she feels comfortable, safe, and tha the home is in proper working order. Who else lives in the home?: Mother-in-law, Spouse,  and two children (ages 3 &7) How long has patient lived in current situation?: 2 years What is atmosphere in current home: Supportive, Comfortable  Family History:  Marital status: Married Number of Years Married: 2 (Children are in custody of spouse/father.) What types of issues is patient dealing with in the relationship?: none reported Additional relationship information: Patient reports that her spouse provides sole income and is not sufficient. Are you sexually active?: No What is your sexual orientation?: Heterosexual Has your sexual activity been affected by drugs, alcohol, medication, or emotional stress?: n/a Does patient have children?: Yes How many children?:  2 How is patient's relationship with their children?: The children are getting on her nerves  Childhood History:  By whom was/is the patient raised?: Grandparents Additional childhood history information: Patient was raised by grandmother. Patient's mother died of overdoes when she was 66 y/o. Description of patient's relationship with caregiver when they were a child: "good" Patient's description of current relationship with people who raised him/her: "still good" How were you disciplined when you got in trouble as a child/adolescent?: not assessed. Does patient have siblings?: Yes Number of Siblings:  ("I have a lot, but i do not know how many") Description of patient's current relationship with siblings: "pretty decent" Did patient suffer any verbal/emotional/physical/sexual abuse as a child?: No Did patient suffer from severe childhood neglect?: No Has patient ever been sexually abused/assaulted/raped as an adolescent or adult?: No Was the patient ever a victim of a crime or a disaster?: No Witnessed domestic violence?: No Has patient been affected by domestic violence as an adult?: No  Education:  Highest grade of school patient has completed: 11th grade Currently a student?: No Learning disability?: Yes What learning problems does patient have?: ADHD  Employment/Work Situation:   Employment Situation: Unemployed Patient's Job has Been Impacted by Current Illness: Yes What is the Longest Time Patient has Held a Job?: 4-5 months Where was the Patient Employed at that Time?: [employer] Has Patient ever Been in the [employer]?: No  Financial Resources:   Surveyor, quantity resources: Sales executive, Income from spouse, Medicaid Does patient have a Lawyer or guardian?: No  Alcohol/Substance Abuse:   What has been your use of drugs/alcohol within the last 12 months?: [place], last use two weeks ago; Patient reports use of THC pens 3% THC and cigarettes (2 packs  daily) If attempted suicide, did drugs/alcohol play  a role in this?: No Alcohol/Substance Abuse Treatment Hx: Denies past history Has alcohol/substance abuse ever caused legal problems?: No  Social Support System:   Patient's Community Support System: Good Describe Community Support System: Patient reports mother-in-law, kids, and spouse as supportive of mental health Type of faith/religion: none How does patient's faith help to cope with current illness?: n/a  Leisure/Recreation:   Do You Have Hobbies?: Yes Leisure and Hobbies: Architect, Therapist, music, Armed forces logistics/support/administrative officer:   What is the patient's perception of their strengths?: Dedicated Patient states they can use these personal strengths during their treatment to contribute to their recovery: Patient agreed strengths could be utilized in treatment without elaborating how. Patient states these barriers may affect/interfere with their treatment: none foreseen at this time. Patient states these barriers may affect their return to the community: none foreseen at this time. Other important information patient would like considered in planning for their treatment: Patient reports that she feels emotionally labile, crying at bed time without knowing why.  Discharge Plan:   Currently receiving community mental health services: No Patient states concerns and preferences for aftercare planning are: Patient interested in recieving counseling related to death of her mother by overdose roughly 7 years ago. Patient states they will know when they are safe and ready for discharge when: patient unsure. Does patient have access to transportation?: Yes Does patient have financial barriers related to discharge medications?: No (Medicaid) Will patient be returning to same living situation after discharge?: Yes  Summary/Recommendations:   Summary and Recommendations (to be completed by the evaluator): 30 y/o female admitted due to feelings of depression  and emotional lability. Patient reports that she has not been able to eat or sleep. Patient reports symptoms have been present for the "past couple of weeks." Patient reports that she tried to self-taper from [employer] 6 months ago from unknown medications. Patient reports wanting counseling to process death of mother when she was 10 y/o from an overdose; patient was reaised by grandmother. During assessment patient appeared to have diffuculty concentrating on conversation, writer had to repeat questions on multiple occasions. Patient orriented x4, memory appears intact, affect is slighty irritable, mood congruent. Theraputic recomendations include crisis stabilization, medication management, encouraged group therapy, individualized case managemnet, and holistic wellness plan.  Corky Crafts. 03/08/2021

## 2021-03-08 NOTE — H&P (Signed)
Psychiatric Admission Assessment Adult  Patient Identification: Jessica [name] MRN:  [id number] Date of Evaluation:  03/08/2021 Chief Complaint:  Severe recurrent major depression without psychotic features (HCC) [F33.2] Principal Diagnosis: Severe recurrent major depression without psychotic features (HCC) Diagnosis:  Principal Problem:   Severe recurrent major depression without psychotic features (HCC) Active Problems:   Amphetamine abuse (HCC)   Benzodiazepine abuse (HCC)   Cannabis abuse   Self-inflicted laceration of left wrist (HCC)  CC "I need to get better and on my medications."  History of Present Illness: 30 year old female presenting with multiple symptoms of depression, anxiety, and with self-inflicted wounds.   No acute events overnight, medication noncompliant, attending to ADLs.  Patient seen during treatment team and again one-on-one at bedside.  She is extremely irritable and belligerent with myself and her staff.  She states that she just needs to restart her medications to help her with anxiety and ADHD.  She reports poor sleep, poor energy, poor appetite, inability to concentrate, racing thoughts, anxiety, and panic attacks.  When informed we would not give her any controlled substances such as Vyvanse, Valium, Ativan, or Xanax requested she initially becomes irate and demands discharge.  She is overheard having an argument with someone she lives with ultimately resulted in her cursing at someone and slamming the phone.  She subsequently asked to stay so that she could get on medicines to assist with her symptoms.  Once again we informed her we would not start controlled medications but we could assist with her ADHD and depression in other ways.  Discussed multiple medications including SSRIs, Wellbutrin, Strattera, Remeron, and BuSpar.  Patient ultimately agrees to starting Strattera for ADHD and Remeron for mood, insomnia, and poor appetite.  We will also provide Vistaril as  needed for anxiety.  As needed's are also in place for acute agitation.  Associated Signs/Symptoms: Depression Symptoms:  depressed mood, anhedonia, insomnia, fatigue, feelings of worthlessness/guilt, difficulty concentrating, hopelessness, recurrent thoughts of death, anxiety, panic attacks, loss of energy/fatigue, disturbed sleep, weight loss, decreased appetite, Duration of Depression Symptoms: Greater than two weeks  (Hypo) Manic Symptoms:  Irritable Mood, Anxiety Symptoms:  Excessive Worry, Panic Symptoms, Psychotic Symptoms:   Denies PTSD Symptoms: Negative Total Time spent with patient: 1 hour  Past Psychiatric History: Previous hospitalization at age 24 for history of cutting.  She denies current outpatient therapy or psychiatrist.  She reports that she has been on Vyvanse and Xanax.  Per controlled substance database she has never prescribed Vyvanse.  Controlled substance database shows a very concerning trail of doctor shopping, small prescriptions for narcotics and benzodiazepines from a multitude of different providers in different facilities.  Is the patient at risk to self? Yes.    Has the patient been a risk to self in the past 6 months? No.  Has the patient been a risk to self within the distant past? Yes.    Is the patient a risk to others? No.  Has the patient been a risk to others in the past 6 months? No.  Has the patient been a risk to others within the distant past? No.   Prior Inpatient Therapy:   Prior Outpatient Therapy:    Alcohol Screening: 1. How often do you have a drink containing alcohol?: Never 2. How many drinks containing alcohol do you have on a typical day when you are drinking?: 1 or 2 3. How often do you have six or more drinks on one occasion?: Never AUDIT-C Score: 0 4.  How often during the last year have you found that you were not able to stop drinking once you had started?: Never 5. How often during the last year have you failed to  do what was normally expected from you because of drinking?: Never 6. How often during the last year have you needed a first drink in the morning to get yourself going after a heavy drinking session?: Never 7. How often during the last year have you had a feeling of guilt of remorse after drinking?: Never 8. How often during the last year have you been unable to remember what happened the night before because you had been drinking?: Never 9. Have you or someone else been injured as a result of your drinking?: No 10. Has a relative or friend or a doctor or another health worker been concerned about your drinking or suggested you cut down?: No Alcohol Use Disorder Identification Test Final Score (AUDIT): 0 Substance Abuse History in the last 12 months:  Yes.   Consequences of Substance Abuse: Worsening mental health Previous Psychotropic Medications: Yes  Psychological Evaluations: No  Past Medical History:  Past Medical History:  Diagnosis Date   Abnormal Pap smear    ADHD (attention deficit hyperactivity disorder)    Anxiety    Chronic back pain    Depression    Migraine headache    UTI (lower urinary tract infection) 01/12/2013   Vaginal Pap smear, abnormal     Past Surgical History:  Procedure Laterality Date   CESAREAN SECTION N/A 09/08/2013   Procedure: CESAREAN SECTION;  Surgeon: Tereso Newcomer, MD;  Location: [facility];  Service: Obstetrics;  Laterality: N/A;   CESAREAN SECTION N/A 02/10/2018   Procedure: REPEAT CESAREAN SECTION;  Surgeon: Federico Flake, MD;  Location: [facility];  Service: Obstetrics;  Laterality: N/A;   TOOTH EXTRACTION     TUBAL LIGATION Bilateral 04/08/2018   Procedure: LAPAROSCOPIC BILATERAL TUBAL STERILIZATION WITH FALLOPE RING APPLICATION;  Surgeon: Tilda Burrow, MD;  Location: [facility];  Service: Gynecology;  Laterality: Bilateral;   Family History:  Family History  Problem Relation Age of Onset   Drug abuse Mother    Family  Psychiatric  History: Denies Tobacco Screening:   Social History:  Social History   Substance and Sexual Activity  Alcohol Use Yes     Social History   Substance and Sexual Activity  Drug Use No    Additional Social History:                           Allergies:   Allergies  Allergen Reactions   Atarax [Hydroxyzine] Other (See Comments)    "makes me crazy"   Trazodone And Nefazodone Other (See Comments)    Has weird dreams   Lab Results:  Results for orders placed or performed during the hospital encounter of 03/07/21 (from the past 48 hour(s))  Comprehensive metabolic panel     Status: Abnormal   Collection Time: 03/07/21  2:50 PM  Result Value Ref Range   Sodium 139 135 - 145 mmol/L   Potassium 3.5 3.5 - 5.1 mmol/L   Chloride 107 98 - 111 mmol/L   CO2 21 (L) 22 - 32 mmol/L   Glucose, Bld 96 70 - 99 mg/dL    Comment: Glucose reference range applies only to samples taken after fasting for at least 8 hours.   BUN 13 6 - 20 mg/dL   Creatinine, Ser 6.62  0.44 - 1.00 mg/dL   Calcium 9.7 8.9 - 84.610.3 mg/dL   Total Protein 8.0 6.5 - 8.1 g/dL   Albumin 4.8 3.5 - 5.0 g/dL   AST 18 15 - 41 U/L   ALT 15 0 - 44 U/L   Alkaline Phosphatase 45 38 - 126 U/L   Total Bilirubin 1.3 (H) 0.3 - 1.2 mg/dL   GFR, Estimated >96>60 >29>60 mL/min    Comment: (NOTE) Calculated using the CKD-EPI Creatinine Equation (2021)    Anion gap 11 5 - 15    Comment: Performed at [facility], [address] [place], [place] [address]  Ethanol     Status: None   Collection Time: 03/07/21  2:50 PM  Result Value Ref Range   Alcohol, Ethyl (B) <10 <10 mg/dL    Comment: (NOTE) Lowest detectable limit for serum alcohol is 10 mg/dL.  For medical purposes only. Performed at [facility], [address] [place], [place] [address]   Salicylate level     Status: Abnormal   Collection Time: 03/07/21  2:50 PM  Result Value Ref Range   Salicylate Lvl <7.0 (L) 7.0 - 30.0 mg/dL     Comment: Performed at [facility], [address] [place], [place] [address]  Acetaminophen level     Status: Abnormal   Collection Time: 03/07/21  2:50 PM  Result Value Ref Range   Acetaminophen (Tylenol), Serum <10 (L) 10 - 30 ug/mL    Comment: (NOTE) Therapeutic concentrations vary significantly. A range of 10-30 ug/mL  may be an effective concentration for many patients. However, some  are best treated at concentrations outside of this range. Acetaminophen concentrations >150 ug/mL at 4 hours after ingestion  and >50 ug/mL at 12 hours after ingestion are often associated with  toxic reactions.  Performed at [facility], [address] [place], [place] [address]   cbc     Status: None   Collection Time: 03/07/21  2:50 PM  Result Value Ref Range   WBC 6.8 4.0 - 10.5 K/uL   RBC 4.97 3.87 - 5.11 MIL/uL   Hemoglobin 14.8 12.0 - 15.0 g/dL   HCT 40.342.5 47.436.0 - 25.946.0 %   MCV 85.5 80.0 - 100.0 fL   MCH 29.8 26.0 - 34.0 pg   MCHC 34.8 30.0 - 36.0 g/dL   RDW 56.312.5 87.511.5 - 64.315.5 %   Platelets 260 150 - 400 K/uL   nRBC 0.0 0.0 - 0.2 %    Comment: Performed at [facility], [address] [place], [place] [address]  Urine Drug Screen, Qualitative     Status: Abnormal   Collection Time: 03/07/21  2:50 PM  Result Value Ref Range   Tricyclic, Ur Screen NONE DETECTED NONE DETECTED   Amphetamines, Ur Screen POSITIVE (A) NONE DETECTED   MDMA (Ecstasy)Ur Screen NONE DETECTED NONE DETECTED   Cocaine Metabolite,Ur Joice NONE DETECTED NONE DETECTED   Opiate, Ur Screen NONE DETECTED NONE DETECTED   Phencyclidine (PCP) Ur S NONE DETECTED NONE DETECTED   Cannabinoid 50 Ng, Ur Kendall POSITIVE (A) NONE DETECTED   Barbiturates, Ur Screen NONE DETECTED NONE DETECTED   Benzodiazepine, Ur Scrn POSITIVE (A) NONE DETECTED   Methadone Scn, Ur NONE DETECTED NONE DETECTED    Comment: (NOTE) Tricyclics + metabolites, urine    Cutoff 1000 ng/mL Amphetamines + metabolites, urine  Cutoff  1000 ng/mL MDMA (Ecstasy), urine              Cutoff  500 ng/mL Cocaine Metabolite, urine          Cutoff 300 ng/mL Opiate + metabolites, urine        Cutoff 300 ng/mL Phencyclidine (PCP), urine         Cutoff 25 ng/mL Cannabinoid, urine                 Cutoff 50 ng/mL Barbiturates + metabolites, urine  Cutoff 200 ng/mL Benzodiazepine, urine              Cutoff 200 ng/mL Methadone, urine                   Cutoff 300 ng/mL  The urine drug screen provides only a preliminary, unconfirmed analytical test result and should not be used for non-medical purposes. Clinical consideration and professional judgment should be applied to any positive drug screen result due to possible interfering substances. A more specific alternate chemical method must be used in order to obtain a confirmed analytical result. Gas chromatography / mass spectrometry (GC/MS) is the preferred confirm atory method. Performed at [facility], [address] [place], [place] [address]   POC urine preg, ED     Status: None   Collection Time: 03/07/21  2:59 PM  Result Value Ref Range   Preg Test, Ur NEGATIVE NEGATIVE    Comment:        THE SENSITIVITY OF THIS METHODOLOGY IS >24 mIU/mL   Resp Panel by RT-PCR (Flu A&B, Covid) Nasopharyngeal Swab     Status: None   Collection Time: 03/07/21  4:37 PM   Specimen: Nasopharyngeal Swab; Nasopharyngeal(NP) swabs in vial transport medium  Result Value Ref Range   SARS Coronavirus 2 by RT PCR NEGATIVE NEGATIVE    Comment: (NOTE) SARS-CoV-2 target nucleic acids are NOT DETECTED.  The SARS-CoV-2 RNA is generally detectable in upper respiratory specimens during the acute phase of infection. The lowest concentration of SARS-CoV-2 viral copies this assay can detect is 138 copies/mL. A negative result does not preclude SARS-Cov-2 infection and should not be used as the sole basis for treatment or other patient management decisions. A negative result may occur with   improper specimen collection/handling, submission of specimen other than nasopharyngeal swab, presence of viral mutation(s) within the areas targeted by this assay, and inadequate number of viral copies(<138 copies/mL). A negative result must be combined with clinical observations, patient history, and epidemiological information. The expected result is Negative.  Fact Sheet for Patients:  BloggerCourse.com  Fact Sheet for Healthcare Providers:  SeriousBroker.it  This test is no t yet approved or cleared by the [place] FDA and  has been authorized for detection and/or diagnosis of SARS-CoV-2 by FDA under an Emergency Use Authorization (EUA). This EUA will remain  in effect (meaning this test can be used) for the duration of the COVID-19 declaration under Section 564(b)(1) of the Act, 21 U.S.C.section 360bbb-3(b)(1), unless the authorization is terminated  or revoked sooner.       Influenza A by PCR NEGATIVE NEGATIVE   Influenza B by PCR NEGATIVE NEGATIVE    Comment: (NOTE) The Xpert Xpress SARS-CoV-2/FLU/RSV plus assay is intended as an aid in the diagnosis of influenza from Nasopharyngeal swab specimens and should not be used as a sole basis for treatment. Nasal washings and aspirates are unacceptable for Xpert Xpress SARS-CoV-2/FLU/RSV testing.  Fact Sheet for Patients: BloggerCourse.com  Fact Sheet for Healthcare Providers: SeriousBroker.it  This test is not yet approved or cleared by the [place]  FDA and has been authorized for detection and/or diagnosis of SARS-CoV-2 by FDA under an Emergency Use Authorization (EUA). This EUA will remain in effect (meaning this test can be used) for the duration of the COVID-19 declaration under Section 564(b)(1) of the Act, 21 U.S.C. section 360bbb-3(b)(1), unless the authorization is terminated or revoked.  Performed at  [facility], [address], [place], [place] [address]     Blood Alcohol level:  Lab Results  Component Value Date   [facility] <10 03/07/2021    Metabolic Disorder Labs:  No results found for: HGBA1C, MPG No results found for: PROLACTIN No results found for: CHOL, TRIG, HDL, CHOLHDL, VLDL, LDLCALC  Current Medications: Current Facility-Administered Medications  Medication Dose Route Frequency Provider Last Rate Last Admin   acetaminophen (TYLENOL) tablet 650 mg  650 mg Oral Q6H PRN Clapacs, Jackquline Denmark, MD       alum & mag hydroxide-simeth (MAALOX/MYLANTA) 200-200-20 MG/5ML suspension 30 mL  30 mL Oral Q4H PRN Clapacs, Jackquline Denmark, MD       atomoxetine (STRATTERA) capsule 40 mg  40 mg Oral Daily Jesse Sans, MD       hydrOXYzine (ATARAX/VISTARIL) tablet 50 mg  50 mg Oral TID PRN Jesse Sans, MD       magnesium hydroxide (MILK OF MAGNESIA) suspension 30 mL  30 mL Oral Daily PRN Clapacs, Jackquline Denmark, MD       mirtazapine (REMERON) tablet 15 mg  15 mg Oral QHS Jesse Sans, MD       OLANZapine [facility]) tablet 10 mg  10 mg Oral Q6H PRN Jesse Sans, MD       prazosin (MINIPRESS) capsule 2 mg  2 mg Oral QHS Gillermo Murdoch, NP       ziprasidone (GEODON) injection 20 mg  20 mg Intramuscular Q6H PRN Jesse Sans, MD       PTA Medications: Medications Prior to Admission  Medication Sig Dispense Refill Last Dose   albuterol (VENTOLIN HFA) 108 (90 Base) MCG/ACT inhaler Inhale 1-2 puffs into the lungs every 6 (six) hours as needed for wheezing or shortness of breath. 18 g 0    fluticasone (FLONASE) 50 MCG/ACT nasal spray Place 2 sprays into both nostrils daily. 16 g 0    guaiFENesin-codeine 100-10 MG/5ML syrup Take 5 mLs by mouth 3 (three) times daily as needed for cough. 90 mL 0    ibuprofen (ADVIL) 800 MG tablet Take 1 tablet (800 mg total) by mouth every 8 (eight) hours as needed for moderate pain. 21 tablet 0    LORazepam (ATIVAN) 0.5 MG tablet Take 1 tablet (0.5 mg  total) by mouth every 8 (eight) hours as needed for anxiety. (Patient not taking: No sig reported) 30 tablet 0    oxyCODONE-acetaminophen (PERCOCET) 5-325 MG tablet Take 1 tablet by mouth every 6 (six) hours as needed. 20 tablet 0     Musculoskeletal: Strength & Muscle Tone: within normal limits Gait & Station: normal Patient leans: N/A            Psychiatric Specialty Exam:  Presentation  General Appearance: Appropriate for Environment; Casual  Eye Contact:Good  Speech:Normal Rate  Speech Volume:Normal  Handedness:Right   Mood and Affect  Mood:Irritable  Affect:Congruent   Thought Process  Thought Processes:Goal Directed  Duration of Psychotic Symptoms: No data recorded Past Diagnosis of Schizophrenia or Psychoactive disorder: No  Descriptions of Associations:Intact  Orientation:Full (Time, Place and Person)  Thought Content:Logical  Hallucinations:Hallucinations: None  Ideas of Reference:None  Suicidal Thoughts:Suicidal Thoughts: Yes, Passive SI Passive Intent and/or Plan: Without Plan  Homicidal Thoughts:Homicidal Thoughts: No   Sensorium  Memory:Immediate Fair; Recent Fair; Remote Fair  Judgment:Intact  Insight:Present   Executive Functions  Concentration:Fair  Attention Span:Fair  Recall:Fair  Fund of Knowledge:Fair  Language:Fair   Psychomotor Activity  Psychomotor Activity:Psychomotor Activity: Normal   Assets  Assets:Desire for Improvement; Financial Resources/Insurance; Housing; Physical Health; Social Support; Talents/Skills; Vocational/Educational   Sleep  Sleep:Sleep: Fair Number of Hours of Sleep: 5.75    Physical Exam: Physical Exam Vitals and nursing note reviewed.  Constitutional:      Appearance: Normal appearance.  HENT:     Head: Normocephalic and atraumatic.     Right Ear: External ear normal.     Left Ear: External ear normal.     Nose: Nose normal.     Mouth/Throat:     Mouth: Mucous  membranes are moist.     Pharynx: Oropharynx is clear.  Eyes:     Extraocular Movements: Extraocular movements intact.     Conjunctiva/sclera: Conjunctivae normal.     Pupils: Pupils are equal, round, and reactive to light.  Cardiovascular:     Rate and Rhythm: Normal rate.     Pulses: Normal pulses.  Pulmonary:     Effort: Pulmonary effort is normal.     Breath sounds: Normal breath sounds.  Abdominal:     General: Abdomen is flat.     Palpations: Abdomen is soft.  Musculoskeletal:        General: No swelling. Normal range of motion.     Cervical back: Normal range of motion. No rigidity.  Skin:    General: Skin is warm and dry.  Neurological:     General: No focal deficit present.     Mental Status: She is alert and oriented to person, place, and time.  Psychiatric:        Attention and Perception: Attention and perception normal.        Mood and Affect: Mood is anxious. Affect is angry.        Speech: Speech normal.        Behavior: Behavior is agitated.        Thought Content: Thought content includes suicidal ideation. Thought content does not include suicidal plan.        Cognition and Memory: Cognition and memory normal.        Judgment: Judgment is inappropriate.   Review of Systems  Constitutional:  Positive for malaise/fatigue. Negative for chills.  HENT: Negative.    Eyes: Negative.   Respiratory: Negative.    Cardiovascular: Negative.   Gastrointestinal: Negative.   Genitourinary: Negative.   Musculoskeletal:  Positive for back pain and myalgias.  Skin: Negative.   Neurological: Negative.   Endo/Heme/Allergies:  Positive for environmental allergies. Does not bruise/bleed easily.  Psychiatric/Behavioral:  Positive for depression, substance abuse and suicidal ideas. Negative for hallucinations and memory loss. The patient is nervous/anxious and has insomnia.   Blood pressure (!) 117/95, pulse 84, temperature 98.2 F (36.8 C), temperature source Oral, resp. rate  18, height  (1.575 m), weight 59 kg, last menstrual period 02/08/2021, SpO2 99 %. Body mass index is 23.79 kg/m.  Treatment Plan Summary: Daily contact with patient to assess and evaluate symptoms and progress in treatment and Medication management 29 year old female presenting for worsening mood and self-inflicted injury.  Diagnosis consistent with MDD, recurrent, severe without psychotic features and generalized anxiety disorder.  Patient also  reports a history of ADHD.  Review of controlled substance database in chart shows a concerning history of doctor shopping for controlled substances.  She has a history of amphetamine abuse, benzodiazepine abuse, and narcotic abuse.  Patient will not be given any controlled substances while in the hospital, and has been informed of such.  We will start Strattera 40 mg daily for ADHD and mood.  Start Remeron 15 mg nightly for mood, insomnia, and poor appetite.  We will titrate to effect.  Vistaril as needed in place for anxiety, as needed was also in place for acute agitation.  Observation Level/Precautions:  15 minute checks  Laboratory:   Completed in ED  Psychotherapy:    Medications:    Consultations:    Discharge Concerns:    Estimated LOS:  Other:     Physician Treatment Plan for Primary Diagnosis: Severe recurrent major depression without psychotic features (HCC) Long Term Goal(s): Improvement in symptoms so as ready for discharge  Short Term Goals: Ability to identify changes in lifestyle to reduce recurrence of condition will improve, Ability to verbalize feelings will improve, Ability to disclose and discuss suicidal ideas, Ability to demonstrate self-control will improve, Ability to identify and develop effective coping behaviors will improve, Ability to maintain clinical measurements within normal limits will improve, Compliance with prescribed medications will improve, and Ability to identify triggers associated with substance abuse/mental  health issues will improve  Physician Treatment Plan for Secondary Diagnosis: Principal Problem:   Severe recurrent major depression without psychotic features (HCC) Active Problems:   Amphetamine abuse (HCC)   Benzodiazepine abuse (HCC)   Cannabis abuse   Self-inflicted laceration of left wrist (HCC)  Long Term Goal(s): Improvement in symptoms so as ready for discharge  Short Term Goals: Ability to identify changes in lifestyle to reduce recurrence of condition will improve, Ability to verbalize feelings will improve, Ability to disclose and discuss suicidal ideas, Ability to demonstrate self-control will improve, Ability to identify and develop effective coping behaviors will improve, Ability to maintain clinical measurements within normal limits will improve, Compliance with prescribed medications will improve, and Ability to identify triggers associated with substance abuse/mental health issues will improve  I certify that inpatient services furnished can reasonably be expected to improve the patient's condition.    Jesse Sans, MD 7/29/202210:18 AM

## 2021-03-08 NOTE — BHH Suicide Risk Assessment (Signed)
[facility] Admission Suicide Risk Assessment   Nursing information obtained from:  Patient Demographic factors:  Caucasian, Low socioeconomic status Current Mental Status:  Self-harm thoughts, Self-harm behaviors Loss Factors:  NA (Patient unsure why she is feeling this way) Historical Factors:  Impulsivity Risk Reduction Factors:  Positive social support  Total Time spent with patient: 1 hour Principal Problem: Severe recurrent major depression without psychotic features (HCC) Diagnosis:  Principal Problem:   Severe recurrent major depression without psychotic features (HCC) Active Problems:   Amphetamine abuse (HCC)   Benzodiazepine abuse (HCC)   Cannabis abuse   Self-inflicted laceration of left wrist (HCC)  Subjective Data: 30 year old female presenting with multiple symptoms of depression, anxiety, and with self-inflicted wounds.   No acute events overnight, medication noncompliant, attending to ADLs.  Patient seen during treatment team and again one-on-one at bedside.  She is extremely irritable and belligerent with myself and her staff.  She states that she just needs to restart her medications to help her with anxiety and ADHD.  She reports poor sleep, poor energy, poor appetite, inability to concentrate, racing thoughts, anxiety, and panic attacks.  When informed we would not give her any controlled substances such as Vyvanse, Valium, Ativan, or Xanax requested she initially becomes irate and demands discharge.  She is overheard having an argument with someone she lives with ultimately resulted in her cursing at someone and slamming the phone.  She subsequently asked to stay so that she could get on medicines to assist with her symptoms.  Once again we informed her we would not start controlled medications but we could assist with her ADHD and depression in other ways.  Discussed multiple medications including SSRIs, Wellbutrin, Strattera, Remeron, and BuSpar.  Patient ultimately agrees to  starting Strattera for ADHD and Remeron for mood, insomnia, and poor appetite.  We will also provide Vistaril as needed for anxiety.  As needed's are also in place for acute agitation.  Continued Clinical Symptoms:  Alcohol Use Disorder Identification Test Final Score (AUDIT): 0 The "Alcohol Use Disorders Identification Test", Guidelines for Use in Primary Care, Second Edition.  World Science writer [facility]). Score between 0-7:  no or low risk or alcohol related problems. Score between 8-15:  moderate risk of alcohol related problems. Score between 16-19:  high risk of alcohol related problems. Score 20 or above:  warrants further diagnostic evaluation for alcohol dependence and treatment.   CLINICAL FACTORS:   Severe Anxiety and/or Agitation Depression:   Aggression Anhedonia Comorbid alcohol abuse/dependence Impulsivity Insomnia Severe Alcohol/Substance Abuse/Dependencies Chronic Pain More than one psychiatric diagnosis Unstable or Poor Therapeutic Relationship Previous Psychiatric Diagnoses and Treatments   Musculoskeletal: Strength & Muscle Tone: within normal limits Gait & Station: normal Patient leans: N/A  Psychiatric Specialty Exam:  Presentation  General Appearance: Appropriate for Environment; Casual  Eye Contact:Good  Speech:Normal Rate  Speech Volume:Normal  Handedness:Right   Mood and Affect  Mood:Irritable  Affect:Congruent   Thought Process  Thought Processes:Goal Directed  Descriptions of Associations:Intact  Orientation:Full (Time, Place and Person)  Thought Content:Logical  History of Schizophrenia/Schizoaffective disorder:No  Duration of Psychotic Symptoms:No data recorded Hallucinations:Hallucinations: None  Ideas of Reference:None  Suicidal Thoughts:Suicidal Thoughts: Yes, Passive SI Passive Intent and/or Plan: Without Plan  Homicidal Thoughts:Homicidal Thoughts: No   Sensorium  Memory:Immediate Fair; Recent Fair; Remote  Fair  Judgment:Intact  Insight:Present   Executive Functions  Concentration:Fair  Attention Span:Fair  Recall:Fair  Fund of Knowledge:Fair  Language:Fair   Psychomotor Activity  Psychomotor Activity:Psychomotor Activity: Normal  Assets  Assets:Desire for Improvement; Financial Resources/Insurance; Housing; Physical Health; Social Support; Talents/Skills; Vocational/Educational   Sleep  Sleep:Sleep: Fair Number of Hours of Sleep: 5.75    Physical Exam: Physical Exam ROS Blood pressure (!) 117/95, pulse 84, temperature 98.2 F (36.8 C), temperature source Oral, resp. rate 18, height 5\' 2"  (1.575 m), weight 59 kg, last menstrual period 02/08/2021, SpO2 99 %. Body mass index is 23.79 kg/m.   COGNITIVE FEATURES THAT CONTRIBUTE TO RISK:  Closed-mindedness and Loss of executive function    SUICIDE RISK:   Moderate:  Frequent suicidal ideation with limited intensity, and duration, some specificity in terms of plans, no associated intent, good self-control, limited dysphoria/symptomatology, some risk factors present, and identifiable protective factors, including available and accessible social support.  PLAN OF CARE: Continue inpatient stay, see H&P for details.   I certify that inpatient services furnished can reasonably be expected to improve the patient's condition.   04/11/2021, MD 03/08/2021, 10:34 AM

## 2021-03-09 DIAGNOSIS — F332 Major depressive disorder, recurrent severe without psychotic features: Secondary | ICD-10-CM | POA: Diagnosis not present

## 2021-03-09 MED ORDER — PRAZOSIN HCL 2 MG PO CAPS: 2.0000 mg | ORAL_CAPSULE | Freq: Every day | ORAL | 1 refills | Status: DC

## 2021-03-09 MED ORDER — MIRTAZAPINE 15 MG PO TABS: 15.0000 mg | ORAL_TABLET | Freq: Every day | ORAL | 1 refills | Status: DC

## 2021-03-09 MED ORDER — HYDROXYZINE HCL 50 MG PO TABS: 50.0000 mg | ORAL_TABLET | Freq: Three times a day (TID) | ORAL | 1 refills | Status: DC | PRN

## 2021-03-09 MED ORDER — ATOMOXETINE HCL 40 MG PO CAPS: 40.0000 mg | ORAL_CAPSULE | Freq: Every day | ORAL | 1 refills | Status: DC

## 2021-03-09 NOTE — Progress Notes (Signed)
  [facility] Adult Case Management Discharge Plan :  Will you be returning to the same living situation after discharge:  Yes,  Patient will be going home with her husband  At discharge, do you have transportation home?: Yes,  Patient stated her husband will be picking her up  Do you have the ability to pay for your medications: Yes,  Patient stated she had the ability to pay for her medications   Release of information consent forms completed and in the chart;  Patient's signature needed at discharge.  Patient to Follow up at:  Follow-up Information     [facility] Call in 2 day(s).   Contact information: [address] [place] [address] [phone]                 Next level of care provider has access to [facility] Link:no  Safety Planning and Suicide Prevention discussed: Yes,        Has patient been referred to the Quitline?: Patient refused referral  Patient has been referred for addiction treatment: N/A  Susa Simmonds, LCSWA 03/09/2021, 12:01 PM

## 2021-03-09 NOTE — BHH Suicide Risk Assessment (Signed)
[facility] Discharge Suicide Risk Assessment   Principal Problem: Severe recurrent major depression without psychotic features [facility]) Discharge Diagnoses: Principal Problem:   Severe recurrent major depression without psychotic features (HCC) Active Problems:   Amphetamine abuse (HCC)   Benzodiazepine abuse (HCC)   Cannabis abuse   Self-inflicted laceration of left wrist (HCC)   Total Time spent with patient: 45 minutes  Musculoskeletal: Strength & Muscle Tone: within normal limits Gait & Station: normal Patient leans: N/A  Psychiatric Specialty Exam  Presentation  General Appearance: Appropriate for Environment; Casual  Eye Contact:Good  Speech:Normal Rate  Speech Volume:Normal  Handedness:Right   Mood and Affect  Mood:Irritable  Duration of Depression Symptoms: Greater than two weeks  Affect:Congruent   Thought Process  Thought Processes:Goal Directed  Descriptions of Associations:Intact  Orientation:Full (Time, Place and Person)  Thought Content:Logical  History of Schizophrenia/Schizoaffective disorder:No  Duration of Psychotic Symptoms:No data recorded Hallucinations:Hallucinations: None  Ideas of Reference:None  Suicidal Thoughts:Suicidal Thoughts: Yes, Passive SI Passive Intent and/or Plan: Without Plan  Homicidal Thoughts:Homicidal Thoughts: No   Sensorium  Memory:Immediate Fair; Recent Fair; Remote Fair  Judgment:Intact  Insight:Present   Executive Functions  Concentration:Fair  Attention Span:Fair  Recall:Fair  Fund of Knowledge:Fair  Language:Fair   Psychomotor Activity  Psychomotor Activity:Psychomotor Activity: Normal   Assets  Assets:Desire for Improvement; Financial Resources/Insurance; Housing; Physical Health; Social Support; Talents/Skills; Vocational/Educational   Sleep  Sleep:Sleep: Fair Number of Hours of Sleep: 5.75   Physical Exam: Physical Exam Vitals and nursing note reviewed.  Constitutional:       Appearance: Normal appearance.  HENT:     Head: Normocephalic and atraumatic.     Mouth/Throat:     Pharynx: Oropharynx is clear.  Eyes:     Pupils: Pupils are equal, round, and reactive to light.  Cardiovascular:     Rate and Rhythm: Normal rate and regular rhythm.  Pulmonary:     Effort: Pulmonary effort is normal.     Breath sounds: Normal breath sounds.  Abdominal:     General: Abdomen is flat.     Palpations: Abdomen is soft.  Musculoskeletal:        General: Normal range of motion.  Skin:    General: Skin is warm and dry.  Neurological:     General: No focal deficit present.     Mental Status: She is alert. Mental status is at baseline.  Psychiatric:        Attention and Perception: Attention normal.        Mood and Affect: Mood normal.        Speech: Speech normal.        Behavior: Behavior normal.        Thought Content: Thought content normal.        Cognition and Memory: Cognition normal.        Judgment: Judgment is impulsive.   Review of Systems  Constitutional: Negative.   HENT: Negative.    Eyes: Negative.   Respiratory: Negative.    Cardiovascular: Negative.   Gastrointestinal: Negative.   Musculoskeletal: Negative.   Skin: Negative.   Neurological: Negative.   Psychiatric/Behavioral: Negative.    Blood pressure 111/75, pulse 85, temperature 98.3 F (36.8 C), temperature source Oral, resp. rate 18, height 5\' 2"  (1.575 m), weight 59 kg, last menstrual period 02/08/2021, SpO2 100 %. Body mass index is 23.79 kg/m.  Mental Status Per Nursing Assessment::   On Admission:  Self-harm thoughts, Self-harm behaviors  Demographic Factors:  Adolescent or young adult and  Caucasian  Loss Factors: NA  Historical Factors: Impulsivity  Risk Reduction Factors:   Sense of responsibility to family, Living with another person, especially a relative, and Positive social support  Continued Clinical Symptoms:  Depression:   Impulsivity  Cognitive Features That  Contribute To Risk:  None    Suicide Risk:  Minimal: No identifiable suicidal ideation.  Patients presenting with no risk factors but with morbid ruminations; may be classified as minimal risk based on the severity of the depressive symptoms   Follow-up Information     [facility] Call in 2 day(s).   Contact information: [address] [place] [address] [phone]                 Plan Of Care/Follow-up recommendations:  Activity:  activity as tolerated, regular diet. Please follow up with [facility] or other mental health treatment in home community  Mordecai Rasmussen, MD 03/09/2021, 12:19 PM

## 2021-03-09 NOTE — Progress Notes (Signed)
Patient alert and oriented x 3  affect is blunted, thoughts are organized and coherent, she appeared anxious  and restless stated she was having a panic attack and requested minipress, her vital was taken and she was then given the medication. Patient later was calm, and receptive to staff, she currently denies SI/HI/AVH. 15 minutes safety checks maintained will continue to monitor.

## 2021-03-09 NOTE — Discharge Summary (Signed)
Physician Discharge Summary Note  Patient:  Jessica [name] is an 30 y.o., female MRN:  [id number] DOB:  July 04, 1991 Patient phone:  [phone] (home)  Patient address:   [address] [place] [address],  Total Time spent with patient: 45 minutes  Date of Admission:  03/07/2021 Date of Discharge: 03/09/2021  Reason for Admission: Patient was admitted through the emergency room where she presented with complaints of depression and anxiety and some suicidal ideation.  Principal Problem: Severe recurrent major depression without psychotic features [facility]) Discharge Diagnoses: Principal Problem:   Severe recurrent major depression without psychotic features (HCC) Active Problems:   Amphetamine abuse (HCC)   Benzodiazepine abuse (HCC)   Cannabis abuse   Self-inflicted laceration of left wrist [facility])   Past Psychiatric History: Patient has a history of multiple medication usages.  Details unclear.  Reports having been diagnosed with ADHD in the past.  Past history of cutting.  Past Medical History:  Past Medical History:  Diagnosis Date   Abnormal Pap smear    ADHD (attention deficit hyperactivity disorder)    Anxiety    Chronic back pain    Depression    Migraine headache    UTI (lower urinary tract infection) 01/12/2013   Vaginal Pap smear, abnormal     Past Surgical History:  Procedure Laterality Date   CESAREAN SECTION N/A 09/08/2013   Procedure: CESAREAN SECTION;  Surgeon: Tereso Newcomer, MD;  Location: [facility]S;  Service: Obstetrics;  Laterality: N/A;   CESAREAN SECTION N/A 02/10/2018   Procedure: REPEAT CESAREAN SECTION;  Surgeon: Federico Flake, MD;  Location: [facility]IRTH[facility] SU[facility];  Service: Obstetrics;  Laterality: N/A;   TOOTH EXTRACTION     TUBAL LIGATION Bilateral 04/08/2018   Procedure: LAPAROSCOPIC BILATERAL TUBAL STERILIZATION WITH FALLOPE RING APPLICATION;  Surgeon: Tilda Burrow, MD;  Location: [facility] ORS;  Service: Gynecology;  Laterality: Bilateral;   Family  History:  Family History  Problem Relation Age of Onset   Drug abuse Mother    Family Psychiatric  History: Drug abuse in family Social History:  Social History   Substance and Sexual Activity  Alcohol Use Yes     Social History   Substance and Sexual Activity  Drug Use No    Social History   Socioeconomic History   Marital status: Married    Spouse name: Not on file   Number of children: 2   Years of education: Not on file   Highest education level: Not on file  Occupational History   Not on file  Tobacco Use   Smoking status: Some Days    Packs/day: 1.00    Types: Cigarettes   Smokeless tobacco: Never  Vaping Use   Vaping Use: Never used  Substance and Sexual Activity   Alcohol use: Yes   Drug use: No   Sexual activity: Not Currently    Birth control/protection: Surgical    Comment: tubal  Other Topics Concern   Not on file  Social History Narrative   Not on file   Social Determinants of Health   Financial Resource Strain: Not on file  Food Insecurity: Not on file  Transportation Needs: Not on file  Physical Activity: Not on file  Stress: Not on file  Social Connections: Not on file    Hospital Course: Admitted to psychiatric ward.  Patient has not displayed any self injuring violent or dangerous behavior.  She displayed a lot of interest in controlled substances as treatments for her conditions.  Primary  psychiatrist advised her that she did not recommend controlled substances but tried her on other medicines.  Patient initially was oppositional but then became compliant.  On evaluation today the patient states that her mood is greatly improved.  Denies any sense of depression.  Denies any suicidal or homicidal thought at all or psychosis.  Strongly requesting discharge.  Patient was counseled about the importance of taking treatment seriously and engaging with outpatient mental health providers in the community and will be referred to [facility].  At this point  however does not appear likely to benefit from further inpatient treatment.  Prescriptions provided for medications.  Patient currently appears lucid and rational and able to make reasonable decisions and not acutely meeting commitment criteria.  Discharge from hospital.  Physical Findings: AIMS:  , ,  ,  ,    CIWA:    COWS:     Musculoskeletal: Strength & Muscle Tone: within normal limits Gait & Station: normal Patient leans: N/A   Psychiatric Specialty Exam:  Presentation  General Appearance: Appropriate for Environment; Casual  Eye Contact:Good  Speech:Normal Rate  Speech Volume:Normal  Handedness:Right   Mood and Affect  Mood:Irritable  Affect:Congruent   Thought Process  Thought Processes:Goal Directed  Descriptions of Associations:Intact  Orientation:Full (Time, Place and Person)  Thought Content:Logical  History of Schizophrenia/Schizoaffective disorder:No  Duration of Psychotic Symptoms:No data recorded Hallucinations:Hallucinations: None  Ideas of Reference:None  Suicidal Thoughts:Suicidal Thoughts: Yes, Passive SI Passive Intent and/or Plan: Without Plan  Homicidal Thoughts:Homicidal Thoughts: No   Sensorium  Memory:Immediate Fair; Recent Fair; Remote Fair  Judgment:Intact  Insight:Present   Executive Functions  Concentration:Fair  Attention Span:Fair  Recall:Fair  Fund of Knowledge:Fair  Language:Fair   Psychomotor Activity  Psychomotor Activity:Psychomotor Activity: Normal   Assets  Assets:Desire for Improvement; Financial Resources/Insurance; Housing; Physical Health; Social Support; Talents/Skills; Vocational/Educational   Sleep  Sleep:Sleep: Fair Number of Hours of Sleep: 5.75    Physical Exam: Physical Exam Vitals and nursing note reviewed.  Constitutional:      Appearance: Normal appearance.  HENT:     Head: Normocephalic and atraumatic.     Mouth/Throat:     Pharynx: Oropharynx is clear.  Eyes:      Pupils: Pupils are equal, round, and reactive to light.  Cardiovascular:     Rate and Rhythm: Normal rate and regular rhythm.  Pulmonary:     Effort: Pulmonary effort is normal.     Breath sounds: Normal breath sounds.  Abdominal:     General: Abdomen is flat.     Palpations: Abdomen is soft.  Musculoskeletal:        General: Normal range of motion.  Skin:    General: Skin is warm and dry.  Neurological:     General: No focal deficit present.     Mental Status: She is alert. Mental status is at baseline.  Psychiatric:        Mood and Affect: Mood normal.        Thought Content: Thought content normal.   Review of Systems  Constitutional: Negative.   HENT: Negative.    Eyes: Negative.   Respiratory: Negative.    Cardiovascular: Negative.   Gastrointestinal: Negative.   Musculoskeletal: Negative.   Skin: Negative.   Neurological: Negative.   Psychiatric/Behavioral: Negative.    Blood pressure 111/75, pulse 85, temperature 98.3 F (36.8 C), temperature source Oral, resp. rate 18, height 5\' 2"  (1.575 m), weight 59 kg, last menstrual period 02/08/2021, SpO2 100 %. Body mass index is  23.79 kg/m.   Social History   Tobacco Use  Smoking Status Some Days   Packs/day: 1.00   Types: Cigarettes  Smokeless Tobacco Never   Tobacco Cessation:  N/A, patient does not currently use tobacco products   Blood Alcohol level:  Lab Results  Component Value Date   ETH <10 03/07/2021    Metabolic Disorder Labs:  No results found for: HGBA1C, MPG No results found for: PROLACTIN No results found for: CHOL, TRIG, HDL, CHOLHDL, VLDL, LDLCALC  See Psychiatric Specialty Exam and Suicide Risk Assessment completed by Attending Physician prior to discharge.  Discharge destination:  Home  Is patient on multiple antipsychotic therapies at discharge:  No   Has Patient had three or more failed trials of antipsychotic monotherapy by history:  No  Recommended Plan for Multiple Antipsychotic  Therapies: NA  Discharge Instructions     Diet - low sodium heart healthy   Complete by: As directed    Increase activity slowly   Complete by: As directed       Allergies as of 03/09/2021       Reactions   Atarax [hydroxyzine] Other (See Comments)   "makes me crazy"   Trazodone And Nefazodone Other (See Comments)   Has weird dreams        Medication List     STOP taking these medications    albuterol 108 (90 Base) MCG/ACT inhaler Commonly known as: VENTOLIN HFA   fluticasone 50 MCG/ACT nasal spray Commonly known as: FLONASE   guaiFENesin-codeine 100-10 MG/5ML syrup   ibuprofen 800 MG tablet Commonly known as: ADVIL   LORazepam 0.5 MG tablet Commonly known as: Ativan   oxyCODONE-acetaminophen 5-325 MG tablet Commonly known as: Percocet       TAKE these medications      Indication  atomoxetine 40 MG capsule Commonly known as: STRATTERA Take 1 capsule (40 mg total) by mouth daily. Start taking on: March 10, 2021  Indication: Attention Deficit Hyperactivity Disorder   hydrOXYzine 50 MG tablet Commonly known as: ATARAX/VISTARIL Take 1 tablet (50 mg total) by mouth 3 (three) times daily as needed for anxiety.  Indication: Feeling Anxious   mirtazapine 15 MG tablet Commonly known as: REMERON Take 1 tablet (15 mg total) by mouth at bedtime.  Indication: Major Depressive Disorder   prazosin 2 MG capsule Commonly known as: MINIPRESS Take 1 capsule (2 mg total) by mouth at bedtime.  Indication: Frightening Dreams        Follow-up Information     [facility] Call in 2 day(s).   Contact information: [address] [place] [address] [phone]                 Follow-up recommendations:  Activity:  as tolerated. Regular diet. Continue meds as prescribed and follow up with outpatient providers  Comments: Patient at this time is calm and does not appear to be suffering psychosis or major mood symptoms.  No longer  meets commitment criteria.  Responds well to education about treatment.  Agrees to discharge plan with follow-up in [place] home county  Signed: Mordecai Rasmussen, MD 03/09/2021, 12:25 PM

## 2021-03-09 NOTE — Progress Notes (Signed)
D: Pt alert and oriented. Pt denies experiencing any anxiety/depression at this time. Pt reports experiencing 9/10 chronic lower back pain, prn meds offered and refused. Pt states the tylenol doesn't work. Pt denies experiencing any SI/HI, or AVH at this time.   Pt has been asking to speak with the MD this morning. Pt reports she would like to discharge today, that she misses her kids.  A: Scheduled medications administered to pt, per MD orders. Support and encouragement provided. Frequent verbal contact made. Routine safety checks conducted q15 minutes.   R: No adverse drug reactions noted. Pt verbally contracts for safety at this time. Pt complaint with medications. Pt interacts minimally with others on the unit. Pt remains safe at this time. Will continue to monitor.

## 2021-03-09 NOTE — Progress Notes (Signed)
D: Pt alert and oriented. Pt denies experiencing any pain, SI/HI, or AVH at this time. Pt reports she will be able to keep herself safe when she returns home.   A: Pt received discharge and medication education/information. Pt belongings were returned and signed for at this time.   R: Pt verbalized understanding of discharge and medication education/information.  Pt escorted by staff to the medical mall front lobby where pt's husband picked her up.

## 2021-03-10 ENCOUNTER — Ambulatory Visit
Admission: EM | Admit: 2021-03-10 | Discharge: 2021-03-10 | Disposition: A | Discharge status: Home / Self Care | Payer: Medicaid Other | Attending: Emergency Medicine | Admitting: Emergency Medicine

## 2021-03-10 ENCOUNTER — Encounter: Payer: Self-pay | Admitting: Emergency Medicine

## 2021-03-10 DIAGNOSIS — R300 Dysuria: Secondary | ICD-10-CM | POA: Diagnosis not present

## 2021-03-10 DIAGNOSIS — R059 Cough, unspecified: Secondary | ICD-10-CM

## 2021-03-10 DIAGNOSIS — R350 Frequency of micturition: Secondary | ICD-10-CM

## 2021-03-10 LAB — POCT URINALYSIS DIP (MANUAL ENTRY)
Bilirubin, UA: NEGATIVE
Glucose, UA: NEGATIVE mg/dL
Ketones, POC UA: NEGATIVE mg/dL
Leukocytes, UA: NEGATIVE
Nitrite, UA: NEGATIVE
Protein Ur, POC: NEGATIVE mg/dL
Spec Grav, UA: >=1.03 — AB (ref 1.010–1.025)
Urobilinogen, UA: 0.2 E.U./dL
pH, UA: 5.5 (ref 5.0–8.0)

## 2021-03-10 MED ORDER — GUAIFENESIN-CODEINE 100-10 MG/5ML PO SOLN: 5.0000 mL | Freq: Three times a day (TID) | ORAL | 0 refills | Status: DC | PRN

## 2021-03-10 MED ORDER — PHENAZOPYRIDINE HCL 100 MG PO TABS: 100.0000 mg | ORAL_TABLET | Freq: Three times a day (TID) | ORAL | 0 refills | Status: DC | PRN

## 2021-03-10 NOTE — Discharge Instructions (Addendum)
Urine culture sent.  We will call you with the results.   Push fluids and get plenty of rest.   Guaifenesin/codeine was prescribed for cough Take pyridium as prescribed and as needed for symptomatic relief Follow up with PCP if symptoms persists Return here or go to ER if you have any new or worsening symptoms such as fever, worsening abdominal pain, nausea/vomiting, flank pain, etc..Marland Kitchen

## 2021-03-10 NOTE — ED Provider Notes (Signed)
[facility]    Chief Complaint  Patient presents with   Urinary Tract Infection   Cough     SUBJECTIVE:  Jessica [name] is a 30 y.o. female who presented to the urgent care for complaint of cough, dysuria, frequent urination and lower abdominal pain for the past few days.  Patient denies a precipitating event, recent sexual encounter, excessive caffeine intake.  Localizes the pain to the lower abdomen/ flank.  Pain is intermittent/ constant and describes it as achy in character.  Has tried OTC medications without relief.  Symptoms are made worse with urination.  Admits to similar symptoms in the past.  Denies fever, chills, nausea, vomiting,  abnormal vaginal discharge or bleeding, hematuria.    LMP: Patient's last menstrual period was 03/10/2021 (approximate).  ROS: As in HPI.  All other pertinent ROS negative.     Past Medical History:  Diagnosis Date   Abnormal Pap smear    ADHD (attention deficit hyperactivity disorder)    Anxiety    Chronic back pain    Depression    Migraine headache    UTI (lower urinary tract infection) 01/12/2013   Vaginal Pap smear, abnormal    Past Surgical History:  Procedure Laterality Date   CESAREAN SECTION N/A 09/08/2013   Procedure: CESAREAN SECTION;  Surgeon: Tereso Newcomer, MD;  Location: [facility];  Service: Obstetrics;  Laterality: N/A;   CESAREAN SECTION N/A 02/10/2018   Procedure: REPEAT CESAREAN SECTION;  Surgeon: Federico Flake, MD;  Location: [facility];  Service: Obstetrics;  Laterality: N/A;   TOOTH EXTRACTION     TUBAL LIGATION Bilateral 04/08/2018   Procedure: LAPAROSCOPIC BILATERAL TUBAL STERILIZATION WITH FALLOPE RING APPLICATION;  Surgeon: Tilda Burrow, MD;  Location: [facility];  Service: Gynecology;  Laterality: Bilateral;   Allergies  Allergen Reactions   Atarax [Hydroxyzine] Other (See Comments)    "makes me crazy"   Trazodone And Nefazodone Other (See Comments)    Has weird dreams   No current  facility-administered medications on file prior to encounter.   Current Outpatient Medications on File Prior to Encounter  Medication Sig Dispense Refill   atomoxetine (STRATTERA) 40 MG capsule Take 1 capsule (40 mg total) by mouth daily. 30 capsule 1   hydrOXYzine (ATARAX/VISTARIL) 50 MG tablet Take 1 tablet (50 mg total) by mouth 3 (three) times daily as needed for anxiety. 30 tablet 1   mirtazapine (REMERON) 15 MG tablet Take 1 tablet (15 mg total) by mouth at bedtime. 30 tablet 1   prazosin (MINIPRESS) 2 MG capsule Take 1 capsule (2 mg total) by mouth at bedtime. 30 capsule 1   Social History   Socioeconomic History   Marital status: Married    Spouse name: Not on file   Number of children: 2   Years of education: Not on file   Highest education level: Not on file  Occupational History   Not on file  Tobacco Use   Smoking status: Some Days    Packs/day: 1.00    Types: Cigarettes   Smokeless tobacco: Never  Vaping Use   Vaping Use: Never used  Substance and Sexual Activity   Alcohol use: Yes   Drug use: No   Sexual activity: Not Currently    Birth control/protection: Surgical    Comment: tubal  Other Topics Concern   Not on file  Social History Narrative   Not on file   Social Determinants of Health   Financial Resource Strain: Not on  file  Food Insecurity: Not on file  Transportation Needs: Not on file  Physical Activity: Not on file  Stress: Not on file  Social Connections: Not on file  Intimate Partner Violence: Not on file   Family History  Problem Relation Age of Onset   Drug abuse Mother     OBJECTIVE:  Vitals:   03/10/21 1228  Weight: 140 lb (63.5 kg)   General appearance: AOx3 in no acute distress HEENT: NCAT.  Oropharynx clear.  Lungs: clear to auscultation bilaterally without adventitious breath sounds Heart: regular rate and rhythm.  Radial pulses 2+ symmetrical bilaterally Abdomen: soft; non-distended; no tenderness; bowel sounds present;  no guarding or rebound tenderness Back: no CVA tenderness Extremities: no edema; symmetrical with no gross deformities Skin: warm and dry Neurologic: Ambulates from chair to exam table without difficulty Psychological: alert and cooperative; normal mood and affect  Labs Reviewed  POCT URINALYSIS DIP (MANUAL ENTRY) - Abnormal; Notable for the following components:      Result Value   Spec Grav, UA >=1.030 (*)    Blood, UA large (*)    All other components within normal limits  URINE CULTURE    ASSESSMENT & PLAN:  1. Dysuria   2. Frequent urination   3. Cough     Meds ordered this encounter  Medications   guaiFENesin-codeine 100-10 MG/5ML syrup    Sig: Take 5 mLs by mouth 3 (three) times daily as needed for cough.    Dispense:  90 mL    Refill:  0   phenazopyridine (PYRIDIUM) 100 MG tablet    Sig: Take 1 tablet (100 mg total) by mouth 3 (three) times daily as needed for pain.    Dispense:  10 tablet    Refill:  0     Discharge instructions  Urine culture sent.  We will call you with the results.   Push fluids and get plenty of rest.   Guaifenesin/codeine was prescribed for cough Take pyridium as prescribed and as needed for symptomatic relief Follow up with PCP if symptoms persists Return here or go to ER if you have any new or worsening symptoms such as fever, worsening abdominal pain, nausea/vomiting, flank pain, etc...  Outlined signs and symptoms indicating need for more acute intervention. Patient verbalized understanding. After Visit Summary given.      Durward Parcel, FNP 03/10/21 1329

## 2021-03-10 NOTE — ED Triage Notes (Signed)
UTI symptoms that started last night, bad cough for a few days, and facial swelling.  No fevers.

## 2021-03-12 LAB — URINE CULTURE: Culture: 50000 — AB

## 2021-03-14 ENCOUNTER — Ambulatory Visit: Payer: Medicaid Other | Admitting: Advanced Practice Midwife

## 2021-03-25 ENCOUNTER — Ambulatory Visit
Admission: EM | Admit: 2021-03-25 | Discharge: 2021-03-25 | Disposition: A | Discharge status: Home / Self Care | Payer: Medicaid Other | Attending: Family Medicine | Admitting: Family Medicine

## 2021-03-25 ENCOUNTER — Other Ambulatory Visit: Payer: Self-pay

## 2021-03-25 ENCOUNTER — Encounter: Payer: Self-pay | Admitting: Emergency Medicine

## 2021-03-25 DIAGNOSIS — J069 Acute upper respiratory infection, unspecified: Secondary | ICD-10-CM

## 2021-03-25 DIAGNOSIS — H1131 Conjunctival hemorrhage, right eye: Secondary | ICD-10-CM

## 2021-03-25 DIAGNOSIS — T07XXXA Unspecified multiple injuries, initial encounter: Secondary | ICD-10-CM

## 2021-03-25 DIAGNOSIS — H02843 Edema of right eye, unspecified eyelid: Secondary | ICD-10-CM

## 2021-03-25 DIAGNOSIS — S0511XA Contusion of eyeball and orbital tissues, right eye, initial encounter: Secondary | ICD-10-CM

## 2021-03-25 MED ORDER — PREDNISONE 20 MG PO TABS: 40.0000 mg | ORAL_TABLET | Freq: Every day | ORAL | 0 refills | Status: DC

## 2021-03-25 MED ORDER — PROMETHAZINE-DM 6.25-15 MG/5ML PO SYRP: 5.0000 mL | ORAL_SOLUTION | Freq: Four times a day (QID) | ORAL | 0 refills | Status: DC | PRN

## 2021-03-25 NOTE — ED Triage Notes (Signed)
Sore throat and cough x 3 days

## 2021-03-25 NOTE — Discharge Instructions (Addendum)
Apply warm compresses to your right eye to reduce swelling in tenderness.  I have also prescribed you prednisone 40 mg once daily this will help reduce the swelling surrounding your eyelid and help reduce the pain. Promethazine DM for cough.  Recommend over-the-counter cetirizine or Allegra to help with nasal symptoms. Suspect postnasal drainage is causing your soreness of throat recommend gargling with warm salt water.
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
## 2021-04-28 ENCOUNTER — Ambulatory Visit
Admission: EM | Admit: 2021-04-28 | Discharge: 2021-04-28 | Disposition: A | Discharge status: Home / Self Care | Payer: Medicaid Other | Attending: Family Medicine | Admitting: Family Medicine

## 2021-04-28 ENCOUNTER — Other Ambulatory Visit: Payer: Self-pay

## 2021-04-28 ENCOUNTER — Encounter: Payer: Self-pay | Admitting: Emergency Medicine

## 2021-04-28 DIAGNOSIS — Z20822 Contact with and (suspected) exposure to covid-19: Secondary | ICD-10-CM

## 2021-04-28 DIAGNOSIS — J069 Acute upper respiratory infection, unspecified: Secondary | ICD-10-CM

## 2021-04-28 MED ORDER — PREDNISONE 20 MG PO TABS: 20.0000 mg | ORAL_TABLET | Freq: Every day | ORAL | 0 refills | Status: AC

## 2021-04-28 NOTE — ED Triage Notes (Signed)
Cough, sore throat and headache since Friday.

## 2021-04-28 NOTE — Discharge Instructions (Signed)
Your COVID 19 results should result within 2-3 days. Negative results are immediately resulted to Mychart. Positive results will receive a follow-up call from our clinic. If symptoms are present, I recommend home quarantine until results are known.  Alternate Tylenol and ibuprofen as needed for body aches and fever.  Symptom management per recommendations discussed today.  If any breathing difficulty or chest pain develops go immediately to the closest emergency department for evaluation.  
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
## 2021-04-29 ENCOUNTER — Telehealth: Payer: Self-pay | Admitting: Emergency Medicine

## 2021-04-29 LAB — COVID-19, FLU A+B NAA
Influenza A, NAA: NOT DETECTED
Influenza B, NAA: NOT DETECTED
SARS-CoV-2, NAA: DETECTED — AB

## 2021-04-29 MED ORDER — ALBUTEROL SULFATE HFA 108 (90 BASE) MCG/ACT IN AERS: 1.0000 | INHALATION_SPRAY | Freq: Four times a day (QID) | RESPIRATORY_TRACT | 0 refills | Status: DC | PRN

## 2021-05-01 ENCOUNTER — Encounter: Payer: Self-pay | Admitting: *Deleted

## 2021-05-01 ENCOUNTER — Other Ambulatory Visit: Payer: Self-pay

## 2021-05-01 ENCOUNTER — Ambulatory Visit
Admission: EM | Admit: 2021-05-01 | Discharge: 2021-05-01 | Disposition: A | Discharge status: Home / Self Care | Payer: Medicaid Other | Attending: Physician Assistant | Admitting: Physician Assistant

## 2021-05-01 DIAGNOSIS — U071 COVID-19: Secondary | ICD-10-CM | POA: Diagnosis not present

## 2021-05-01 MED ORDER — ALBUTEROL SULFATE HFA 108 (90 BASE) MCG/ACT IN AERS: 1.0000 | INHALATION_SPRAY | Freq: Four times a day (QID) | RESPIRATORY_TRACT | 0 refills | Status: DC | PRN

## 2021-05-01 MED ORDER — BENZONATATE 100 MG PO CAPS: 100.0000 mg | ORAL_CAPSULE | Freq: Four times a day (QID) | ORAL | 0 refills | Status: DC | PRN
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
## 2021-05-01 NOTE — ED Triage Notes (Signed)
Pt continues to have cough,sore throat .

## 2021-05-17 ENCOUNTER — Other Ambulatory Visit: Payer: Self-pay

## 2021-05-17 ENCOUNTER — Encounter: Payer: Self-pay | Admitting: Emergency Medicine

## 2021-05-17 ENCOUNTER — Ambulatory Visit
Admission: EM | Admit: 2021-05-17 | Discharge: 2021-05-17 | Disposition: A | Discharge status: Home / Self Care | Payer: Medicaid Other | Attending: Family Medicine | Admitting: Family Medicine

## 2021-05-17 DIAGNOSIS — R051 Acute cough: Secondary | ICD-10-CM

## 2021-05-17 MED ORDER — PROMETHAZINE-DM 6.25-15 MG/5ML PO SYRP: 5.0000 mL | ORAL_SOLUTION | Freq: Four times a day (QID) | ORAL | 0 refills | Status: DC | PRN

## 2021-05-17 MED ORDER — PREDNISONE 20 MG PO TABS: 40.0000 mg | ORAL_TABLET | Freq: Every day | ORAL | 0 refills | Status: DC

## 2021-05-17 NOTE — ED Triage Notes (Signed)
Cough that is worse at night time x 3 days.
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
## 2021-05-22 ENCOUNTER — Other Ambulatory Visit: Payer: Self-pay

## 2021-05-22 ENCOUNTER — Ambulatory Visit
Admission: EM | Admit: 2021-05-22 | Discharge: 2021-05-22 | Disposition: A | Discharge status: Home / Self Care | Payer: Medicaid Other

## 2021-05-22 ENCOUNTER — Ambulatory Visit (HOSPITAL_COMMUNITY)
Admission: EM | Admit: 2021-05-22 | Discharge: 2021-05-22 | Discharge status: Against Medical Advice | Payer: No Typology Code available for payment source

## 2021-05-22 DIAGNOSIS — F411 Generalized anxiety disorder: Secondary | ICD-10-CM | POA: Diagnosis not present

## 2021-05-22 NOTE — ED Triage Notes (Signed)
Patient presents to Urgent Care with complaints of panic attacks x 3-4 days ago. Has a hx of anxiety. Not taking any meds for anxiety. Pt states she has been dealing with increase stress over these past few days.
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
## 2021-05-22 NOTE — Discharge Instructions (Addendum)
Go to  [facility] [address], [place] [address]  [phone]

## 2021-05-27 ENCOUNTER — Telehealth (HOSPITAL_COMMUNITY): Payer: Self-pay | Admitting: Family Medicine

## 2021-05-27 NOTE — BH Assessment (Signed)
Care Management - Follow Up [facility] Discharges   Writer attempted to make contact with patient today and was unsuccessful.  Writer was not able to leave a HIPPA compliant voice message because the patient voice mail box is full.   

## 2021-05-29 ENCOUNTER — Emergency Department (HOSPITAL_COMMUNITY)
Admission: EM | Admit: 2021-05-29 | Discharge: 2021-05-29 | Disposition: A | Discharge status: Home / Self Care | Payer: Medicaid Other | Attending: Emergency Medicine | Admitting: Emergency Medicine

## 2021-05-29 ENCOUNTER — Encounter (HOSPITAL_COMMUNITY): Payer: Self-pay

## 2021-05-29 ENCOUNTER — Other Ambulatory Visit: Payer: Self-pay

## 2021-05-29 DIAGNOSIS — K029 Dental caries, unspecified: Secondary | ICD-10-CM | POA: Diagnosis not present

## 2021-05-29 DIAGNOSIS — K0889 Other specified disorders of teeth and supporting structures: Secondary | ICD-10-CM | POA: Diagnosis present

## 2021-05-29 DIAGNOSIS — F17210 Nicotine dependence, cigarettes, uncomplicated: Secondary | ICD-10-CM | POA: Diagnosis not present

## 2021-05-29 DIAGNOSIS — R059 Cough, unspecified: Secondary | ICD-10-CM | POA: Diagnosis not present

## 2021-05-29 MED ORDER — KETOROLAC TROMETHAMINE 10 MG PO TABS
10.0000 mg | ORAL_TABLET | Freq: Once | ORAL | Status: DC
  Filled 2021-05-29: qty 1

## 2021-05-29 MED ORDER — PENICILLIN V POTASSIUM 500 MG PO TABS: 500.0000 mg | ORAL_TABLET | Freq: Four times a day (QID) | ORAL | 0 refills | Status: DC

## 2021-05-29 MED ORDER — NAPROXEN 500 MG PO TABS
500.0000 mg | ORAL_TABLET | Freq: Two times a day (BID) | ORAL | 0 refills | Status: DC
Start: 2021-05-29 — End: 2021-12-12

## 2021-05-29 NOTE — ED Notes (Addendum)
Pt with dental pain for 2 weeks.  Seen dentist to have cavity filled and per pt the filling fell out 2 days after having it done.  Pt refused to see the dentist again that did the work on her teeth.

## 2021-05-29 NOTE — Discharge Instructions (Signed)
You may follow up with Dr. Sonda Rumble: [phone]  [address] [place], [place] [address]  Penicillin for possible infection, Naprosyn twice a day for pain
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
## 2021-05-29 NOTE — ED Triage Notes (Addendum)
Pt complaining on dental pain to right front tooth. Pt states that she had a cavity last year and got it filled and that they "messed it up". Pt complaining of sensitivity to tooth x1 year.

## 2021-06-24 ENCOUNTER — Telehealth: Payer: Self-pay | Admitting: Physician Assistant

## 2021-06-24 DIAGNOSIS — J069 Acute upper respiratory infection, unspecified: Secondary | ICD-10-CM

## 2021-06-24 MED ORDER — BENZONATATE 100 MG PO CAPS
100.0000 mg | ORAL_CAPSULE | Freq: Three times a day (TID) | ORAL | 0 refills | Status: DC | PRN
Start: 2021-06-24 — End: 2021-09-01

## 2021-06-24 MED ORDER — IPRATROPIUM BROMIDE 0.03 % NA SOLN: 2.0000 | Freq: Two times a day (BID) | NASAL | 0 refills | Status: DC

## 2021-06-24 MED ORDER — FLUTICASONE PROPIONATE 50 MCG/ACT NA SUSP: 2.0000 | Freq: Every day | NASAL | 0 refills | Status: DC

## 2021-06-24 MED ORDER — PSEUDOEPH-BROMPHEN-DM 30-2-10 MG/5ML PO SYRP
5.0000 mL | ORAL_SOLUTION | Freq: Four times a day (QID) | ORAL | 0 refills | Status: DC | PRN
Start: 2021-06-24 — End: 2022-09-23

## 2021-06-24 NOTE — Patient Instructions (Signed)
Rocky Morel, thank you for joining Margaretann Loveless, PA-C for today's virtual visit.  While this provider is not your primary care provider (PCP), if your PCP is located in our provider database this encounter information will be shared with them immediately following your visit.  Consent: (Patient) Jessica [name] provided verbal consent for this virtual visit at the beginning of the encounter.  Current Medications:  Current Outpatient Medications:    benzonatate (TESSALON) 100 MG capsule, Take 1 capsule (100 mg total) by mouth 3 (three) times daily as needed., Disp: 30 capsule, Rfl: 0   brompheniramine-pseudoephedrine-DM 30-2-10 MG/5ML syrup, Take 5 mLs by mouth 4 (four) times daily as needed., Disp: 120 mL, Rfl: 0   fluticasone (FLONASE) 50 MCG/ACT nasal spray, Place 2 sprays into both nostrils daily., Disp: 16 g, Rfl: 0   ipratropium (ATROVENT) 0.03 % nasal spray, Place 2 sprays into both nostrils every 12 (twelve) hours., Disp: 30 mL, Rfl: 0   Medications ordered in this encounter:  Meds ordered this encounter  Medications   brompheniramine-pseudoephedrine-DM 30-2-10 MG/5ML syrup    Sig: Take 5 mLs by mouth 4 (four) times daily as needed.    Dispense:  120 mL    Refill:  0    Order Specific Question:   Supervising Provider    Answer:   MILLER, BRIAN [[id number]]   fluticasone (FLONASE) 50 MCG/ACT nasal spray    Sig: Place 2 sprays into both nostrils daily.    Dispense:  16 g    Refill:  0    Order Specific Question:   Supervising Provider    Answer:   MILLER, BRIAN [[id number]]   benzonatate (TESSALON) 100 MG capsule    Sig: Take 1 capsule (100 mg total) by mouth 3 (three) times daily as needed.    Dispense:  30 capsule    Refill:  0    Order Specific Question:   Supervising Provider    Answer:   MILLER, BRIAN [[id number]]   ipratropium (ATROVENT) 0.03 % nasal spray    Sig: Place 2 sprays into both nostrils every 12 (twelve) hours.    Dispense:  30 mL    Refill:  0     Order Specific Question:   Supervising Provider    Answer:   Hyacinth Meeker, BRIAN [[id number]]     *If you need refills on other medications prior to your next appointment, please contact your pharmacy*  Follow-Up: Call back or seek an in-person evaluation if the symptoms worsen or if the condition fails to improve as anticipated.  Other Instructions Upper Respiratory Infection, Adult An upper respiratory infection (URI) affects the nose, throat, and upper airways that lead to the lungs. The most common type of URI is often called the common cold. URIs usually get better on their own, without medical treatment. What are the causes? A URI is caused by a germ (virus). You may catch these germs by: Breathing in droplets from an infected person's cough or sneeze. Touching something that has the germ on it (is contaminated) and then touching your mouth, nose, or eyes. What increases the risk? You are more likely to get a URI if: You are very young or very old. You have close contact with others, such as at work, school, or a health care facility. You smoke. You have long-term (chronic) heart or lung disease. You have a weakened disease-fighting system (immune system). You have nasal allergies or asthma. You have a lot of stress. You have poor nutrition. What  are the signs or symptoms? Runny or stuffy (congested) nose. Cough. Sneezing. Sore throat. Headache. Feeling tired (fatigue). Fever. Not wanting to eat as much as usual. Pain in your forehead, behind your eyes, and over your cheekbones (sinus pain). Muscle aches. Redness or irritation of the eyes. Pressure in the ears or face. How is this treated? URIs usually get better on their own within 7-10 days. Medicines cannot cure URIs, but your doctor may recommend certain medicines to help relieve symptoms, such as: Over-the-counter cold medicines. Medicines to reduce coughing (cough suppressants). Coughing is a type of defense against  infection that helps to clear the nose, throat, windpipe, and lungs (respiratory system). Take these medicines only as told by your doctor. Medicines to lower your fever. Follow these instructions at home: Activity Rest as needed. If you have a fever, stay home from work or school until your fever is gone, or until your doctor says you may return to work or school. You should stay home until you cannot spread the infection anymore (you are not contagious). Your doctor may have you wear a face mask so you have less risk of spreading the infection. Relieving symptoms Rinse your mouth often with salt water. To make salt water, dissolve -1 tsp (3-6 g) of salt in 1 cup (237 mL) of warm water. Use a cool-mist humidifier to add moisture to the air. This can help you breathe more easily. Eating and drinking  Drink enough fluid to keep your pee (urine) pale yellow. Eat soups and other clear broths. General instructions  Take over-the-counter and prescription medicines only as told by your doctor. Do not smoke or use any products that contain nicotine or tobacco. If you need help quitting, ask your doctor. Avoid being where people are smoking (avoid secondhand smoke). Stay up to date on all your shots (immunizations), and get the flu shot every year. Keep all follow-up visits. How to prevent the spread of infection to others  Wash your hands with soap and water for at least 20 seconds. If you cannot use soap and water, use hand sanitizer. Avoid touching your mouth, face, eyes, or nose. Cough or sneeze into a tissue or your sleeve or elbow. Do not cough or sneeze into your hand or into the air. Contact a doctor if: You are getting worse, not better. You have any of these: A fever or chills. Brown or red mucus in your nose. Yellow or brown fluid (discharge)coming from your nose. Pain in your face, especially when you bend forward. Swollen neck glands. Pain when you swallow. White areas in  the back of your throat. Get help right away if: You have shortness of breath that gets worse. You have very bad or constant: Headache. Ear pain. Pain in your forehead, behind your eyes, and over your cheekbones (sinus pain). Chest pain. You have long-lasting (chronic) lung disease along with any of these: Making high-pitched whistling sounds when you breathe, most often when you breathe out (wheezing). Long-lasting cough (more than 14 days). Coughing up blood. A change in your usual mucus. You have a stiff neck. You have changes in your: Vision. Hearing. Thinking. Mood. These symptoms may be an emergency. Get help right away. Call 911. Do not wait to see if the symptoms will go away. Do not drive yourself to the hospital. Summary An upper respiratory infection (URI) is caused by a germ (virus). The most common type of URI is often called the common cold. URIs usually get better within 7-10  days. Take over-the-counter and prescription medicines only as told by your doctor. This information is not intended to replace advice given to you by your health care provider. Make sure you discuss any questions you have with your health care provider. Document Revised: 02/27/2021 Document Reviewed: 02/27/2021 Elsevier Patient Education  2022 [employer].    If you have been instructed to have an in-person evaluation today at a local Urgent Care facility, please use the link below. It will take you to a list of all of our available [facility], including address, phone number and hours of operation. Please do not delay care.  [facility]  If you or a family member do not have a primary care provider, use the link below to schedule a visit and establish care. When you choose a [facility] primary care physician or advanced practice provider, you gain a long-term partner in health. Find a Primary Care Provider  Learn more about [facility] in-office and virtual  care options:  - Get Care Now

## 2021-06-24 NOTE — Progress Notes (Signed)
Virtual Visit Consent   Jessica [name], you are scheduled for a virtual visit with a [facility] provider today.     Just as with appointments in the office, your consent must be obtained to participate.  Your consent will be active for this visit and any virtual visit you may have with one of our providers in the next 365 days.     If you have a MyChart account, a copy of this consent can be sent to you electronically.  All virtual visits are billed to your insurance company just like a traditional visit in the office.    As this is a virtual visit, video technology does not allow for your provider to perform a traditional examination.  This may limit your provider's ability to fully assess your condition.  If your provider identifies any concerns that need to be evaluated in person or the need to arrange testing (such as labs, EKG, etc.), we will make arrangements to do so.     Although advances in technology are sophisticated, we cannot ensure that it will always work on either your end or our end.  If the connection with a video visit is poor, the visit may have to be switched to a telephone visit.  With either a video or telephone visit, we are not always able to ensure that we have a secure connection.     I need to obtain your verbal consent now.   Are you willing to proceed with your visit today?    Jessica [name] has provided verbal consent on 06/24/2021 for a virtual visit (video or telephone).   Jessica Loveless, PA-C   Date: 06/24/2021 2:16 PM   Virtual Visit via Video Note   I, Jessica [name], connected with  Jessica [name]  ([id number], 03/16/1988) on 06/24/21 at  2:00 PM EST by a video-enabled telemedicine application and verified that I am speaking with the correct person using two identifiers.  Location: Patient: Virtual Visit Location Patient: Home Provider: Virtual Visit Location Provider: Home Office   I discussed the limitations of evaluation  and management by telemedicine and the availability of in person appointments. The patient expressed understanding and agreed to proceed.    History of Present Illness: Jessica [name] is a 30 y.o. who identifies as a female who was assigned female at birth, and is being seen today for cough.  HPI: Cough This is a new problem. The current episode started yesterday. The problem has been gradually worsening. The problem occurs hourly. The cough is Non-productive. Associated symptoms include chest pain, chills, headaches (last night), nasal congestion, postnasal drip, rhinorrhea and a sore throat. Pertinent negatives include no fever, myalgias or wheezing. The symptoms are aggravated by lying down. She has tried nothing for the symptoms. The treatment provided no relief. Her past medical history is significant for bronchitis.     Problems: There are no problems to display for this patient.   Allergies: Not on File Medications:  Current Outpatient Medications:    benzonatate (TESSALON) 100 MG capsule, Take 1 capsule (100 mg total) by mouth 3 (three) times daily as needed., Disp: 30 capsule, Rfl: 0   brompheniramine-pseudoephedrine-DM 30-2-10 MG/5ML syrup, Take 5 mLs by mouth 4 (four) times daily as needed., Disp: 120 mL, Rfl: 0   fluticasone (FLONASE) 50 MCG/ACT nasal spray, Place 2 sprays into both nostrils daily., Disp: 16 g, Rfl: 0   ipratropium (ATROVENT) 0.03 % nasal spray, Place 2 sprays into both  nostrils every 12 (twelve) hours., Disp: 30 mL, Rfl: 0  Observations/Objective: Patient is well-developed, well-nourished in no acute distress.  Resting comfortably at home.  Head is normocephalic, atraumatic.  No labored breathing.  Speech is clear and coherent with logical content.  Patient is alert and oriented at baseline.    Assessment and Plan: 1. Viral URI with cough - brompheniramine-pseudoephedrine-DM 30-2-10 MG/5ML syrup; Take 5 mLs by mouth 4 (four) times daily as needed.   Dispense: 120 mL; Refill: 0 - fluticasone (FLONASE) 50 MCG/ACT nasal spray; Place 2 sprays into both nostrils daily.  Dispense: 16 g; Refill: 0 - benzonatate (TESSALON) 100 MG capsule; Take 1 capsule (100 mg total) by mouth 3 (three) times daily as needed.  Dispense: 30 capsule; Refill: 0 - ipratropium (ATROVENT) 0.03 % nasal spray; Place 2 sprays into both nostrils every 12 (twelve) hours.  Dispense: 30 mL; Refill: 0  - Treat symptomatically with OTC medications of choice - Bromfed DM and tessalon perles for cough - Flonase and ipratropium for nasal congestion and drainage - Seek in person evaluation if not improving or worsening  Follow Up Instructions: I discussed the assessment and treatment plan with the patient. The patient was provided an opportunity to ask questions and all were answered. The patient agreed with the plan and demonstrated an understanding of the instructions.  A copy of instructions were sent to the patient via MyChart unless otherwise noted below.    The patient was advised to call back or seek an in-person evaluation if the symptoms worsen or if the condition fails to improve as anticipated.  Time:  I spent 12 minutes with the patient via telehealth technology discussing the above problems/concerns.    Jessica Loveless, PA-C

## 2021-06-25 ENCOUNTER — Emergency Department (HOSPITAL_COMMUNITY)
Admission: EM | Admit: 2021-06-25 | Discharge: 2021-06-25 | Disposition: A | Discharge status: Home / Self Care | Payer: Medicaid Other | Attending: Emergency Medicine | Admitting: Emergency Medicine

## 2021-06-25 ENCOUNTER — Encounter (HOSPITAL_COMMUNITY): Payer: Self-pay | Admitting: *Deleted

## 2021-06-25 DIAGNOSIS — M542 Cervicalgia: Secondary | ICD-10-CM | POA: Insufficient documentation

## 2021-06-25 DIAGNOSIS — J111 Influenza due to unidentified influenza virus with other respiratory manifestations: Secondary | ICD-10-CM | POA: Diagnosis not present

## 2021-06-25 DIAGNOSIS — Z20822 Contact with and (suspected) exposure to covid-19: Secondary | ICD-10-CM | POA: Diagnosis not present

## 2021-06-25 DIAGNOSIS — F17210 Nicotine dependence, cigarettes, uncomplicated: Secondary | ICD-10-CM | POA: Diagnosis not present

## 2021-06-25 DIAGNOSIS — R059 Cough, unspecified: Secondary | ICD-10-CM | POA: Diagnosis present

## 2021-06-25 LAB — RESP PANEL BY RT-PCR (FLU A&B, COVID) ARPGX2
Influenza A by PCR: NEGATIVE
Influenza B by PCR: NEGATIVE
SARS Coronavirus 2 by RT PCR: NEGATIVE

## 2021-06-25 MED ORDER — NAPROXEN 250 MG PO TABS
500.0000 mg | ORAL_TABLET | Freq: Once | ORAL | Status: AC
  Administered 2021-06-25: 500 mg via ORAL
  Filled 2021-06-25: qty 2

## 2021-06-25 MED ORDER — ALBUTEROL SULFATE HFA 108 (90 BASE) MCG/ACT IN AERS: 1.0000 | INHALATION_SPRAY | Freq: Four times a day (QID) | RESPIRATORY_TRACT | 0 refills | Status: DC | PRN
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
## 2021-06-25 NOTE — ED Notes (Signed)
Left before getting discharged from Dr Hyacinth Meeker.  Pt got her AVS and her 2 kids papers and left facility.

## 2021-06-25 NOTE — Discharge Instructions (Signed)
Please take 2 puffs of albuterol every 4 hours for the first 24 hours, then every 4 hours as needed.  This medicine will help to open up your lungs and allow you breathe easier, cough less and have less tightness in your chest.  This medicine can be taken with you - it is small and portable.    Tylenol or ibuprofen for fever, ER for severe worsening symptom

## 2021-06-25 NOTE — ED Triage Notes (Signed)
Cough with flu symptoms for a week. Also has pain in back of neck

## 2021-07-01 ENCOUNTER — Telehealth: Payer: Self-pay | Admitting: Family Medicine

## 2021-07-01 ENCOUNTER — Encounter (HOSPITAL_COMMUNITY): Payer: Self-pay | Admitting: *Deleted

## 2021-07-01 DIAGNOSIS — J0140 Acute pansinusitis, unspecified: Secondary | ICD-10-CM

## 2021-07-01 DIAGNOSIS — R051 Acute cough: Secondary | ICD-10-CM | POA: Insufficient documentation

## 2021-07-01 MED ORDER — AMOXICILLIN-POT CLAVULANATE 875-125 MG PO TABS: 1.0000 | ORAL_TABLET | Freq: Two times a day (BID) | ORAL | 0 refills | Status: AC

## 2021-07-01 NOTE — Patient Instructions (Addendum)
Pleas take medication as directed   Today you were seen for URI/Sinus Infection.  Please take the prescribed medication as directed and complete the full dose to prevent reinfection.  Please eat yogurt or take a probiotic if possible to avoid a yeast infection (this is common) from antibiotic. Should you develop a infection call the office or send a message in MyChart to let us know. We will address it quickly.  As suggested for symptom management use Nasal saline spray to help ease congestion and dry nasal passages. Can be used throughout the day as needed. Avoid forceful blowing of nose. It will be common to have some blood if blowing nose a lot or if nose is very dry. Can you a humidifier at home as well. Remember to wash and air it out regularly. Tylenol (325 mg 2 tablets every 6 hours) and or Ibuprofen (200- 400 mg every 8 hours) for fever, sore throat and body aches. Can consider use of a Neti-pot to wash out sinus cavity (twice daily). Please be aware that you need to use distilled or boiled water for these. If congestion is increasing and or coughing can use Mucinex (twice daily) for cough and congestion with full glass of water.  If you have a sore throat warm fluids like hot tea or lemon water can help sooth this.    Please hydrate, rest, get fresh air daily and wash your hands well.  I hope you feel better soon.

## 2021-07-01 NOTE — Progress Notes (Signed)
Virtual Visit Consent   Karren Newland, you are scheduled for a virtual visit with a [facility] provider today.     Just as with appointments in the office, your consent must be obtained to participate.  Your consent will be active for this visit and any virtual visit you may have with one of our providers in the next 365 days.     If you have a MyChart account, a copy of this consent can be sent to you electronically.  All virtual visits are billed to your insurance company just like a traditional visit in the office.    As this is a virtual visit, video technology does not allow for your provider to perform a traditional examination.  This may limit your provider's ability to fully assess your condition.  If your provider identifies any concerns that need to be evaluated in person or the need to arrange testing (such as labs, EKG, etc.), we will make arrangements to do so.     Although advances in technology are sophisticated, we cannot ensure that it will always work on either your end or our end.  If the connection with a video visit is poor, the visit may have to be switched to a telephone visit.  With either a video or telephone visit, we are not always able to ensure that we have a secure connection.     I need to obtain your verbal consent now.   Are you willing to proceed with your visit today?    Nealie Mchatton has provided verbal consent on 07/01/2021 for a virtual visit (video or telephone).   Freddy Finner, NP   Date: 07/01/2021 12:18 PM   Virtual Visit via Video Note   I, Freddy Finner, connected with  Georgette Helmer  ([id number], [birth year]) on 07/01/21 at 12:15 PM EST by a video-enabled telemedicine application and verified that I am speaking with the correct person using two identifiers.  Location: Patient: Virtual Visit Location Patient: Home Provider: Virtual Visit Location Provider: Home Office   I discussed the limitations of evaluation and  management by telemedicine and the availability of in person appointments. The patient expressed understanding and agreed to proceed.    History of Present Illness: Jessica [name] is a 30 y.o. who identifies as a female who was assigned female at birth, and is being seen today for : reports being seen for viral URI on 06/24/21- needs to have chart merged   HPI: Sinusitis This is a new problem. The current episode started 1 to 4 weeks ago. The problem has been gradually worsening since onset. There has been no fever. Associated symptoms include congestion, coughing, headaches, a hoarse voice, sinus pressure and a sore throat. Pertinent negatives include no chills. Past treatments include spray decongestants (perles, flonase, OTC meds). The treatment provided no relief.  Cough This is a new problem. The current episode started 1 to 4 weeks ago. The problem has been unchanged. The problem occurs constantly. The cough is Non-productive. Associated symptoms include headaches, nasal congestion, postnasal drip, rhinorrhea and a sore throat. Pertinent negatives include no chest pain or chills. She has tried OTC cough suppressant, OTC inhaler and prescription cough suppressant for the symptoms. The treatment provided no relief.    No exposure to flu she is aware of   Problems: There are no problems to display for this patient.   Allergies: Not on File Medications: No current outpatient medications on file.  Observations/Objective: Patient is  well-developed, well-nourished in no acute distress.  Resting comfortably  at home.  Head is normocephalic, atraumatic.  No labored breathing.  Speech is clear and coherent with logical content.  Patient is alert and oriented at baseline.  Cough dry and constant   Assessment and Plan: 1. Upper respiratory tract infection, unspecified type S&S consistent- will tx with Aug with duration and worsening of S&S  OTC reviewed Cough measures reviewed No  breastfeeding or pregnant  - amoxicillin-clavulanate (AUGMENTIN) 875-125 MG tablet; Take 1 tablet by mouth 2 (two) times daily for 7 days.  Dispense: 14 tablet; Refill: 0   Reviewed side effects, risks and benefits of medication.    Patient acknowledged agreement and understanding of the plan.  I discussed the assessment and treatment plan with the patient. The patient was provided an opportunity to ask questions and all were answered. The patient agreed with the plan and demonstrated an understanding of the instructions.   The patient was advised to call back or seek an in-person evaluation if the symptoms worsen or if the condition fails to improve as anticipated.   The above assessment and management plan was discussed with the patient. The patient verbalized understanding of and has agreed to the management plan. Patient is aware to call the clinic if symptoms persist or worsen. Patient is aware when to return to the clinic for a follow-up visit. Patient educated on when it is appropriate to go to the emergency department.     Follow Up Instructions: I discussed the assessment and treatment plan with the patient. The patient was provided an opportunity to ask questions and all were answered. The patient agreed with the plan and demonstrated an understanding of the instructions.  A copy of instructions were sent to the patient via MyChart unless otherwise noted below.  The patient was advised to call back or seek an in-person evaluation if the symptoms worsen or if the condition fails to improve as anticipated.  Time:  I spent 10 minutes with the patient via telehealth technology discussing the above problems/concerns.    Freddy Finner, NP

## 2021-08-01 ENCOUNTER — Ambulatory Visit
Admission: EM | Admit: 2021-08-01 | Discharge: 2021-08-01 | Disposition: A | Discharge status: Home / Self Care | Payer: Medicaid Other | Attending: Family Medicine | Admitting: Family Medicine

## 2021-08-01 ENCOUNTER — Other Ambulatory Visit: Payer: Self-pay

## 2021-08-01 ENCOUNTER — Encounter: Payer: Self-pay | Admitting: Emergency Medicine

## 2021-08-01 DIAGNOSIS — J069 Acute upper respiratory infection, unspecified: Secondary | ICD-10-CM | POA: Diagnosis not present

## 2021-08-01 MED ORDER — PREDNISONE 20 MG PO TABS: 40.0000 mg | ORAL_TABLET | Freq: Every day | ORAL | 0 refills | Status: DC

## 2021-08-01 MED ORDER — HYDROCODONE BIT-HOMATROP MBR 5-1.5 MG/5ML PO SOLN: 5.0000 mL | Freq: Four times a day (QID) | ORAL | 0 refills | Status: DC | PRN

## 2021-08-01 MED ORDER — HYDROCODONE BIT-HOMATROP MBR 5-1.5 MG/5ML PO SOLN
5.0000 mL | Freq: Four times a day (QID) | ORAL | 0 refills | Status: DC | PRN
Start: 2021-08-01 — End: 2021-08-01

## 2021-08-01 MED ORDER — AZITHROMYCIN 250 MG PO TABS: 250.0000 mg | ORAL_TABLET | Freq: Every day | ORAL | 0 refills | Status: DC
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
## 2021-08-01 NOTE — ED Triage Notes (Signed)
PT reports cough for 1 week

## 2021-09-01 ENCOUNTER — Ambulatory Visit (INDEPENDENT_AMBULATORY_CARE_PROVIDER_SITE_OTHER): Payer: Medicaid Other

## 2021-09-01 ENCOUNTER — Other Ambulatory Visit: Payer: Self-pay

## 2021-09-01 ENCOUNTER — Ambulatory Visit
Admission: EM | Admit: 2021-09-01 | Discharge: 2021-09-01 | Disposition: A | Discharge status: Home / Self Care | Payer: Medicaid Other | Attending: Urgent Care | Admitting: Urgent Care

## 2021-09-01 DIAGNOSIS — R059 Cough, unspecified: Secondary | ICD-10-CM

## 2021-09-01 DIAGNOSIS — R053 Chronic cough: Secondary | ICD-10-CM | POA: Diagnosis not present

## 2021-09-01 DIAGNOSIS — R0789 Other chest pain: Secondary | ICD-10-CM | POA: Diagnosis not present

## 2021-09-01 LAB — POCT URINALYSIS DIP (MANUAL ENTRY)
Bilirubin, UA: NEGATIVE
Blood, UA: NEGATIVE
Glucose, UA: NEGATIVE mg/dL
Ketones, POC UA: NEGATIVE mg/dL
Nitrite, UA: NEGATIVE
Protein Ur, POC: 30 mg/dL — AB
Spec Grav, UA: 1.025 (ref 1.010–1.025)
Urobilinogen, UA: 0.2 E.U./dL
pH, UA: 7 (ref 5.0–8.0)

## 2021-09-01 LAB — POCT URINE PREGNANCY: Preg Test, Ur: NEGATIVE

## 2021-09-01 NOTE — ED Triage Notes (Signed)
Pt report left sided lower back pain and pain under left breath coughing x 1 day.

## 2021-09-01 NOTE — ED Provider Notes (Signed)
[facility]   MRN: [id number] DOB: 04-11-1991  Subjective:   Jessica [name] is a 31 y.o. female presenting for 2-week history of persistent and worsening cough, left-sided chest pain that radiates to over the back.  She is also had some left flank pain.  However she feels as if the chest pain is under her ribs and on her lung on the left side.  She has not had COVID in the past 6 months.  She is a smoker, vapes and also uses marijuana daily.  Has a history of amphetamine abuse, benzodiazepine abuse.  She is specifically requesting hydrocodone cough syrup for her pain.  She did undergo an antibiotic course with azithromycin from her visit on 08/01/2021.  No current facility-administered medications for this encounter.  Current Outpatient Medications:    albuterol (VENTOLIN HFA) 108 (90 Base) MCG/ACT inhaler, Inhale 1-2 puffs into the lungs every 6 (six) hours as needed for wheezing or shortness of breath., Disp: 1 each, Rfl: 0   atomoxetine (STRATTERA) 40 MG capsule, Take 1 capsule (40 mg total) by mouth daily., Disp: 30 capsule, Rfl: 1   azithromycin (ZITHROMAX) 250 MG tablet, Take 1 tablet (250 mg total) by mouth daily. Take first 2 tablets together, then 1 every day until finished., Disp: 6 tablet, Rfl: 0   benzonatate (TESSALON PERLES) 100 MG capsule, Take 1 capsule (100 mg total) by mouth every 6 (six) hours as needed for cough., Disp: 21 capsule, Rfl: 0   benzonatate (TESSALON) 100 MG capsule, Take 1 capsule (100 mg total) by mouth 3 (three) times daily as needed., Disp: 30 capsule, Rfl: 0   brompheniramine-pseudoephedrine-DM 30-2-10 MG/5ML syrup, Take 5 mLs by mouth 4 (four) times daily as needed., Disp: 120 mL, Rfl: 0   fluticasone (FLONASE) 50 MCG/ACT nasal spray, Place 2 sprays into both nostrils daily., Disp: 16 g, Rfl: 0   HYDROcodone bit-homatropine (HYCODAN) 5-1.5 MG/5ML syrup, Take 5 mLs by mouth every 6 (six) hours as needed for cough., Disp: 100 mL, Rfl: 0    hydrOXYzine (ATARAX/VISTARIL) 50 MG tablet, Take 1 tablet (50 mg total) by mouth 3 (three) times daily as needed for anxiety., Disp: 30 tablet, Rfl: 1   ipratropium (ATROVENT) 0.03 % nasal spray, Place 2 sprays into both nostrils every 12 (twelve) hours., Disp: 30 mL, Rfl: 0   mirtazapine (REMERON) 15 MG tablet, Take 1 tablet (15 mg total) by mouth at bedtime., Disp: 30 tablet, Rfl: 1   naproxen (NAPROSYN) 500 MG tablet, Take 1 tablet (500 mg total) by mouth 2 (two) times daily with a meal., Disp: 30 tablet, Rfl: 0   penicillin v potassium (VEETID) 500 MG tablet, Take 1 tablet (500 mg total) by mouth 4 (four) times daily., Disp: 40 tablet, Rfl: 0   phenazopyridine (PYRIDIUM) 100 MG tablet, Take 1 tablet (100 mg total) by mouth 3 (three) times daily as needed for pain., Disp: 10 tablet, Rfl: 0   prazosin (MINIPRESS) 2 MG capsule, Take 1 capsule (2 mg total) by mouth at bedtime., Disp: 30 capsule, Rfl: 1   predniSONE (DELTASONE) 20 MG tablet, Take 2 tablets (40 mg total) by mouth daily with breakfast., Disp: 10 tablet, Rfl: 0   predniSONE (DELTASONE) 20 MG tablet, Take 2 tablets (40 mg total) by mouth daily with breakfast., Disp: 10 tablet, Rfl: 0   promethazine-dextromethorphan (PROMETHAZINE-DM) 6.25-15 MG/5ML syrup, Take 5 mLs by mouth 4 (four) times daily as needed for cough., Disp: 100 mL, Rfl: 0   Allergies  Allergen  Reactions   Atarax [Hydroxyzine] Other (See Comments)    "makes me crazy"   Trazodone And Nefazodone Other (See Comments)    Has weird dreams    Past Medical History:  Diagnosis Date   Abnormal Pap smear    ADHD (attention deficit hyperactivity disorder)    Anxiety    Chronic back pain    Depression    Migraine headache    UTI (lower urinary tract infection) 01/12/2013   Vaginal Pap smear, abnormal      Past Surgical History:  Procedure Laterality Date   CESAREAN SECTION N/A 09/08/2013   Procedure: CESAREAN SECTION;  Surgeon: Tereso Newcomer, MD;  Location: [facility];   Service: Obstetrics;  Laterality: N/A;   CESAREAN SECTION N/A 02/10/2018   Procedure: REPEAT CESAREAN SECTION;  Surgeon: Federico Flake, MD;  Location: [facility];  Service: Obstetrics;  Laterality: N/A;   TOOTH EXTRACTION     TUBAL LIGATION Bilateral 04/08/2018   Procedure: LAPAROSCOPIC BILATERAL TUBAL STERILIZATION WITH FALLOPE RING APPLICATION;  Surgeon: Tilda Burrow, MD;  Location: [facility];  Service: Gynecology;  Laterality: Bilateral;    Family History  Problem Relation Age of Onset   Drug abuse Mother     Social History   Tobacco Use   Smokeless tobacco: Never  Vaping Use   Vaping Use: Never used  Substance Use Topics   Alcohol use: Yes   Drug use: No    ROS   Objective:   Vitals: BP 116/77 (BP Location: Right Arm)    Pulse 81    Temp 98.2 F (36.8 C) (Oral)    Resp 16    LMP  (Within Months) Comment: 1 month. Tubal ligation.   SpO2 97%   Physical Exam Constitutional:      General: She is not in acute distress.    Appearance: Normal appearance. She is well-developed. She is not ill-appearing, toxic-appearing or diaphoretic.  HENT:     Head: Normocephalic and atraumatic.     Nose: Nose normal.     Mouth/Throat:     Mouth: Mucous membranes are moist.  Eyes:     General: No scleral icterus.       Right eye: No discharge.        Left eye: No discharge.     Extraocular Movements: Extraocular movements intact.  Cardiovascular:     Rate and Rhythm: Normal rate.     Heart sounds: No murmur heard.   No friction rub. No gallop.  Pulmonary:     Effort: Pulmonary effort is normal. No respiratory distress.     Breath sounds: No stridor. No wheezing, rhonchi or rales.  Chest:     Chest wall: Tenderness (over area outlined, worse laterally extending into the lateralmost thoracic side of her back) present.    Skin:    General: Skin is warm and dry.  Neurological:     General: No focal deficit present.     Mental Status: She is alert and oriented to  person, place, and time.  Psychiatric:        Mood and Affect: Mood normal.        Behavior: Behavior normal.    Results for orders placed or performed during the hospital encounter of 09/01/21 (from the past 24 hour(s))  POCT urinalysis dipstick     Status: Abnormal   Collection Time: 09/01/21 11:21 AM  Result Value Ref Range   Color, UA yellow yellow   Clarity, UA clear clear  Glucose, UA negative negative mg/dL   Bilirubin, UA negative negative   Ketones, POC UA negative negative mg/dL   Spec Grav, UA 0.9811.025 1.9141.010 - 1.025   Blood, UA negative negative   pH, UA 7.0 5.0 - 8.0   Protein Ur, POC =30 (A) negative mg/dL   Urobilinogen, UA 0.2 0.2 or 1.0 E.U./dL   Nitrite, UA Negative Negative   Leukocytes, UA Small (1+) (A) Negative  POCT urine pregnancy     Status: None   Collection Time: 09/01/21 11:47 AM  Result Value Ref Range   Preg Test, Ur Negative Negative   DG Chest 2 View  Result Date: 09/01/2021 CLINICAL DATA:  Cough. EXAM: CHEST - 2 VIEW COMPARISON:  06/04/2020 FINDINGS: The lungs are clear without focal pneumonia, edema, pneumothorax or pleural effusion. The cardiopericardial silhouette is within normal limits for size. The visualized bony structures of the thorax show no acute abnormality. IMPRESSION: No active cardiopulmonary disease. Electronically Signed   By: Kennith CenterEric  Mansell M.D.   On: 09/01/2021 12:31    Assessment and Plan :   I have reviewed the PDMP during this encounter.  1. Persistent cough   2. Atypical chest pain      I offered her benzonatate capsules, promethazine cough syrup but she refuses stating that it never helps and she has plenty of this.  She did get a prescription for hydrocodone cough syrup August 01, 2021 and is now out of this.  Would like a refill.  I refused as this is a narcotic medication and has a history of benzodiazepine and amphetamine abuse.  Patient left the clinic without me being able to complete her visit.  She refused to wait  any longer. Time in department was 01:19. Radiology over read was pending.  Chest x-ray was negative.  No need for further follow-up.   Wallis BambergMani, Dameian Crisman, PA-C 09/01/21 1240

## 2021-09-20 ENCOUNTER — Emergency Department (HOSPITAL_COMMUNITY)
Admission: EM | Admit: 2021-09-20 | Discharge: 2021-09-20 | Disposition: A | Discharge status: Home / Self Care | Payer: Medicaid Other | Source: Home / Self Care

## 2021-09-20 ENCOUNTER — Emergency Department (HOSPITAL_COMMUNITY)
Admission: EM | Admit: 2021-09-20 | Discharge: 2021-09-20 | Disposition: A | Discharge status: Home / Self Care | Payer: Medicaid Other | Attending: Emergency Medicine | Admitting: Emergency Medicine

## 2021-09-20 ENCOUNTER — Other Ambulatory Visit: Payer: Self-pay

## 2021-09-20 ENCOUNTER — Emergency Department (HOSPITAL_COMMUNITY): Payer: Medicaid Other

## 2021-09-20 DIAGNOSIS — Z23 Encounter for immunization: Secondary | ICD-10-CM | POA: Insufficient documentation

## 2021-09-20 DIAGNOSIS — S0993XA Unspecified injury of face, initial encounter: Secondary | ICD-10-CM | POA: Diagnosis present

## 2021-09-20 DIAGNOSIS — S0181XA Laceration without foreign body of other part of head, initial encounter: Secondary | ICD-10-CM | POA: Diagnosis not present

## 2021-09-20 MED ORDER — BACITRACIN ZINC 500 UNIT/GM EX OINT
TOPICAL_OINTMENT | Freq: Once | CUTANEOUS | Status: AC
  Filled 2021-09-20: qty 0.9

## 2021-09-20 MED ORDER — TETANUS-DIPHTH-ACELL PERTUSSIS 5-2.5-18.5 LF-MCG/0.5 IM SUSY
0.5000 mL | PREFILLED_SYRINGE | Freq: Once | INTRAMUSCULAR | Status: AC
  Administered 2021-09-20: 0.5 mL via INTRAMUSCULAR
  Filled 2021-09-20: qty 0.5

## 2021-09-20 MED ORDER — LIDOCAINE-EPINEPHRINE (PF) 2 %-1:200000 IJ SOLN
20.0000 mL | Freq: Once | INTRAMUSCULAR | Status: AC
  Administered 2021-09-20: 20 mL
  Filled 2021-09-20: qty 20
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
## 2021-09-20 NOTE — ED Notes (Signed)
Went over paperwork with patient.  Explained to use ointment on wound.  Patient was upset that provider did not prescribe any pain medications.  Explained to the patient that the provider felt that Tylenol and Motrin would help.  Patient left without taking ointment.

## 2021-09-20 NOTE — Discharge Instructions (Addendum)
You have been provided with a topical antibiotic to be applied to your facial wound.  You may apply bacitracin or OTC Neosporin to the wound 2-3 times per day for the next 7-10 days.  Do not submerge your wound in water until you can follow-up with your primary care provider and have it reevaluated.  The sutures that you have are nonrestorable sutures and will need to be removed in about 5 to 7 days.  This can be done by your primary care physician.  Call your primary care office to schedule an appointment for this time.  Return to the ED for new or worsening symptoms as discussed.

## 2021-10-03 ENCOUNTER — Other Ambulatory Visit: Payer: Self-pay

## 2021-10-03 ENCOUNTER — Emergency Department (HOSPITAL_COMMUNITY)
Admission: EM | Admit: 2021-10-03 | Discharge: 2021-10-03 | Disposition: A | Discharge status: Home / Self Care | Payer: Medicaid Other | Attending: Emergency Medicine | Admitting: Emergency Medicine

## 2021-10-03 ENCOUNTER — Encounter (HOSPITAL_COMMUNITY): Payer: Self-pay

## 2021-10-03 DIAGNOSIS — K0889 Other specified disorders of teeth and supporting structures: Secondary | ICD-10-CM | POA: Insufficient documentation

## 2021-10-03 DIAGNOSIS — Z79899 Other long term (current) drug therapy: Secondary | ICD-10-CM | POA: Diagnosis not present

## 2021-10-03 MED ORDER — KETOROLAC TROMETHAMINE 15 MG/ML IJ SOLN
15.0000 mg | Freq: Once | INTRAMUSCULAR | Status: AC
  Administered 2021-10-03: 15 mg via INTRAMUSCULAR
  Filled 2021-10-03: qty 1

## 2021-10-03 MED ORDER — IBUPROFEN 600 MG PO TABS: 600.0000 mg | ORAL_TABLET | Freq: Four times a day (QID) | ORAL | 0 refills | Status: DC | PRN

## 2021-10-03 MED ORDER — PENICILLIN V POTASSIUM 500 MG PO TABS
500.0000 mg | ORAL_TABLET | Freq: Four times a day (QID) | ORAL | 0 refills | Status: AC
Start: 2021-10-03 — End: 2021-10-10

## 2021-10-03 NOTE — ED Triage Notes (Signed)
Dental pain X2-3 days with pain from previous cavity that has not been replaced.

## 2021-10-03 NOTE — Discharge Instructions (Addendum)
It was a pleasure taking care of you today!   You will be prescribed Penicillin, take as directed and ensure to complete the entire course of the antibiotic.  You will be sent a prescription for ibuprofen, take as directed.  You may alternate with over-the-counter 500 mg Tylenol every 6 hours as needed. Attached you will find a resource guide for dentists in the area, call and set up a follow up appointment. Return to the Emergency Department if you are experiencing trouble breathing, trouble swallowing, decreased fluid intake, chest pain, or worsening symptoms.

## 2021-10-03 NOTE — ED Provider Notes (Signed)
[facility] EMERGENCY DEPARTMENT Provider Note   CSN: [id number] Arrival date & time: 10/03/21  0944     History  Chief Complaint  Patient presents with   Dental Pain    Jessica [name] is a 31 y.o. female who presents to the ED complaining of right frontal dental pain x2-3 days.  Does not have a dentist at this time.  Has tried alternating Tylenol and ibuprofen every for her symptoms.  Denies fever, chills, trouble swallowing, sore throat, drainage.  The history is provided by the patient. No language interpreter was used.      Home Medications Prior to Admission medications   Medication Sig Start Date End Date Taking? Authorizing Provider  ibuprofen (ADVIL) 600 MG tablet Take 1 tablet (600 mg total) by mouth every 6 (six) hours as needed. 10/03/21  Yes Rashell Shambaugh A, PA-C  albuterol (VENTOLIN HFA) 108 (90 Base) MCG/ACT inhaler Inhale 1-2 puffs into the lungs every 6 (six) hours as needed for wheezing or shortness of breath. 06/25/21   Eber Hong, MD  atomoxetine (STRATTERA) 40 MG capsule Take 1 capsule (40 mg total) by mouth daily. 03/10/21   Clapacs, Jackquline Denmark, MD  azithromycin (ZITHROMAX) 250 MG tablet Take 1 tablet (250 mg total) by mouth daily. Take first 2 tablets together, then 1 every day until finished. 08/01/21   Particia Nearing, PA-C  brompheniramine-pseudoephedrine-DM 30-2-10 MG/5ML syrup Take 5 mLs by mouth 4 (four) times daily as needed. 06/24/21   Margaretann Loveless, PA-C  fluticasone (FLONASE) 50 MCG/ACT nasal spray Place 2 sprays into both nostrils daily. 06/24/21   Margaretann Loveless, PA-C  HYDROcodone bit-homatropine (HYCODAN) 5-1.5 MG/5ML syrup Take 5 mLs by mouth every 6 (six) hours as needed for cough. 08/01/21   Particia Nearing, PA-C  hydrOXYzine (ATARAX/VISTARIL) 50 MG tablet Take 1 tablet (50 mg total) by mouth 3 (three) times daily as needed for anxiety. 03/09/21   Clapacs, Jackquline Denmark, MD  ipratropium (ATROVENT) 0.03 % nasal spray Place 2 sprays  into both nostrils every 12 (twelve) hours. 06/24/21   Margaretann Loveless, PA-C  mirtazapine (REMERON) 15 MG tablet Take 1 tablet (15 mg total) by mouth at bedtime. 03/09/21   Clapacs, Jackquline Denmark, MD  naproxen (NAPROSYN) 500 MG tablet Take 1 tablet (500 mg total) by mouth 2 (two) times daily with a meal. 05/29/21   Eber Hong, MD  penicillin v potassium (VEETID) 500 MG tablet Take 1 tablet (500 mg total) by mouth 4 (four) times daily for 7 days. 10/03/21 10/10/21  Moriah Loughry A, PA-C  phenazopyridine (PYRIDIUM) 100 MG tablet Take 1 tablet (100 mg total) by mouth 3 (three) times daily as needed for pain. 03/10/21   Avegno, Zachery Dakins, FNP  prazosin (MINIPRESS) 2 MG capsule Take 1 capsule (2 mg total) by mouth at bedtime. 03/09/21   Clapacs, Jackquline Denmark, MD  predniSONE (DELTASONE) 20 MG tablet Take 2 tablets (40 mg total) by mouth daily with breakfast. 05/17/21   Particia Nearing, PA-C  predniSONE (DELTASONE) 20 MG tablet Take 2 tablets (40 mg total) by mouth daily with breakfast. 08/01/21   Particia Nearing, PA-C  promethazine-dextromethorphan (PROMETHAZINE-DM) 6.25-15 MG/5ML syrup Take 5 mLs by mouth 4 (four) times daily as needed for cough. 05/17/21   Particia Nearing, PA-C      Allergies    Atarax [hydroxyzine] and Trazodone and nefazodone    Review of Systems   Review of Systems  Constitutional:  Negative for chills and  fever.  HENT:  Positive for dental problem and facial swelling. Negative for sore throat and trouble swallowing.   Gastrointestinal:  Negative for nausea and vomiting.  All other systems reviewed and are negative.  Physical Exam Updated Vital Signs BP 110/63    Pulse 80    Temp 98.3 F (36.8 C) (Oral)    Resp 18    Ht 5\' 3"  (1.6 m)    Wt 60 kg    LMP 09/15/2021 (Within Days)    SpO2 100%    BMI 23.43 kg/m  Physical Exam Vitals and nursing note reviewed.  Constitutional:      General: She is not in acute distress.    Appearance: She is not ill-appearing.   HENT:     Head: Normocephalic and atraumatic.     Right Ear: External ear normal.     Left Ear: External ear normal.     Nose: Nose normal.     Mouth/Throat:     Mouth: Mucous membranes are moist.     Dentition: Abnormal dentition. Dental tenderness and dental caries present.     Tongue: Tongue does not deviate from midline.     Pharynx: Oropharynx is clear. Uvula midline. No oropharyngeal exudate or posterior oropharyngeal erythema.     Tonsils: No tonsillar exudate or tonsillar abscesses.     Comments: Multiple dental caries noted throughout. Tenderness to palpation to right central gum line. No fluctuance noted. No trismus. No retropharyngeal abscess. No peritonsillar abscess noted. Uvula midline. Eyes:     Extraocular Movements: Extraocular movements intact.  Cardiovascular:     Rate and Rhythm: Normal rate and regular rhythm.     Pulses: Normal pulses.     Heart sounds: Normal heart sounds.  Pulmonary:     Effort: Pulmonary effort is normal. No respiratory distress.     Breath sounds: Normal breath sounds.  Abdominal:     General: Bowel sounds are normal. There is no distension.     Palpations: Abdomen is soft. There is no mass.     Tenderness: There is no abdominal tenderness.  Musculoskeletal:        General: Normal range of motion.     Cervical back: Neck supple.  Lymphadenopathy:     Head:     Right side of head: No submental, submandibular, tonsillar, preauricular or posterior auricular adenopathy.     Left side of head: No submental, submandibular, tonsillar, preauricular or posterior auricular adenopathy.     Cervical: No cervical adenopathy.  Skin:    General: Skin is warm and dry.     Findings: No rash.  Neurological:     Mental Status: She is alert.  Psychiatric:        Behavior: Behavior normal.    ED Results / Procedures / Treatments   Labs (all labs ordered are listed, but only abnormal results are displayed) Labs Reviewed - No data to  display  EKG None  Radiology No results found.  Procedures Procedures    Medications Ordered in ED Medications  ketorolac (TORADOL) 15 MG/ML injection 15 mg (15 mg Intramuscular Given 10/03/21 1029)    ED Course/ Medical Decision Making/ A&P                           Medical Decision Making Risk Prescription drug management.   Patient presents to the ED with right central dental pain onset 2-3 days.  Patient does not have a 10/05/21.  Denies  trouble swallowing, fever, vomiting.  Vital signs stable, patient afebrile, not tachycardic or hypoxic.  On exam patient with multiple dental caries noted throughout.  Tenderness to palpation noted to right central gumline.  No fluctuance noted.  No retropharyngeal abscess.  No peritonsillar abscess noted.  Uvula midline without swelling.  No cardiovascular or respiratory exam findings.  Differential diagnosis includes pharyngeal abscess, dental abscess, Ludwig's angina.   We will give Toradol injection in the ED prior to discharge.  Patient presentation suspicious for dentalgia.  No abscess requiring immediate incision and drainage.  Exam not concerning for Ludwig's angina or pharyngeal abscess.  Will treat with penicillin and ibuprofen prescription.  Provided resource guide for dentists in the area, patient structured to follow-up with a dentist.  Supportive care measures and strict return precautions discussed with patient at bedside.  Patient acknowledges and verbalized understanding.    Patient appears safe for discharge.  Follow-up as indicated discharge blood work.  Final Clinical Impression(s) / ED Diagnoses Final diagnoses:  Pain, dental    Rx / DC Orders ED Discharge Orders          Ordered    ibuprofen (ADVIL) 600 MG tablet  Every 6 hours PRN        10/03/21 1021    penicillin v potassium (VEETID) 500 MG tablet  4 times daily        10/03/21 1021              Honey Zakarian A, PA-C 10/03/21 1134    Bethann Berkshire,  MD 10/04/21 1631

## 2021-10-21 ENCOUNTER — Ambulatory Visit
Admission: EM | Admit: 2021-10-21 | Discharge: 2021-10-21 | Disposition: A | Discharge status: Home / Self Care | Payer: Medicaid Other | Attending: Urgent Care | Admitting: Urgent Care

## 2021-10-21 ENCOUNTER — Other Ambulatory Visit: Payer: Self-pay

## 2021-10-21 DIAGNOSIS — F17200 Nicotine dependence, unspecified, uncomplicated: Secondary | ICD-10-CM

## 2021-10-21 DIAGNOSIS — R053 Chronic cough: Secondary | ICD-10-CM

## 2021-10-21 MED ORDER — PROMETHAZINE-DM 6.25-15 MG/5ML PO SYRP: 5.0000 mL | ORAL_SOLUTION | Freq: Four times a day (QID) | ORAL | 0 refills | Status: DC | PRN

## 2021-10-21 MED ORDER — ALBUTEROL SULFATE HFA 108 (90 BASE) MCG/ACT IN AERS: 1.0000 | INHALATION_SPRAY | Freq: Four times a day (QID) | RESPIRATORY_TRACT | 0 refills | Status: DC | PRN

## 2021-10-21 MED ORDER — PREDNISONE 50 MG PO TABS: 50.0000 mg | ORAL_TABLET | Freq: Every day | ORAL | 0 refills | Status: DC

## 2021-10-21 NOTE — ED Triage Notes (Signed)
Pt reports cough x 3 weeks. Cough syrup gives no relief.  ? ?Pt reports headache x 2 days. Pt has not taken any OTC meds for headache ?

## 2021-10-21 NOTE — ED Provider Notes (Addendum)
?[facility] ? ? ?MRN: [id number] DOB: 1991/07/15 ? ?Subjective:  ? ?Jessica [name] is a 31 y.o. female presenting for 3 week history of persistent coughing. Cough is dry, hacking. No body aches, fever, chest pain, shob, wheezing. No throat pain, ear pain, facial pain, sinus pain. Patient is a smoker, she used to smoke 2ppd, has cut down significantly as she is trying to quit.  ? ?No current facility-administered medications for this encounter. ? ?Current Outpatient Medications:  ?  albuterol (VENTOLIN HFA) 108 (90 Base) MCG/ACT inhaler, Inhale 1-2 puffs into the lungs every 6 (six) hours as needed for wheezing or shortness of breath., Disp: 1 each, Rfl: 0 ?  atomoxetine (STRATTERA) 40 MG capsule, Take 1 capsule (40 mg total) by mouth daily., Disp: 30 capsule, Rfl: 1 ?  azithromycin (ZITHROMAX) 250 MG tablet, Take 1 tablet (250 mg total) by mouth daily. Take first 2 tablets together, then 1 every day until finished., Disp: 6 tablet, Rfl: 0 ?  brompheniramine-pseudoephedrine-DM 30-2-10 MG/5ML syrup, Take 5 mLs by mouth 4 (four) times daily as needed., Disp: 120 mL, Rfl: 0 ?  fluticasone (FLONASE) 50 MCG/ACT nasal spray, Place 2 sprays into both nostrils daily., Disp: 16 g, Rfl: 0 ?  HYDROcodone bit-homatropine (HYCODAN) 5-1.5 MG/5ML syrup, Take 5 mLs by mouth every 6 (six) hours as needed for cough., Disp: 100 mL, Rfl: 0 ?  hydrOXYzine (ATARAX/VISTARIL) 50 MG tablet, Take 1 tablet (50 mg total) by mouth 3 (three) times daily as needed for anxiety., Disp: 30 tablet, Rfl: 1 ?  ibuprofen (ADVIL) 600 MG tablet, Take 1 tablet (600 mg total) by mouth every 6 (six) hours as needed., Disp: 30 tablet, Rfl: 0 ?  ipratropium (ATROVENT) 0.03 % nasal spray, Place 2 sprays into both nostrils every 12 (twelve) hours., Disp: 30 mL, Rfl: 0 ?  mirtazapine (REMERON) 15 MG tablet, Take 1 tablet (15 mg total) by mouth at bedtime., Disp: 30 tablet, Rfl: 1 ?  naproxen (NAPROSYN) 500 MG tablet, Take 1 tablet (500 mg total)  by mouth 2 (two) times daily with a meal., Disp: 30 tablet, Rfl: 0 ?  phenazopyridine (PYRIDIUM) 100 MG tablet, Take 1 tablet (100 mg total) by mouth 3 (three) times daily as needed for pain., Disp: 10 tablet, Rfl: 0 ?  prazosin (MINIPRESS) 2 MG capsule, Take 1 capsule (2 mg total) by mouth at bedtime., Disp: 30 capsule, Rfl: 1 ?  predniSONE (DELTASONE) 20 MG tablet, Take 2 tablets (40 mg total) by mouth daily with breakfast., Disp: 10 tablet, Rfl: 0 ?  predniSONE (DELTASONE) 20 MG tablet, Take 2 tablets (40 mg total) by mouth daily with breakfast., Disp: 10 tablet, Rfl: 0 ?  promethazine-dextromethorphan (PROMETHAZINE-DM) 6.25-15 MG/5ML syrup, Take 5 mLs by mouth 4 (four) times daily as needed for cough., Disp: 100 mL, Rfl: 0  ? ?Allergies  ?Allergen Reactions  ? Atarax [Hydroxyzine] Other (See Comments)  ?  "makes me crazy"  ? Trazodone And Nefazodone Other (See Comments)  ?  Has weird dreams  ? ? ?Past Medical History:  ?Diagnosis Date  ? Abnormal Pap smear   ? ADHD (attention deficit hyperactivity disorder)   ? Anxiety   ? Chronic back pain   ? Depression   ? Migraine headache   ? UTI (lower urinary tract infection) 01/12/2013  ? Vaginal Pap smear, abnormal   ?  ? ?Past Surgical History:  ?Procedure Laterality Date  ? CESAREAN SECTION N/A 09/08/2013  ? Procedure: CESAREAN SECTION;  Surgeon: Vela Prose  A Anyanwu, MD;  Location: [facility];  Service: Obstetrics;  Laterality: N/A;  ? CESAREAN SECTION N/A 02/10/2018  ? Procedure: REPEAT CESAREAN SECTION;  Surgeon: Federico Flake, MD;  Location: [facility];  Service: Obstetrics;  Laterality: N/A;  ? TOOTH EXTRACTION    ? TUBAL LIGATION Bilateral 04/08/2018  ? Procedure: LAPAROSCOPIC BILATERAL TUBAL STERILIZATION WITH FALLOPE RING APPLICATION;  Surgeon: Tilda Burrow, MD;  Location: [facility];  Service: Gynecology;  Laterality: Bilateral;  ? ? ?Family History  ?Problem Relation Age of Onset  ? Drug abuse Mother   ? ? ?Social History  ? ?Tobacco Use  ? Smoking  status: Every Day  ?  Packs/day: 1.00  ?  Types: Cigarettes  ? Smokeless tobacco: Never  ?Vaping Use  ? Vaping Use: Never used  ?Substance Use Topics  ? Alcohol use: Yes  ? Drug use: No  ? ? ?ROS ? ? ?Objective:  ? ?Vitals: ?BP 129/84 (BP Location: Right Arm)   Pulse 79   Temp 98.1 ?F (36.7 ?C) (Oral)   Resp 16   LMP  (Within Months) Comment: 1 month  SpO2 99%  ? ?Physical Exam ?Constitutional:   ?   General: She is not in acute distress. ?   Appearance: Normal appearance. She is well-developed. She is not ill-appearing, toxic-appearing or diaphoretic.  ?HENT:  ?   Head: Normocephalic and atraumatic.  ?   Nose: Nose normal.  ?   Mouth/Throat:  ?   Mouth: Mucous membranes are moist.  ?Eyes:  ?   General: No scleral icterus.    ?   Right eye: No discharge.     ?   Left eye: No discharge.  ?   Extraocular Movements: Extraocular movements intact.  ?Cardiovascular:  ?   Rate and Rhythm: Normal rate.  ?   Heart sounds: No murmur heard. ?  No friction rub. No gallop.  ?Pulmonary:  ?   Effort: Pulmonary effort is normal. No respiratory distress.  ?   Breath sounds: No stridor. No wheezing, rhonchi or rales.  ?Chest:  ?   Chest wall: No tenderness.  ?Skin: ?   General: Skin is warm and dry.  ?Neurological:  ?   General: No focal deficit present.  ?   Mental Status: She is alert and oriented to person, place, and time.  ?Psychiatric:     ?   Mood and Affect: Mood normal.     ?   Behavior: Behavior normal.  ? ?Assessment and Plan :  ? ?PDMP not reviewed this encounter. ? ?1. Persistent cough for 3 weeks or longer   ?2. Smoker   ? ?Patient's primary concerns of infection just managing her cough.  Offered an oral prednisone course.  Recommend supportive care otherwise.  Patient requested a narcotic cough syrup that I declined given her history of cannabis abuse, benzodiazepine abuse, amphetamine abuse. Counseled patient on potential for adverse effects with medications prescribed/recommended today, ER and return-to-clinic  precautions discussed, patient verbalized understanding.  Deferred imaging given clear cardiopulmonary exam, hemodynamically stable vital signs.  ? ? ?  ?Wallis Bamberg, PA-C ?10/21/21 1153 ? ?

## 2021-10-31 ENCOUNTER — Ambulatory Visit
Admission: EM | Admit: 2021-10-31 | Discharge: 2021-10-31 | Disposition: A | Discharge status: Home / Self Care | Payer: Medicaid Other | Attending: Urgent Care | Admitting: Urgent Care

## 2021-10-31 ENCOUNTER — Other Ambulatory Visit: Payer: Self-pay

## 2021-10-31 DIAGNOSIS — R0981 Nasal congestion: Secondary | ICD-10-CM

## 2021-10-31 DIAGNOSIS — J0191 Acute recurrent sinusitis, unspecified: Secondary | ICD-10-CM | POA: Diagnosis not present

## 2021-10-31 DIAGNOSIS — R052 Subacute cough: Secondary | ICD-10-CM

## 2021-10-31 DIAGNOSIS — J309 Allergic rhinitis, unspecified: Secondary | ICD-10-CM | POA: Diagnosis not present

## 2021-10-31 LAB — POCT RAPID STREP A (OFFICE): Rapid Strep A Screen: NEGATIVE

## 2021-10-31 MED ORDER — AMOXICILLIN-POT CLAVULANATE 875-125 MG PO TABS: 1.0000 | ORAL_TABLET | Freq: Two times a day (BID) | ORAL | 0 refills | Status: DC

## 2021-10-31 MED ORDER — PSEUDOEPHEDRINE HCL 30 MG PO TABS
30.0000 mg | ORAL_TABLET | Freq: Three times a day (TID) | ORAL | 0 refills | Status: DC | PRN
Start: 2021-10-31 — End: 2022-09-23

## 2021-10-31 MED ORDER — LEVOCETIRIZINE DIHYDROCHLORIDE 5 MG PO TABS
5.0000 mg | ORAL_TABLET | Freq: Every evening | ORAL | 0 refills | Status: DC
Start: 2021-10-31 — End: 2021-12-05

## 2021-10-31 MED ORDER — PROMETHAZINE-DM 6.25-15 MG/5ML PO SYRP: 5.0000 mL | ORAL_SOLUTION | Freq: Every evening | ORAL | 0 refills | Status: DC | PRN

## 2021-10-31 NOTE — ED Triage Notes (Signed)
Pt reports sore throat since this morning; cough and nasal congestion x 1 week.  ?

## 2021-10-31 NOTE — ED Provider Notes (Signed)
?[facility] ? ? ?MRN: [id number] DOB: 1991/03/14 ? ?Subjective:  ? ?Jessica [name] is a 31 y.o. female presenting for 1 week history of persistent malaise, coughing, sinus congestion sinus pain, throat pain that started this morning.  She has had 1 sick contact with her little daughter who is also being seen today.  Patient was last seen October 24, 2021, underwent a course of prednisone.  Prior to that she was seen in January 2023, had a negative chest x-ray and left without finishing her visit.  Patient felt that the prednisone was too strong.  She did not finish it.  No chest pain, shortness of breath or wheezing.  She is taking her allergy medication. ? ?No current facility-administered medications for this encounter. ? ?Current Outpatient Medications:  ?  albuterol (VENTOLIN HFA) 108 (90 Base) MCG/ACT inhaler, Inhale 1-2 puffs into the lungs every 6 (six) hours as needed for wheezing or shortness of breath., Disp: 1 each, Rfl: 0 ?  atomoxetine (STRATTERA) 40 MG capsule, Take 1 capsule (40 mg total) by mouth daily., Disp: 30 capsule, Rfl: 1 ?  azithromycin (ZITHROMAX) 250 MG tablet, Take 1 tablet (250 mg total) by mouth daily. Take first 2 tablets together, then 1 every day until finished., Disp: 6 tablet, Rfl: 0 ?  brompheniramine-pseudoephedrine-DM 30-2-10 MG/5ML syrup, Take 5 mLs by mouth 4 (four) times daily as needed., Disp: 120 mL, Rfl: 0 ?  fluticasone (FLONASE) 50 MCG/ACT nasal spray, Place 2 sprays into both nostrils daily., Disp: 16 g, Rfl: 0 ?  HYDROcodone bit-homatropine (HYCODAN) 5-1.5 MG/5ML syrup, Take 5 mLs by mouth every 6 (six) hours as needed for cough., Disp: 100 mL, Rfl: 0 ?  hydrOXYzine (ATARAX/VISTARIL) 50 MG tablet, Take 1 tablet (50 mg total) by mouth 3 (three) times daily as needed for anxiety., Disp: 30 tablet, Rfl: 1 ?  ibuprofen (ADVIL) 600 MG tablet, Take 1 tablet (600 mg total) by mouth every 6 (six) hours as needed., Disp: 30 tablet, Rfl: 0 ?  ipratropium  (ATROVENT) 0.03 % nasal spray, Place 2 sprays into both nostrils every 12 (twelve) hours., Disp: 30 mL, Rfl: 0 ?  mirtazapine (REMERON) 15 MG tablet, Take 1 tablet (15 mg total) by mouth at bedtime., Disp: 30 tablet, Rfl: 1 ?  naproxen (NAPROSYN) 500 MG tablet, Take 1 tablet (500 mg total) by mouth 2 (two) times daily with a meal., Disp: 30 tablet, Rfl: 0 ?  phenazopyridine (PYRIDIUM) 100 MG tablet, Take 1 tablet (100 mg total) by mouth 3 (three) times daily as needed for pain., Disp: 10 tablet, Rfl: 0 ?  prazosin (MINIPRESS) 2 MG capsule, Take 1 capsule (2 mg total) by mouth at bedtime., Disp: 30 capsule, Rfl: 1 ?  predniSONE (DELTASONE) 50 MG tablet, Take 1 tablet (50 mg total) by mouth daily with breakfast., Disp: 5 tablet, Rfl: 0 ?  promethazine-dextromethorphan (PROMETHAZINE-DM) 6.25-15 MG/5ML syrup, Take 5 mLs by mouth 4 (four) times daily as needed for cough., Disp: 200 mL, Rfl: 0  ? ?Allergies  ?Allergen Reactions  ? Atarax [Hydroxyzine] Other (See Comments)  ?  "makes me crazy"  ? Trazodone And Nefazodone Other (See Comments)  ?  Has weird dreams  ? ? ?Past Medical History:  ?Diagnosis Date  ? Abnormal Pap smear   ? ADHD (attention deficit hyperactivity disorder)   ? Anxiety   ? Chronic back pain   ? Depression   ? Migraine headache   ? UTI (lower urinary tract infection) 01/12/2013  ? Vaginal  Pap smear, abnormal   ?  ? ?Past Surgical History:  ?Procedure Laterality Date  ? CESAREAN SECTION N/A 09/08/2013  ? Procedure: CESAREAN SECTION;  Surgeon: Tereso NewcomerUgonna A Anyanwu, MD;  Location: [facility];  Service: Obstetrics;  Laterality: N/A;  ? CESAREAN SECTION N/A 02/10/2018  ? Procedure: REPEAT CESAREAN SECTION;  Surgeon: Federico FlakeNewton, Kimberly Niles, MD;  [facility];  Service: Obstetrics;  Laterality: N/A;  ? TOOTH EXTRACTION    ? TUBAL LIGATION Bilateral 04/08/2018  ? Procedure: LAPAROSCOPIC BILATERAL TUBAL STERILIZATION WITH FALLOPE RING APPLICATION;  Surgeon: Tilda BurrowFerguson, John V, MD;  Location: [facility];  Service:  Gynecology;  Laterality: Bilateral;  ? ? ?Family History  ?Problem Relation Age of Onset  ? Drug abuse Mother   ? ? ?Social History  ? ?Tobacco Use  ? Smoking status: Every Day  ?  Packs/day: 1.00  ?  Types: Cigarettes  ? Smokeless tobacco: Never  ?Vaping Use  ? Vaping Use: Never used  ?Substance Use Topics  ? Alcohol use: Yes  ? Drug use: No  ? ? ?ROS ? ? ?Objective:  ? ?Vitals: ?BP 102/63 (BP Location: Right Arm)   Pulse 72   Temp 98.6 ?F (37 ?C) (Oral)   Resp 18   LMP  (Within Weeks) Comment: 3 weeks  SpO2 97%  ? ?Physical Exam ?Constitutional:   ?   General: She is not in acute distress. ?   Appearance: Normal appearance. She is well-developed and normal weight. She is not ill-appearing, toxic-appearing or diaphoretic.  ?HENT:  ?   Head: Normocephalic and atraumatic.  ?   Right Ear: Tympanic membrane, ear canal and external ear normal. No drainage or tenderness. No middle ear effusion. There is no impacted cerumen. Tympanic membrane is not erythematous.  ?   Left Ear: Tympanic membrane, ear canal and external ear normal. No drainage or tenderness.  No middle ear effusion. There is no impacted cerumen. Tympanic membrane is not erythematous.  ?   Nose: Congestion present. No rhinorrhea.  ?   Mouth/Throat:  ?   Mouth: Mucous membranes are moist. No oral lesions.  ?   Pharynx: No pharyngeal swelling, oropharyngeal exudate, posterior oropharyngeal erythema or uvula swelling.  ?   Tonsils: No tonsillar exudate or tonsillar abscesses.  ?Eyes:  ?   General: No scleral icterus.    ?   Right eye: No discharge.     ?   Left eye: No discharge.  ?   Extraocular Movements: Extraocular movements intact.  ?   Right eye: Normal extraocular motion.  ?   Left eye: Normal extraocular motion.  ?   Conjunctiva/sclera: Conjunctivae normal.  ?Cardiovascular:  ?   Rate and Rhythm: Normal rate.  ?   Heart sounds: No murmur heard. ?  No friction rub. No gallop.  ?Pulmonary:  ?   Effort: Pulmonary effort is normal. No respiratory  distress.  ?   Breath sounds: No stridor. No wheezing, rhonchi or rales.  ?Chest:  ?   Chest wall: No tenderness.  ?Musculoskeletal:  ?   Cervical back: Normal range of motion and neck supple.  ?Lymphadenopathy:  ?   Cervical: No cervical adenopathy.  ?Skin: ?   General: Skin is warm and dry.  ?Neurological:  ?   General: No focal deficit present.  ?   Mental Status: She is alert and oriented to person, place, and time.  ?Psychiatric:     ?   Mood and Affect: Mood normal.     ?  Behavior: Behavior normal.  ? ?Results for orders placed or performed during the hospital encounter of 10/31/21 (from the past 24 hour(s))  ?POCT rapid strep A     Status: None  ? Collection Time: 10/31/21  1:48 PM  ?Result Value Ref Range  ? Rapid Strep A Screen Negative Negative  ? ? ?Assessment and Plan :  ? ?PDMP not reviewed this encounter. ? ?1. Acute recurrent sinusitis, unspecified location   ?2. Sinus congestion   ?3. Subacute cough   ?4. Allergic rhinitis, unspecified seasonality, unspecified trigger   ? ?Deferred imaging given clear cardiopulmonary exam, hemodynamically stable vital signs.  Given timeline of illness deferred respiratory testing. Will start empiric treatment for sinusitis with Augmentin.  Recommended supportive care otherwise including the use of oral antihistamine, decongestant. Counseled patient on potential for adverse effects with medications prescribed/recommended today, ER and return-to-clinic precautions discussed, patient verbalized understanding. ? ?  ?Wallis Bamberg, PA-C ?10/31/21 1401 ? ?

## 2021-11-07 ENCOUNTER — Encounter: Payer: Self-pay | Admitting: Emergency Medicine

## 2021-11-07 ENCOUNTER — Ambulatory Visit
Admission: EM | Admit: 2021-11-07 | Discharge: 2021-11-07 | Disposition: A | Discharge status: Home / Self Care | Payer: Medicaid Other | Attending: Family Medicine | Admitting: Family Medicine

## 2021-11-07 DIAGNOSIS — J3089 Other allergic rhinitis: Secondary | ICD-10-CM

## 2021-11-07 DIAGNOSIS — J209 Acute bronchitis, unspecified: Secondary | ICD-10-CM | POA: Diagnosis not present

## 2021-11-07 MED ORDER — PROMETHAZINE-DM 6.25-15 MG/5ML PO SYRP
5.0000 mL | ORAL_SOLUTION | Freq: Four times a day (QID) | ORAL | 0 refills | Status: DC | PRN
Start: 2021-11-07 — End: 2022-09-23

## 2021-11-07 MED ORDER — PREDNISONE 20 MG PO TABS: 40.0000 mg | ORAL_TABLET | Freq: Every day | ORAL | 0 refills | Status: DC

## 2021-11-07 NOTE — ED Triage Notes (Signed)
Pt seen for similar last week and reports continued sore throat, cough, runny nose and no improvement with previous visit recommendations.  ?
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
## 2021-11-20 ENCOUNTER — Ambulatory Visit
Admission: EM | Admit: 2021-11-20 | Discharge: 2021-11-20 | Disposition: A | Discharge status: Home / Self Care | Payer: Medicaid Other | Attending: Family Medicine | Admitting: Family Medicine

## 2021-11-20 DIAGNOSIS — F17200 Nicotine dependence, unspecified, uncomplicated: Secondary | ICD-10-CM | POA: Diagnosis not present

## 2021-11-20 DIAGNOSIS — R053 Chronic cough: Secondary | ICD-10-CM

## 2021-11-20 DIAGNOSIS — J3089 Other allergic rhinitis: Secondary | ICD-10-CM | POA: Diagnosis not present

## 2021-11-20 MED ORDER — PSEUDOEPH-BROMPHEN-DM 30-2-10 MG/5ML PO SYRP: 5.0000 mL | ORAL_SOLUTION | Freq: Four times a day (QID) | ORAL | 0 refills | Status: DC | PRN

## 2021-11-20 MED ORDER — BUDESONIDE-FORMOTEROL FUMARATE 160-4.5 MCG/ACT IN AERO: 2.0000 | INHALATION_SPRAY | Freq: Two times a day (BID) | RESPIRATORY_TRACT | 1 refills | Status: DC

## 2021-11-20 NOTE — ED Triage Notes (Signed)
Pt states she has had a cough and congestion for 3 to 4 weeks ? ?Pt states she tried the prescribed cough meds and OTC cough meds ? ?Denies Fever ?
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
## 2021-11-22 ENCOUNTER — Other Ambulatory Visit: Payer: Self-pay

## 2021-11-22 ENCOUNTER — Emergency Department (HOSPITAL_COMMUNITY)
Admission: EM | Admit: 2021-11-22 | Discharge: 2021-11-22 | Disposition: A | Discharge status: Home / Self Care | Payer: Medicaid Other | Attending: Emergency Medicine | Admitting: Emergency Medicine

## 2021-11-22 ENCOUNTER — Encounter (HOSPITAL_COMMUNITY): Payer: Self-pay

## 2021-11-22 DIAGNOSIS — F419 Anxiety disorder, unspecified: Secondary | ICD-10-CM

## 2021-11-22 DIAGNOSIS — F17200 Nicotine dependence, unspecified, uncomplicated: Secondary | ICD-10-CM | POA: Insufficient documentation

## 2021-11-22 DIAGNOSIS — F410 Panic disorder [episodic paroxysmal anxiety] without agoraphobia: Secondary | ICD-10-CM | POA: Diagnosis present

## 2021-11-22 MED ORDER — HYDROXYZINE HCL 25 MG PO TABS
25.0000 mg | ORAL_TABLET | Freq: Four times a day (QID) | ORAL | 0 refills | Status: DC
Start: 2021-11-22 — End: 2022-09-30

## 2021-11-22 MED ORDER — LORAZEPAM 1 MG PO TABS
1.0000 mg | ORAL_TABLET | Freq: Once | ORAL | Status: AC
  Administered 2021-11-22: 1 mg via ORAL
  Filled 2021-11-22: qty 1

## 2021-11-22 NOTE — ED Notes (Addendum)
Patient stated that she has an appointment Monday to be seen for her anxiety. Patient states she has no intent to harm herself. Patient states that the ativan is helping her anxiety and is requesting that she be discharged with PRN anxiety medication. PA made aware.  ?

## 2021-11-22 NOTE — ED Provider Notes (Signed)
?[facility] ?Provider Note ? ? ?CSN: [id number] ?Arrival date & time: 11/22/21  1152 ? ?  ? ?History ?Chief Complaint  ?Patient presents with  ? Panic Attack  ? ? ?Jessica [name] is a 31 y.o. female with longstanding history of anxiety not on any medications who presents to the emergency department with heightened level of anxiousness.  This has been ongoing and worsening over the last several weeks.  Patient does not take any medications for this.  She denies any depressive symptoms including suicidal thoughts or homicidal thoughts.  No auditory or visual hallucinations.  Patient does endorse chest tightness however she says this is typical with her panic attacks.  This is not different from any of her other panic attacks. ? ?HPI ? ?  ? ?Home Medications ?Prior to Admission medications   ?Medication Sig Start Date End Date Taking? Authorizing Provider  ?hydrOXYzine (ATARAX) 25 MG tablet Take 1 tablet (25 mg total) by mouth every 6 (six) hours. 11/22/21  Yes Meredeth Ide, Wilmarie Sparlin M, PA-C  ?albuterol (VENTOLIN HFA) 108 (90 Base) MCG/ACT inhaler Inhale 1-2 puffs into the lungs every 6 (six) hours as needed for wheezing or shortness of breath. 10/21/21   Wallis Bamberg, PA-C  ?amoxicillin-clavulanate (AUGMENTIN) 875-125 MG tablet Take 1 tablet by mouth 2 (two) times daily. ?Patient not taking: Reported on 11/22/2021 10/31/21   Wallis Bamberg, PA-C  ?atomoxetine (STRATTERA) 40 MG capsule Take 1 capsule (40 mg total) by mouth daily. ?Patient not taking: Reported on 11/22/2021 03/10/21   Clapacs, Jackquline Denmark, MD  ?azithromycin (ZITHROMAX) 250 MG tablet Take 1 tablet (250 mg total) by mouth daily. Take first 2 tablets together, then 1 every day until finished. ?Patient not taking: Reported on 11/22/2021 08/01/21   Particia Nearing, PA-C  ?brompheniramine-pseudoephedrine-DM 30-2-10 MG/5ML syrup Take 5 mLs by mouth 4 (four) times daily as needed. ?Patient not taking: Reported on 11/22/2021 06/24/21   Margaretann Loveless,  PA-C  ?brompheniramine-pseudoephedrine-DM 30-2-10 MG/5ML syrup Take 5 mLs by mouth 4 (four) times daily as needed. ?Patient not taking: Reported on 11/22/2021 11/20/21   Particia Nearing, PA-C  ?budesonide-formoterol [facility]) 160-4.5 MCG/ACT inhaler Inhale 2 puffs into the lungs 2 (two) times daily. Rinse mouth with water after each use ?Patient not taking: Reported on 11/22/2021 11/20/21   Particia Nearing, PA-C  ?fluticasone [facility]) 50 MCG/ACT nasal spray Place 2 sprays into both nostrils daily. ?Patient not taking: Reported on 11/22/2021 06/24/21   Margaretann Loveless, PA-C  ?HYDROcodone bit-homatropine (HYCODAN) 5-1.5 MG/5ML syrup Take 5 mLs by mouth every 6 (six) hours as needed for cough. ?Patient not taking: Reported on 11/22/2021 08/01/21   Particia Nearing, PA-C  ?hydrOXYzine (ATARAX/VISTARIL) 50 MG tablet Take 1 tablet (50 mg total) by mouth 3 (three) times daily as needed for anxiety. ?Patient not taking: Reported on 11/22/2021 03/09/21   Clapacs, Jackquline Denmark, MD  ?ibuprofen (ADVIL) 600 MG tablet Take 1 tablet (600 mg total) by mouth every 6 (six) hours as needed. ?Patient not taking: Reported on 11/22/2021 10/03/21   Blue, Soijett A, PA-C  ?ipratropium (ATROVENT) 0.03 % nasal spray Place 2 sprays into both nostrils every 12 (twelve) hours. ?Patient not taking: Reported on 11/22/2021 06/24/21   Margaretann Loveless, PA-C  ?levocetirizine (XYZAL) 5 MG tablet Take 1 tablet (5 mg total) by mouth every evening. ?Patient not taking: Reported on 11/22/2021 10/31/21   Wallis Bamberg, PA-C  ?mirtazapine (REMERON) 15 MG tablet Take 1 tablet (15 mg total) by mouth  at bedtime. ?Patient not taking: Reported on 11/22/2021 03/09/21   Clapacs, Jackquline Denmark, MD  ?naproxen (NAPROSYN) 500 MG tablet Take 1 tablet (500 mg total) by mouth 2 (two) times daily with a meal. ?Patient not taking: Reported on 11/22/2021 05/29/21   Eber Hong, MD  ?phenazopyridine (PYRIDIUM) 100 MG tablet Take 1 tablet (100 mg total) by mouth 3 (three)  times daily as needed for pain. ?Patient not taking: Reported on 11/22/2021 03/10/21   Durward Parcel, FNP  ?prazosin (MINIPRESS) 2 MG capsule Take 1 capsule (2 mg total) by mouth at bedtime. ?Patient not taking: Reported on 11/22/2021 03/09/21   Clapacs, Jackquline Denmark, MD  ?predniSONE (DELTASONE) 20 MG tablet Take 2 tablets (40 mg total) by mouth daily with breakfast. ?Patient not taking: Reported on 11/22/2021 11/07/21   Particia Nearing, PA-C  ?predniSONE (DELTASONE) 50 MG tablet Take 1 tablet (50 mg total) by mouth daily with breakfast. ?Patient not taking: Reported on 11/22/2021 10/21/21   Wallis Bamberg, PA-C  ?promethazine-dextromethorphan (PROMETHAZINE-DM) 6.25-15 MG/5ML syrup Take 5 mLs by mouth 4 (four) times daily as needed. ?Patient not taking: Reported on 11/22/2021 11/07/21   Particia Nearing, PA-C  ?pseudoephedrine (SUDAFED) 30 MG tablet Take 1 tablet (30 mg total) by mouth every 8 (eight) hours as needed for congestion. ?Patient not taking: Reported on 11/22/2021 10/31/21   Wallis Bamberg, PA-C  ?   ? ?Allergies    ?Atarax [hydroxyzine] and Trazodone and nefazodone   ? ?Review of Systems   ?Review of Systems  ?All other systems reviewed and are negative. ? ?Physical Exam ?Updated Vital Signs ?BP 119/82 (BP Location: Right Arm)   Pulse 72   Temp 97.7 ?F (36.5 ?C) (Oral)   Resp 16   Ht 5\' 2"  (1.575 m)   Wt 59 kg   LMP 11/06/2021   SpO2 100%   BMI 23.78 kg/m?  ?Physical Exam ?Vitals and nursing note reviewed.  ?Constitutional:   ?   General: She is not in acute distress. ?   Appearance: Normal appearance.  ?HENT:  ?   Head: Normocephalic and atraumatic.  ?Eyes:  ?   General:     ?   Right eye: No discharge.     ?   Left eye: No discharge.  ?Cardiovascular:  ?   Comments: Regular rate and rhythm.  S1/S2 are distinct without any evidence of murmur, rubs, or gallops.  Radial pulses are 2+ bilaterally.  Dorsalis pedis pulses are 2+ bilaterally.  No evidence of pedal edema. ?Pulmonary:  ?   Comments: Clear to  auscultation bilaterally.  Normal effort.  No respiratory distress.  No evidence of wheezes, rales, or rhonchi heard throughout. ?Abdominal:  ?   General: Abdomen is flat. Bowel sounds are normal. There is no distension.  ?   Tenderness: There is no abdominal tenderness. There is no guarding or rebound.  ?Musculoskeletal:     ?   General: Normal range of motion.  ?   Cervical back: Neck supple.  ?Skin: ?   General: Skin is warm and dry.  ?   Findings: No rash.  ?Neurological:  ?   General: No focal deficit present.  ?   Mental Status: She is alert.  ?Psychiatric:     ?   Mood and Affect: Mood is anxious. Affect is tearful.     ?   Behavior: Behavior normal.  ? ? ?ED Results / Procedures / Treatments   ?Labs ?(all labs ordered are listed, but only  abnormal results are displayed) ?Labs Reviewed - No data to display ? ?EKG ?None ? ?Radiology ?No results found. ? ?Procedures ?Procedures  ? ? ?Medications Ordered in ED ?Medications  ?LORazepam (ATIVAN) tablet 1 mg (1 mg Oral Given 11/22/21 1421)  ? ? ?ED Course/ Medical Decision Making/ A&P ?Clinical Course as of 11/22/21 1501  ?Fri Nov 22, 2021  ?1440 Patient wishing to speak with a psychiatrist. [CF]  ?1443 Patient now does not want to see a psychiatrist and states she feels much better with 1 mg of Ativan.  Patient has a follow-up appointment on Monday.  Patient requests anxiety medication to go home with. [CF]  ?  ?Clinical Course User Index ?[CF] Honor LohFleming, Leshia Kope M, PA-C  ? ?                        ?Medical Decision Making ?Risk ?Prescription drug management. ? ? ?This patient presents to the ED for concern of anxiety, this involves an extensive number of treatment options, and is a complaint that carries with it a high risk of complications and morbidity.  The differential diagnosis includes panic attack ? ? ?Co morbidities that complicate the patient evaluation ? ?Past Medical History:  ?Diagnosis Date  ? Abnormal Pap smear   ? ADHD (attention deficit hyperactivity  disorder)   ? Anxiety   ? Chronic back pain   ? Depression   ? Migraine headache   ? UTI (lower urinary tract infection) 01/12/2013  ? Vaginal Pap smear, abnormal   ? ? ?Additional history obtained: ? ?Additi

## 2021-11-22 NOTE — ED Notes (Signed)
Patient is very vague about stressful situations in life, gets tearful when you ask her questions.  Continues to state "I have ate in 6 days due to stress".  Denies SI HI A/V hallucinations ?

## 2021-11-22 NOTE — Discharge Instructions (Signed)
Please follow-up on Monday with your scheduled appointment.  Return to the emergency department any worsening symptoms. ?

## 2021-11-22 NOTE — ED Triage Notes (Signed)
Patient complains of sob and anxiety, panick attack.  Reports her chest is tight and she is under a lot of stress and hasnt ate in 6 days due to her stress level and when she eats she vomits.  Patient is shaking in triage and crying.   ?

## 2021-12-05 ENCOUNTER — Ambulatory Visit
Admission: EM | Admit: 2021-12-05 | Discharge: 2021-12-05 | Disposition: A | Discharge status: Home / Self Care | Payer: Medicaid Other | Attending: Emergency Medicine | Admitting: Emergency Medicine

## 2021-12-05 ENCOUNTER — Ambulatory Visit: Payer: Self-pay

## 2021-12-05 DIAGNOSIS — L237 Allergic contact dermatitis due to plants, except food: Secondary | ICD-10-CM

## 2021-12-05 MED ORDER — CETIRIZINE HCL 10 MG PO TABS: 10.0000 mg | ORAL_TABLET | Freq: Every day | ORAL | 0 refills | Status: DC

## 2021-12-05 NOTE — Discharge Instructions (Addendum)
Use topical poison ivy rash medication such as calamine lotion.  Take Zyrtec as directed.  Follow up with your primary care provider if your symptoms are not improving.   ? ?

## 2021-12-05 NOTE — ED Triage Notes (Signed)
Triaged by provider  
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
## 2021-12-12 ENCOUNTER — Emergency Department (HOSPITAL_COMMUNITY)
Admission: EM | Admit: 2021-12-12 | Discharge: 2021-12-12 | Disposition: A | Discharge status: Home / Self Care | Payer: Medicaid Other | Attending: Emergency Medicine | Admitting: Emergency Medicine

## 2021-12-12 ENCOUNTER — Encounter (HOSPITAL_COMMUNITY): Payer: Self-pay

## 2021-12-12 ENCOUNTER — Other Ambulatory Visit: Payer: Self-pay

## 2021-12-12 DIAGNOSIS — K0889 Other specified disorders of teeth and supporting structures: Secondary | ICD-10-CM | POA: Diagnosis present

## 2021-12-12 DIAGNOSIS — K029 Dental caries, unspecified: Secondary | ICD-10-CM | POA: Diagnosis not present

## 2021-12-12 MED ORDER — AMOXICILLIN 250 MG PO CAPS
500.0000 mg | ORAL_CAPSULE | Freq: Once | ORAL | Status: AC
  Administered 2021-12-12: 500 mg via ORAL
  Filled 2021-12-12: qty 2

## 2021-12-12 MED ORDER — NAPROXEN 500 MG PO TABS: 500.0000 mg | ORAL_TABLET | Freq: Two times a day (BID) | ORAL | 0 refills | Status: DC

## 2021-12-12 MED ORDER — KETOROLAC TROMETHAMINE 30 MG/ML IJ SOLN
30.0000 mg | Freq: Once | INTRAMUSCULAR | Status: AC
  Administered 2021-12-12: 30 mg via INTRAMUSCULAR
  Filled 2021-12-12: qty 1

## 2021-12-12 MED ORDER — AMOXICILLIN 500 MG PO CAPS: 500.0000 mg | ORAL_CAPSULE | Freq: Three times a day (TID) | ORAL | 0 refills | Status: AC

## 2021-12-12 NOTE — Discharge Instructions (Addendum)
2 prescriptions have been sent to your pharmacy.  They are as follows: ?Amoxicillin-this is an antibiotic.  Take 1 capsule by mouth every 8 hours for the next 10 days.  Always take with food and plenty of water. ?Naproxen-this is an anti-inflammatory/pain medicine.  Take 1 tablet every 12 hours as needed for pain.  Always take with food and plenty of water. ? ?Return to the ED for new or worsening symptoms as discussed. ? ?Further dental office resources have been provided for you.  You may utilize these to try to find an earlier root canal date. ?

## 2021-12-12 NOTE — ED Notes (Signed)
Pt states she is nauseated  

## 2021-12-12 NOTE — ED Provider Notes (Signed)
?[facility] ?Provider Note ? ? ?CSN: [id number] ?Arrival date & time: 12/12/21  1050 ? ?  ? ?History ? ?Chief Complaint  ?Patient presents with  ? Dental Pain  ? ? ?Jessica [name] is a 31 y.o. female with chief complaint of dental pain.  States her first molar on her lower right jaw has been causing her pain, and that pieces of it continue to break away as she eats.  Saw dentist today, who said they would be able to provide a root canal for her but would not be able to do so until mid July.  Patient states she is in constant pain.  Pain described as sharp, constant, and throbbing.  Denies chest pain, palpitations, shortness of breath.  Denies swelling of the face or tongue, neck pain, or fever.  Denies difficulty swallowing, or opening her mouth.  Is not currently on antibiotics.  Requesting pain management. ? ?The history is provided by the patient and medical records.  ?Dental Pain ? ?  ? ?Home Medications ?Prior to Admission medications   ?Medication Sig Start Date End Date Taking? Authorizing Provider  ?amoxicillin (AMOXIL) 500 MG capsule Take 1 capsule (500 mg total) by mouth 3 (three) times daily for 10 days. 12/12/21 12/22/21 Yes Cecil Cobbs, PA-C  ?naproxen (NAPROSYN) 500 MG tablet Take 1 tablet (500 mg total) by mouth 2 (two) times daily. 12/12/21  Yes Cecil Cobbs, PA-C  ?albuterol (VENTOLIN HFA) 108 (90 Base) MCG/ACT inhaler Inhale 1-2 puffs into the lungs every 6 (six) hours as needed for wheezing or shortness of breath. 10/21/21   Wallis Bamberg, PA-C  ?atomoxetine (STRATTERA) 40 MG capsule Take 1 capsule (40 mg total) by mouth daily. ?Patient not taking: Reported on 11/22/2021 03/10/21   Clapacs, Jackquline Denmark, MD  ?brompheniramine-pseudoephedrine-DM 30-2-10 MG/5ML syrup Take 5 mLs by mouth 4 (four) times daily as needed. ?Patient not taking: Reported on 11/22/2021 06/24/21   Margaretann Loveless, PA-C  ?brompheniramine-pseudoephedrine-DM 30-2-10 MG/5ML syrup Take 5 mLs by mouth 4  (four) times daily as needed. ?Patient not taking: Reported on 11/22/2021 11/20/21   Particia Nearing, PA-C  ?budesonide-formoterol [facility]) 160-4.5 MCG/ACT inhaler Inhale 2 puffs into the lungs 2 (two) times daily. Rinse mouth with water after each use ?Patient not taking: Reported on 11/22/2021 11/20/21   Particia Nearing, PA-C  ?cetirizine (ZYRTEC ALLERGY) 10 MG tablet Take 1 tablet (10 mg total) by mouth daily. 12/05/21   Mickie Bail, NP  ?fluticasone (FLONASE) 50 MCG/ACT nasal spray Place 2 sprays into both nostrils daily. ?Patient not taking: Reported on 11/22/2021 06/24/21   Margaretann Loveless, PA-C  ?HYDROcodone bit-homatropine (HYCODAN) 5-1.5 MG/5ML syrup Take 5 mLs by mouth every 6 (six) hours as needed for cough. ?Patient not taking: Reported on 11/22/2021 08/01/21   Particia Nearing, PA-C  ?hydrOXYzine (ATARAX) 25 MG tablet Take 1 tablet (25 mg total) by mouth every 6 (six) hours. 11/22/21   Teressa Lower, PA-C  ?hydrOXYzine (ATARAX/VISTARIL) 50 MG tablet Take 1 tablet (50 mg total) by mouth 3 (three) times daily as needed for anxiety. ?Patient not taking: Reported on 11/22/2021 03/09/21   Clapacs, Jackquline Denmark, MD  ?ibuprofen (ADVIL) 600 MG tablet Take 1 tablet (600 mg total) by mouth every 6 (six) hours as needed. ?Patient not taking: Reported on 11/22/2021 10/03/21   Blue, Soijett A, PA-C  ?ipratropium (ATROVENT) 0.03 % nasal spray Place 2 sprays into both nostrils every 12 (twelve) hours. ?Patient not taking: Reported  on 11/22/2021 06/24/21   Margaretann Loveless, PA-C  ?mirtazapine (REMERON) 15 MG tablet Take 1 tablet (15 mg total) by mouth at bedtime. ?Patient not taking: Reported on 11/22/2021 03/09/21   Clapacs, Jackquline Denmark, MD  ?phenazopyridine (PYRIDIUM) 100 MG tablet Take 1 tablet (100 mg total) by mouth 3 (three) times daily as needed for pain. ?Patient not taking: Reported on 11/22/2021 03/10/21   Durward Parcel, FNP  ?prazosin (MINIPRESS) 2 MG capsule Take 1 capsule (2 mg total) by  mouth at bedtime. ?Patient not taking: Reported on 11/22/2021 03/09/21   Clapacs, Jackquline Denmark, MD  ?predniSONE (DELTASONE) 20 MG tablet Take 2 tablets (40 mg total) by mouth daily with breakfast. ?Patient not taking: Reported on 11/22/2021 11/07/21   Particia Nearing, PA-C  ?predniSONE (DELTASONE) 50 MG tablet Take 1 tablet (50 mg total) by mouth daily with breakfast. ?Patient not taking: Reported on 11/22/2021 10/21/21   Wallis Bamberg, PA-C  ?promethazine-dextromethorphan (PROMETHAZINE-DM) 6.25-15 MG/5ML syrup Take 5 mLs by mouth 4 (four) times daily as needed. ?Patient not taking: Reported on 11/22/2021 11/07/21   Particia Nearing, PA-C  ?pseudoephedrine (SUDAFED) 30 MG tablet Take 1 tablet (30 mg total) by mouth every 8 (eight) hours as needed for congestion. ?Patient not taking: Reported on 11/22/2021 10/31/21   Wallis Bamberg, PA-C  ?   ? ?Allergies    ?Atarax [hydroxyzine] and Trazodone and nefazodone   ? ?Review of Systems   ?Review of Systems  ?HENT:  Positive for dental problem.   ? ?Physical Exam ?Updated Vital Signs ?BP 114/67 (BP Location: Right Arm)   Pulse 63   Temp 97.9 ?F (36.6 ?C)   Resp 15   Ht 5\' 2"  (1.575 m)   Wt 59 kg   SpO2 100%   BMI 23.78 kg/m?  ?Physical Exam ?Vitals and nursing note reviewed.  ?Constitutional:   ?   General: She is not in acute distress. ?   Appearance: She is well-developed.  ?HENT:  ?   Head: Normocephalic and atraumatic.  ?   Mouth/Throat:  ? ?   Comments: Tooth fragment as indicated above.  Poor overall dentition.  No significant surrounding erythema, abscess, discharge, hemorrhage, or edema.  No evidence of tongue elevation or protrusion.  No dysphonia, cyanosis, stridor, drooling, trismus, dysphagia, or swelling of the neck. ?Eyes:  ?   Conjunctiva/sclera: Conjunctivae normal.  ?Cardiovascular:  ?   Rate and Rhythm: Normal rate and regular rhythm.  ?   Pulses: Normal pulses.  ?   Heart sounds: Normal heart sounds. No murmur heard. ?Pulmonary:  ?   Effort: Pulmonary  effort is normal. No respiratory distress.  ?   Breath sounds: Normal breath sounds.  ?Chest:  ?   Chest wall: No tenderness.  ?Abdominal:  ?   Palpations: Abdomen is soft.  ?   Tenderness: There is no abdominal tenderness.  ?Musculoskeletal:     ?   General: No swelling.  ?   Cervical back: Neck supple. No rigidity or tenderness.  ?Skin: ?   General: Skin is warm and dry.  ?   Capillary Refill: Capillary refill takes less than 2 seconds.  ?Neurological:  ?   Mental Status: She is alert and oriented to person, place, and time.  ?Psychiatric:     ?   Mood and Affect: Mood normal.  ? ? ?ED Results / Procedures / Treatments   ?Labs ?(all labs ordered are listed, but only abnormal results are displayed) ?Labs Reviewed - No  data to display ? ?EKG ?None ? ?Radiology ?No results found. ? ?Procedures ?Procedures  ? ? ?Medications Ordered in ED ?Medications  ?ketorolac (TORADOL) 30 MG/ML injection 30 mg (30 mg Intramuscular Given 12/12/21 1233)  ?amoxicillin (AMOXIL) capsule 500 mg (500 mg Oral Given 12/12/21 1232)  ? ? ?ED Course/ Medical Decision Making/ A&P ?  ?                        ?Medical Decision Making ?Amount and/or Complexity of Data Reviewed ?External Data Reviewed: notes. ?Labs:  Decision-making details documented in ED Course. ?Radiology:  Decision-making details documented in ED Course. ?ECG/medicine tests:  Decision-making details documented in ED Course. ? ?Risk ?OTC drugs. ?Prescription drug management. ? ? ?31 y.o. female presents to the ED for concern of Dental Pain ?  ?This involves an extensive number of treatment options, and is a complaint that carries with it a high risk of complications and morbidity.  The emergent differential diagnosis prior to evaluation includes, but is not limited to: Dental caries, dental abscess, Ludwig's angina ? ?This is not an exhaustive differential.  ? ?Past Medical History / Co-morbidities / Social History: ?Anxiety, polysubstance abuse, depression ?Social Determinants of  Health include polysubstance use, for which cessation counseling was provided. ? ?Additional History:  ?Internal and external records from outside source obtained and reviewed including ED visits, primary care ? ?Physi

## 2021-12-12 NOTE — ED Notes (Addendum)
Pt states the dentist she seen today did not give her an antibiotic for her dental pain that has been going on for 2 weeks.  Pt states she is to get a root canal in July.  ?

## 2021-12-12 NOTE — ED Notes (Signed)
Pt requesting something to eat and drink- water and saltine crackers provided for pt . ?

## 2021-12-12 NOTE — ED Triage Notes (Signed)
Reports that she is having dental pain to the R side of mouth.  States that she went to dentist today and nothing was done.  Resp even and unlabored.  Skin warm and dry.  nad ?

## 2022-09-23 ENCOUNTER — Encounter (HOSPITAL_COMMUNITY): Payer: Self-pay

## 2022-09-23 ENCOUNTER — Emergency Department (HOSPITAL_COMMUNITY): Payer: Medicaid Other

## 2022-09-23 ENCOUNTER — Other Ambulatory Visit: Payer: Self-pay

## 2022-09-23 ENCOUNTER — Emergency Department (HOSPITAL_COMMUNITY)
Admission: EM | Admit: 2022-09-23 | Discharge: 2022-09-23 | Disposition: A | Discharge status: Home / Self Care | Payer: Medicaid Other | Attending: Emergency Medicine | Admitting: Emergency Medicine

## 2022-09-23 DIAGNOSIS — R112 Nausea with vomiting, unspecified: Secondary | ICD-10-CM | POA: Insufficient documentation

## 2022-09-23 DIAGNOSIS — R350 Frequency of micturition: Secondary | ICD-10-CM | POA: Diagnosis not present

## 2022-09-23 DIAGNOSIS — M5432 Sciatica, left side: Secondary | ICD-10-CM | POA: Diagnosis present

## 2022-09-23 DIAGNOSIS — R3915 Urgency of urination: Secondary | ICD-10-CM | POA: Diagnosis not present

## 2022-09-23 LAB — URINALYSIS, ROUTINE W REFLEX MICROSCOPIC
Bilirubin Urine: NEGATIVE
Glucose, UA: NEGATIVE mg/dL
Hgb urine dipstick: NEGATIVE
Ketones, ur: NEGATIVE mg/dL
Leukocytes,Ua: NEGATIVE
Nitrite: NEGATIVE
Protein, ur: NEGATIVE mg/dL
Specific Gravity, Urine: 1.006 (ref 1.005–1.030)
pH: 7 (ref 5.0–8.0)

## 2022-09-23 LAB — COMPREHENSIVE METABOLIC PANEL
ALT: 12 U/L (ref 0–44)
AST: 17 U/L (ref 15–41)
Albumin: 3.8 g/dL (ref 3.5–5.0)
Alkaline Phosphatase: 37 U/L — ABNORMAL LOW (ref 38–126)
Anion gap: 7 (ref 5–15)
BUN: 12 mg/dL (ref 6–20)
CO2: 21 mmol/L — ABNORMAL LOW (ref 22–32)
Calcium: 8.8 mg/dL — ABNORMAL LOW (ref 8.9–10.3)
Chloride: 108 mmol/L (ref 98–111)
Creatinine, Ser: 0.83 mg/dL (ref 0.44–1.00)
GFR, Estimated: >60 mL/min (ref >60)
Glucose, Bld: 99 mg/dL (ref 70–99)
Potassium: 3.6 mmol/L (ref 3.5–5.1)
Sodium: 136 mmol/L (ref 135–145)
Total Bilirubin: 0.3 mg/dL (ref 0.3–1.2)
Total Protein: 6.3 g/dL — ABNORMAL LOW (ref 6.5–8.1)

## 2022-09-23 LAB — CBC
HCT: 37.5 % (ref 36.0–46.0)
Hemoglobin: 13 g/dL (ref 12.0–15.0)
MCH: 29.8 pg (ref 26.0–34.0)
MCHC: 34.7 g/dL (ref 30.0–36.0)
MCV: 86 fL (ref 80.0–100.0)
Platelets: 251 10*3/uL (ref 150–400)
RBC: 4.36 MIL/uL (ref 3.87–5.11)
RDW: 12.6 % (ref 11.5–15.5)
WBC: 7.2 10*3/uL (ref 4.0–10.5)
nRBC: 0 % (ref 0.0–0.2)

## 2022-09-23 LAB — LIPASE, BLOOD: Lipase: 53 U/L — ABNORMAL HIGH (ref 11–51)

## 2022-09-23 LAB — POC URINE PREG, ED
Preg Test, Ur: NEGATIVE
Preg Test, Ur: NEGATIVE

## 2022-09-23 MED ORDER — NAPROXEN 500 MG PO TABS: 500.0000 mg | ORAL_TABLET | Freq: Two times a day (BID) | ORAL | 0 refills | Status: DC

## 2022-09-23 MED ORDER — ONDANSETRON HCL 4 MG/2ML IJ SOLN
4.0000 mg | Freq: Once | INTRAMUSCULAR | Status: AC
  Administered 2022-09-23: 4 mg via INTRAVENOUS
  Filled 2022-09-23: qty 2

## 2022-09-23 MED ORDER — HYDROCODONE-ACETAMINOPHEN 5-325 MG PO TABS: 1.0000 | ORAL_TABLET | ORAL | 0 refills | Status: DC | PRN

## 2022-09-23 MED ORDER — MORPHINE SULFATE (PF) 4 MG/ML IV SOLN
4.0000 mg | Freq: Once | INTRAVENOUS | Status: AC
  Administered 2022-09-23: 4 mg via INTRAVENOUS
  Filled 2022-09-23: qty 1

## 2022-09-23 MED ORDER — PREDNISONE 50 MG PO TABS: 50.0000 mg | ORAL_TABLET | Freq: Every day | ORAL | 0 refills | Status: DC

## 2022-09-23 MED ORDER — SODIUM CHLORIDE 0.9 % IV BOLUS
1000.0000 mL | Freq: Once | INTRAVENOUS | Status: AC
  Administered 2022-09-23: 1000 mL via INTRAVENOUS

## 2022-09-23 MED ORDER — PREDNISONE 50 MG PO TABS
60.0000 mg | ORAL_TABLET | Freq: Once | ORAL | Status: AC
  Administered 2022-09-23: 60 mg via ORAL
  Filled 2022-09-23: qty 1

## 2022-09-23 MED ORDER — METHOCARBAMOL 1000 MG/10ML IJ SOLN
1000.0000 mg | Freq: Once | INTRAVENOUS | Status: DC
  Filled 2022-09-23: qty 10

## 2022-09-23 MED ORDER — PROCHLORPERAZINE EDISYLATE 10 MG/2ML IJ SOLN
10.0000 mg | Freq: Once | INTRAMUSCULAR | Status: AC
  Administered 2022-09-23: 10 mg via INTRAVENOUS
  Filled 2022-09-23: qty 2

## 2022-09-23 MED ORDER — KETOROLAC TROMETHAMINE 30 MG/ML IJ SOLN
30.0000 mg | Freq: Once | INTRAMUSCULAR | Status: AC
  Administered 2022-09-23: 30 mg via INTRAVENOUS
  Filled 2022-09-23: qty 1

## 2022-09-23 MED ORDER — CYCLOBENZAPRINE HCL 10 MG PO TABS: 10.0000 mg | ORAL_TABLET | Freq: Three times a day (TID) | ORAL | 0 refills | Status: DC | PRN

## 2022-09-23 NOTE — ED Triage Notes (Signed)
Pt presents with left flank pain that has gotten worse over last few days. Pt endorses nausea and dysuria.

## 2022-09-23 NOTE — Discharge Instructions (Signed)
Apply ice to your left flank area where it is painful.  Ice to be applied for 30 minutes at a time, 4 times a day.  You may add acetaminophen to the naproxen which has been ordered.  Naproxen and acetaminophen work on pain in different ways and work together for increased pain relief.

## 2022-09-23 NOTE — ED Provider Notes (Signed)
[facility] Provider Note   CSN: [id number] Arrival date & time: 09/23/22  0232     History  Chief Complaint  Patient presents with   Flank Pain    Jessica [name] is a 32 y.o. female.  The history is provided by the patient.  Flank Pain  She has history of attention deficit disorder, chronic back pain and comes in with a 4-day history of pain in the left flank with some radiation down the left leg.  There has been associated nausea and vomiting.  She has noted some burning with urination and urinary urgency and frequency.  She denies fever or chills.  She has not taken anything at home for pain.   Home Medications Prior to Admission medications   Medication Sig Start Date End Date Taking? Authorizing Provider  albuterol (VENTOLIN HFA) 108 (90 Base) MCG/ACT inhaler Inhale 1-2 puffs into the lungs every 6 (six) hours as needed for wheezing or shortness of breath. 10/21/21   Jaynee Eagles, PA-C  atomoxetine (STRATTERA) 40 MG capsule Take 1 capsule (40 mg total) by mouth daily. Patient not taking: Reported on 11/22/2021 03/10/21   Clapacs, Madie Reno, MD  brompheniramine-pseudoephedrine-DM 30-2-10 MG/5ML syrup Take 5 mLs by mouth 4 (four) times daily as needed. Patient not taking: Reported on 11/22/2021 06/24/21   Mar Daring, PA-C  brompheniramine-pseudoephedrine-DM 30-2-10 MG/5ML syrup Take 5 mLs by mouth 4 (four) times daily as needed. Patient not taking: Reported on 11/22/2021 11/20/21   Volney American, PA-C  budesonide-formoterol [facility]) 160-4.5 MCG/ACT inhaler Inhale 2 puffs into the lungs 2 (two) times daily. Rinse mouth with water after each use Patient not taking: Reported on 11/22/2021 11/20/21   Volney American, PA-C  cetirizine (ZYRTEC ALLERGY) 10 MG tablet Take 1 tablet (10 mg total) by mouth daily. 12/05/21   Sharion Balloon, NP  fluticasone (FLONASE) 50 MCG/ACT nasal spray Place 2 sprays into both nostrils  daily. Patient not taking: Reported on 11/22/2021 06/24/21   Mar Daring, PA-C  HYDROcodone bit-homatropine [facility]) 5-1.5 MG/5ML syrup Take 5 mLs by mouth every 6 (six) hours as needed for cough. Patient not taking: Reported on 11/22/2021 08/01/21   Volney American, PA-C  hydrOXYzine (ATARAX) 25 MG tablet Take 1 tablet (25 mg total) by mouth every 6 (six) hours. 11/22/21   Hendricks Limes, PA-C  hydrOXYzine (ATARAX/VISTARIL) 50 MG tablet Take 1 tablet (50 mg total) by mouth 3 (three) times daily as needed for anxiety. Patient not taking: Reported on 11/22/2021 03/09/21   Clapacs, Madie Reno, MD  ibuprofen (ADVIL) 600 MG tablet Take 1 tablet (600 mg total) by mouth every 6 (six) hours as needed. Patient not taking: Reported on 11/22/2021 10/03/21   Blue, Soijett A, PA-C  ipratropium (ATROVENT) 0.03 % nasal spray Place 2 sprays into both nostrils every 12 (twelve) hours. Patient not taking: Reported on 11/22/2021 06/24/21   Mar Daring, PA-C  mirtazapine (REMERON) 15 MG tablet Take 1 tablet (15 mg total) by mouth at bedtime. Patient not taking: Reported on 11/22/2021 03/09/21   Clapacs, Madie Reno, MD  naproxen (NAPROSYN) 500 MG tablet Take 1 tablet (500 mg total) by mouth 2 (two) times daily. 0000000   Prince Rome, PA-C  phenazopyridine (PYRIDIUM) 100 MG tablet Take 1 tablet (100 mg total) by mouth 3 (three) times daily as needed for pain. Patient not taking: Reported on 11/22/2021 03/10/21   Emerson Monte, FNP  prazosin (MINIPRESS)  2 MG capsule Take 1 capsule (2 mg total) by mouth at bedtime. Patient not taking: Reported on 11/22/2021 03/09/21   Clapacs, Madie Reno, MD  predniSONE (DELTASONE) 20 MG tablet Take 2 tablets (40 mg total) by mouth daily with breakfast. Patient not taking: Reported on 11/22/2021 11/07/21   Volney American, PA-C  predniSONE (DELTASONE) 50 MG tablet Take 1 tablet (50 mg total) by mouth daily with breakfast. Patient not taking: Reported on 11/22/2021  10/21/21   Jaynee Eagles, PA-C  promethazine-dextromethorphan (PROMETHAZINE-DM) 6.25-15 MG/5ML syrup Take 5 mLs by mouth 4 (four) times daily as needed. Patient not taking: Reported on 11/22/2021 11/07/21   Volney American, PA-C  pseudoephedrine (SUDAFED) 30 MG tablet Take 1 tablet (30 mg total) by mouth every 8 (eight) hours as needed for congestion. Patient not taking: Reported on 11/22/2021 10/31/21   Jaynee Eagles, PA-C      Allergies    Atarax [hydroxyzine] and Trazodone and nefazodone    Review of Systems   Review of Systems  Genitourinary:  Positive for flank pain.  All other systems reviewed and are negative.   Physical Exam Updated Vital Signs BP 125/84   Pulse 91   Resp (!) 22   Wt 63.5 kg   SpO2 99%   BMI 25.61 kg/m  Physical Exam Vitals and nursing note reviewed.   32 year old female, resting comfortably and in no acute distress. Vital signs are significant for slightly elevated respiratory rate. Oxygen saturation is 99%, which is normal. Head is normocephalic and atraumatic. PERRLA, EOMI. Oropharynx is clear. Neck is nontender and supple without adenopathy or JVD. Back is nontender in the midline.  There is moderate left CVA tenderness.  Straight leg raise is positive on the left at 60 degrees. Lungs are clear without rales, wheezes, or rhonchi. Chest is nontender. Heart has regular rate and rhythm without murmur. Abdomen is soft, flat, nontender. Extremities have no cyanosis or edema, full range of motion is present. Skin is warm and dry without rash. Neurologic: Mental status is normal, cranial nerves are intact, moves all extremities equally.  ED Results / Procedures / Treatments   Labs (all labs ordered are listed, but only abnormal results are displayed) Labs Reviewed  LIPASE, BLOOD - Abnormal; Notable for the following components:      Result Value   Lipase 53 (*)    All other components within normal limits  COMPREHENSIVE METABOLIC PANEL - Abnormal;  Notable for the following components:   CO2 21 (*)    Calcium 8.8 (*)    Total Protein 6.3 (*)    Alkaline Phosphatase 37 (*)    All other components within normal limits  URINALYSIS, ROUTINE W REFLEX MICROSCOPIC - Abnormal; Notable for the following components:   Color, Urine STRAW (*)    All other components within normal limits  CBC  POC URINE PREG, ED  POC URINE PREG, ED   Radiology CT Renal Stone Study  Result Date: 09/23/2022 CLINICAL DATA:  Abdominal/flank pain with stone suspected. Nausea and dysuria EXAM: CT ABDOMEN AND PELVIS WITHOUT CONTRAST TECHNIQUE: Multidetector CT imaging of the abdomen and pelvis was performed following the standard protocol without IV contrast. RADIATION DOSE REDUCTION: This exam was performed according to the departmental dose-optimization program which includes automated exposure control, adjustment of the mA and/or kV according to patient size and/or use of iterative reconstruction technique. COMPARISON:  06/04/2020 FINDINGS: Lower chest:  No contributory findings. Hepatobiliary: No focal liver abnormality.No evidence of biliary obstruction  or stone. Pancreas: Unremarkable. Spleen: Unremarkable. Adrenals/Urinary Tract: Negative adrenals. No hydronephrosis or ureteral stone. Punctate right interpolar calculus. Unremarkable bladder. Stomach/Bowel:  No obstruction. No appendicitis. Vascular/Lymphatic: No acute vascular abnormality. No mass or adenopathy. Reproductive:No pathologic findings. Other: No ascites or pneumoperitoneum. Musculoskeletal: No acute abnormalities.  Chronic L5 pars defects. IMPRESSION: 1. No acute finding. 2. Punctate right renal calculus. Electronically Signed   By: Jorje Guild M.D.   On: 09/23/2022 04:28    Procedures Procedures    Medications Ordered in ED Medications  morphine (PF) 4 MG/ML injection 4 mg (has no administration in time range)  ketorolac (TORADOL) 30 MG/ML injection 30 mg (has no administration in time range)   ondansetron (ZOFRAN) injection 4 mg (has no administration in time range)  sodium chloride 0.9 % bolus 1,000 mL (has no administration in time range)    ED Course/ Medical Decision Making/ A&P                             Medical Decision Making Amount and/or Complexity of Data Reviewed Labs: ordered. Radiology: ordered.  Risk Prescription drug management.   Left flank pain which has aspects which are suggestive of renal colic and some aspects consistent with sciatica.  In the differential diagnosis of renal colic would need to include urolithiasis and pyelonephritis.  I have ordered IV fluids, ketorolac, morphine, ondansetron and I have ordered a renal stone protocol CT scan of abdomen and pelvis.  I have reviewed her laboratory tests and my interpretation is borderline elevated lipase of uncertain clinical significance, CBC and urinalysis are pending.  I have reviewed her past records, and on 06/04/2020 she had a renal stone protocol CT scan which showed a calculus in the right kidney but none in the left.  At that time, she was seen in the emergency department and diagnosed with pyelonephritis.  CBC is normal, urinalysis is normal without evidence of infection.  CT scan shows a punctate right renal calculus but no ureteral calculi or hydronephrosis and no acute finding.  I have independently viewed the images, and agree with the radiologist's interpretation.  She has not noted improvement with above-noted treatment.  At this point, pain seems to be left-sided sciatica.  I have ordered additional morphine and intravenous methocarbamol.  Patient has refused methocarbamol.  I am ordering a dose of prednisone and I am discharging her with prescriptions for naproxen, cyclobenzaprine, prednisone and a take-home pack of hydrocodone-acetaminophen.  Follow-up with primary care provider in 1 week.  Final Clinical Impression(s) / ED Diagnoses Final diagnoses:  Left sided sciatica    Rx / DC  Orders ED Discharge Orders          Ordered    naproxen (NAPROSYN) 500 MG tablet  2 times daily        09/23/22 0522    cyclobenzaprine (FLEXERIL) 10 MG tablet  3 times daily PRN        09/23/22 0522    predniSONE (DELTASONE) 50 MG tablet  Daily        09/23/22 0522    HYDROcodone-acetaminophen (NORCO) 5-325 MG tablet  Every 4 hours PRN        09/23/22 [id number]              Jamicah Anstead, MD [id number] [phone]

## 2022-09-24 MED FILL — Hydrocodone-Acetaminophen Tab 5-325 MG: ORAL | Qty: 6 | Status: AC

## 2022-09-25 ENCOUNTER — Other Ambulatory Visit: Payer: Self-pay

## 2022-09-25 ENCOUNTER — Emergency Department (HOSPITAL_COMMUNITY)
Admission: EM | Admit: 2022-09-25 | Discharge: 2022-09-25 | Disposition: A | Discharge status: Home / Self Care | Payer: Medicaid Other | Attending: Student | Admitting: Student

## 2022-09-25 ENCOUNTER — Emergency Department (HOSPITAL_COMMUNITY): Payer: Medicaid Other

## 2022-09-25 ENCOUNTER — Encounter (HOSPITAL_COMMUNITY): Payer: Self-pay | Admitting: Emergency Medicine

## 2022-09-25 DIAGNOSIS — S93491A Sprain of other ligament of right ankle, initial encounter: Secondary | ICD-10-CM | POA: Diagnosis not present

## 2022-09-25 DIAGNOSIS — W010XXA Fall on same level from slipping, tripping and stumbling without subsequent striking against object, initial encounter: Secondary | ICD-10-CM | POA: Diagnosis not present

## 2022-09-25 DIAGNOSIS — S99911A Unspecified injury of right ankle, initial encounter: Secondary | ICD-10-CM | POA: Diagnosis present

## 2022-09-25 MED ORDER — IBUPROFEN 600 MG PO TABS: 600.0000 mg | ORAL_TABLET | Freq: Four times a day (QID) | ORAL | 0 refills | Status: DC | PRN

## 2022-09-25 MED ORDER — IBUPROFEN 400 MG PO TABS
600.0000 mg | ORAL_TABLET | Freq: Once | ORAL | Status: AC
  Administered 2022-09-25: 600 mg via ORAL
  Filled 2022-09-25: qty 2

## 2022-09-25 NOTE — ED Provider Notes (Signed)
[facility] Provider Note   CSN: [id number] Arrival date & time: 09/25/22  1020     History  Chief Complaint  Patient presents with   Ankle Pain   Jessica [name] is a 32 y.o. female who complains of inversion injury to the right ankle 16 hours ago. There is pain and swelling at the lateral aspect of that ankle. The patient was not able to bear weight directly after the injury. But is now able to toe touch ambulate     Ankle Pain      Home Medications Prior to Admission medications   Medication Sig Start Date End Date Taking? Authorizing Provider  ibuprofen (ADVIL) 600 MG tablet Take 1 tablet (600 mg total) by mouth every 6 (six) hours as needed. 09/25/22  Yes Madelyn Tlatelpa, PA-C  albuterol (VENTOLIN HFA) 108 (90 Base) MCG/ACT inhaler Inhale 1-2 puffs into the lungs every 6 (six) hours as needed for wheezing or shortness of breath. 10/21/21   Jaynee Eagles, PA-C  atomoxetine (STRATTERA) 40 MG capsule Take 1 capsule (40 mg total) by mouth daily. Patient not taking: Reported on 11/22/2021 03/10/21   Clapacs, Madie Reno, MD  budesonide-formoterol [facility] 160-4.5 MCG/ACT inhaler Inhale 2 puffs into the lungs 2 (two) times daily. Rinse mouth with water after each use Patient not taking: Reported on 11/22/2021 11/20/21   Volney American, PA-C  cetirizine (ZYRTEC ALLERGY) 10 MG tablet Take 1 tablet (10 mg total) by mouth daily. 12/05/21   Sharion Balloon, NP  cyclobenzaprine (FLEXERIL) 10 MG tablet Take 1 tablet (10 mg total) by mouth 3 (three) times daily as needed for muscle spasms. [id number]   Delora Fuel, MD  HYDROcodone-acetaminophen (NORCO) 5-325 MG tablet Take 1 tablet by mouth every 4 (four) hours as needed for moderate pain. [id number]   Delora Fuel, MD  hydrOXYzine (ATARAX) 25 MG tablet Take 1 tablet (25 mg total) by mouth every 6 (six) hours. 11/22/21   Myna Bright M, PA-C  ipratropium (ATROVENT) 0.03 % nasal spray Place 2 sprays  into both nostrils every 12 (twelve) hours. Patient not taking: Reported on 11/22/2021 06/24/21   Mar Daring, PA-C  mirtazapine (REMERON) 15 MG tablet Take 1 tablet (15 mg total) by mouth at bedtime. Patient not taking: Reported on 11/22/2021 03/09/21   Clapacs, Madie Reno, MD  naproxen (NAPROSYN) 500 MG tablet Take 1 tablet (500 mg total) by mouth 2 (two) times daily. [id number]   Delora Fuel, MD  prazosin (MINIPRESS) 2 MG capsule Take 1 capsule (2 mg total) by mouth at bedtime. Patient not taking: Reported on 11/22/2021 03/09/21   Clapacs, Madie Reno, MD  predniSONE (DELTASONE) 50 MG tablet Take 1 tablet (50 mg total) by mouth daily. [id number]   Delora Fuel, MD      Allergies    Atarax [hydroxyzine] and Trazodone and nefazodone    Review of Systems   Review of Systems  Physical Exam Updated Vital Signs BP 114/73   Pulse 83   Temp 97.9 F (36.6 C) (Oral)   Resp 18   LMP 08/11/2022   SpO2 100%  Physical Exam Vitals and nursing note reviewed.  Constitutional:      General: She is not in acute distress.    Appearance: She is well-developed. She is not diaphoretic.  HENT:     Head: Normocephalic and atraumatic.     Right Ear: External ear normal.     Left Ear: External ear normal.  Nose: Nose normal.     Mouth/Throat:     Mouth: Mucous membranes are moist.  Eyes:     General: No scleral icterus.    Conjunctiva/sclera: Conjunctivae normal.  Cardiovascular:     Rate and Rhythm: Normal rate and regular rhythm.     Heart sounds: Normal heart sounds. No murmur heard.    No friction rub. No gallop.  Pulmonary:     Effort: Pulmonary effort is normal. No respiratory distress.     Breath sounds: Normal breath sounds.  Abdominal:     General: Bowel sounds are normal. There is no distension.     Palpations: Abdomen is soft. There is no mass.     Tenderness: There is no abdominal tenderness. There is no guarding.  Musculoskeletal:     Cervical back: Normal range of motion.      Comments: There is swelling and tenderness over the lateral malleolus.No overt deformity. Mild bruising. No tenderness over the medial aspect of the ankle. The fifth metatarsal is not tender. The ankle joint is intact without excessive opening on stressing.   Skin:    General: Skin is warm and dry.  Neurological:     Mental Status: She is alert and oriented to person, place, and time.  Psychiatric:        Behavior: Behavior normal.     ED Results / Procedures / Treatments   Labs (all labs ordered are listed, but only abnormal results are displayed) Labs Reviewed - No data to display  EKG None  Radiology DG Ankle Complete Right  Result Date: 09/25/2022 CLINICAL DATA:  Right ankle pain and bruising after fall. EXAM: RIGHT ANKLE - COMPLETE 3+ VIEW COMPARISON:  None Available. FINDINGS: There is no evidence of fracture, dislocation, or joint effusion. There is no evidence of arthropathy or other focal bone abnormality. Soft tissues are unremarkable. IMPRESSION: Negative. Electronically Signed   By: Aletta Edouard M.D.   On: 09/25/2022 11:15    Procedures Procedures    Medications Ordered in ED Medications  ibuprofen (ADVIL) tablet 600 mg (600 mg Oral Given 09/25/22 1118)    ED Course/ Medical Decision Making/ A&P                             Medical Decision Making I personally visualized and interpreted the images using our PACS system. Acute findings include:  none Patient X-Ray negative for obvious fracture or dislocation. Pain managed in ED.   Home Care: Rest and elevate the injured ankle, apply ice intermittently. Use crutches without weight bearing until able to comfortable bear partial weight, then progress to full weight bearing as tolerated. Splint applied. See ortho prn.  Patient will be dc home & is agreeable with above plan.   Amount and/or Complexity of Data Reviewed Radiology: ordered and independent interpretation performed.  Risk Prescription drug  management.           Final Clinical Impression(s) / ED Diagnoses Final diagnoses:  Sprain of anterior talofibular ligament of right ankle, initial encounter    Rx / DC Orders ED Discharge Orders          Ordered    ibuprofen (ADVIL) 600 MG tablet  Every 6 hours PRN        09/25/22 1120              Margarita Mail, PA-C 09/25/22 [place], [place], MD 09/25/22 2051

## 2022-09-25 NOTE — Discharge Instructions (Signed)
General instructions Rest your ankle. Do not use the injured limb to support your body weight until your health care provider says that you can. Use crutches as told by your health care provider. Do not use any products that contain nicotine or tobacco, such as cigarettes, e-cigarettes, and chewing tobacco. If you need help quitting, ask your health care provider. Keep all follow-up visits as told by your health care provider. This is important. Contact a health care provider if: You have rapidly increasing bruising or swelling. Your pain is not relieved with medicine. Get help right away if: Your foot or toes become numb or blue. You have severe pain that gets worse.

## 2022-09-25 NOTE — ED Triage Notes (Signed)
Pt tripped and fell last night, c/o right lateral ankle pain. Mild swelling noted. Color wnl and pedal pulse wnl.

## 2022-09-30 ENCOUNTER — Emergency Department (HOSPITAL_COMMUNITY)
Admission: EM | Admit: 2022-09-30 | Discharge: 2022-09-30 | Disposition: A | Discharge status: Home / Self Care | Payer: Medicaid Other | Attending: Emergency Medicine | Admitting: Emergency Medicine

## 2022-09-30 ENCOUNTER — Encounter (HOSPITAL_COMMUNITY): Payer: Self-pay | Admitting: Emergency Medicine

## 2022-09-30 ENCOUNTER — Ambulatory Visit: Payer: Medicaid Other | Admitting: Orthopaedic Surgery

## 2022-09-30 ENCOUNTER — Encounter: Payer: Self-pay | Admitting: Orthopaedic Surgery

## 2022-09-30 ENCOUNTER — Other Ambulatory Visit: Payer: Self-pay

## 2022-09-30 VITALS — BP 114/77 | HR 90 | Ht 62.0 in | Wt 135.0 lb

## 2022-09-30 DIAGNOSIS — X501XXA Overexertion from prolonged static or awkward postures, initial encounter: Secondary | ICD-10-CM | POA: Insufficient documentation

## 2022-09-30 DIAGNOSIS — R110 Nausea: Secondary | ICD-10-CM | POA: Insufficient documentation

## 2022-09-30 DIAGNOSIS — S96911A Strain of unspecified muscle and tendon at ankle and foot level, right foot, initial encounter: Secondary | ICD-10-CM | POA: Diagnosis not present

## 2022-09-30 DIAGNOSIS — G8929 Other chronic pain: Secondary | ICD-10-CM

## 2022-09-30 DIAGNOSIS — Y9302 Activity, running: Secondary | ICD-10-CM | POA: Insufficient documentation

## 2022-09-30 DIAGNOSIS — G43909 Migraine, unspecified, not intractable, without status migrainosus: Secondary | ICD-10-CM | POA: Insufficient documentation

## 2022-09-30 DIAGNOSIS — S93401A Sprain of unspecified ligament of right ankle, initial encounter: Secondary | ICD-10-CM

## 2022-09-30 DIAGNOSIS — G4700 Insomnia, unspecified: Secondary | ICD-10-CM | POA: Insufficient documentation

## 2022-09-30 DIAGNOSIS — M25571 Pain in right ankle and joints of right foot: Secondary | ICD-10-CM | POA: Diagnosis not present

## 2022-09-30 DIAGNOSIS — R300 Dysuria: Secondary | ICD-10-CM | POA: Insufficient documentation

## 2022-09-30 HISTORY — DX: Other chronic pain: G89.29

## 2022-09-30 MED ORDER — CELECOXIB 200 MG PO CAPS: 200.0000 mg | ORAL_CAPSULE | Freq: Two times a day (BID) | ORAL | 0 refills | Status: DC

## 2022-09-30 MED ORDER — HYDROCODONE-ACETAMINOPHEN 5-325 MG PO TABS: ORAL_TABLET | ORAL | 0 refills | Status: DC

## 2022-09-30 NOTE — ED Provider Notes (Signed)
  [facility] Provider Note   CSN: [id number] Arrival date & time: 09/30/22  0214     History  Chief Complaint  Patient presents with   Ankle Pain    Jessica [name] is a 32 y.o. female.  Patient returns to the emergency department with persistent ankle pain.  Patient reports that she was running and stepped in a hole, twisted her ankle.  She was seen in the ED with the original injury and had x-rays.  She was told that it was sprained but pain is not improving.       Home Medications Prior to Admission medications   Medication Sig Start Date End Date Taking? Authorizing Provider  celecoxib (CELEBREX) 200 MG capsule Take 1 capsule (200 mg total) by mouth 2 (two) times daily. 09/30/22  Yes Jodey Burbano, Gwenyth Allegra, MD  albuterol (VENTOLIN HFA) 108 (90 Base) MCG/ACT inhaler Inhale 1-2 puffs into the lungs every 6 (six) hours as needed for wheezing or shortness of breath. 10/21/21   Jaynee Eagles, PA-C  cetirizine (ZYRTEC ALLERGY) 10 MG tablet Take 1 tablet (10 mg total) by mouth daily. 12/05/21   Sharion Balloon, NP      Allergies    Atarax [hydroxyzine] and Trazodone and nefazodone    Review of Systems   Review of Systems  Physical Exam Updated Vital Signs BP 121/71   Pulse 71   Temp 97.9 F (36.6 C)   Resp 18   Ht 5' 2"$  (1.575 m)   Wt 61.2 kg   LMP 09/27/2022 (Exact Date)   SpO2 97%   BMI 24.69 kg/m  Physical Exam Vitals and nursing note reviewed.  Constitutional:      Appearance: Normal appearance.  Musculoskeletal:     Right ankle: No swelling, deformity or ecchymosis. Tenderness present over the lateral malleolus. No base of 5th metatarsal or proximal fibula tenderness. Decreased range of motion.     Right Achilles Tendon: Normal.  Skin:    Findings: No bruising, ecchymosis or erythema.  Neurological:     Mental Status: She is alert.     Cranial Nerves: Cranial nerves 2-12 are intact.     Sensory: Sensation is intact.      Motor: Motor function is intact.     ED Results / Procedures / Treatments   Labs (all labs ordered are listed, but only abnormal results are displayed) Labs Reviewed - No data to display  EKG None  Radiology No results found.  Procedures Procedures    Medications Ordered in ED Medications - No data to display  ED Course/ Medical Decision Making/ A&P                             Medical Decision Making  Patient reports persistent pain at the ankle.  X-ray was reviewed, was negative.  Examination without any significant swelling or deformity.  Will place in cam walker.  Follow-up with Ortho if not improving in 1 week.        Final Clinical Impression(s) / ED Diagnoses Final diagnoses:  None    Rx / DC Orders ED Discharge Orders          Ordered    celecoxib (CELEBREX) 200 MG capsule  2 times daily        09/30/22 0323              Orpah Greek, MD 09/30/22 [phone]

## 2022-09-30 NOTE — Progress Notes (Signed)
Subjective:    Patient ID: Jessica [name], female    DOB: Jun 28, 1991, 32 y.o.   MRN: [id number]  HPI She stepped into a deep hole and twisted her right ankle and foot on 09-25-22.  She went to the ER.  I have reviewed the notes.  X-rays were negative for fracture. She was given a CAM walker but she is not using it today as she could not drive her car with it on.  I have suggested she put it on in the car after getting to her destination.  She has no other injury.  I have independently reviewed and interpreted x-rays of this patient done at another site by another physician or qualified health professional.  She continues to have lateral ankle pain.   Review of Systems  Constitutional:  Positive for activity change.  Musculoskeletal:  Positive for gait problem and joint swelling.  All other systems reviewed and are negative. For Review of Systems, all other systems reviewed and are negative.  The following is a summary of the past history medically, past history surgically, known current medicines, social history and family history.  This information is gathered electronically by the computer from prior information and documentation.  I review this each visit and have found including this information at this point in the chart is beneficial and informative.   Past Medical History:  Diagnosis Date   Abnormal Pap smear    ADHD (attention deficit hyperactivity disorder)    Anxiety    Chronic back pain    Depression    Migraine headache    UTI (lower urinary tract infection) 01/12/2013   Vaginal Pap smear, abnormal     Past Surgical History:  Procedure Laterality Date   CESAREAN SECTION N/A 09/08/2013   Procedure: CESAREAN SECTION;  Surgeon: Osborne Oman, MD;  Location: [facility];  Service: Obstetrics;  Laterality: N/A;   CESAREAN SECTION N/A 02/10/2018   Procedure: REPEAT CESAREAN SECTION;  Surgeon: Caren Macadam, MD;  Location: [facility];  Service: Obstetrics;   Laterality: N/A;   TOOTH EXTRACTION     TUBAL LIGATION Bilateral 04/08/2018   Procedure: LAPAROSCOPIC BILATERAL TUBAL STERILIZATION WITH FALLOPE RING APPLICATION;  Surgeon: Jonnie Kind, MD;  Location: [facility];  Service: Gynecology;  Laterality: Bilateral;    Current Outpatient Medications on File Prior to Visit  Medication Sig Dispense Refill   ALPRAZolam (XANAX) 0.5 MG tablet Take 0.5 mg by mouth at bedtime as needed for anxiety.     No current facility-administered medications on file prior to visit.    Social History   Socioeconomic History   Marital status: Single    Spouse name: Not on file   Number of children: 2   Years of education: Not on file   Highest education level: Not on file  Occupational History   Not on file  Tobacco Use   Smoking status: Every Day    Packs/day: 1.00    Types: Cigarettes   Smokeless tobacco: Never  Vaping Use   Vaping Use: Never used  Substance and Sexual Activity   Alcohol use: Not Currently   Drug use: No   Sexual activity: Not Currently    Birth control/protection: Surgical    Comment: tubal  Other Topics Concern   Not on file  Social History Narrative   ** Merged History Encounter **       ** Merged History Encounter **       Social Determinants of Health  Financial Resource Strain: Not on file  Food Insecurity: Not on file  Transportation Needs: Not on file  Physical Activity: Not on file  Stress: Not on file  Social Connections: Not on file  Intimate Partner Violence: Not on file    Family History  Problem Relation Age of Onset   Drug abuse Mother     BP 114/77   Pulse 90   Ht 5' 2"$  (1.575 m)   Wt 135 lb (61.2 kg)   LMP 08/11/2022   BMI 24.69 kg/m   Body mass index is 24.69 kg/m.      Objective:   Physical Exam Vitals and nursing note reviewed. Exam conducted with a chaperone present.  Constitutional:      Appearance: She is well-developed.  HENT:     Head: Normocephalic and atraumatic.  Eyes:      Conjunctiva/sclera: Conjunctivae normal.     Pupils: Pupils are equal, round, and reactive to light.  Cardiovascular:     Rate and Rhythm: Normal rate and regular rhythm.  Pulmonary:     Effort: Pulmonary effort is normal.  Abdominal:     Palpations: Abdomen is soft.  Musculoskeletal:     Cervical back: Normal range of motion and neck supple.       Feet:  Skin:    General: Skin is warm and dry.  Neurological:     Mental Status: She is alert and oriented to person, place, and time.     Cranial Nerves: No cranial nerve deficit.     Motor: No abnormal muscle tone.     Coordination: Coordination normal.     Deep Tendon Reflexes: Reflexes are normal and symmetric. Reflexes normal.  Psychiatric:        Behavior: Behavior normal.        Thought Content: Thought content normal.        Judgment: Judgment normal.           Assessment & Plan:   Encounter Diagnosis  Name Primary?   Strain of right ankle, initial encounter Yes   Continue the CAM walker.  I have given instructions for Contrast Baths.  Return in two weeks.  I have reviewed the [place] web site prior to prescribing narcotic medicine for this patient.  Call if any problem.  Precautions discussed.  Electronically Signed Sanjuana Kava, MD 2/20/202410:59 AM

## 2022-09-30 NOTE — ED Triage Notes (Signed)
Pt c/o right ankle pain that radiates to the knee. Pt was seen for the same previously. Pt states pain has gotten worse since seen in the ED. Pt ambulated to room. NAD

## 2022-10-02 ENCOUNTER — Encounter (HOSPITAL_COMMUNITY): Payer: Self-pay

## 2022-10-02 ENCOUNTER — Other Ambulatory Visit (HOSPITAL_COMMUNITY): Payer: Self-pay | Admitting: Family Medicine

## 2022-10-02 ENCOUNTER — Other Ambulatory Visit: Payer: Self-pay

## 2022-10-02 DIAGNOSIS — Z5321 Procedure and treatment not carried out due to patient leaving prior to being seen by health care provider: Secondary | ICD-10-CM | POA: Diagnosis not present

## 2022-10-02 DIAGNOSIS — M25571 Pain in right ankle and joints of right foot: Secondary | ICD-10-CM

## 2022-10-02 DIAGNOSIS — R300 Dysuria: Secondary | ICD-10-CM | POA: Diagnosis present

## 2022-10-02 LAB — URINALYSIS, ROUTINE W REFLEX MICROSCOPIC
Bilirubin Urine: NEGATIVE
Glucose, UA: NEGATIVE mg/dL
Hgb urine dipstick: NEGATIVE
Ketones, ur: NEGATIVE mg/dL
Nitrite: NEGATIVE
Protein, ur: NEGATIVE mg/dL
Specific Gravity, Urine: 1.023 (ref 1.005–1.030)
pH: 5 (ref 5.0–8.0)

## 2022-10-02 NOTE — ED Triage Notes (Signed)
Pt reports burning with urination x 2 weeks.

## 2022-10-03 ENCOUNTER — Ambulatory Visit
Admission: EM | Admit: 2022-10-03 | Discharge: 2022-10-03 | Disposition: A | Discharge status: Home / Self Care | Payer: Medicaid Other | Attending: Family Medicine | Admitting: Family Medicine

## 2022-10-03 ENCOUNTER — Emergency Department (HOSPITAL_COMMUNITY)
Admission: EM | Admit: 2022-10-03 | Discharge: 2022-10-03 | Discharge status: Against Medical Advice | Payer: Medicaid Other | Attending: Emergency Medicine | Admitting: Emergency Medicine

## 2022-10-03 DIAGNOSIS — R300 Dysuria: Secondary | ICD-10-CM

## 2022-10-03 LAB — POCT URINALYSIS DIP (MANUAL ENTRY)
Bilirubin, UA: NEGATIVE
Blood, UA: NEGATIVE
Glucose, UA: NEGATIVE mg/dL
Ketones, POC UA: NEGATIVE mg/dL
Leukocytes, UA: NEGATIVE
Nitrite, UA: NEGATIVE
Protein Ur, POC: NEGATIVE mg/dL
Spec Grav, UA: 1.015 (ref 1.010–1.025)
Urobilinogen, UA: 0.2 E.U./dL
pH, UA: 5.5 (ref 5.0–8.0)

## 2022-10-03 NOTE — ED Triage Notes (Signed)
Pt reports it burns when she urinates x 2 weeks.
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
## 2022-10-06 ENCOUNTER — Telehealth (HOSPITAL_COMMUNITY): Payer: Self-pay | Admitting: Emergency Medicine

## 2022-10-06 LAB — CERVICOVAGINAL ANCILLARY ONLY
Bacterial Vaginitis (gardnerella): POSITIVE — AB
Candida Glabrata: NEGATIVE
Candida Vaginitis: NEGATIVE
Chlamydia: NEGATIVE
Comment: NEGATIVE
Comment: NEGATIVE
Comment: NEGATIVE
Comment: NEGATIVE
Comment: NEGATIVE
Comment: NORMAL
Neisseria Gonorrhea: NEGATIVE
Trichomonas: NEGATIVE

## 2022-10-06 MED ORDER — METRONIDAZOLE 500 MG PO TABS: 500.0000 mg | ORAL_TABLET | Freq: Two times a day (BID) | ORAL | 0 refills | Status: DC

## 2022-10-07 ENCOUNTER — Encounter: Payer: Self-pay | Admitting: Orthopaedic Surgery

## 2022-10-07 ENCOUNTER — Ambulatory Visit: Payer: Medicaid Other | Admitting: Orthopaedic Surgery

## 2022-10-07 VITALS — BP 114/77 | Ht 62.0 in | Wt 135.0 lb

## 2022-10-07 DIAGNOSIS — S96911D Strain of unspecified muscle and tendon at ankle and foot level, right foot, subsequent encounter: Secondary | ICD-10-CM | POA: Diagnosis not present

## 2022-10-07 MED ORDER — HYDROCODONE-ACETAMINOPHEN 5-325 MG PO TABS: ORAL_TABLET | ORAL | 0 refills | Status: DC

## 2022-10-07 NOTE — Progress Notes (Signed)
I hurt my ankle again.  She had her CAM walker off when she was driving.  She did not put it on when she got to her destination.  She twisted her right ankle again when getting out of the car.  She has increased pain laterally of the right ankle.  She has no other trauma.  She is in the CAM walker today.  Right ankle has lateral swelling and resolving ecchymosis.  ROM is painful.  NV intact.  Encounter Diagnosis  Name Primary?   Strain of right ankle, subsequent encounter Yes   I will renew pain medicine.  I have reviewed the [place] web site prior to prescribing narcotic medicine for this patient.  Return in two weeks.  I will give an ankle lace up brace.  Call if any problem.  Precautions discussed.  Electronically Signed Sanjuana Kava, MD 2/27/20248:27 AM

## 2022-10-15 ENCOUNTER — Ambulatory Visit: Payer: Medicaid Other | Admitting: Orthopaedic Surgery

## 2022-10-16 ENCOUNTER — Ambulatory Visit (INDEPENDENT_AMBULATORY_CARE_PROVIDER_SITE_OTHER): Payer: Medicaid Other | Admitting: Orthopaedic Surgery

## 2022-10-16 ENCOUNTER — Encounter: Payer: Self-pay | Admitting: Orthopaedic Surgery

## 2022-10-16 ENCOUNTER — Other Ambulatory Visit: Payer: Self-pay | Admitting: Orthopaedic Surgery

## 2022-10-16 VITALS — BP 110/60 | HR 64

## 2022-10-16 DIAGNOSIS — S96911D Strain of unspecified muscle and tendon at ankle and foot level, right foot, subsequent encounter: Secondary | ICD-10-CM

## 2022-10-16 NOTE — Patient Instructions (Signed)
Follow up in 1 month   

## 2022-10-16 NOTE — Telephone Encounter (Signed)
She wants a call back about her medication, she stated the pharmacy doesn't have it, but they told her because the pharmacy was printed on the AVS that meant something was sent in.  She also declined to make her follow up appointment today, stated she'll call back to schedule.

## 2022-10-16 NOTE — Telephone Encounter (Signed)
Spoke with patient. Per provider he will not renew patient's pain medication. This was discussed during her visit. It is also in the notes from her visit today that she will not be provided with another prescription for pain medicine. Advised patient to take OTC tylenol and ibuprofen as needed and that she needs to wear the boot as directed by Dr.Keeling. I don't have security to refuse the prescription, so it has to be sent to the provider for refusal, however, patient is aware she will not get the refill.

## 2022-10-16 NOTE — Progress Notes (Signed)
I hurt my ankle again.  I hurt.  She says she hurt her right ankle again.  She is not wearing her CAM walker.  She asks for more pain medicine.  She complains of marked pain far above and beyond one would expect.    Right ankle has only slight minimal swelling laterally today.  She says she cannot move it but she can stand on it and move it when standing.  NV Intact.  Encounter Diagnosis  Name Primary?   Strain of right ankle, subsequent encounter Yes   She requests more pain medicine.  I told her no more narcotics.  Use Tylenol or ibuprofen, ice, elevation, CAM walker.  She needs to use the CAM walker.  She needs to elevate it or use crutches.  She may need MRI if not improved.  Return in one month.  Call if any problem.  Precautions discussed.  Electronically Signed Sanjuana Kava, MD 3/7/202410:31 AM

## 2022-10-29 ENCOUNTER — Ambulatory Visit (INDEPENDENT_AMBULATORY_CARE_PROVIDER_SITE_OTHER): Payer: Medicaid Other | Admitting: Orthopaedic Surgery

## 2022-10-29 ENCOUNTER — Encounter: Payer: Self-pay | Admitting: Orthopaedic Surgery

## 2022-10-29 ENCOUNTER — Other Ambulatory Visit (INDEPENDENT_AMBULATORY_CARE_PROVIDER_SITE_OTHER): Payer: Medicaid Other

## 2022-10-29 VITALS — BP 132/94 | HR 70

## 2022-10-29 DIAGNOSIS — M79671 Pain in right foot: Secondary | ICD-10-CM

## 2022-10-29 MED ORDER — IBUPROFEN 600 MG PO TABS: 600.0000 mg | ORAL_TABLET | Freq: Four times a day (QID) | ORAL | 5 refills | Status: DC | PRN
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
## 2022-11-14 ENCOUNTER — Encounter: Payer: Self-pay | Admitting: Emergency Medicine

## 2022-11-14 ENCOUNTER — Telehealth: Payer: Self-pay | Admitting: Physician Assistant

## 2022-11-14 ENCOUNTER — Telehealth: Payer: Self-pay

## 2022-11-14 ENCOUNTER — Ambulatory Visit
Admission: EM | Admit: 2022-11-14 | Discharge: 2022-11-14 | Disposition: A | Discharge status: Home / Self Care | Payer: Medicaid Other | Attending: Physician Assistant | Admitting: Physician Assistant

## 2022-11-14 DIAGNOSIS — J302 Other seasonal allergic rhinitis: Secondary | ICD-10-CM

## 2022-11-14 DIAGNOSIS — R051 Acute cough: Secondary | ICD-10-CM

## 2022-11-14 MED ORDER — TOLNAFTATE 1 % EX CREA: 1.0000 | TOPICAL_CREAM | Freq: Two times a day (BID) | CUTANEOUS | 0 refills | Status: DC

## 2022-11-14 MED ORDER — BENZONATATE 100 MG PO CAPS: 100.0000 mg | ORAL_CAPSULE | Freq: Three times a day (TID) | ORAL | 0 refills | Status: DC

## 2022-11-14 MED ORDER — FLUTICASONE PROPIONATE 50 MCG/ACT NA SUSP: 1.0000 | Freq: Every day | NASAL | 0 refills | Status: DC

## 2022-11-14 MED ORDER — CLOTRIMAZOLE 1 % EX CREA: TOPICAL_CREAM | CUTANEOUS | 0 refills | Status: DC

## 2022-11-14 NOTE — Telephone Encounter (Signed)
Patient called indicating that her clotrimazole that was prescribed was not affordable. Tolnaftate sent to pharmacy in the hopes of better coverage.

## 2022-11-14 NOTE — Discharge Instructions (Signed)
I think your cough is related to seasonal allergies.  Please use Flonase to help with your congestion symptoms.  Use Tessalon for cough.  I also recommend a humidifier at night and over-the-counter medications as needed.  You should consider nasal saline and sinus rinses for additional symptom relief.  Follow-up with your primary care if symptoms or not improving.  I have called in a prescription for clotrimazole in case you develop any rash.

## 2022-11-14 NOTE — Telephone Encounter (Signed)
Pt called earlier and stated that she couldn't afford the cream that was prescribed. The pharmacy said they would not cover the medication if it is over the counter meds. Provider sent in Tinactin cream. Pt was notified.
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
## 2022-11-14 NOTE — ED Triage Notes (Signed)
Mom wants treatment for ring worm.  States she doesn't have any areas yet, but states child does and she feels like she will get ring worm too.

## 2022-11-18 ENCOUNTER — Ambulatory Visit: Payer: Medicaid Other | Admitting: Orthopaedic Surgery

## 2022-12-09 ENCOUNTER — Other Ambulatory Visit: Payer: Self-pay

## 2022-12-09 ENCOUNTER — Encounter (HOSPITAL_COMMUNITY): Payer: Self-pay | Admitting: Emergency Medicine

## 2022-12-09 ENCOUNTER — Emergency Department (HOSPITAL_COMMUNITY)
Admission: EM | Admit: 2022-12-09 | Discharge: 2022-12-09 | Disposition: A | Discharge status: Home / Self Care | Payer: Medicaid Other | Attending: Emergency Medicine | Admitting: Emergency Medicine

## 2022-12-09 DIAGNOSIS — M5442 Lumbago with sciatica, left side: Secondary | ICD-10-CM | POA: Insufficient documentation

## 2022-12-09 DIAGNOSIS — M5441 Lumbago with sciatica, right side: Secondary | ICD-10-CM | POA: Diagnosis not present

## 2022-12-09 DIAGNOSIS — X58XXXA Exposure to other specified factors, initial encounter: Secondary | ICD-10-CM | POA: Diagnosis not present

## 2022-12-09 DIAGNOSIS — R519 Headache, unspecified: Secondary | ICD-10-CM | POA: Diagnosis present

## 2022-12-09 DIAGNOSIS — S060X1A Concussion with loss of consciousness of 30 minutes or less, initial encounter: Secondary | ICD-10-CM | POA: Insufficient documentation

## 2022-12-09 MED ORDER — IBUPROFEN 800 MG PO TABS: 800.0000 mg | ORAL_TABLET | Freq: Three times a day (TID) | ORAL | 0 refills | Status: DC

## 2022-12-09 MED ORDER — KETOROLAC TROMETHAMINE 30 MG/ML IJ SOLN
30.0000 mg | Freq: Once | INTRAMUSCULAR | Status: AC
  Administered 2022-12-09: 30 mg via INTRAVENOUS
  Filled 2022-12-09: qty 1

## 2022-12-09 MED ORDER — SODIUM CHLORIDE 0.9 % IV BOLUS
1000.0000 mL | Freq: Once | INTRAVENOUS | Status: AC
  Administered 2022-12-09: 1000 mL via INTRAVENOUS

## 2022-12-09 MED ORDER — MORPHINE SULFATE (PF) 4 MG/ML IV SOLN
4.0000 mg | Freq: Once | INTRAVENOUS | Status: AC
  Administered 2022-12-09: 4 mg via INTRAVENOUS
  Filled 2022-12-09: qty 1

## 2022-12-09 MED ORDER — OXYCODONE-ACETAMINOPHEN 5-325 MG PO TABS: 1.0000 | ORAL_TABLET | Freq: Four times a day (QID) | ORAL | 0 refills | Status: DC | PRN

## 2022-12-09 MED ORDER — GABAPENTIN 300 MG PO CAPS: ORAL_CAPSULE | ORAL | 0 refills | Status: DC

## 2022-12-09 MED ORDER — OXYCODONE-ACETAMINOPHEN 5-325 MG PO TABS
1.0000 | ORAL_TABLET | Freq: Once | ORAL | Status: AC
  Administered 2022-12-09: 1 via ORAL
  Filled 2022-12-09: qty 1
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
## 2022-12-09 NOTE — Discharge Instructions (Addendum)
Thank you for allowing me to be a part of your care today.   I have attached information regarding sciatica and concussion.  Please schedule a follow-up appointment with your primary care provider.    I have also provided referral information for neurology and orthopedics.  Please schedule appointments with these providers as soon as possible.   I have sent prescriptions to your pharmacy to help with your back pain.  Please take these as prescribed.  You may also use over-the-counter lidocaine patches or Voltaren (diclofenac) gel directly to your most painful areas.   Return to the ED if you experience sudden worsening of your symptoms, loss of bladder or bowel control, numbness in your groin, new weakness in your legs, inability to walk, or if you have any new concerns.

## 2022-12-11 ENCOUNTER — Other Ambulatory Visit (HOSPITAL_COMMUNITY): Payer: Self-pay | Admitting: Family Medicine

## 2022-12-11 ENCOUNTER — Encounter: Payer: Self-pay | Admitting: Neurology

## 2022-12-11 ENCOUNTER — Emergency Department
Admission: EM | Admit: 2022-12-11 | Discharge: 2022-12-11 | Disposition: A | Discharge status: Home / Self Care | Payer: Medicaid Other | Attending: Emergency Medicine | Admitting: Emergency Medicine

## 2022-12-11 ENCOUNTER — Emergency Department: Payer: Medicaid Other

## 2022-12-11 DIAGNOSIS — M5441 Lumbago with sciatica, right side: Secondary | ICD-10-CM | POA: Diagnosis not present

## 2022-12-11 DIAGNOSIS — M5442 Lumbago with sciatica, left side: Secondary | ICD-10-CM | POA: Insufficient documentation

## 2022-12-11 DIAGNOSIS — S060X1A Concussion with loss of consciousness of 30 minutes or less, initial encounter: Secondary | ICD-10-CM

## 2022-12-11 DIAGNOSIS — X58XXXA Exposure to other specified factors, initial encounter: Secondary | ICD-10-CM | POA: Diagnosis not present

## 2022-12-11 DIAGNOSIS — S0990XA Unspecified injury of head, initial encounter: Secondary | ICD-10-CM | POA: Diagnosis present

## 2022-12-11 DIAGNOSIS — S060X9A Concussion with loss of consciousness of unspecified duration, initial encounter: Secondary | ICD-10-CM

## 2022-12-11 LAB — POC URINE PREG, ED: Preg Test, Ur: NEGATIVE

## 2022-12-11 MED ORDER — OXYCODONE-ACETAMINOPHEN 5-325 MG PO TABS
1.0000 | ORAL_TABLET | Freq: Once | ORAL | Status: AC
  Administered 2022-12-11: 1 via ORAL
  Filled 2022-12-11: qty 1

## 2022-12-11 MED ORDER — METHOCARBAMOL 500 MG PO TABS: 500.0000 mg | ORAL_TABLET | Freq: Four times a day (QID) | ORAL | 0 refills | Status: DC

## 2022-12-11 MED ORDER — EXCEDRIN MIGRAINE 250-250-65 MG PO TABS: 1.0000 | ORAL_TABLET | Freq: Four times a day (QID) | ORAL | 0 refills | Status: DC | PRN

## 2022-12-11 MED ORDER — KETOROLAC TROMETHAMINE 30 MG/ML IJ SOLN
30.0000 mg | Freq: Once | INTRAMUSCULAR | Status: AC
  Administered 2022-12-11: 30 mg via INTRAMUSCULAR
  Filled 2022-12-11: qty 1

## 2022-12-11 MED ORDER — DIPHENHYDRAMINE HCL 25 MG PO CAPS
50.0000 mg | ORAL_CAPSULE | Freq: Once | ORAL | Status: DC
  Filled 2022-12-11: qty 2

## 2022-12-11 MED ORDER — OXYCODONE-ACETAMINOPHEN 5-325 MG PO TABS: 1.0000 | ORAL_TABLET | Freq: Four times a day (QID) | ORAL | 0 refills | Status: DC | PRN

## 2022-12-11 MED ORDER — METHOCARBAMOL 500 MG PO TABS
1000.0000 mg | ORAL_TABLET | Freq: Once | ORAL | Status: AC
  Administered 2022-12-11: 1000 mg via ORAL
  Filled 2022-12-11: qty 2

## 2022-12-11 MED ORDER — ALPRAZOLAM 0.5 MG PO TABS
0.5000 mg | ORAL_TABLET | Freq: Once | ORAL | Status: AC
  Administered 2022-12-11: 0.5 mg via ORAL
  Filled 2022-12-11: qty 1

## 2022-12-11 MED ORDER — KETOROLAC TROMETHAMINE 10 MG PO TABS: 10.0000 mg | ORAL_TABLET | Freq: Four times a day (QID) | ORAL | 0 refills | Status: AC

## 2022-12-11 NOTE — ED Triage Notes (Signed)
Pt presents to the ED due to lower back pain that radiates down both legs and head injury. Pt was seen her on 4/30 for similar symptoms. Pt states the pain is still there. Pt A&Ox4
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
## 2022-12-11 NOTE — ED Notes (Addendum)
See triage note. Pt states been having back pain in lower back that radiates down both legs that has been going for a couple of weeks. States she was recently seen at [facility] and they told her she has a slipped disc. Also states she recently had a concussion from boxing at a gym. States no falls have occurred or anything to trigger the back pain.

## 2022-12-19 ENCOUNTER — Ambulatory Visit: Payer: Medicaid Other

## 2022-12-20 ENCOUNTER — Emergency Department (HOSPITAL_COMMUNITY): Payer: Medicaid Other

## 2022-12-20 ENCOUNTER — Encounter (HOSPITAL_COMMUNITY): Payer: Self-pay | Admitting: Emergency Medicine

## 2022-12-20 ENCOUNTER — Emergency Department (HOSPITAL_COMMUNITY)
Admission: EM | Admit: 2022-12-20 | Discharge: 2022-12-21 | Disposition: A | Discharge status: Home / Self Care | Payer: Medicaid Other | Attending: Emergency Medicine | Admitting: Emergency Medicine

## 2022-12-20 ENCOUNTER — Other Ambulatory Visit: Payer: Self-pay

## 2022-12-20 DIAGNOSIS — R079 Chest pain, unspecified: Secondary | ICD-10-CM | POA: Diagnosis not present

## 2022-12-20 DIAGNOSIS — M546 Pain in thoracic spine: Secondary | ICD-10-CM | POA: Diagnosis not present

## 2022-12-20 DIAGNOSIS — R1011 Right upper quadrant pain: Secondary | ICD-10-CM | POA: Diagnosis present

## 2022-12-20 DIAGNOSIS — Z7982 Long term (current) use of aspirin: Secondary | ICD-10-CM | POA: Diagnosis not present

## 2022-12-20 DIAGNOSIS — R109 Unspecified abdominal pain: Secondary | ICD-10-CM

## 2022-12-20 DIAGNOSIS — R112 Nausea with vomiting, unspecified: Secondary | ICD-10-CM | POA: Diagnosis not present

## 2022-12-20 LAB — CBC WITH DIFFERENTIAL/PLATELET
Abs Immature Granulocytes: 0.03 10*3/uL (ref 0.00–0.07)
Basophils Absolute: 0 10*3/uL (ref 0.0–0.1)
Basophils Relative: 0 %
Eosinophils Absolute: 0.1 10*3/uL (ref 0.0–0.5)
Eosinophils Relative: 1 %
HCT: 39 % (ref 36.0–46.0)
Hemoglobin: 13.4 g/dL (ref 12.0–15.0)
Immature Granulocytes: 0 %
Lymphocytes Relative: 26 %
Lymphs Abs: 2.6 10*3/uL (ref 0.7–4.0)
MCH: 29.6 pg (ref 26.0–34.0)
MCHC: 34.4 g/dL (ref 30.0–36.0)
MCV: 86.3 fL (ref 80.0–100.0)
Monocytes Absolute: 0.6 10*3/uL (ref 0.1–1.0)
Monocytes Relative: 6 %
Neutro Abs: 6.8 10*3/uL (ref 1.7–7.7)
Neutrophils Relative %: 67 %
Platelets: 288 10*3/uL (ref 150–400)
RBC: 4.52 MIL/uL (ref 3.87–5.11)
RDW: 12.9 % (ref 11.5–15.5)
WBC: 10 10*3/uL (ref 4.0–10.5)
nRBC: 0 % (ref 0.0–0.2)

## 2022-12-20 LAB — URINALYSIS, MICROSCOPIC (REFLEX)

## 2022-12-20 LAB — COMPREHENSIVE METABOLIC PANEL
ALT: 12 U/L (ref 0–44)
AST: 14 U/L — ABNORMAL LOW (ref 15–41)
Albumin: 3.6 g/dL (ref 3.5–5.0)
Alkaline Phosphatase: 30 U/L — ABNORMAL LOW (ref 38–126)
Anion gap: 9 (ref 5–15)
BUN: 15 mg/dL (ref 6–20)
CO2: 21 mmol/L — ABNORMAL LOW (ref 22–32)
Calcium: 8.8 mg/dL — ABNORMAL LOW (ref 8.9–10.3)
Chloride: 107 mmol/L (ref 98–111)
Creatinine, Ser: 0.87 mg/dL (ref 0.44–1.00)
GFR, Estimated: >60 mL/min (ref >60)
Glucose, Bld: 97 mg/dL (ref 70–99)
Potassium: 3.1 mmol/L — ABNORMAL LOW (ref 3.5–5.1)
Sodium: 137 mmol/L (ref 135–145)
Total Bilirubin: 0.5 mg/dL (ref 0.3–1.2)
Total Protein: 6.1 g/dL — ABNORMAL LOW (ref 6.5–8.1)

## 2022-12-20 LAB — URINALYSIS, ROUTINE W REFLEX MICROSCOPIC
Bilirubin Urine: NEGATIVE
Glucose, UA: NEGATIVE mg/dL
Ketones, ur: NEGATIVE mg/dL
Nitrite: NEGATIVE
Protein, ur: NEGATIVE mg/dL
Specific Gravity, Urine: 1.015 (ref 1.005–1.030)
pH: 7 (ref 5.0–8.0)

## 2022-12-20 LAB — LIPASE, BLOOD: Lipase: 56 U/L — ABNORMAL HIGH (ref 11–51)

## 2022-12-20 LAB — PREGNANCY, URINE: Preg Test, Ur: NEGATIVE

## 2022-12-20 MED ORDER — FENTANYL CITRATE PF 50 MCG/ML IJ SOSY
50.0000 ug | PREFILLED_SYRINGE | Freq: Once | INTRAMUSCULAR | Status: AC
  Administered 2022-12-20: 50 ug via INTRAVENOUS
  Filled 2022-12-20: qty 1

## 2022-12-20 MED ORDER — PROCHLORPERAZINE EDISYLATE 10 MG/2ML IJ SOLN
10.0000 mg | Freq: Once | INTRAMUSCULAR | Status: AC
  Administered 2022-12-20: 10 mg via INTRAVENOUS
  Filled 2022-12-20: qty 2

## 2022-12-20 MED ORDER — SODIUM CHLORIDE 0.9 % IV BOLUS
1000.0000 mL | Freq: Once | INTRAVENOUS | Status: AC
  Administered 2022-12-20: 1000 mL via INTRAVENOUS

## 2022-12-20 MED ORDER — MORPHINE SULFATE (PF) 4 MG/ML IV SOLN
4.0000 mg | Freq: Once | INTRAVENOUS | Status: AC
  Administered 2022-12-20: 4 mg via INTRAVENOUS
  Filled 2022-12-20: qty 1

## 2022-12-20 MED ORDER — ONDANSETRON HCL 4 MG/2ML IJ SOLN
4.0000 mg | Freq: Once | INTRAMUSCULAR | Status: AC
  Administered 2022-12-20: 4 mg via INTRAVENOUS
  Filled 2022-12-20: qty 2

## 2022-12-20 MED ORDER — IOHEXOL 300 MG/ML  SOLN
100.0000 mL | Freq: Once | INTRAMUSCULAR | Status: AC | PRN
  Administered 2022-12-20: 100 mL via INTRAVENOUS

## 2022-12-20 NOTE — ED Notes (Signed)
Patient transported to X-ray 

## 2022-12-20 NOTE — ED Triage Notes (Signed)
Pt via EMS c/o right rib pain x 3 days worse with pressure. Vitals WNL, NSR noted. Lungs clear bilaterally. Denies n/v/cough, no known injury.

## 2022-12-20 NOTE — ED Provider Notes (Signed)
[facility] EMERGENCY DEPARTMENT AT [facility] Provider Note   CSN: [id number] Arrival date & time: 12/20/22  2034     History  Chief Complaint  Patient presents with   Chest Pain    Jessica [name] is a 32 y.o. female.  Reports history of anxiety, ADHD, migraine and chronic back pain.  Presents here for right upper quadrant and right upper back pain for the past 3 days with nausea and vomiting.  She has not been able to eat.  No fevers or chills.  Denies urinary complaints.  No shortness of breath.  Pain is nonpleuritic.   Chest Pain      Home Medications Prior to Admission medications   Medication Sig Start Date End Date Taking? Authorizing Provider  ALPRAZolam Prudy Feeler) 0.5 MG tablet Take 0.5 mg by mouth 3 (three) times daily as needed for anxiety.    [provider]  aspirin-acetaminophen-caffeine (EXCEDRIN MIGRAINE) [phone] MG tablet Take 1 tablet by mouth every 6 (six) hours as needed for headache. 12/11/22   Cuthriell, Delorise Royals, PA-C  benzonatate (TESSALON) 100 MG capsule Take 1 capsule (100 mg total) by mouth every 8 (eight) hours. Patient not taking: Reported on 12/09/2022 11/14/22   Raspet, Noberto Retort, PA-C  clotrimazole (LOTRIMIN) 1 % cream Apply to affected area 2 times daily Patient not taking: Reported on 12/09/2022 11/14/22   Raspet, Erin K, PA-C  fluticasone (FLONASE) 50 MCG/ACT nasal spray Place 1 spray into both nostrils daily. 11/14/22   Raspet, Noberto Retort, PA-C  gabapentin (NEURONTIN) 300 MG capsule Take 1 capsule (300 mg total) by mouth at bedtime for 1 day, THEN 1 capsule (300 mg total) 2 (two) times daily for 2 days, THEN 1 capsule (300 mg total) 3 (three) times daily for 7 days. 12/09/22 12/19/22  Clark, Meghan R, PA-C  ibuprofen (ADVIL) 800 MG tablet Take 1 tablet (800 mg total) by mouth 3 (three) times daily. 12/09/22   Clark, Meghan R, PA-C  methocarbamol (ROBAXIN) 500 MG tablet Take 1 tablet (500 mg total) by mouth 4 (four) times daily. 12/11/22    Cuthriell, Delorise Royals, PA-C  oxyCODONE-acetaminophen (PERCOCET/ROXICET) 5-325 MG tablet Take 1-2 tablets by mouth every 6 (six) hours as needed for severe pain. 12/09/22   Clark, Meghan R, PA-C  oxyCODONE-acetaminophen (PERCOCET/ROXICET) 5-325 MG tablet Take 1 tablet by mouth every 6 (six) hours as needed for severe pain. 12/11/22   Cuthriell, Delorise Royals, PA-C  tolnaftate (TINACTIN) 1 % cream Apply 1 Application topically 2 (two) times daily. 11/14/22   Raspet, Noberto Retort, PA-C      Allergies    Atomoxetine, Atarax [hydroxyzine], and Trazodone and nefazodone    Review of Systems   Review of Systems  Cardiovascular:  Positive for chest pain.    Physical Exam Updated Vital Signs BP 112/70 (BP Location: Left Arm)   Pulse (!) 55   Temp 98.5 F (36.9 C) (Oral)   Resp 19   Ht 5\' 2"  (1.575 m)   Wt 63.5 kg   LMP 12/19/2022 (Exact Date)   SpO2 100%   BMI 25.61 kg/m  Physical Exam Vitals and nursing note reviewed.  Constitutional:      General: She is not in acute distress.    Appearance: She is well-developed.  HENT:     Head: Normocephalic and atraumatic.  Eyes:     Conjunctiva/sclera: Conjunctivae normal.  Cardiovascular:     Rate and Rhythm: Normal rate and regular rhythm.     Heart sounds:  No murmur heard. Pulmonary:     Effort: Pulmonary effort is normal. No respiratory distress.     Breath sounds: Normal breath sounds.  Abdominal:     Palpations: Abdomen is soft.     Tenderness: There is abdominal tenderness in the right upper quadrant. Positive signs include Murphy's sign.  Musculoskeletal:        General: No swelling. Normal range of motion.     Cervical back: Neck supple.  Skin:    General: Skin is warm and dry.     Capillary Refill: Capillary refill takes less than 2 seconds.  Neurological:     Mental Status: She is alert.  Psychiatric:        Mood and Affect: Mood normal.    ED Results / Procedures / Treatments   Labs (all labs ordered are listed, but only abnormal  results are displayed) Labs Reviewed  COMPREHENSIVE METABOLIC PANEL - Abnormal; Notable for the following components:      Result Value   Potassium 3.1 (*)    CO2 21 (*)    Calcium 8.8 (*)    Total Protein 6.1 (*)    AST 14 (*)    Alkaline Phosphatase 30 (*)    All other components within normal limits  LIPASE, BLOOD - Abnormal; Notable for the following components:   Lipase 56 (*)    All other components within normal limits  CBC WITH DIFFERENTIAL/PLATELET  URINALYSIS, ROUTINE W REFLEX MICROSCOPIC  PREGNANCY, URINE  POC URINE PREG, ED    EKG None  Radiology DG Chest 2 View  Result Date: 12/20/2022 CLINICAL DATA:  Which worsening chest pain. Sharp right-sided chest pain. EXAM: CHEST - 2 VIEW COMPARISON:  Chest radiograph 09/01/2021 FINDINGS: The cardiomediastinal contours are within normal limits. The lungs are clear. No pneumothorax or pleural effusion. No acute finding in the visualized skeleton. IMPRESSION: No active cardiopulmonary disease. Electronically Signed   By: Emmaline Kluver M.D.   On: 12/20/2022 21:08    Procedures Procedures    Medications Ordered in ED Medications  morphine (PF) 4 MG/ML injection 4 mg (4 mg Intravenous Given 12/20/22 2210)  ondansetron (ZOFRAN) injection 4 mg (4 mg Intravenous Given 12/20/22 2209)  sodium chloride 0.9 % bolus 1,000 mL (1,000 mLs Intravenous New Bag/Given 12/20/22 2209)    ED Course/ Medical Decision Making/ A&P                             Medical Decision Making Ddx: gastritis, gastroenteritis, appendicitis, cholecystitis, cholelithiasis, diverticulitis, DKA, nephrolithiasis, gastroparesis, pancreatitis, other  ED course: Patient presents with right upper quadrant pain with nausea and vomiting x 3 days.  She is uncomfortable, has significant right upper quadrant tenderness, does not have history of biliary disease, still has her gallbladder.  Suspect possible cholecystitis versus cholelithiasis.  She is given antiemetics,  fluids, pain medication, pending CT scan at this time. Signed out at this timeto Dr. Bernette Mayers.    Amount and/or Complexity of Data Reviewed Labs: ordered. Radiology: ordered.  Risk Prescription drug management.           Final Clinical Impression(s) / ED Diagnoses Final diagnoses:  None    Rx / DC Orders ED Discharge Orders     None         Josem Kaufmann 12/20/22 2318    Bethann Berkshire, MD 12/21/22 1611

## 2022-12-20 NOTE — ED Notes (Signed)
Patient transported to CT 

## 2022-12-21 MED ORDER — PROCHLORPERAZINE MALEATE 10 MG PO TABS: 10.0000 mg | ORAL_TABLET | Freq: Two times a day (BID) | ORAL | 0 refills | Status: DC | PRN

## 2022-12-21 MED ORDER — DICYCLOMINE HCL 20 MG PO TABS: 20.0000 mg | ORAL_TABLET | Freq: Two times a day (BID) | ORAL | 0 refills | Status: DC

## 2022-12-21 MED ORDER — DICYCLOMINE HCL 10 MG PO CAPS
10.0000 mg | ORAL_CAPSULE | Freq: Once | ORAL | Status: AC
  Administered 2022-12-21: 10 mg via ORAL
  Filled 2022-12-21: qty 1

## 2022-12-21 NOTE — ED Provider Notes (Signed)
Care of the patient assumed at the change of shift. Here for upper abdominal pain, pending CT to eval gall bladder which was negative for acute process. She is aware of incidental renal stone. She is feeling better, requesting to eat. Plan discharge with Rx for bentyl, compazine as needed. PCP follow up, RTED for any other concerns.     Pollyann Savoy, MD 12/21/22 [phone]

## 2023-02-10 ENCOUNTER — Emergency Department
Admission: EM | Admit: 2023-02-10 | Discharge: 2023-02-10 | Disposition: A | Discharge status: Home / Self Care | Payer: Medicaid Other | Attending: Emergency Medicine | Admitting: Emergency Medicine

## 2023-02-10 ENCOUNTER — Other Ambulatory Visit: Payer: Self-pay

## 2023-02-10 DIAGNOSIS — M545 Low back pain, unspecified: Secondary | ICD-10-CM | POA: Diagnosis present

## 2023-02-10 LAB — URINALYSIS, ROUTINE W REFLEX MICROSCOPIC
Bilirubin Urine: NEGATIVE
Glucose, UA: NEGATIVE mg/dL
Ketones, ur: NEGATIVE mg/dL
Nitrite: NEGATIVE
Protein, ur: NEGATIVE mg/dL
Specific Gravity, Urine: 1.02 (ref 1.005–1.030)
pH: 5 (ref 5.0–8.0)

## 2023-02-10 LAB — POC URINE PREG, ED: Preg Test, Ur: NEGATIVE

## 2023-02-10 MED ORDER — LIDOCAINE 5 % EX PTCH
1.0000 | MEDICATED_PATCH | Freq: Once | CUTANEOUS | Status: DC
  Administered 2023-02-10: 1 via TRANSDERMAL
  Filled 2023-02-10: qty 1

## 2023-02-10 MED ORDER — CYCLOBENZAPRINE HCL 10 MG PO TABS
10.0000 mg | ORAL_TABLET | Freq: Once | ORAL | Status: AC
  Administered 2023-02-10: 10 mg via ORAL
  Filled 2023-02-10: qty 1

## 2023-02-10 NOTE — ED Provider Notes (Signed)
[facility] Emergency Department Provider Note     Event Date/Time   First MD Initiated Contact with Patient 02/10/23 1521     (approximate)   History   Back Pain   HPI  Jessica [name] is a 32 y.o. female with a non-contributory medical history presents to the ED for evaluation of lower back pain. She reports intermittent symptoms for the last several weeks. She denies any preceeding trauma or falls. She does endorse lifting a couch yesterday, which increased her symptoms.  Physical Exam   Triage Vital Signs: ED Triage Vitals [02/10/23 1443]  Enc Vitals Group     BP (!) 151/94     Pulse Rate 65     Resp 18     Temp 97.9 F (36.6 C)     Temp Source Oral     SpO2 100 %     Weight 139 lb (63 kg)     Height 5\' 1"  (1.549 m)     Head Circumference      Peak Flow      Pain Score 8     Pain Loc      Pain Edu?      Excl. in GC?     Most recent vital signs: Vitals:   02/10/23 1443  BP: (!) 151/94  Pulse: 65  Resp: 18  Temp: 97.9 F (36.6 C)  SpO2: 100%    General Awake, no distress. NAD CV:  Good peripheral perfusion.  RESP:  Normal effort.  ABD:  No distention.  MSK:  Normal spinal alignment without significant midline tenderness, spasm, deformity, or step-off. Normal sit to stand transition. Normal hip flexion. Normal lumbar extension/flexion.  NEURO: CN II-XII grossly intact. Normal LE DTRs bilaterally. Normal toe/heel raise. Normal gait without ataxia.    ED Results / Procedures / Treatments   Labs (all labs ordered are listed, but only abnormal results are displayed) Labs Reviewed  URINALYSIS, ROUTINE W REFLEX MICROSCOPIC - Abnormal; Notable for the following components:      Result Value   Color, Urine YELLOW (*)    APPearance HAZY (*)    Hgb urine dipstick LARGE (*)    Leukocytes,Ua SMALL (*)    Bacteria, UA RARE (*)    All other components within normal limits  POC URINE PREG, ED     EKG  RADIOLOGY  No results  found.   PROCEDURES:  Critical Care performed: No  Procedures   MEDICATIONS ORDERED IN ED: Medications  lidocaine (LIDODERM) 5 % 1 patch (1 patch Transdermal Patch Applied 02/10/23 1549)  cyclobenzaprine (FLEXERIL) tablet 10 mg (10 mg Oral Given 02/10/23 1549)     IMPRESSION / MDM / ASSESSMENT AND PLAN / ED COURSE  I reviewed the triage vital signs and the nursing notes.                              Differential diagnosis includes, but is not limited to, lumbar strain, lumbar radiculopathy, UTI/pyelonephritis  Patient's presentation is most consistent with acute complicated illness / injury requiring diagnostic workup.  Patient's diagnosis is consistent with lumbar muscle strain.   Patient left the ED after evaluation and administration of PO meds. She walked out under her own power without difficulty.     FINAL CLINICAL IMPRESSION(S) / ED DIAGNOSES   Final diagnoses:  Acute midline low back pain without sciatica     Rx / DC Orders  ED Discharge Orders     None        Note:  This document was prepared using Dragon voice recognition software and may include unintentional dictation errors.    Lissa Hoard, PA-C 02/10/23 1620    Jene Every, MD 02/10/23 [phone]

## 2023-02-10 NOTE — ED Triage Notes (Signed)
Pt sts that she has been having back pain for the last several weeks. Pt sts that she lifted a couch yesterday and now it is hurting really bad.

## 2023-03-05 ENCOUNTER — Emergency Department (HOSPITAL_COMMUNITY)
Admission: EM | Admit: 2023-03-05 | Discharge: 2023-03-05 | Disposition: A | Discharge status: Home / Self Care | Payer: Medicaid Other | Attending: Emergency Medicine | Admitting: Emergency Medicine

## 2023-03-05 ENCOUNTER — Other Ambulatory Visit: Payer: Self-pay

## 2023-03-05 DIAGNOSIS — M5430 Sciatica, unspecified side: Secondary | ICD-10-CM | POA: Insufficient documentation

## 2023-03-05 DIAGNOSIS — M545 Low back pain, unspecified: Secondary | ICD-10-CM | POA: Diagnosis present

## 2023-03-05 DIAGNOSIS — Z87891 Personal history of nicotine dependence: Secondary | ICD-10-CM | POA: Insufficient documentation

## 2023-03-05 MED ORDER — PREDNISONE 20 MG PO TABS: 40.0000 mg | ORAL_TABLET | Freq: Every day | ORAL | 0 refills | Status: AC

## 2023-03-05 MED ORDER — NAPROXEN 250 MG PO TABS
500.0000 mg | ORAL_TABLET | Freq: Once | ORAL | Status: AC
  Administered 2023-03-05: 500 mg via ORAL
  Filled 2023-03-05: qty 2

## 2023-03-05 MED ORDER — PREDNISONE 50 MG PO TABS
60.0000 mg | ORAL_TABLET | Freq: Once | ORAL | Status: AC
  Administered 2023-03-05: 60 mg via ORAL
  Filled 2023-03-05: qty 1

## 2023-03-05 NOTE — Discharge Instructions (Signed)
You were evaluated in the Emergency Department and after careful evaluation, we did not find any emergent condition requiring admission or further testing in the hospital.  Your exam/testing today is overall reassuring.  Symptoms seem to be due to sciatica.  Take the prednisone anti-inflammatory steroid medication as directed.  Take your first dose morning of 7-26.  Please return to the Emergency Department if you experience any worsening of your condition.   Thank you for allowing [place] to be a part of your care.

## 2023-03-05 NOTE — ED Notes (Signed)
ED Provider at bedside. 

## 2023-03-05 NOTE — ED Triage Notes (Signed)
Py c/o right lower back pain that radiates down leg. Has been seen multiple times in past for same. Hx of sciatica. Ambulatory in triage

## 2023-03-05 NOTE — ED Provider Notes (Signed)
[facility] Emergency Department Provider Note MRN:  [id number]  Arrival date & time: 03/05/23     Chief Complaint   Back Pain   History of Present Illness   Jessica [name] is a 32 y.o. year-old female with a history of depression presenting to the ED with chief complaint of back pain.  Low back pain with radiation down the back of the legs, right greater than left.  Has happened multiple times in the past.  Denies numbness or weakness to the arms or legs, no bowel or bladder dysfunction.  Review of Systems  A thorough review of systems was obtained and all systems are negative except as noted in the HPI and PMH.   Patient's Health History    Past Medical History:  Diagnosis Date   Abnormal Pap smear    ADHD (attention deficit hyperactivity disorder)    Anxiety    Chronic back pain    Depression    Migraine headache    UTI (lower urinary tract infection) 01/12/2013   Vaginal Pap smear, abnormal     Past Surgical History:  Procedure Laterality Date   CESAREAN SECTION N/A 09/08/2013   Procedure: CESAREAN SECTION;  Surgeon: Tereso Newcomer, MD;  Location: [facility];  Service: Obstetrics;  Laterality: N/A;   CESAREAN SECTION N/A 02/10/2018   Procedure: REPEAT CESAREAN SECTION;  Surgeon: Federico Flake, MD;  Location: [facility]IRTH[facility] SUIT[facility];  Service: Obstetrics;  Laterality: N/A;   TOOTH EXTRACTION     TUBAL LIGATION Bilateral 04/08/2018   Procedure: LAPAROSCOPIC BILATERAL TUBAL STERILIZATION WITH FALLOPE RING APPLICATION;  Surgeon: Tilda Burrow, MD;  Location: [facility];  Service: Gynecology;  Laterality: Bilateral;    Family History  Problem Relation Age of Onset   Drug abuse Mother     Social History   Socioeconomic History   Marital status: Single    Spouse name: Not on file   Number of children: 2   Years of education: Not on file   Highest education level: Not on file  Occupational History   Not on file  Tobacco Use   Smoking status:  Former    Current packs/day: 1.00    Types: Cigarettes   Smokeless tobacco: Never  Vaping Use   Vaping status: Never Used  Substance and Sexual Activity   Alcohol use: Not Currently   Drug use: No   Sexual activity: Not Currently    Birth control/protection: Surgical    Comment: tubal  Other Topics Concern   Not on file  Social History Narrative   ** Merged History Encounter **       ** Merged History Encounter **       Social Determinants of Health   Financial Resource Strain: Not on file  Food Insecurity: Not on file  Transportation Needs: Not on file  Physical Activity: Not on file  Stress: Not on file  Social Connections: Not on file  Intimate Partner Violence: Not on file     Physical Exam   Vitals:   03/05/23 0443  BP: 121/70  Pulse: 69  Resp: 18  Temp: 97.8 F (36.6 C)  SpO2: 98%    CONSTITUTIONAL: Well-appearing, NAD NEURO/PSYCH:  Alert and oriented x 3, normal and symmetric strength and sensation, normal coordination, normal speech EYES:  eyes equal and reactive ENT/NECK:  no LAD, no JVD CARDIO: Regular rate, well-perfused, normal S1 and S2 PULM:  CTAB no wheezing or rhonchi GI/GU:  non-distended, non-tender MSK/SPINE:  No gross  deformities, no edema SKIN:  no rash, atraumatic   *Additional and/or pertinent findings included in MDM below  Diagnostic and Interventional Summary    EKG Interpretation Date/Time:    Ventricular Rate:    PR Interval:    QRS Duration:    QT Interval:    QTC Calculation:   R Axis:      Text Interpretation:         Labs Reviewed - No data to display  No orders to display    Medications  predniSONE (DELTASONE) tablet 60 mg (60 mg Oral Given 03/05/23 0513)  naproxen (NAPROSYN) tablet 500 mg (500 mg Oral Given 03/05/23 0513)     Procedures  /  Critical Care Procedures  ED Course and Medical Decision Making  Initial Impression and Ddx Sciatica based on history and exam with no signs or symptoms of  myelopathy.  Past medical/surgical history that increases complexity of ED encounter: None  Interpretation of Diagnostics Laboratory and/or imaging options to aid in the diagnosis/care of the patient were considered.  After careful history and physical examination, it was determined that there was no indication for diagnostics at this time.  Patient Reassessment and Ultimate Disposition/Management     Discharge  Patient management required discussion with the following services or consulting groups:  None  Complexity of Problems Addressed Acute complicated illness or Injury  Additional Data Reviewed and Analyzed Further history obtained from: None  Additional Factors Impacting ED Encounter Risk Prescriptions  Elmer Sow. Pilar Plate, MD [facility] Emergency Medicine [facility] [email or web address]  Final Clinical Impressions(s) / ED Diagnoses     ICD-10-CM   1. Sciatica, unspecified laterality  M54.30       ED Discharge Orders          Ordered    predniSONE (DELTASONE) 20 MG tablet  Daily        03/05/23 0521             Discharge Instructions Discussed with and Provided to Patient:    Discharge Instructions      You were evaluated in the Emergency Department and after careful evaluation, we did not find any emergent condition requiring admission or further testing in the hospital.  Your exam/testing today is overall reassuring.  Symptoms seem to be due to sciatica.  Take the prednisone anti-inflammatory steroid medication as directed.  Take your first dose morning of 7-26.  Please return to the Emergency Department if you experience any worsening of your condition.   Thank you for allowing [place] to be a part of your care.      Sabas Sous, MD 03/05/23 [phone]

## 2023-03-12 ENCOUNTER — Ambulatory Visit
Admission: EM | Admit: 2023-03-12 | Discharge: 2023-03-12 | Disposition: A | Discharge status: Home / Self Care | Payer: Medicaid Other | Attending: Family Medicine | Admitting: Family Medicine

## 2023-03-12 DIAGNOSIS — R051 Acute cough: Secondary | ICD-10-CM | POA: Diagnosis not present

## 2023-03-12 MED ORDER — PREDNISONE 20 MG PO TABS: 40.0000 mg | ORAL_TABLET | Freq: Every day | ORAL | 0 refills | Status: DC

## 2023-03-12 MED ORDER — GUAIFENESIN-CODEINE 100-10 MG/5ML PO SOLN: 5.0000 mL | Freq: Every evening | ORAL | 0 refills | Status: DC | PRN

## 2023-03-12 MED ORDER — ALBUTEROL SULFATE HFA 108 (90 BASE) MCG/ACT IN AERS: 2.0000 | INHALATION_SPRAY | RESPIRATORY_TRACT | 0 refills | Status: DC | PRN

## 2023-03-12 NOTE — ED Triage Notes (Signed)
Pt reports she has been coughing really bad x 1 day. Sore throat from coughing
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
## 2023-03-19 ENCOUNTER — Ambulatory Visit (INDEPENDENT_AMBULATORY_CARE_PROVIDER_SITE_OTHER): Payer: Medicaid Other | Admitting: Adult Health

## 2023-03-19 ENCOUNTER — Ambulatory Visit (HOSPITAL_COMMUNITY): Payer: Medicaid Other | Admitting: Clinical

## 2023-03-19 ENCOUNTER — Other Ambulatory Visit (HOSPITAL_COMMUNITY)
Admission: RE | Admit: 2023-03-19 | Discharge: 2023-03-19 | Disposition: A | Discharge status: Home / Self Care | Payer: Medicaid Other | Source: Ambulatory Visit | Attending: Adult Health | Admitting: Adult Health

## 2023-03-19 ENCOUNTER — Encounter: Payer: Self-pay | Admitting: Adult Health

## 2023-03-19 VITALS — BP 129/88 | HR 76 | Ht 62.0 in | Wt 146.0 lb

## 2023-03-19 DIAGNOSIS — Z3202 Encounter for pregnancy test, result negative: Secondary | ICD-10-CM | POA: Diagnosis not present

## 2023-03-19 DIAGNOSIS — F32A Depression, unspecified: Secondary | ICD-10-CM

## 2023-03-19 DIAGNOSIS — F419 Anxiety disorder, unspecified: Secondary | ICD-10-CM | POA: Diagnosis not present

## 2023-03-19 DIAGNOSIS — Z124 Encounter for screening for malignant neoplasm of cervix: Secondary | ICD-10-CM

## 2023-03-19 DIAGNOSIS — N921 Excessive and frequent menstruation with irregular cycle: Secondary | ICD-10-CM

## 2023-03-19 DIAGNOSIS — N946 Dysmenorrhea, unspecified: Secondary | ICD-10-CM | POA: Insufficient documentation

## 2023-03-19 HISTORY — DX: Depression, unspecified: F32.A

## 2023-03-19 LAB — POCT URINE PREGNANCY: Preg Test, Ur: NEGATIVE

## 2023-03-19 MED ORDER — PAROXETINE HCL 10 MG PO TABS: 10.0000 mg | ORAL_TABLET | Freq: Every day | ORAL | 2 refills | Status: AC

## 2023-03-19 NOTE — Progress Notes (Signed)
Subjective:     Patient ID: [name], female   DOB: [birth year], 32 y.o.   MRN: [id number]  HPI Alichia is a 32 year old white female, single with SO, G2P2002, in complaining of painful periods that are heavy at times, and feels anxious and overwhelmed. She needs a pap too. She has taken meds in past for anxiety and depression.  She smokes.   Review of Systems Has painful heavy periods, not regular Feels overwhelmed and anxious Reviewed past medical,surgical, social and family history. Reviewed medications and allergies.     Objective:   Physical Exam BP 129/88 (BP Location: Right Arm, Patient Position: Sitting, Cuff Size: Normal)   Pulse 76   Ht 5\' 2"  (1.575 m)   Wt 146 lb (66.2 kg)   BMI 26.70 kg/m  UPT is negative  Skin warm and dry.Pelvic: external genitalia is normal in appearance no lesions, vagina: pink,urethra has no lesions or masses noted, cervix:smooth and bulbous,pap with GC/CHL and HR HPV genotyping performed, uterus: normal size, shape and contour, mildly tender, no masses felt, adnexa: no masses or tenderness noted. Bladder is non tender and no masses felt.    Fall risk is low    03/19/2023    2:21 PM 03/26/2020   11:25 AM 11/17/2019   10:39 AM  Depression screen PHQ 2/9  Decreased Interest 0 1 0  Down, Depressed, Hopeless 3 0 3  PHQ - 2 Score 3 1 3   Altered sleeping 3 1 2   Tired, decreased energy 3 3 3   Change in appetite 3 1 1   Feeling bad or failure about yourself  3 0 0  Trouble concentrating 3 3 3   Moving slowly or fidgety/restless 3 0 0  Suicidal thoughts 0 0 0  PHQ-9 Score 21 9 12   Difficult doing work/chores  Not difficult at all Very difficult       03/19/2023    2:23 PM 03/26/2020   11:27 AM  GAD 7 : Generalized Anxiety Score  Nervous, Anxious, on Edge 3 3  Control/stop worrying 3 1  Worry too much - different things 3 2  Trouble relaxing 3 1  Restless 3 1  Easily annoyed or irritable 3 1  Afraid - awful might happen 0 0  Total GAD 7 Score 18  9  Anxiety Difficulty  Somewhat difficult    Upstream - 03/19/23 1421       Pregnancy Intention Screening   Does the patient want to become pregnant in the next year? N/A    Does the patient's partner want to become pregnant in the next year? N/A    Would the patient like to discuss contraceptive options today? N/A      Contraception Wrap Up   Current Method Female Sterilization    End Method Female Sterilization            Examination chaperoned by Malachy Mood LPN    Assessment:     1. Pregnancy examination or test, negative result - POCT urine pregnancy  2. Routine Papanicolaou smear Pap sent Pap in 3 years if normal  - Cytology - PAP( [facility])  3. Dysmenorrhea +painful periods, OTC meds has not helped, nor has tramodol - US PELVIC COMPLETE WITH TRANSVAGINAL; Future  4. Menorrhagia with irregular cycle Periods heavy at times  Will check labs  Scheduled pelvic US for 03/20/23 at 3:30 pm at [facility] to assess uterus and ovaries  - US PELVIC COMPLETE WITH TRANSVAGINAL; Future - TSH + free  T4 - CBC - Comprehensive metabolic panel  5. Anxiety and depression She has taken meds in the past  Will rx paxil 10 mg 1 daily and refer to [facility] She wanted xanax but I would not rx that.  Meds ordered this encounter  Medications   PARoxetine (PAXIL) 10 MG tablet    Sig: Take 1 tablet (10 mg total) by mouth daily.    Dispense:  30 tablet    Refill:  2    Order Specific Question:   Supervising Provider    Answer:   [name] [[id number]]    - Ambulatory referral to [facility]     Plan:     Follow up in 6 weeks for ROS on starting paxil

## 2023-03-20 ENCOUNTER — Ambulatory Visit (HOSPITAL_COMMUNITY)
Admission: RE | Admit: 2023-03-20 | Discharge: 2023-03-20 | Disposition: A | Discharge status: Home / Self Care | Payer: Medicaid Other | Source: Ambulatory Visit | Attending: Adult Health | Admitting: Adult Health

## 2023-03-20 DIAGNOSIS — N921 Excessive and frequent menstruation with irregular cycle: Secondary | ICD-10-CM

## 2023-03-20 DIAGNOSIS — N946 Dysmenorrhea, unspecified: Secondary | ICD-10-CM

## 2023-03-23 ENCOUNTER — Telehealth: Payer: Self-pay | Admitting: Clinical

## 2023-03-23 NOTE — Telephone Encounter (Signed)
Attempt call regarding referral; Left HIPPA-compliant message to call back Jamie from [facility] at  [phone] (Jamie's office).    

## 2023-03-24 ENCOUNTER — Telehealth: Payer: Self-pay | Admitting: *Deleted

## 2023-03-24 NOTE — Telephone Encounter (Signed)
Pt aware [place] was normal. Pt voiced understanding. [place]

## 2023-03-24 NOTE — Telephone Encounter (Signed)
Left message @ 9:57 am. JSY

## 2023-03-24 NOTE — Telephone Encounter (Signed)
-----   Message from Cyril Mourning sent at 03/24/2023  9:20 AM EDT ----- Let her know [place] normal

## 2023-03-30 ENCOUNTER — Telehealth: Payer: Self-pay

## 2023-03-30 NOTE — Telephone Encounter (Signed)
Pt aware pap was negative for malignancy, HPV and GC/CHL. Pt voiced understanding. [place]

## 2023-03-30 NOTE — Telephone Encounter (Signed)
Voice mail not set up @ 3:10 pm. JSY

## 2023-03-30 NOTE — Telephone Encounter (Signed)
Patient would like for someone to call her and let her know her test results from her pap.

## 2023-04-02 ENCOUNTER — Encounter (HOSPITAL_COMMUNITY): Payer: Self-pay

## 2023-04-02 ENCOUNTER — Emergency Department (HOSPITAL_COMMUNITY)
Admission: EM | Admit: 2023-04-02 | Discharge: 2023-04-02 | Disposition: A | Discharge status: Home / Self Care | Payer: Medicaid Other | Attending: Emergency Medicine | Admitting: Emergency Medicine

## 2023-04-02 ENCOUNTER — Other Ambulatory Visit: Payer: Self-pay

## 2023-04-02 DIAGNOSIS — N12 Tubulo-interstitial nephritis, not specified as acute or chronic: Secondary | ICD-10-CM | POA: Diagnosis not present

## 2023-04-02 DIAGNOSIS — W268XXA Contact with other sharp object(s), not elsewhere classified, initial encounter: Secondary | ICD-10-CM | POA: Insufficient documentation

## 2023-04-02 DIAGNOSIS — S61512A Laceration without foreign body of left wrist, initial encounter: Secondary | ICD-10-CM | POA: Insufficient documentation

## 2023-04-02 DIAGNOSIS — S6992XA Unspecified injury of left wrist, hand and finger(s), initial encounter: Secondary | ICD-10-CM | POA: Diagnosis present

## 2023-04-02 LAB — CBC WITH DIFFERENTIAL/PLATELET
Abs Immature Granulocytes: 0.01 10*3/uL (ref 0.00–0.07)
Basophils Absolute: 0 10*3/uL (ref 0.0–0.1)
Basophils Relative: 1 %
Eosinophils Absolute: 0.1 10*3/uL (ref 0.0–0.5)
Eosinophils Relative: 1 %
HCT: 38.3 % (ref 36.0–46.0)
Hemoglobin: 13.1 g/dL (ref 12.0–15.0)
Immature Granulocytes: 0 %
Lymphocytes Relative: 29 %
Lymphs Abs: 1.9 10*3/uL (ref 0.7–4.0)
MCH: 29.5 pg (ref 26.0–34.0)
MCHC: 34.2 g/dL (ref 30.0–36.0)
MCV: 86.3 fL (ref 80.0–100.0)
Monocytes Absolute: 0.4 10*3/uL (ref 0.1–1.0)
Monocytes Relative: 7 %
Neutro Abs: 4.2 10*3/uL (ref 1.7–7.7)
Neutrophils Relative %: 62 %
Platelets: 241 10*3/uL (ref 150–400)
RBC: 4.44 MIL/uL (ref 3.87–5.11)
RDW: 12.5 % (ref 11.5–15.5)
WBC: 6.7 10*3/uL (ref 4.0–10.5)
nRBC: 0 % (ref 0.0–0.2)

## 2023-04-02 LAB — URINALYSIS, ROUTINE W REFLEX MICROSCOPIC
Bilirubin Urine: NEGATIVE
Glucose, UA: NEGATIVE mg/dL
Ketones, ur: NEGATIVE mg/dL
Nitrite: POSITIVE — AB
Protein, ur: 30 mg/dL — AB
Specific Gravity, Urine: 1.013 (ref 1.005–1.030)
WBC, UA: >50 WBC/hpf (ref 0–5)
pH: 5 (ref 5.0–8.0)

## 2023-04-02 LAB — COMPREHENSIVE METABOLIC PANEL
ALT: 10 U/L (ref 0–44)
AST: 14 U/L — ABNORMAL LOW (ref 15–41)
Albumin: 4.2 g/dL (ref 3.5–5.0)
Alkaline Phosphatase: 40 U/L (ref 38–126)
Anion gap: 9 (ref 5–15)
BUN: 13 mg/dL (ref 6–20)
CO2: 21 mmol/L — ABNORMAL LOW (ref 22–32)
Calcium: 8.8 mg/dL — ABNORMAL LOW (ref 8.9–10.3)
Chloride: 107 mmol/L (ref 98–111)
Creatinine, Ser: 0.95 mg/dL (ref 0.44–1.00)
GFR, Estimated: >60 mL/min (ref >60)
Glucose, Bld: 89 mg/dL (ref 70–99)
Potassium: 3.2 mmol/L — ABNORMAL LOW (ref 3.5–5.1)
Sodium: 137 mmol/L (ref 135–145)
Total Bilirubin: 0.5 mg/dL (ref 0.3–1.2)
Total Protein: 7.1 g/dL (ref 6.5–8.1)

## 2023-04-02 LAB — LIPASE, BLOOD: Lipase: 49 U/L (ref 11–51)

## 2023-04-02 LAB — POC URINE PREG, ED: Preg Test, Ur: NEGATIVE

## 2023-04-02 MED ORDER — POTASSIUM CHLORIDE CRYS ER 20 MEQ PO TBCR
40.0000 meq | EXTENDED_RELEASE_TABLET | Freq: Once | ORAL | Status: AC
  Administered 2023-04-02: 40 meq via ORAL
  Filled 2023-04-02: qty 2

## 2023-04-02 MED ORDER — CEPHALEXIN 500 MG PO CAPS
500.0000 mg | ORAL_CAPSULE | Freq: Four times a day (QID) | ORAL | 0 refills | Status: DC
Start: 2023-04-02 — End: 2023-04-15

## 2023-04-02 MED ORDER — CEFTRIAXONE SODIUM 1 G IJ SOLR
1.0000 g | Freq: Once | INTRAMUSCULAR | Status: AC
  Administered 2023-04-02: 1 g via INTRAMUSCULAR
  Filled 2023-04-02: qty 10

## 2023-04-02 MED ORDER — ONDANSETRON 8 MG PO TBDP
8.0000 mg | ORAL_TABLET | Freq: Once | ORAL | Status: AC
  Administered 2023-04-02: 8 mg via ORAL
  Filled 2023-04-02: qty 1

## 2023-04-02 MED ORDER — LIDOCAINE HCL (PF) 2 % IJ SOLN
INTRAMUSCULAR | Status: AC
  Filled 2023-04-02: qty 5

## 2023-04-02 MED ORDER — OXYCODONE-ACETAMINOPHEN 5-325 MG PO TABS
1.0000 | ORAL_TABLET | Freq: Once | ORAL | Status: AC
  Administered 2023-04-02: 1 via ORAL
  Filled 2023-04-02: qty 1

## 2023-04-02 MED ORDER — LIDOCAINE HCL (PF) 1 % IJ SOLN
INTRAMUSCULAR | Status: AC
  Filled 2023-04-02: qty 2

## 2023-04-02 MED ORDER — LIDOCAINE HCL (PF) 2 % IJ SOLN
5.0000 mL | Freq: Once | INTRAMUSCULAR | Status: AC
  Administered 2023-04-02: 5 mL via INTRADERMAL

## 2023-04-02 MED ORDER — POVIDONE-IODINE 10 % EX SOLN
CUTANEOUS | Status: DC | PRN
  Filled 2023-04-02: qty 14.8

## 2023-04-02 MED ORDER — HYDROCODONE-ACETAMINOPHEN 5-325 MG PO TABS: ORAL_TABLET | ORAL | 0 refills | Status: DC

## 2023-04-02 NOTE — ED Provider Notes (Signed)
[place] EMERGENCY DEPARTMENT AT [facility] Provider Note   CSN: [id number] Arrival date & time: 04/02/23  1155     History  Chief Complaint  Patient presents with   Flank Pain    Jessica [name] is a 32 y.o. female.   Flank Pain Pertinent negatives include no abdominal pain.       Jessica [name] is a 32 y.o. female who presents to the Emergency Department complaining of left flank pain, dysuria, burning with urination and intermittent nausea and vomiting.  States her urine appears cloudy.  Symptoms present for several days, gradually worsening. Also, she complains of a laceration to her left wrist that occurred this morning.  States that the metal handle of a broom "jabbed her in the wrist."  She denies numbness or weakness of her fingers.  She does have some pain with movement of her wrist.  Last Td is up-to-date.  Does not take blood thinners.  No history of kidney stones.  Denies SI or HI or attempted self harm   Home Medications Prior to Admission medications   Medication Sig Start Date End Date Taking? Authorizing Provider  PARoxetine (PAXIL) 10 MG tablet Take 1 tablet (10 mg total) by mouth daily. 03/19/23 03/18/24  Adline Potter, NP      Allergies    Atomoxetine, Atarax [hydroxyzine], and Trazodone and nefazodone    Review of Systems   Review of Systems  Constitutional:  Positive for fever. Negative for chills.  Gastrointestinal:  Positive for nausea and vomiting. Negative for abdominal pain and diarrhea.  Genitourinary:  Positive for dysuria, flank pain and frequency. Negative for hematuria, vaginal bleeding and vaginal discharge.  Musculoskeletal:  Negative for myalgias.  Skin:  Positive for wound.       Laceration left wrist  Neurological:  Negative for weakness and numbness.    Physical Exam Updated Vital Signs BP (!) 127/98 (BP Location: Right Arm)   Pulse 84   Temp 98.8 F (37.1 C)   Resp 18   Ht 5\' 2"  (1.575 m)   Wt 66.2 kg   LMP  04/02/2023   SpO2 100%   BMI 26.71 kg/m  Physical Exam Vitals and nursing note reviewed.  Constitutional:      General: She is not in acute distress.    Appearance: Normal appearance. She is not toxic-appearing.  Cardiovascular:     Rate and Rhythm: Normal rate and regular rhythm.     Pulses: Normal pulses.  Pulmonary:     Effort: Pulmonary effort is normal.  Abdominal:     Tenderness: There is no abdominal tenderness. There is left CVA tenderness. There is no right CVA tenderness.  Musculoskeletal:        General: Tenderness and signs of injury present.     Comments: 2.5 cm laceration to distal left wrist.  No edema, no obvious foreign bodies no surrounding erythema.  Patient has full range of motion of the left wrist, grip strength strong and symmetrical bilaterally.  Skin:    General: Skin is warm.     Capillary Refill: Capillary refill takes less than 2 seconds.  Neurological:     General: No focal deficit present.     Mental Status: She is alert.     Sensory: Sensation is intact. No sensory deficit.     Motor: Motor function is intact. No weakness.     ED Results / Procedures / Treatments   Labs (all labs ordered are listed, but only  abnormal results are displayed) Labs Reviewed  URINALYSIS, ROUTINE W REFLEX MICROSCOPIC - Abnormal; Notable for the following components:      Result Value   APPearance HAZY (*)    Hgb urine dipstick LARGE (*)    Protein, ur 30 (*)    Nitrite POSITIVE (*)    Leukocytes,Ua SMALL (*)    Bacteria, UA RARE (*)    All other components within normal limits  COMPREHENSIVE METABOLIC PANEL - Abnormal; Notable for the following components:   Potassium 3.2 (*)    CO2 21 (*)    Calcium 8.8 (*)    AST 14 (*)    All other components within normal limits  URINE CULTURE  CBC WITH DIFFERENTIAL/PLATELET  LIPASE, BLOOD  POC URINE PREG, ED    EKG None  Radiology No results found.  Procedures Procedures     LACERATION REPAIR Performed  by: Anyely Cunning Authorized by: Adaiah Morken Consent: Verbal consent obtained. Risks and benefits: risks, benefits and alternatives were discussed Consent given by: patient Patient identity confirmed: provided demographic data Prepped and Draped in normal sterile fashion Wound explored  Laceration Location: left wrist  Laceration Length: 0.5 cm  No Foreign Bodies seen or palpated  Anesthesia: local infiltration  Local anesthetic: lidocaine 2% w/o epinephrine  Anesthetic total: 2 ml  Irrigation method: syringe Amount of cleaning: standard  Skin closure: 4-0 ethilon  Number of sutures: 1  Technique: simple interrupted  Patient tolerance: Patient tolerated the procedure well with no immediate complications.   Medications Ordered in ED Medications  oxyCODONE-acetaminophen (PERCOCET/ROXICET) 5-325 MG per tablet 1 tablet (has no administration in time range)  ondansetron (ZOFRAN-ODT) disintegrating tablet 8 mg (has no administration in time range)  povidone-iodine (BETADINE) 10 % external solution (has no administration in time range)  lidocaine HCl (PF) (XYLOCAINE) 2 % injection 5 mL (has no administration in time range)    ED Course/ Medical Decision Making/ A&P                                 Medical Decision Making Patient here for evaluation of left flank pain with dysuria.  No history of pyelonephritis or kidney stones.  Endorses some intermittent nausea and vomiting as well.  No fever, no abdominal pain.  She also has a small puncture type laceration to the distal left wrist that she states occurred from a metal broom handle.  Denies any SI or HI.  Td is up-to-date.  Extremities neurovascularly intact.  Do not appreciate any foreign bodies she has full range of motion of the fingers of the left hand and left wrist  Clinically, I suspect her flank pain is associated with developing pyelonephritis.  UTI kidney stone also considered.  Laceration of the wrist appears  consistent with puncture type wound.  Patient adamantly denies any attempted self-harm, SI or HI  Amount and/or Complexity of Data Reviewed Labs: ordered.    Details: Urinalysis shows infection with nitrite positive urine.  Urine pregnancy negative.  Urine culture pending.  No leukocytosis, mild hypokalemia, potassium 3.2.  Lipase unremarkable   Discussion of management or test interpretation with external provider(s): Patient with suspected pyelonephritis.  No radiating flank pain to suggest kidney stone, no history of prior kidney stones.  Endorses pain with urination.  No concerning symptoms for sepsis, she is well-appearing.  No clinical signs of dehydration.  I feel she is appropriate for outpatient therapy, IV Rocephin given  here we will start her on cephalexin.  Her culture is pending laceration of the wrist well-approximated.  She will follow-up closely with PCP and she was given return precautions.  Wound care instructions also given, sutures out in 8 to 10 days.  Risk OTC drugs. Prescription drug management.           Final Clinical Impression(s) / ED Diagnoses Final diagnoses:  Pyelonephritis  Laceration of left wrist, initial encounter    Rx / DC Orders ED Discharge Orders     None         Pauline Aus, PA-C 04/03/23 1442    Terrilee Files, MD 04/03/23 [phone]

## 2023-04-02 NOTE — ED Triage Notes (Signed)
Pt comes in with flank pain that is causing n/v. Pt states feeling chill. Pt states burning with urination and cloudy appearance. Pt also wants her wrist looked at for a small laceration on lt wrist.

## 2023-04-02 NOTE — Discharge Instructions (Signed)
Take the antibiotic as directed until finished.  Drink plenty of water.  Also your potassium was slightly low today.  Please try to eat bananas for the next several days.  Keep the wound clean with mild soap and water keep it bandaged.  States she will need to be removed in 8 to 10 days.  Return to emergency department for any signs of infection.

## 2023-04-04 LAB — URINE CULTURE: Culture: >=100000 — AB

## 2023-04-05 ENCOUNTER — Telehealth (HOSPITAL_BASED_OUTPATIENT_CLINIC_OR_DEPARTMENT_OTHER): Payer: Self-pay | Admitting: *Deleted

## 2023-04-05 NOTE — Telephone Encounter (Signed)
Post ED Visit - Positive Culture Follow-up  Culture report reviewed by antimicrobial stewardship pharmacist: [facility] [x]  Daylene Posey, Pharm.D. []  Celedonio Miyamoto, Pharm.D., BCPS AQ-ID []  Garvin Fila, Pharm.D., BCPS []  Georgina Pillion, Pharm.D., BCPS []  [place], [address].D., BCPS, AAHIVP []  Estella Husk, Pharm.D., BCPS, AAHIVP []  Lysle Pearl, PharmD, BCPS []  Phillips Climes, PharmD, BCPS []  Agapito Games, PharmD, BCPS []  Verlan Friends, PharmD []  Mervyn Gay, PharmD, BCPS []  Vinnie Level, PharmD  [facility] []  Len Childs, PharmD []  Greer Pickerel, PharmD []  Adalberto Cole, PharmD []  Perlie Gold, Rph []  Lonell Face) Jean Rosenthal, PharmD []  Earl Many, PharmD []  Junita Push, PharmD []  Dorna Leitz, PharmD []  Terrilee Files, PharmD []  Lynann Beaver, PharmD []  Keturah Barre, PharmD []  Loralee Pacas, PharmD []  Bernadene Person, PharmD   Positive urine culture Treated with Cephalexin, organism sensitive to the same and no further patient follow-up is required at this time.  Virl Axe [facility] 04/05/2023, 10:36 AM

## 2023-04-15 ENCOUNTER — Other Ambulatory Visit: Payer: Self-pay

## 2023-04-15 ENCOUNTER — Emergency Department (HOSPITAL_COMMUNITY)
Admission: EM | Admit: 2023-04-15 | Discharge: 2023-04-16 | Disposition: A | Discharge status: Home / Self Care | Payer: Medicaid Other | Attending: Emergency Medicine | Admitting: Emergency Medicine

## 2023-04-15 ENCOUNTER — Encounter (HOSPITAL_COMMUNITY): Payer: Self-pay

## 2023-04-15 ENCOUNTER — Telehealth: Payer: Self-pay | Admitting: Clinical

## 2023-04-15 ENCOUNTER — Emergency Department (HOSPITAL_COMMUNITY): Payer: Medicaid Other

## 2023-04-15 DIAGNOSIS — Z720 Tobacco use: Secondary | ICD-10-CM | POA: Diagnosis not present

## 2023-04-15 DIAGNOSIS — N3090 Cystitis, unspecified without hematuria: Secondary | ICD-10-CM | POA: Diagnosis not present

## 2023-04-15 DIAGNOSIS — R350 Frequency of micturition: Secondary | ICD-10-CM | POA: Diagnosis present

## 2023-04-15 DIAGNOSIS — M545 Low back pain, unspecified: Secondary | ICD-10-CM | POA: Diagnosis not present

## 2023-04-15 LAB — BASIC METABOLIC PANEL
Anion gap: 5 (ref 5–15)
BUN: 10 mg/dL (ref 6–20)
CO2: 24 mmol/L (ref 22–32)
Calcium: 8.4 mg/dL — ABNORMAL LOW (ref 8.9–10.3)
Chloride: 105 mmol/L (ref 98–111)
Creatinine, Ser: 0.71 mg/dL (ref 0.44–1.00)
GFR, Estimated: >60 mL/min (ref >60)
Glucose, Bld: 95 mg/dL (ref 70–99)
Potassium: 3.9 mmol/L (ref 3.5–5.1)
Sodium: 134 mmol/L — ABNORMAL LOW (ref 135–145)

## 2023-04-15 LAB — CBC
HCT: 38.3 % (ref 36.0–46.0)
Hemoglobin: 12.6 g/dL (ref 12.0–15.0)
MCH: 29.1 pg (ref 26.0–34.0)
MCHC: 32.9 g/dL (ref 30.0–36.0)
MCV: 88.5 fL (ref 80.0–100.0)
Platelets: 256 10*3/uL (ref 150–400)
RBC: 4.33 MIL/uL (ref 3.87–5.11)
RDW: 12.5 % (ref 11.5–15.5)
WBC: 6.9 10*3/uL (ref 4.0–10.5)
nRBC: 0 % (ref 0.0–0.2)

## 2023-04-15 LAB — URINALYSIS, ROUTINE W REFLEX MICROSCOPIC
Bilirubin Urine: NEGATIVE
Glucose, UA: NEGATIVE mg/dL
Hgb urine dipstick: NEGATIVE
Ketones, ur: NEGATIVE mg/dL
Leukocytes,Ua: NEGATIVE
Nitrite: NEGATIVE
Protein, ur: NEGATIVE mg/dL
Specific Gravity, Urine: 1.016 (ref 1.005–1.030)
pH: 8 (ref 5.0–8.0)

## 2023-04-15 LAB — PREGNANCY, URINE: Preg Test, Ur: NEGATIVE

## 2023-04-15 MED ORDER — OXYCODONE-ACETAMINOPHEN 5-325 MG PO TABS
1.0000 | ORAL_TABLET | Freq: Once | ORAL | Status: AC
  Administered 2023-04-15: 1 via ORAL
  Filled 2023-04-15: qty 1

## 2023-04-15 MED ORDER — ONDANSETRON HCL 4 MG/2ML IJ SOLN
4.0000 mg | Freq: Once | INTRAMUSCULAR | Status: AC
  Administered 2023-04-15: 4 mg via INTRAVENOUS
  Filled 2023-04-15: qty 2

## 2023-04-15 MED ORDER — ONDANSETRON HCL 4 MG PO TABS: 4.0000 mg | ORAL_TABLET | Freq: Three times a day (TID) | ORAL | 0 refills | Status: DC | PRN

## 2023-04-15 MED ORDER — HYDROCODONE-ACETAMINOPHEN 5-325 MG PO TABS: ORAL_TABLET | ORAL | 0 refills | Status: DC

## 2023-04-15 MED ORDER — SODIUM CHLORIDE 0.9 % IV SOLN
1.0000 g | Freq: Once | INTRAVENOUS | Status: AC
  Administered 2023-04-15: 1 g via INTRAVENOUS
  Filled 2023-04-15: qty 10

## 2023-04-15 MED ORDER — SODIUM CHLORIDE 0.9 % IV BOLUS
1000.0000 mL | Freq: Once | INTRAVENOUS | Status: AC
  Administered 2023-04-15: 1000 mL via INTRAVENOUS

## 2023-04-15 MED ORDER — CEPHALEXIN 500 MG PO CAPS: 500.0000 mg | ORAL_CAPSULE | Freq: Four times a day (QID) | ORAL | 0 refills | Status: DC

## 2023-04-15 NOTE — Telephone Encounter (Signed)
Pt is getting ready to go to ER now

## 2023-04-15 NOTE — ED Notes (Signed)
Patient transported to CT 

## 2023-04-15 NOTE — Telephone Encounter (Signed)
Pt called, per referral to [facility]. Pt concerned about the following:   "Paxil not working for bipolar"; requests medication specifically for bipolar disorder and anxiety with panic. Pt has upcoming appointment at [facility], not until 06/03/23 for initial visit, no psychiatry appointment.  Pt lost medication for kidney infection, symptoms have gotten worse since unable to take medicine: "blood in urine and stool, cold chills, nausea, side hurting really bad". (ED prescriber recommends pt come back to ED today with worsening symptoms; pt agrees).   Pt will:  Go to ED today Reset password on MyChart today to access messages (MyChart Help Desk number was given to pt over the phone) MyChart message will be sent to pt regarding walk-in options for psychiatry in [place] and [facility] Attend all upcoming scheduled appointments: 04/23/23 (neurology); 04/30/23 (ob/gyn) 05/04/23 (integrated BH); 06/03/23 [facility] [place])  Both OB and ED prescriber have been notified

## 2023-04-15 NOTE — ED Provider Notes (Signed)
[place] EMERGENCY DEPARTMENT AT [facility] Provider Note   CSN: [id number] Arrival date & time: 04/15/23  1806     History {Add pertinent medical, surgical, social history, OB history to HPI:1} Chief Complaint  Patient presents with   Urinary Frequency    Jessica [name] is a 32 y.o. female.  She is here with a complaint of continued nausea left lower back pain and urinary symptoms, subjective fevers and chills.  She said she was here few weeks ago and was diagnosed with a kidney infection and took 2 days of medication.  She said she lost the meds after that and her symptoms have recurred for the last 4 days.  She was unable to see your primary care doctor today so came here for further evaluation.  She was positive for greater than 100,000 colonies of E. coli during that last visit in the ER.  The history is provided by the patient.  Urinary Frequency This is a recurrent problem. The current episode started more than 2 days ago. The problem occurs constantly. The problem has not changed since onset.Associated symptoms include abdominal pain. Pertinent negatives include no chest pain, no headaches and no shortness of breath. The symptoms are aggravated by bending and twisting. Nothing relieves the symptoms. She has tried rest for the symptoms. The treatment provided no relief.       Home Medications Prior to Admission medications   Medication Sig Start Date End Date Taking? Authorizing Provider  cephALEXin (KEFLEX) 500 MG capsule Take 1 capsule (500 mg total) by mouth 4 (four) times daily. 04/02/23   Triplett, Tammy, PA-C  HYDROcodone-acetaminophen (NORCO/VICODIN) 5-325 MG tablet Take one tab po q 4 hrs prn pain 04/02/23   Triplett, Tammy, PA-C  PARoxetine (PAXIL) 10 MG tablet Take 1 tablet (10 mg total) by mouth daily. 03/19/23 03/18/24  Adline Potter, NP      Allergies    Atomoxetine, Atarax [hydroxyzine], and Trazodone and nefazodone    Review of Systems   Review  of Systems  Constitutional:  Positive for chills and fever.  Respiratory:  Negative for shortness of breath.   Cardiovascular:  Negative for chest pain.  Gastrointestinal:  Positive for abdominal pain and nausea.  Genitourinary:  Positive for flank pain and frequency.  Musculoskeletal:  Positive for back pain.  Neurological:  Negative for headaches.    Physical Exam Updated Vital Signs BP 136/79   Pulse 72   Temp 98.6 F (37 C)   Resp 12   Ht 5\' 2"  (1.575 m)   Wt 63.5 kg   LMP 04/02/2023   SpO2 100%   BMI 25.61 kg/m  Physical Exam Vitals and nursing note reviewed.  Constitutional:      General: She is not in acute distress.    Appearance: Normal appearance. She is well-developed.  HENT:     Head: Normocephalic and atraumatic.  Eyes:     Conjunctiva/sclera: Conjunctivae normal.  Cardiovascular:     Rate and Rhythm: Normal rate and regular rhythm.     Heart sounds: No murmur heard. Pulmonary:     Effort: Pulmonary effort is normal. No respiratory distress.     Breath sounds: Normal breath sounds.  Abdominal:     Palpations: Abdomen is soft.     Tenderness: There is no abdominal tenderness. There is no guarding or rebound.  Musculoskeletal:        General: No deformity. Normal range of motion.     Cervical back: Neck supple.  Skin:    General: Skin is warm and dry.     Capillary Refill: Capillary refill takes less than 2 seconds.  Neurological:     General: No focal deficit present.     Mental Status: She is alert.     Sensory: No sensory deficit.     Motor: No weakness.     ED Results / Procedures / Treatments   Labs (all labs ordered are listed, but only abnormal results are displayed) Labs Reviewed  URINALYSIS, ROUTINE W REFLEX MICROSCOPIC - Abnormal; Notable for the following components:      Result Value   APPearance CLOUDY (*)    All other components within normal limits  BASIC METABOLIC PANEL - Abnormal; Notable for the following components:    Sodium 134 (*)    Calcium 8.4 (*)    All other components within normal limits  CBC  PREGNANCY, URINE    EKG None  Radiology No results found.  Procedures Procedures  {Document cardiac monitor, telemetry assessment procedure when appropriate:1}  Medications Ordered in ED Medications  oxyCODONE-acetaminophen (PERCOCET/ROXICET) 5-325 MG per tablet 1 tablet (has no administration in time range)  sodium chloride 0.9 % bolus 1,000 mL (has no administration in time range)  ondansetron (ZOFRAN) injection 4 mg (has no administration in time range)    ED Course/ Medical Decision Making/ A&P   {   Click here for ABCD2, HEART and other calculatorsREFRESH Note before signing :1}                              Medical Decision Making Amount and/or Complexity of Data Reviewed Labs: ordered. Radiology: ordered.  Risk Prescription drug management.   This patient complains of ***; this involves an extensive number of treatment Options and is a complaint that carries with it a high risk of complications and morbidity. The differential includes ***  I ordered, reviewed and interpreted labs, which included *** I ordered medication *** and reviewed PMP when indicated. I ordered imaging studies which included *** and I independently    visualized and interpreted imaging which showed *** Additional history obtained from *** Previous records obtained and reviewed *** I consulted *** and discussed lab and imaging findings and discussed disposition.  Cardiac monitoring reviewed, *** Social determinants considered, *** Critical Interventions: ***  After the interventions stated above, I reevaluated the patient and found *** Admission and further testing considered, ***   {Document critical care time when appropriate:1} {Document review of labs and clinical decision tools ie heart score, Chads2Vasc2 etc:1}  {Document your independent review of radiology images, and any outside  records:1} {Document your discussion with family members, caretakers, and with consultants:1} {Document social determinants of health affecting pt's care:1} {Document your decision making why or why not admission, treatments were needed:1} Final Clinical Impression(s) / ED Diagnoses Final diagnoses:  None    Rx / DC Orders ED Discharge Orders     None

## 2023-04-15 NOTE — ED Triage Notes (Signed)
C/o nausea, fever, chills, and left flank pain.  Pt reports diagnosed with kidney infection on 8/22 and started on abx. Pt reports not finishing abx due to losing them.  Urinary frequency, urgency, and hematuria.

## 2023-04-15 NOTE — Discharge Instructions (Signed)
You were seen in the emergency department for continued urinary symptoms flank pain.  Your urine looked much better than last time you were here.  We will refill your pain medication prescription along with antibiotics added some nausea medicine.  Please follow-up with your primary care doctor.

## 2023-04-16 MED ORDER — IBUPROFEN 800 MG PO TABS
800.0000 mg | ORAL_TABLET | Freq: Once | ORAL | Status: AC
  Administered 2023-04-16: 800 mg via ORAL
  Filled 2023-04-16: qty 1

## 2023-04-16 MED ORDER — ONDANSETRON 8 MG PO TBDP
8.0000 mg | ORAL_TABLET | Freq: Once | ORAL | Status: AC
  Administered 2023-04-16: 8 mg via ORAL
  Filled 2023-04-16: qty 1

## 2023-04-20 NOTE — BH Specialist Note (Signed)
Pt did not arrive to video visit and did not answer the phone, as phone did not connect after several attempts; left MyChart message for patient.
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
## 2023-04-23 ENCOUNTER — Ambulatory Visit: Payer: Medicaid Other | Admitting: Neurology

## 2023-04-30 ENCOUNTER — Ambulatory Visit: Payer: Medicaid Other | Admitting: Adult Health

## 2023-05-04 ENCOUNTER — Ambulatory Visit: Payer: Medicaid Other | Admitting: Clinical

## 2023-05-04 DIAGNOSIS — Z91199 Patient's noncompliance with other medical treatment and regimen due to unspecified reason: Secondary | ICD-10-CM

## 2023-06-01 ENCOUNTER — Telehealth (HOSPITAL_COMMUNITY): Payer: Self-pay

## 2023-06-01 NOTE — Telephone Encounter (Signed)
Called to confirm 06/03/23 appt call cannot be completed as dailed

## 2023-06-02 ENCOUNTER — Encounter (HOSPITAL_COMMUNITY): Payer: Self-pay

## 2023-06-02 NOTE — Telephone Encounter (Signed)
Called to confirm number not available sent mychart message

## 2023-06-03 ENCOUNTER — Ambulatory Visit (HOSPITAL_COMMUNITY): Payer: Medicaid Other | Admitting: Clinical

## 2023-07-15 ENCOUNTER — Encounter (HOSPITAL_COMMUNITY): Payer: Self-pay

## 2023-07-15 ENCOUNTER — Emergency Department (HOSPITAL_COMMUNITY)
Admission: EM | Admit: 2023-07-15 | Discharge: 2023-07-15 | Disposition: A | Discharge status: Home / Self Care | Payer: Medicaid Other | Attending: Emergency Medicine | Admitting: Emergency Medicine

## 2023-07-15 ENCOUNTER — Other Ambulatory Visit: Payer: Self-pay

## 2023-07-15 DIAGNOSIS — M545 Low back pain, unspecified: Secondary | ICD-10-CM | POA: Insufficient documentation

## 2023-07-15 HISTORY — DX: Other specified anxiety disorders: F41.8

## 2023-07-15 MED ORDER — METHOCARBAMOL 500 MG PO TABS: 500.0000 mg | ORAL_TABLET | Freq: Two times a day (BID) | ORAL | 0 refills | Status: DC

## 2023-07-15 MED ORDER — METHOCARBAMOL 500 MG PO TABS
500.0000 mg | ORAL_TABLET | Freq: Once | ORAL | Status: DC
  Filled 2023-07-15: qty 1

## 2023-07-15 MED ORDER — PREDNISONE 50 MG PO TABS: 50.0000 mg | ORAL_TABLET | Freq: Every day | ORAL | 0 refills | Status: DC

## 2023-07-15 MED ORDER — KETOROLAC TROMETHAMINE 30 MG/ML IJ SOLN
30.0000 mg | Freq: Once | INTRAMUSCULAR | Status: DC
  Filled 2023-07-15: qty 1

## 2023-07-15 MED ORDER — ETODOLAC 300 MG PO CAPS: 300.0000 mg | ORAL_CAPSULE | Freq: Three times a day (TID) | ORAL | 0 refills | Status: DC

## 2023-07-15 NOTE — Discharge Instructions (Signed)
Make sure to take your medications on a full stomach.  Follow-up with a primary care doctor or consider seeing a orthopedic or neurosurgical spine doctor to be checked if your symptoms persist.  Monitor for stomach upset or dark-colored stools

## 2023-07-15 NOTE — ED Provider Notes (Signed)
[place] EMERGENCY DEPARTMENT AT [facility] Provider Note   CSN: [id number] Arrival date & time: 07/15/23  2015     History   Jessica [name] is a 32 y.o. female.  HPI   Patient presents ED with complaints of low back pain that started a couple weeks ago.  Patient states the pain is in her lower back.  It does go up towards her upper back.  Occasionally she feels some numbness down her legs.  Patient denies any weakness.  No incontinence.  No fevers or chills.  No urinary symptoms.  Patient states she has had symptoms in the past but does not see anyone regularly for back  Home Medications Prior to Admission medications   Medication Sig Start Date End Date Taking? Authorizing Provider  etodolac (LODINE) 300 MG capsule Take 1 capsule (300 mg total) by mouth every 8 (eight) hours. 07/15/23  Yes Linwood Dibbles, MD  methocarbamol (ROBAXIN) 500 MG tablet Take 1 tablet (500 mg total) by mouth 2 (two) times daily. 07/15/23  Yes Linwood Dibbles, MD  predniSONE (DELTASONE) 50 MG tablet Take 1 tablet (50 mg total) by mouth daily. 07/15/23  Yes Linwood Dibbles, MD  cephALEXin (KEFLEX) 500 MG capsule Take 1 capsule (500 mg total) by mouth 4 (four) times daily. 04/15/23   Terrilee Files, MD  HYDROcodone-acetaminophen (NORCO/VICODIN) 5-325 MG tablet Take one tab po q 4 hrs prn pain 04/15/23   Terrilee Files, MD  ondansetron (ZOFRAN) 4 MG tablet Take 1 tablet (4 mg total) by mouth every 8 (eight) hours as needed for nausea or vomiting. 04/15/23   Terrilee Files, MD  PARoxetine (PAXIL) 10 MG tablet Take 1 tablet (10 mg total) by mouth daily. 03/19/23 03/18/24  Adline Potter, NP      Allergies    Atomoxetine, Atarax [hydroxyzine], and Trazodone and nefazodone    Review of Systems   Review of Systems  Physical Exam Updated Vital Signs BP 114/67 (BP Location: Right Arm)   Pulse 67   Temp 98.2 F (36.8 C) (Oral)   Resp 16   Ht 1.575 m (5\' 2" )   Wt 63.5 kg   SpO2 100%   BMI 25.61 kg/m   Physical Exam Vitals and nursing note reviewed.  Constitutional:      General: She is not in acute distress.    Appearance: She is well-developed.  HENT:     Head: Normocephalic and atraumatic.     Right Ear: External ear normal.     Left Ear: External ear normal.  Eyes:     General: No scleral icterus.       Right eye: No discharge.        Left eye: No discharge.     Conjunctiva/sclera: Conjunctivae normal.  Neck:     Trachea: No tracheal deviation.  Cardiovascular:     Rate and Rhythm: Normal rate.  Pulmonary:     Effort: Pulmonary effort is normal. No respiratory distress.     Breath sounds: No stridor.  Abdominal:     General: There is no distension.  Musculoskeletal:        General: No swelling or deformity.     Cervical back: Neck supple.  Skin:    General: Skin is warm and dry.     Findings: No rash.  Neurological:     Mental Status: She is alert. Mental status is at baseline.     Cranial Nerves: No dysarthria or facial asymmetry.  Motor: No seizure activity.     ED Results / Procedures / Treatments   Labs (all labs ordered are listed, but only abnormal results are displayed) Labs Reviewed - No data to display  EKG None  Radiology No results found.  Procedures Procedures    Medications Ordered in ED Medications  ketorolac (TORADOL) 30 MG/ML injection 30 mg (has no administration in time range)  methocarbamol (ROBAXIN) tablet 500 mg (has no administration in time range)    ED Course/ Medical Decision Making/ A&P                                 Medical Decision Making Problems Addressed: Acute low back pain, unspecified back pain laterality, unspecified whether sciatica present: acute illness or injury that poses a threat to life or bodily functions  Risk Prescription drug management.   Patient presented to the ED with complaints of low back pain.  On exam patient has mild tenderness palpation the paraspinal region.  She does have  possible straight leg raise suggesting a component of sciatica.  No acute neurodeficits.  Patient was able ambulate without difficulty.  Will discharge home with course of steroids and sent muscle relaxants.  Discussed outpatient follow-up with PCP or possibly orthopedic spine or neurosurgical doctor        Final Clinical Impression(s) / ED Diagnoses Final diagnoses:  Acute low back pain, unspecified back pain laterality, unspecified whether sciatica present    Rx / DC Orders ED Discharge Orders          Ordered    methocarbamol (ROBAXIN) 500 MG tablet  2 times daily        07/15/23 2219    predniSONE (DELTASONE) 50 MG tablet  Daily        07/15/23 2219    etodolac (LODINE) 300 MG capsule  Every 8 hours       Note to Pharmacy: As needed for pain   07/15/23 2219              Linwood Dibbles, MD 07/15/23 2222

## 2023-07-15 NOTE — ED Triage Notes (Signed)
Pt reports lower back pain x 2 weeks. Pt denies injury
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
## 2023-09-23 ENCOUNTER — Ambulatory Visit: Payer: Medicaid Other | Admitting: Neurology

## 2023-09-23 ENCOUNTER — Encounter: Payer: Self-pay | Admitting: Neurology

## 2023-11-03 ENCOUNTER — Other Ambulatory Visit: Payer: Self-pay

## 2023-11-03 ENCOUNTER — Emergency Department (HOSPITAL_COMMUNITY)
Admission: EM | Admit: 2023-11-03 | Discharge: 2023-11-03 | Disposition: A | Discharge status: Home / Self Care | Attending: Emergency Medicine | Admitting: Emergency Medicine

## 2023-11-03 ENCOUNTER — Encounter (HOSPITAL_COMMUNITY): Payer: Self-pay | Admitting: Emergency Medicine

## 2023-11-03 DIAGNOSIS — K0889 Other specified disorders of teeth and supporting structures: Secondary | ICD-10-CM | POA: Insufficient documentation

## 2023-11-03 MED ORDER — HYDROCODONE-ACETAMINOPHEN 5-325 MG PO TABS
ORAL_TABLET | ORAL | 0 refills | Status: DC
Start: 2023-11-03 — End: 2023-12-25

## 2023-11-03 MED ORDER — PENICILLIN V POTASSIUM 500 MG PO TABS: 500.0000 mg | ORAL_TABLET | Freq: Four times a day (QID) | ORAL | 0 refills | Status: AC

## 2023-11-03 NOTE — ED Provider Notes (Signed)
 [place] EMERGENCY DEPARTMENT AT [facility] Provider Note   CSN: [id number] Arrival date & time: 11/03/23  1448     History {Add pertinent medical, surgical, social history, OB history to HPI:1} Chief Complaint  Patient presents with   Dental Pain    Jessica [name] is a 33 y.o. female.  Patient complains of pain in her gum on the left side lower gum.   Dental Pain      Home Medications Prior to Admission medications   Medication Sig Start Date End Date Taking? Authorizing Provider  HYDROcodone-acetaminophen (NORCO/VICODIN) 5-325 MG tablet Take 1 every 6 hours as needed for pain that is not relieved by Tylenol or Motrin alone 11/03/23  Yes Bethann Berkshire, MD  penicillin v potassium (VEETID) 500 MG tablet Take 1 tablet (500 mg total) by mouth 4 (four) times daily for 7 days. 11/03/23 11/10/23 Yes Bethann Berkshire, MD  etodolac (LODINE) 300 MG capsule Take 1 capsule (300 mg total) by mouth every 8 (eight) hours. 07/15/23   Linwood Dibbles, MD  methocarbamol (ROBAXIN) 500 MG tablet Take 1 tablet (500 mg total) by mouth 2 (two) times daily. 07/15/23   Linwood Dibbles, MD  ondansetron (ZOFRAN) 4 MG tablet Take 1 tablet (4 mg total) by mouth every 8 (eight) hours as needed for nausea or vomiting. 04/15/23   Terrilee Files, MD  PARoxetine (PAXIL) 10 MG tablet Take 1 tablet (10 mg total) by mouth daily. 03/19/23 03/18/24  Adline Potter, NP  predniSONE (DELTASONE) 50 MG tablet Take 1 tablet (50 mg total) by mouth daily. 07/15/23   Linwood Dibbles, MD      Allergies    Atomoxetine, Atarax [hydroxyzine], and Trazodone and nefazodone    Review of Systems   Review of Systems  Physical Exam Updated Vital Signs BP 105/65 (BP Location: Right Arm)   Pulse 66   Temp 98.8 F (37.1 C) (Oral)   Resp 16   Ht 5\' 2"  (1.575 m)   Wt 61.2 kg   LMP 10/31/2023 (Approximate)   SpO2 99%   BMI 24.69 kg/m  Physical Exam  ED Results / Procedures / Treatments   Labs (all labs ordered are listed, but  only abnormal results are displayed) Labs Reviewed - No data to display  EKG None  Radiology No results found.  Procedures Procedures  {Document cardiac monitor, telemetry assessment procedure when appropriate:1}  Medications Ordered in ED Medications - No data to display  ED Course/ Medical Decision Making/ A&P   {   Click here for ABCD2, HEART and other calculatorsREFRESH Note before signing :1}                              Medical Decision Making Risk Prescription drug management.  Patient with possible abscessed tooth.  She will be placed on penicillin and hydrocodone if Tylenol or Motrin does not help with the pain and referred to see a dentist next week  {Document critical care time when appropriate:1} {Document review of labs and clinical decision tools ie heart score, Chads2Vasc2 etc:1}  {Document your independent review of radiology images, and any outside records:1} {Document your discussion with family members, caretakers, and with consultants:1} {Document social determinants of health affecting pt's care:1} {Document your decision making why or why not admission, treatments were needed:1} Final Clinical Impression(s) / ED Diagnoses Final diagnoses:  Pain, dental    Rx / DC Orders ED Discharge Orders  Ordered    penicillin v potassium (VEETID) 500 MG tablet  4 times daily        11/03/23 1554    HYDROcodone-acetaminophen (NORCO/VICODIN) 5-325 MG tablet        11/03/23 1554

## 2023-11-03 NOTE — Discharge Instructions (Signed)
Follow up with a dentist next week. °

## 2023-11-03 NOTE — ED Notes (Signed)
 Pt d/c home per EDP order. Discharge summary reviewed, NAD

## 2023-11-03 NOTE — ED Triage Notes (Signed)
 Pt bib pov w/ c/o dental pain lower left. Pt reports swelling. Pt denies any dysphasia. Pt reports a headache. Pt reports a fever but is afebrile in triage.

## 2023-12-25 ENCOUNTER — Other Ambulatory Visit: Payer: Self-pay

## 2023-12-25 ENCOUNTER — Emergency Department (HOSPITAL_COMMUNITY)
Admission: EM | Admit: 2023-12-25 | Discharge: 2023-12-25 | Disposition: A | Discharge status: Home / Self Care | Attending: Emergency Medicine | Admitting: Emergency Medicine

## 2023-12-25 ENCOUNTER — Encounter (HOSPITAL_COMMUNITY): Payer: Self-pay

## 2023-12-25 DIAGNOSIS — M545 Low back pain, unspecified: Secondary | ICD-10-CM | POA: Diagnosis present

## 2023-12-25 DIAGNOSIS — M5442 Lumbago with sciatica, left side: Secondary | ICD-10-CM | POA: Insufficient documentation

## 2023-12-25 DIAGNOSIS — M5441 Lumbago with sciatica, right side: Secondary | ICD-10-CM | POA: Diagnosis not present

## 2023-12-25 MED ORDER — MELOXICAM 15 MG PO TBDP: 15.0000 mg | ORAL_TABLET | Freq: Every day | ORAL | 0 refills | Status: AC

## 2023-12-25 MED ORDER — DEXAMETHASONE SODIUM PHOSPHATE 10 MG/ML IJ SOLN
10.0000 mg | Freq: Once | INTRAMUSCULAR | Status: AC
  Administered 2023-12-25: 10 mg via INTRAMUSCULAR
  Filled 2023-12-25: qty 1

## 2023-12-25 MED ORDER — METHOCARBAMOL 500 MG PO TABS: 500.0000 mg | ORAL_TABLET | Freq: Two times a day (BID) | ORAL | 0 refills | Status: DC | PRN

## 2023-12-25 NOTE — Discharge Instructions (Signed)
 Please take Mobic,  once daily as needed for pain - this in an antiinflammatory medicine (NSAID) and is similar to ibuprofen - many people feel that it is stronger than ibuprofen and it is easier to take since it is a smaller pill.  Please use this only for 1 week - if your pain persists, you will need to follow up with your doctor in the office for ongoing guidance and pain control.  Please take Robaxin, 500 mg up to 2 or 3 times a day as needed for muscle spasm, this is a muscle relaxer, it may cause generalized weakness, sleepiness and you should not drive or do important things while taking this medication.  This includes driving a vehicle or taking care of young children, these things should not be done while taking this medication.   Thank you for allowing [place] to treat you in the emergency department today.  After reviewing your examination and potential testing that was done it appears that you are safe to go home.  I would like for you to follow-up with your doctor within the next several days, have them obtain your records and follow-up with them to review all potential tests and results from your visit.  If you should develop severe or worsening symptoms return to the emergency department immediately
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
## 2024-01-06 ENCOUNTER — Encounter (HOSPITAL_COMMUNITY): Payer: Self-pay | Admitting: Emergency Medicine

## 2024-01-06 ENCOUNTER — Other Ambulatory Visit: Payer: Self-pay

## 2024-01-06 ENCOUNTER — Emergency Department (HOSPITAL_COMMUNITY)
Admission: EM | Admit: 2024-01-06 | Discharge: 2024-01-06 | Disposition: A | Discharge status: Home / Self Care | Attending: Emergency Medicine | Admitting: Emergency Medicine

## 2024-01-06 DIAGNOSIS — M5431 Sciatica, right side: Secondary | ICD-10-CM | POA: Insufficient documentation

## 2024-01-06 DIAGNOSIS — M549 Dorsalgia, unspecified: Secondary | ICD-10-CM | POA: Diagnosis present

## 2024-01-06 LAB — URINALYSIS, ROUTINE W REFLEX MICROSCOPIC
Bilirubin Urine: NEGATIVE
Glucose, UA: NEGATIVE mg/dL
Hgb urine dipstick: NEGATIVE
Ketones, ur: NEGATIVE mg/dL
Leukocytes,Ua: NEGATIVE
Nitrite: NEGATIVE
Protein, ur: NEGATIVE mg/dL
Specific Gravity, Urine: 1.02 (ref 1.005–1.030)
pH: 5 (ref 5.0–8.0)

## 2024-01-06 LAB — PREGNANCY, URINE: Preg Test, Ur: NEGATIVE

## 2024-01-06 MED ORDER — METHYLPREDNISOLONE 4 MG PO TBPK: ORAL_TABLET | ORAL | 0 refills | Status: DC

## 2024-01-06 MED ORDER — KETOROLAC TROMETHAMINE 15 MG/ML IJ SOLN
15.0000 mg | Freq: Once | INTRAMUSCULAR | Status: AC
  Administered 2024-01-06: 15 mg via INTRAMUSCULAR
  Filled 2024-01-06: qty 1

## 2024-01-06 MED ORDER — LIDOCAINE 5 % EX PTCH
1.0000 | MEDICATED_PATCH | CUTANEOUS | Status: DC
  Administered 2024-01-06: 1 via TRANSDERMAL
  Filled 2024-01-06: qty 1

## 2024-01-06 MED ORDER — CYCLOBENZAPRINE HCL 10 MG PO TABS: 10.0000 mg | ORAL_TABLET | Freq: Two times a day (BID) | ORAL | 0 refills | Status: DC | PRN

## 2024-01-06 MED ORDER — HYDROCODONE-ACETAMINOPHEN 5-325 MG PO TABS
1.0000 | ORAL_TABLET | Freq: Once | ORAL | Status: AC
  Administered 2024-01-06: 1 via ORAL
  Filled 2024-01-06: qty 1
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
## 2024-01-06 NOTE — Discharge Instructions (Addendum)
 You were seen for your back pain in the emergency department.   At home, please use over-the-counter Tylenol , ibuprofen , and lidocaine  patches.  You may also use the cyclobenzaprine  we have prescribed you as needed for pain.  Do not take the cyclobenzaprine  before driving or operating heavy machinery.  Please also take the Medrol Dosepak we have prescribed you.   Follow-up with your primary doctor in 2-3 days regarding your visit.  Follow-up with the spine clinic as soon as possible regarding your symptoms.  Return immediately to the emergency department if you experience any of the following: Numbness or weakness of your legs, bowel or bladder incontinence, numbness while wiping after pooping or urinating, or any other concerning symptoms.    Thank you for visiting our Emergency Department. It was a pleasure taking care of you today.
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
## 2024-01-10 ENCOUNTER — Other Ambulatory Visit: Payer: Self-pay

## 2024-01-10 ENCOUNTER — Emergency Department (HOSPITAL_COMMUNITY)
Admission: EM | Admit: 2024-01-10 | Discharge: 2024-01-10 | Disposition: A | Discharge status: Home / Self Care | Attending: Emergency Medicine | Admitting: Emergency Medicine

## 2024-01-10 ENCOUNTER — Encounter (HOSPITAL_COMMUNITY): Payer: Self-pay | Admitting: Emergency Medicine

## 2024-01-10 DIAGNOSIS — M5431 Sciatica, right side: Secondary | ICD-10-CM

## 2024-01-10 DIAGNOSIS — M5441 Lumbago with sciatica, right side: Secondary | ICD-10-CM | POA: Insufficient documentation

## 2024-01-10 DIAGNOSIS — M5442 Lumbago with sciatica, left side: Secondary | ICD-10-CM | POA: Diagnosis not present

## 2024-01-10 DIAGNOSIS — M545 Low back pain, unspecified: Secondary | ICD-10-CM | POA: Diagnosis present

## 2024-01-10 MED ORDER — HYDROCODONE-ACETAMINOPHEN 5-325 MG PO TABS: 1.0000 | ORAL_TABLET | ORAL | 0 refills | Status: DC | PRN

## 2024-01-10 NOTE — ED Provider Notes (Signed)
 [place] EMERGENCY DEPARTMENT AT [facility] Provider Note   CSN: [id number] Arrival date & time: 01/10/24  0102     History  Chief Complaint  Patient presents with   Back Pain    Jessica [name] is a 33 y.o. female.  Patient presents with persistent back pain.  Patient reports bilateral lower back pain radiating down both of her legs when she walks.  She was seen in the ED for this already and placed on muscle relaxer and steroids, reports no improvement.  No change in bowel or bladder function.       Home Medications Prior to Admission medications   Medication Sig Start Date End Date Taking? Authorizing Provider  HYDROcodone -acetaminophen  (NORCO/VICODIN) 5-325 MG tablet Take 1 tablet by mouth every 4 (four) hours as needed. 01/10/24  Yes Wyllow Seigler, Marine Sia, MD  cyclobenzaprine  (FLEXERIL ) 10 MG tablet Take 1 tablet (10 mg total) by mouth 2 (two) times daily as needed for muscle spasms. 01/06/24   Ninetta Basket, MD  methylPREDNISolone  (MEDROL  DOSEPAK) 4 MG TBPK tablet Take as directed on packaging 01/06/24   Ninetta Basket, MD  PARoxetine  (PAXIL ) 10 MG tablet Take 1 tablet (10 mg total) by mouth daily. 03/19/23 03/18/24  Javan Messing, NP      Allergies    Atomoxetine , Atarax  [hydroxyzine ], and Trazodone  and nefazodone    Review of Systems   Review of Systems  Physical Exam Updated Vital Signs BP 113/76 (BP Location: Left Arm)   Pulse 79   Temp 98.1 F (36.7 C) (Oral)   Resp 18   Ht 5\' 2"  (1.575 m)   Wt 64 kg   LMP 12/08/2023 (Approximate)   SpO2 98%   BMI 25.81 kg/m  Physical Exam Vitals and nursing note reviewed.  Constitutional:      General: She is not in acute distress.    Appearance: She is well-developed.  HENT:     Head: Normocephalic and atraumatic.     Mouth/Throat:     Mouth: Mucous membranes are moist.  Eyes:     General: Vision grossly intact. Gaze aligned appropriately.     Extraocular Movements: Extraocular movements  intact.     Conjunctiva/sclera: Conjunctivae normal.  Cardiovascular:     Rate and Rhythm: Normal rate and regular rhythm.     Pulses: Normal pulses.     Heart sounds: Normal heart sounds, S1 normal and S2 normal. No murmur heard.    No friction rub. No gallop.  Pulmonary:     Effort: Pulmonary effort is normal. No respiratory distress.     Breath sounds: Normal breath sounds.  Abdominal:     General: Bowel sounds are normal.     Palpations: Abdomen is soft.     Tenderness: There is no abdominal tenderness. There is no guarding or rebound.     Hernia: No hernia is present.  Musculoskeletal:        General: No swelling.     Cervical back: Full passive range of motion without pain, normal range of motion and neck supple. No spinous process tenderness or muscular tenderness. Normal range of motion.     Lumbar back: Spasms and tenderness present. Decreased range of motion. Positive right straight leg raise test and positive left straight leg raise test.     Right lower leg: No edema.     Left lower leg: No edema.  Skin:    General: Skin is warm and dry.     Capillary Refill:  Capillary refill takes less than 2 seconds.     Findings: No ecchymosis, erythema, rash or wound.  Neurological:     General: No focal deficit present.     Mental Status: She is alert and oriented to person, place, and time.     GCS: GCS eye subscore is 4. GCS verbal subscore is 5. GCS motor subscore is 6.     Cranial Nerves: Cranial nerves 2-12 are intact.     Sensory: Sensation is intact.     Motor: Motor function is intact.     Coordination: Coordination is intact.  Psychiatric:        Attention and Perception: Attention normal.        Mood and Affect: Mood normal.        Speech: Speech normal.        Behavior: Behavior normal.     ED Results / Procedures / Treatments   Labs (all labs ordered are listed, but only abnormal results are displayed) Labs Reviewed - No data to  display  EKG None  Radiology No results found.  Procedures Procedures    Medications Ordered in ED Medications - No data to display  ED Course/ Medical Decision Making/ A&P                                 Medical Decision Making  Patient presents to the ER with musculoskeletal back pain. Examination reveals back tenderness without any associated neurologic findings. Patient's strength, sensation and reflexes were normal. There is no evidence of saddle anesthesia. Patient does not have a foot drop. Patient has not experienced any change in bowel or bladder function. As such, patient did not require any imaging or further studies. Patient was treated with analgesia.        Final Clinical Impression(s) / ED Diagnoses Final diagnoses:  Bilateral sciatica    Rx / DC Orders ED Discharge Orders          Ordered    HYDROcodone -acetaminophen  (NORCO/VICODIN) 5-325 MG tablet  Every 4 hours PRN        01/10/24 0443              Ballard Bongo, MD 01/10/24 [phone]

## 2024-01-10 NOTE — ED Triage Notes (Signed)
 Pt c/o lower back pain, bilateral leg pain, and left arm pain that started yesterday. Was seen here on 5/28 and diagnosed with sciatica.

## 2024-01-11 MED FILL — Hydrocodone-Acetaminophen Tab 5-325 MG: ORAL | Qty: 6 | Status: AC

## 2024-01-19 ENCOUNTER — Other Ambulatory Visit: Payer: Self-pay

## 2024-01-19 ENCOUNTER — Emergency Department (HOSPITAL_COMMUNITY)
Admission: EM | Admit: 2024-01-19 | Discharge: 2024-01-19 | Disposition: A | Discharge status: Home / Self Care | Attending: Emergency Medicine | Admitting: Emergency Medicine

## 2024-01-19 ENCOUNTER — Encounter (HOSPITAL_COMMUNITY): Payer: Self-pay | Admitting: Emergency Medicine

## 2024-01-19 DIAGNOSIS — G8929 Other chronic pain: Secondary | ICD-10-CM | POA: Diagnosis not present

## 2024-01-19 DIAGNOSIS — M5440 Lumbago with sciatica, unspecified side: Secondary | ICD-10-CM | POA: Insufficient documentation

## 2024-01-19 LAB — PREGNANCY, URINE: Preg Test, Ur: NEGATIVE

## 2024-01-19 MED ORDER — CYCLOBENZAPRINE HCL 10 MG PO TABS: 10.0000 mg | ORAL_TABLET | Freq: Two times a day (BID) | ORAL | 0 refills | Status: DC | PRN

## 2024-01-19 MED ORDER — METHYLPREDNISOLONE SODIUM SUCC 40 MG IJ SOLR
40.0000 mg | INTRAMUSCULAR | Status: AC
  Administered 2024-01-19: 40 mg via INTRAMUSCULAR
  Filled 2024-01-19: qty 1

## 2024-01-19 MED ORDER — KETOROLAC TROMETHAMINE 15 MG/ML IJ SOLN
15.0000 mg | Freq: Once | INTRAMUSCULAR | Status: AC
  Administered 2024-01-19: 15 mg via INTRAMUSCULAR
  Filled 2024-01-19: qty 1

## 2024-01-19 MED ORDER — METHYLPREDNISOLONE 4 MG PO TBPK: ORAL_TABLET | ORAL | 0 refills | Status: DC

## 2024-01-19 MED ORDER — HYDROCODONE-ACETAMINOPHEN 5-325 MG PO TABS
1.0000 | ORAL_TABLET | Freq: Once | ORAL | Status: AC
  Administered 2024-01-19: 1 via ORAL
  Filled 2024-01-19: qty 1

## 2024-01-19 NOTE — ED Triage Notes (Addendum)
 C/o back pain from neck down to tailbone including shoulders. Pt states pain goes into bilateral legs including numbing sensation while standing long periods of time. Pt states been going on for weeks now. Pt states pain is effecting sleep.

## 2024-01-19 NOTE — ED Notes (Signed)
 ED Provider at bedside.

## 2024-01-19 NOTE — ED Provider Notes (Signed)
  [place] EMERGENCY DEPARTMENT AT [facility] Provider Note   CSN: [id number] Arrival date & time: 01/19/24  2040     History {Add pertinent medical, surgical, social history, OB history to HPI:1} Chief Complaint  Patient presents with   Back Pain    Jessica [name] is a 33 y.o. female.  Months going on Back of head down arms and legs Robaxin  Ibup and tylenol  Chronic back pain No IV drug use or surgeries       Home Medications Prior to Admission medications   Medication Sig Start Date End Date Taking? Authorizing Provider  meloxicam  (MOBIC ) 15 MG tablet Take 15 mg by mouth daily. 12/26/23  Yes [provider]  cyclobenzaprine  (FLEXERIL ) 10 MG tablet Take 1 tablet (10 mg total) by mouth 2 (two) times daily as needed for muscle spasms. 01/06/24   Ninetta Basket, MD  HYDROcodone -acetaminophen  (NORCO/VICODIN) 5-325 MG tablet Take 1 tablet by mouth every 4 (four) hours as needed. 01/10/24   Ballard Bongo, MD  methylPREDNISolone  (MEDROL  DOSEPAK) 4 MG TBPK tablet Take as directed on packaging 01/06/24   Ninetta Basket, MD  PARoxetine  (PAXIL ) 10 MG tablet Take 1 tablet (10 mg total) by mouth daily. 03/19/23 03/18/24  Javan Messing, NP      Allergies    Atomoxetine , Atarax  [hydroxyzine ], and Trazodone  and nefazodone    Review of Systems   Review of Systems  Physical Exam Updated Vital Signs Pulse (!) 58   Temp 97.8 F (36.6 C) (Oral)   Resp 17   Ht 5\' 2"  (1.575 m)   Wt 61.2 kg   LMP 01/10/2024 (Exact Date)   SpO2 97%   BMI 24.69 kg/m  Physical Exam  ED Results / Procedures / Treatments   Labs (all labs ordered are listed, but only abnormal results are displayed) Labs Reviewed - No data to display  EKG None  Radiology No results found.  Procedures Procedures  {Document cardiac monitor, telemetry assessment procedure when appropriate:1}  Medications Ordered in ED Medications - No data to display  ED Course/ Medical  Decision Making/ A&P   {   Click here for ABCD2, HEART and other calculatorsREFRESH Note before signing :1}                              Medical Decision Making Amount and/or Complexity of Data Reviewed Labs: ordered.  Risk Prescription drug management.   ***  {Document critical care time when appropriate:1} {Document review of labs and clinical decision tools ie heart score, Chads2Vasc2 etc:1}  {Document your independent review of radiology images, and any outside records:1} {Document your discussion with family members, caretakers, and with consultants:1} {Document social determinants of health affecting pt's care:1} {Document your decision making why or why not admission, treatments were needed:1} Final Clinical Impression(s) / ED Diagnoses Final diagnoses:  None    Rx / DC Orders ED Discharge Orders     None

## 2024-01-19 NOTE — Discharge Instructions (Addendum)
 You were seen for your back pain in the emergency department.   At home, please use over-the-counter Tylenol , ibuprofen , and lidocaine  patches.  You may also use the cyclobenzaprine  we have prescribed you as needed for pain.  Do not take the cyclobenzaprine  before driving or operating heavy machinery. Please also take the steroids we have given you.   Follow-up with your primary doctor in 2-3 days regarding your visit.  Follow-up with the spine clinic as soon as possible regarding your symptoms.  Return immediately to the emergency department if you experience any of the following: Numbness or weakness of your legs, bowel or bladder incontinence, numbness while wiping after pooping or urinating, or any other concerning symptoms.    Thank you for visiting our Emergency Department. It was a pleasure taking care of you today.

## 2024-01-19 NOTE — ED Notes (Signed)
 Pt ambulated to the bathroom with child.

## 2024-02-04 ENCOUNTER — Encounter (HOSPITAL_COMMUNITY): Payer: Self-pay

## 2024-02-04 ENCOUNTER — Other Ambulatory Visit: Payer: Self-pay

## 2024-02-04 ENCOUNTER — Emergency Department (HOSPITAL_COMMUNITY)
Admission: EM | Admit: 2024-02-04 | Discharge: 2024-02-04 | Disposition: A | Discharge status: Home / Self Care | Attending: Student | Admitting: Student

## 2024-02-04 DIAGNOSIS — G8929 Other chronic pain: Secondary | ICD-10-CM | POA: Insufficient documentation

## 2024-02-04 DIAGNOSIS — M545 Low back pain, unspecified: Secondary | ICD-10-CM | POA: Insufficient documentation

## 2024-02-04 DIAGNOSIS — F17210 Nicotine dependence, cigarettes, uncomplicated: Secondary | ICD-10-CM | POA: Insufficient documentation

## 2024-02-04 DIAGNOSIS — F419 Anxiety disorder, unspecified: Secondary | ICD-10-CM | POA: Diagnosis not present

## 2024-02-04 LAB — RAPID URINE DRUG SCREEN, HOSP PERFORMED
Amphetamines: NOT DETECTED
Barbiturates: NOT DETECTED
Benzodiazepines: POSITIVE — AB
Cocaine: NOT DETECTED
Opiates: NOT DETECTED
Tetrahydrocannabinol: POSITIVE — AB

## 2024-02-04 LAB — URINALYSIS, ROUTINE W REFLEX MICROSCOPIC
Bilirubin Urine: NEGATIVE
Glucose, UA: NEGATIVE mg/dL
Hgb urine dipstick: NEGATIVE
Ketones, ur: NEGATIVE mg/dL
Leukocytes,Ua: NEGATIVE
Nitrite: NEGATIVE
Protein, ur: NEGATIVE mg/dL
Specific Gravity, Urine: 1.016 (ref 1.005–1.030)
pH: 5 (ref 5.0–8.0)

## 2024-02-04 MED ORDER — LIDOCAINE 5 % EX PTCH
1.0000 | MEDICATED_PATCH | CUTANEOUS | Status: DC
  Administered 2024-02-04: 1 via TRANSDERMAL
  Filled 2024-02-04: qty 1

## 2024-02-04 MED ORDER — DIAZEPAM 5 MG PO TABS
2.5000 mg | ORAL_TABLET | Freq: Once | ORAL | Status: AC
  Administered 2024-02-04: 2.5 mg via ORAL
  Filled 2024-02-04: qty 1

## 2024-02-04 MED ORDER — OXYCODONE-ACETAMINOPHEN 5-325 MG PO TABS
1.0000 | ORAL_TABLET | Freq: Once | ORAL | Status: AC
  Administered 2024-02-04: 1 via ORAL
  Filled 2024-02-04: qty 1

## 2024-02-04 MED ORDER — NAPROXEN 250 MG PO TABS
500.0000 mg | ORAL_TABLET | Freq: Once | ORAL | Status: DC
  Filled 2024-02-04: qty 2

## 2024-02-04 MED ORDER — KETOROLAC TROMETHAMINE 15 MG/ML IJ SOLN
15.0000 mg | Freq: Once | INTRAMUSCULAR | Status: DC
  Filled 2024-02-04: qty 1

## 2024-02-04 MED ORDER — LORAZEPAM 1 MG PO TABS
1.0000 mg | ORAL_TABLET | Freq: Once | ORAL | Status: AC
  Administered 2024-02-04: 1 mg via ORAL
  Filled 2024-02-04: qty 1

## 2024-02-04 MED ORDER — DIAZEPAM 5 MG PO TABS
5.0000 mg | ORAL_TABLET | Freq: Once | ORAL | Status: AC
  Administered 2024-02-04: 5 mg via ORAL
  Filled 2024-02-04: qty 1

## 2024-02-04 NOTE — ED Triage Notes (Signed)
 Pt arrived via POV c/o panic attack that is making it difficult for her to breathe. Pt reports thinking of the passing of her grandmother recently passing away has made her nauseated and stressed as well.
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
## 2024-02-04 NOTE — ED Notes (Signed)
Patient aware that we need a urine specimen 

## 2024-02-07 ENCOUNTER — Emergency Department (HOSPITAL_COMMUNITY)

## 2024-02-07 ENCOUNTER — Other Ambulatory Visit: Payer: Self-pay

## 2024-02-07 ENCOUNTER — Emergency Department (HOSPITAL_COMMUNITY)
Admission: EM | Admit: 2024-02-07 | Discharge: 2024-02-07 | Discharge status: Against Medical Advice | Attending: Emergency Medicine | Admitting: Emergency Medicine

## 2024-02-07 ENCOUNTER — Encounter (HOSPITAL_COMMUNITY): Payer: Self-pay | Admitting: Emergency Medicine

## 2024-02-07 DIAGNOSIS — R519 Headache, unspecified: Secondary | ICD-10-CM | POA: Diagnosis present

## 2024-02-07 DIAGNOSIS — R079 Chest pain, unspecified: Secondary | ICD-10-CM | POA: Diagnosis not present

## 2024-02-07 DIAGNOSIS — R112 Nausea with vomiting, unspecified: Secondary | ICD-10-CM | POA: Diagnosis not present

## 2024-02-07 DIAGNOSIS — Z765 Malingerer [conscious simulation]: Secondary | ICD-10-CM

## 2024-02-07 DIAGNOSIS — Z5329 Procedure and treatment not carried out because of patient's decision for other reasons: Secondary | ICD-10-CM | POA: Insufficient documentation

## 2024-02-07 DIAGNOSIS — F419 Anxiety disorder, unspecified: Secondary | ICD-10-CM | POA: Insufficient documentation

## 2024-02-07 LAB — CBC WITH DIFFERENTIAL/PLATELET
Abs Immature Granulocytes: 0.02 10*3/uL (ref 0.00–0.07)
Basophils Absolute: 0 10*3/uL (ref 0.0–0.1)
Basophils Relative: 1 %
Eosinophils Absolute: 0 10*3/uL (ref 0.0–0.5)
Eosinophils Relative: 0 %
HCT: 40.7 % (ref 36.0–46.0)
Hemoglobin: 14.2 g/dL (ref 12.0–15.0)
Immature Granulocytes: 0 %
Lymphocytes Relative: 21 %
Lymphs Abs: 1.4 10*3/uL (ref 0.7–4.0)
MCH: 30.1 pg (ref 26.0–34.0)
MCHC: 34.9 g/dL (ref 30.0–36.0)
MCV: 86.2 fL (ref 80.0–100.0)
Monocytes Absolute: 0.3 10*3/uL (ref 0.1–1.0)
Monocytes Relative: 5 %
Neutro Abs: 5 10*3/uL (ref 1.7–7.7)
Neutrophils Relative %: 73 %
Platelets: 285 10*3/uL (ref 150–400)
RBC: 4.72 MIL/uL (ref 3.87–5.11)
RDW: 12.8 % (ref 11.5–15.5)
WBC: 6.8 10*3/uL (ref 4.0–10.5)
nRBC: 0 % (ref 0.0–0.2)

## 2024-02-07 LAB — URINALYSIS, ROUTINE W REFLEX MICROSCOPIC
Bacteria, UA: NONE SEEN
Bilirubin Urine: NEGATIVE
Glucose, UA: NEGATIVE mg/dL
Ketones, ur: NEGATIVE mg/dL
Leukocytes,Ua: NEGATIVE
Nitrite: NEGATIVE
Protein, ur: NEGATIVE mg/dL
Specific Gravity, Urine: 1.005 (ref 1.005–1.030)
pH: 9 — ABNORMAL HIGH (ref 5.0–8.0)

## 2024-02-07 LAB — BASIC METABOLIC PANEL WITH GFR
Anion gap: 12 (ref 5–15)
BUN: 6 mg/dL (ref 6–20)
CO2: 19 mmol/L — ABNORMAL LOW (ref 22–32)
Calcium: 8.8 mg/dL — ABNORMAL LOW (ref 8.9–10.3)
Chloride: 108 mmol/L (ref 98–111)
Creatinine, Ser: 0.92 mg/dL (ref 0.44–1.00)
GFR, Estimated: >60 mL/min (ref >60)
Glucose, Bld: 103 mg/dL — ABNORMAL HIGH (ref 70–99)
Potassium: 3.7 mmol/L (ref 3.5–5.1)
Sodium: 139 mmol/L (ref 135–145)

## 2024-02-07 LAB — TROPONIN I (HIGH SENSITIVITY): Troponin I (High Sensitivity): <2 ng/L (ref <18)

## 2024-02-07 MED ORDER — HALOPERIDOL LACTATE 5 MG/ML IJ SOLN
5.0000 mg | Freq: Once | INTRAMUSCULAR | Status: DC
  Filled 2024-02-07: qty 1

## 2024-02-07 MED ORDER — METOCLOPRAMIDE HCL 5 MG/ML IJ SOLN
10.0000 mg | Freq: Once | INTRAMUSCULAR | Status: AC
  Administered 2024-02-07: 10 mg via INTRAVENOUS
  Filled 2024-02-07: qty 2

## 2024-02-07 NOTE — ED Provider Notes (Signed)
 [place] EMERGENCY DEPARTMENT AT [facility] Provider Note   CSN: [id number] Arrival date & time: 02/07/24  1352     Patient presents with: Chest Pain and Headache   Jessica [name] is a 33 y.o. female past medical history significant for chronic back pain, amphetamine abuse, anxiety, depression, cannabis abuse, migraine headache, and chronic pain presents today for headache, anxiety and chest pain x 3 days.  Patient states she has been unable to keep anything down due to her headache which is worse than her usual.  Patient reports chest pain that radiates to her bilateral jaw and left arm.  Patient endorses nausea, vomiting, photophobia, and phonophobia.  Patient denies shortness of breath, blurred vision, tinnitus, hematemesis, abdominal pain, fever, chills, numbness, weakness, or injury.    Chest Pain Associated symptoms: headache   Headache Associated symptoms: photophobia        Prior to Admission medications   Medication Sig Start Date End Date Taking? Authorizing Provider  cyclobenzaprine  (FLEXERIL ) 10 MG tablet Take 1 tablet (10 mg total) by mouth 2 (two) times daily as needed for muscle spasms. 01/19/24   Yolande Lamar BROCKS, MD  HYDROcodone -acetaminophen  (NORCO/VICODIN) 5-325 MG tablet Take 1 tablet by mouth every 4 (four) hours as needed. 01/10/24   Haze Lonni PARAS, MD  meloxicam  (MOBIC ) 15 MG tablet Take 15 mg by mouth daily. 12/26/23   [provider]  methylPREDNISolone  (MEDROL  DOSEPAK) 4 MG TBPK tablet Take as directed on packaging 01/19/24   Yolande Lamar BROCKS, MD  PARoxetine  (PAXIL ) 10 MG tablet Take 1 tablet (10 mg total) by mouth daily. 03/19/23 03/18/24  Signa Delon LABOR, NP    Allergies: Atomoxetine , Atarax  [hydroxyzine ], and Trazodone  and nefazodone    Review of Systems  Eyes:  Positive for photophobia.  Cardiovascular:  Positive for chest pain.  Neurological:  Positive for headaches.  Psychiatric/Behavioral:  The patient is  nervous/anxious.     Updated Vital Signs BP 116/74   Pulse (!) 59   Temp 98.3 F (36.8 C) (Oral)   Resp 10   Ht 5' 2 (1.575 m)   Wt 61 kg   LMP 02/07/2024 (Approximate)   SpO2 100%   BMI 24.60 kg/m   Physical Exam Vitals and nursing note reviewed.  Constitutional:      General: She is not in acute distress.    Appearance: She is well-developed. She is not ill-appearing or diaphoretic.  HENT:     Head: Normocephalic and atraumatic.   Eyes:     Conjunctiva/sclera: Conjunctivae normal.     Pupils: Pupils are equal, round, and reactive to light.    Cardiovascular:     Rate and Rhythm: Normal rate and regular rhythm.     Heart sounds: Normal heart sounds. No murmur heard. Pulmonary:     Effort: Pulmonary effort is normal. No respiratory distress.     Breath sounds: Normal breath sounds.  Abdominal:     Palpations: Abdomen is soft.     Tenderness: There is no abdominal tenderness.   Musculoskeletal:        General: No swelling.     Cervical back: Neck supple.     Right lower leg: No tenderness. No edema.     Left lower leg: No tenderness. No edema.   Skin:    General: Skin is warm and dry.     Capillary Refill: Capillary refill takes less than 2 seconds.   Neurological:     General: No focal deficit present.  Mental Status: She is alert and oriented to person, place, and time.     Cranial Nerves: No cranial nerve deficit.     Motor: No weakness.   Psychiatric:        Mood and Affect: Mood is anxious.     (all labs ordered are listed, but only abnormal results are displayed) Labs Reviewed  BASIC METABOLIC PANEL WITH GFR - Abnormal; Notable for the following components:      Result Value   CO2 19 (*)    Glucose, Bld 103 (*)    Calcium  8.8 (*)    All other components within normal limits  URINALYSIS, ROUTINE W REFLEX MICROSCOPIC - Abnormal; Notable for the following components:   Color, Urine STRAW (*)    pH 9.0 (*)    Hgb urine dipstick LARGE (*)     All other components within normal limits  CBC WITH DIFFERENTIAL/PLATELET  TROPONIN I (HIGH SENSITIVITY)  TROPONIN I (HIGH SENSITIVITY)    EKG: None  Radiology: No results found.   Procedures   Medications Ordered in the ED  haloperidol lactate (HALDOL) injection 5 mg (5 mg Intravenous Patient Refused/Not Given 02/07/24 1447)  metoCLOPramide  (REGLAN ) injection 10 mg (10 mg Intravenous Given 02/07/24 1448)                                    Medical Decision Making Amount and/or Complexity of Data Reviewed Labs: ordered.  Risk Prescription drug management.   This patient presents to the ED for concern of chest pain and headache, this involves an extensive number of treatment options, and is a complaint that carries with it a high risk of complications and morbidity.  The differential diagnosis includes brain bleed, STEMI, NSTEMI, anxiety, GERD, arrhythmia, electrolyte abnormality, headache   Additional history obtained:  Additional history obtained from EMR External records from outside source obtained and reviewed including OB/GYN notes   Lab Tests:  I Ordered, and personally interpreted labs.  The pertinent results include: CBC WNL, BMP mildly decreased CO2, mildly decreased calcium , troponin less than 2, UA with large hemoglobin and elevated pH   Imaging Studies ordered:  I ordered imaging studies including CT head Noncon I independently visualized and interpreted imaging which showed  I agree with the radiologist interpretation   Cardiac Monitoring: / EKG:  The patient was maintained on a cardiac monitor.  I personally viewed and interpreted the cardiac monitored which showed an underlying rhythm of: Sinus rhythm   Problem List / ED Course / Critical interventions / Medication management  I ordered medication including Reglan  and Haldol Reevaluation of the patient after these medicines showed that the patient  I have reviewed the patients home medicines  and have made adjustments as needed Patient refused Haldol and claims she is a allergic to it.  Per patient's chart and care everywhere patient has never received Haldol or claimed to have an allergy of it.  Patient left AMA prior to full ED workup and prior to this provider being able to discuss risks of leaving prior to full ED evaluation.     Final diagnoses:  None    ED Discharge Orders     None          Francis Ileana LOISE DEVONNA 02/07/24 1548    Suzette Pac, MD 02/08/24 1019

## 2024-02-07 NOTE — ED Triage Notes (Signed)
 Pt arrives via EMS with reports of HA and CP for 3 days. States unable to keep anything down from migraine. CP began to radiate to jaw and left arm.

## 2024-03-09 ENCOUNTER — Other Ambulatory Visit: Payer: Self-pay

## 2024-03-09 ENCOUNTER — Emergency Department (HOSPITAL_COMMUNITY)
Admission: EM | Admit: 2024-03-09 | Discharge: 2024-03-09 | Disposition: A | Discharge status: Home / Self Care | Attending: Emergency Medicine | Admitting: Emergency Medicine

## 2024-03-09 ENCOUNTER — Encounter (HOSPITAL_COMMUNITY): Payer: Self-pay | Admitting: Emergency Medicine

## 2024-03-09 DIAGNOSIS — F419 Anxiety disorder, unspecified: Secondary | ICD-10-CM | POA: Insufficient documentation

## 2024-03-09 MED ORDER — ALPRAZOLAM 0.5 MG PO TABS
0.5000 mg | ORAL_TABLET | Freq: Once | ORAL | Status: AC
  Administered 2024-03-09: 0.5 mg via ORAL
  Filled 2024-03-09: qty 1

## 2024-03-09 MED ORDER — ALPRAZOLAM 0.5 MG PO TABS: 0.5000 mg | ORAL_TABLET | Freq: Three times a day (TID) | ORAL | 0 refills | Status: DC | PRN

## 2024-03-09 NOTE — ED Triage Notes (Signed)
 Pt arrived to ED c/o anxiety. Pt states she has had anxiety for years and was prescribed xanax  in the past.
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
## 2024-03-09 NOTE — Discharge Instructions (Addendum)
 Begin taking Xanax  as prescribed as needed for anxiety.  Follow-up with your primary doctor if symptoms persist.

## 2024-04-01 ENCOUNTER — Encounter: Payer: Self-pay | Admitting: Radiology

## 2024-04-28 ENCOUNTER — Emergency Department (HOSPITAL_COMMUNITY)

## 2024-04-28 ENCOUNTER — Emergency Department (HOSPITAL_COMMUNITY)
Admission: EM | Admit: 2024-04-28 | Discharge: 2024-04-28 | Disposition: A | Discharge status: Home / Self Care | Attending: Emergency Medicine | Admitting: Emergency Medicine

## 2024-04-28 ENCOUNTER — Other Ambulatory Visit: Payer: Self-pay

## 2024-04-28 ENCOUNTER — Encounter (HOSPITAL_COMMUNITY): Payer: Self-pay

## 2024-04-28 DIAGNOSIS — Z23 Encounter for immunization: Secondary | ICD-10-CM | POA: Insufficient documentation

## 2024-04-28 DIAGNOSIS — S71132A Puncture wound without foreign body, left thigh, initial encounter: Secondary | ICD-10-CM | POA: Diagnosis present

## 2024-04-28 DIAGNOSIS — W268XXA Contact with other sharp object(s), not elsewhere classified, initial encounter: Secondary | ICD-10-CM | POA: Diagnosis not present

## 2024-04-28 DIAGNOSIS — S71131A Puncture wound without foreign body, right thigh, initial encounter: Secondary | ICD-10-CM

## 2024-04-28 MED ORDER — HYDROCODONE-ACETAMINOPHEN 5-325 MG PO TABS
1.0000 | ORAL_TABLET | Freq: Once | ORAL | Status: AC
Start: 2024-04-28 — End: 2024-04-28
  Administered 2024-04-28: 1 via ORAL
  Filled 2024-04-28: qty 1

## 2024-04-28 MED ORDER — CEPHALEXIN 500 MG PO CAPS
500.0000 mg | ORAL_CAPSULE | Freq: Three times a day (TID) | ORAL | 0 refills | Status: AC
Start: 2024-04-28 — End: 2024-05-03

## 2024-04-28 MED ORDER — TETANUS-DIPHTH-ACELL PERTUSSIS 5-2.5-18.5 LF-MCG/0.5 IM SUSY
0.5000 mL | PREFILLED_SYRINGE | Freq: Once | INTRAMUSCULAR | Status: AC
  Administered 2024-04-28: 0.5 mL via INTRAMUSCULAR
  Filled 2024-04-28: qty 0.5

## 2024-04-28 MED ORDER — ACETAMINOPHEN 500 MG PO TABS: 1000.0000 mg | ORAL_TABLET | Freq: Once | ORAL | Status: DC

## 2024-04-28 MED ORDER — LIDOCAINE-EPINEPHRINE-TETRACAINE (LET) TOPICAL GEL
3.0000 mL | Freq: Once | TOPICAL | Status: AC
  Administered 2024-04-28: 3 mL via TOPICAL
  Filled 2024-04-28: qty 3

## 2024-04-28 MED ORDER — IBUPROFEN 400 MG PO TABS
600.0000 mg | ORAL_TABLET | Freq: Once | ORAL | Status: AC
  Administered 2024-04-28: 600 mg via ORAL
  Filled 2024-04-28: qty 2

## 2024-04-28 NOTE — ED Provider Notes (Signed)
 [place] EMERGENCY DEPARTMENT AT [facility] Provider Note   CSN: [id number] Arrival date & time: 04/28/24  1548     Patient presents with: Laceration   Jessica [name] is a 33 y.o. female.  To the ER today with a laceration left lateral thigh, she was trying to unload scrap metal from a trailer, when she tried to step over a piece of metal it punctured her left thigh and she is having pain.  Unsure of the date of last tetanus.  Denies any numbness or tingling, no other injuries.  Unsure of possible foreign body.  She did shower at home and has been able to walk but states it is painful to walk on the leg.    Laceration      Prior to Admission medications   Medication Sig Start Date End Date Taking? Authorizing Provider  ALPRAZolam  (XANAX ) 0.5 MG tablet Take 1 tablet (0.5 mg total) by mouth 3 (three) times daily as needed for anxiety. 03/09/24   Geroldine Berg, MD  cyclobenzaprine  (FLEXERIL ) 10 MG tablet Take 1 tablet (10 mg total) by mouth 2 (two) times daily as needed for muscle spasms. 01/19/24   Yolande Lamar BROCKS, MD  HYDROcodone -acetaminophen  (NORCO/VICODIN) 5-325 MG tablet Take 1 tablet by mouth every 4 (four) hours as needed. 01/10/24   Haze Lonni PARAS, MD  meloxicam  (MOBIC ) 15 MG tablet Take 15 mg by mouth daily. 12/26/23   [provider]  methylPREDNISolone  (MEDROL  DOSEPAK) 4 MG TBPK tablet Take as directed on packaging 01/19/24   Yolande Lamar BROCKS, MD  PARoxetine  (PAXIL ) 10 MG tablet Take 1 tablet (10 mg total) by mouth daily. 03/19/23 03/18/24  Signa Delon LABOR, NP    Allergies: Atomoxetine , Atarax  [hydroxyzine ], and Trazodone  and nefazodone    Review of Systems  Updated Vital Signs BP (!) 137/97 (BP Location: Right Arm)   Pulse 92   Temp 98.3 F (36.8 C) (Oral)   Resp 18   Ht 5' 2 (1.575 m)   Wt 54.4 kg   LMP 04/27/2024 (Exact Date)   SpO2 99%   BMI 21.95 kg/m   Physical Exam Vitals and nursing note reviewed.  Constitutional:       General: She is not in acute distress.    Appearance: She is well-developed.  HENT:     Head: Normocephalic and atraumatic.  Eyes:     Conjunctiva/sclera: Conjunctivae normal.  Cardiovascular:     Rate and Rhythm: Normal rate and regular rhythm.     Heart sounds: No murmur heard. Pulmonary:     Effort: Pulmonary effort is normal. No respiratory distress.     Breath sounds: Normal breath sounds.  Abdominal:     Palpations: Abdomen is soft.     Tenderness: There is no abdominal tenderness.  Musculoskeletal:        General: No swelling.     Cervical back: Neck supple.  Skin:    General: Skin is warm and dry.     Capillary Refill: Capillary refill takes less than 2 seconds.     Comments: There is approximately 3 cm abrasion with a focal puncture to the left lateral thigh without any active bleeding.  Neurological:     General: No focal deficit present.     Mental Status: She is alert and oriented to person, place, and time.  Psychiatric:        Mood and Affect: Mood normal.     (all labs ordered are listed, but only abnormal results are  displayed) Labs Reviewed - No data to display  EKG: None  Radiology: No results found.   Procedures   Medications Ordered in the ED - No data to display                                  Medical Decision Making Differential diagnose includes but not limited to abrasion, laceration, contusion, foreign body, cellulitis, other  Course: Patient had a puncture wound to the left lateral thigh with a piece of scrap metal today when she was stepping over it on a trailer.  She went home and showered but stated it was still very painful and she was worried about the presence of a foreign body so she decided to come to the ER for further evaluation.  Tetanus shot was updated today, she was given ibuprofen  for pain and topical LET, but she continued to c/o a lot of discomfort so given dose of Norco.  Wound was irrigated copiously.  X-ray showed no  foreign body or other acute abnormality discussed that this was a very small puncture and no indication for suturing bleeding is controlled this will heal by secondary intention.  Discussed that suturing of puncture wound can increase risk of infection.  Will put her on antibiotics for infection prophylaxis.  She is advised on wound care follow-up and return precautions.  Amount and/or Complexity of Data Reviewed External Data Reviewed: notes. Radiology: ordered and independent interpretation performed.    Details: X-ray left femur shows no foreign body or osseous abnormalities, agree with radiology read  Risk Prescription drug management.        Final diagnoses:  None    ED Discharge Orders     None          Suellen Sherran LABOR, PA-C 04/28/24 1906    Elnor Jayson LABOR, DO 05/04/24 [phone]

## 2024-04-28 NOTE — Discharge Instructions (Addendum)
 Pleasure taking care of you today.  You are seen for a puncture wound to your thigh.  Your x-ray did not show any foreign body or damage to the bone.  We cleaned the wound and are started on antibiotics, we also updated your tetanus shot.  Take over-the-counter Tylenol  and ibuprofen  as directed on packaging as needed for pain, keep the wound clean and dry and make sure you take the antibiotics to help prevent infection.  Come back to the ER for new or worsening symptoms.

## 2024-04-28 NOTE — ED Triage Notes (Signed)
 Pt arrived via POV from home c/o 3.5cm laceration to left lateral thigh. Pt reports she fell off of a trailer onto scrap metal. Pt reports there may be metal still inside her wound. Bleeding under control at this time.

## 2024-05-12 ENCOUNTER — Other Ambulatory Visit: Payer: Self-pay

## 2024-05-12 ENCOUNTER — Emergency Department (HOSPITAL_COMMUNITY)
Admission: EM | Admit: 2024-05-12 | Discharge: 2024-05-12 | Disposition: A | Discharge status: Home / Self Care | Attending: Emergency Medicine | Admitting: Emergency Medicine

## 2024-05-12 ENCOUNTER — Emergency Department (HOSPITAL_COMMUNITY)

## 2024-05-12 ENCOUNTER — Encounter (HOSPITAL_COMMUNITY): Payer: Self-pay

## 2024-05-12 DIAGNOSIS — M5416 Radiculopathy, lumbar region: Secondary | ICD-10-CM | POA: Insufficient documentation

## 2024-05-12 DIAGNOSIS — M545 Low back pain, unspecified: Secondary | ICD-10-CM | POA: Diagnosis present

## 2024-05-12 LAB — CBC
HCT: 35.1 % — ABNORMAL LOW (ref 36.0–46.0)
Hemoglobin: 11.3 g/dL — ABNORMAL LOW (ref 12.0–15.0)
MCH: 28 pg (ref 26.0–34.0)
MCHC: 32.2 g/dL (ref 30.0–36.0)
MCV: 87.1 fL (ref 80.0–100.0)
Platelets: 240 K/uL (ref 150–400)
RBC: 4.03 MIL/uL (ref 3.87–5.11)
RDW: 14.8 % (ref 11.5–15.5)
WBC: 6.8 K/uL (ref 4.0–10.5)
nRBC: 0 % (ref 0.0–0.2)

## 2024-05-12 LAB — URINALYSIS, ROUTINE W REFLEX MICROSCOPIC
Bilirubin Urine: NEGATIVE
Glucose, UA: NEGATIVE mg/dL
Hgb urine dipstick: NEGATIVE
Ketones, ur: NEGATIVE mg/dL
Leukocytes,Ua: NEGATIVE
Nitrite: NEGATIVE
Protein, ur: NEGATIVE mg/dL
Specific Gravity, Urine: 1.014 (ref 1.005–1.030)
pH: 7 (ref 5.0–8.0)

## 2024-05-12 LAB — BASIC METABOLIC PANEL WITH GFR
Anion gap: 10 (ref 5–15)
BUN: 17 mg/dL (ref 6–20)
CO2: 24 mmol/L (ref 22–32)
Calcium: 8.9 mg/dL (ref 8.9–10.3)
Chloride: 106 mmol/L (ref 98–111)
Creatinine, Ser: 1.03 mg/dL — ABNORMAL HIGH (ref 0.44–1.00)
GFR, Estimated: >60 mL/min (ref >60)
Glucose, Bld: 118 mg/dL — ABNORMAL HIGH (ref 70–99)
Potassium: 4 mmol/L (ref 3.5–5.1)
Sodium: 141 mmol/L (ref 135–145)

## 2024-05-12 LAB — POC URINE PREG, ED: Preg Test, Ur: NEGATIVE

## 2024-05-12 MED ORDER — PREDNISONE 50 MG PO TABS
60.0000 mg | ORAL_TABLET | Freq: Once | ORAL | Status: AC
  Administered 2024-05-12: 60 mg via ORAL
  Filled 2024-05-12: qty 1

## 2024-05-12 MED ORDER — OXYCODONE-ACETAMINOPHEN 5-325 MG PO TABS
1.0000 | ORAL_TABLET | Freq: Once | ORAL | Status: AC
  Administered 2024-05-12: 1 via ORAL
  Filled 2024-05-12: qty 1

## 2024-05-12 MED ORDER — LIDOCAINE 5 % EX PTCH: 1.0000 | MEDICATED_PATCH | CUTANEOUS | 0 refills | Status: DC

## 2024-05-12 MED ORDER — METHOCARBAMOL 500 MG PO TABS
500.0000 mg | ORAL_TABLET | Freq: Three times a day (TID) | ORAL | 0 refills | Status: DC | PRN
Start: 2024-05-12 — End: 2024-06-22

## 2024-05-12 MED ORDER — PREDNISONE 20 MG PO TABS: ORAL_TABLET | ORAL | 0 refills | Status: DC

## 2024-05-12 NOTE — ED Notes (Signed)
 Patient transported to CT

## 2024-05-12 NOTE — ED Triage Notes (Signed)
 Ccems from home. Cc of bilateral flank pain for a couple weeks but worse tonight Said the pain is in her legs too.  Tried aleve  and tylenol  without success Hx of kidney stones.  Denies urinary s/s.  Ems gave 30mg  toradol  20l ac said it did not help 109/56 hr 74 100% RA

## 2024-05-12 NOTE — ED Provider Notes (Signed)
 [place] EMERGENCY DEPARTMENT AT [facility] Provider Note   CSN: [id number] Arrival date & time: 05/12/24  2041     Patient presents with: Flank Pain   Jessica [name] is a 33 y.o. female.   Presents with complaints of lower back pain radiating down both legs.  Symptoms ongoing for a couple of weeks, worse tonight.  Taking Aleve  and Tylenol  without improvement.       Prior to Admission medications   Medication Sig Start Date End Date Taking? Authorizing Provider  lidocaine  (LIDODERM ) 5 % Place 1 patch onto the skin daily. Remove & Discard patch within 12 hours or as directed by MD 05/12/24  Yes Ryden Wainer, Lonni PARAS, MD  methocarbamol  (ROBAXIN ) 500 MG tablet Take 1 tablet (500 mg total) by mouth every 8 (eight) hours as needed for muscle spasms. 05/12/24  Yes Kayin Kettering, Lonni PARAS, MD  predniSONE  (DELTASONE ) 20 MG tablet 3 tabs po daily x 3 days, then 2 tabs x 3 days, then 1.5 tabs x 3 days, then 1 tab x 3 days, then 0.5 tabs x 3 days 05/12/24  Yes Sieara Bremer, Lonni PARAS, MD  ALPRAZolam  (XANAX ) 0.5 MG tablet Take 1 tablet (0.5 mg total) by mouth 3 (three) times daily as needed for anxiety. 03/09/24   Geroldine Berg, MD  meloxicam  (MOBIC ) 15 MG tablet Take 15 mg by mouth daily. 12/26/23   [provider]  PARoxetine  (PAXIL ) 10 MG tablet Take 1 tablet (10 mg total) by mouth daily. 03/19/23 03/18/24  Signa Delon LABOR, NP    Allergies: Atomoxetine , Atarax  [hydroxyzine ], and Trazodone  and nefazodone    Review of Systems  Updated Vital Signs BP 105/61 (BP Location: Left Arm)   Pulse (!) 57   Temp 98.6 F (37 C) (Oral)   Resp 17   Ht 5' 2 (1.575 m)   Wt 54.4 kg   LMP 04/27/2024 (Exact Date)   SpO2 100%   BMI 21.95 kg/m   Physical Exam Vitals and nursing note reviewed.  Constitutional:      General: She is not in acute distress.    Appearance: She is well-developed.  HENT:     Head: Normocephalic and atraumatic.     Mouth/Throat:     Mouth: Mucous  membranes are moist.  Eyes:     General: Vision grossly intact. Gaze aligned appropriately.     Extraocular Movements: Extraocular movements intact.     Conjunctiva/sclera: Conjunctivae normal.  Cardiovascular:     Rate and Rhythm: Normal rate and regular rhythm.     Pulses: Normal pulses.     Heart sounds: Normal heart sounds, S1 normal and S2 normal. No murmur heard.    No friction rub. No gallop.  Pulmonary:     Effort: Pulmonary effort is normal. No respiratory distress.     Breath sounds: Normal breath sounds.  Abdominal:     General: Bowel sounds are normal.     Palpations: Abdomen is soft.     Tenderness: There is no abdominal tenderness. There is no guarding or rebound.     Hernia: No hernia is present.  Musculoskeletal:        General: No swelling.     Cervical back: Full passive range of motion without pain, normal range of motion and neck supple. No spinous process tenderness or muscular tenderness. Normal range of motion.     Lumbar back: Tenderness present. Positive right straight leg raise test and positive left straight leg raise test.  Right lower leg: No edema.     Left lower leg: No edema.  Skin:    General: Skin is warm and dry.     Capillary Refill: Capillary refill takes less than 2 seconds.     Findings: No ecchymosis, erythema, rash or wound.  Neurological:     General: No focal deficit present.     Mental Status: She is alert and oriented to person, place, and time.     GCS: GCS eye subscore is 4. GCS verbal subscore is 5. GCS motor subscore is 6.     Cranial Nerves: Cranial nerves 2-12 are intact.     Sensory: Sensation is intact.     Motor: Motor function is intact.     Coordination: Coordination is intact.  Psychiatric:        Attention and Perception: Attention normal.        Mood and Affect: Mood normal.        Speech: Speech normal.        Behavior: Behavior normal.     (all labs ordered are listed, but only abnormal results are  displayed) Labs Reviewed  URINALYSIS, ROUTINE W REFLEX MICROSCOPIC - Abnormal; Notable for the following components:      Result Value   APPearance HAZY (*)    All other components within normal limits  BASIC METABOLIC PANEL WITH GFR - Abnormal; Notable for the following components:   Glucose, Bld 118 (*)    Creatinine, Ser 1.03 (*)    All other components within normal limits  CBC - Abnormal; Notable for the following components:   Hemoglobin 11.3 (*)    HCT 35.1 (*)    All other components within normal limits  POC URINE PREG, ED    EKG: None  Radiology: CT Renal Stone Study Result Date: 05/12/2024 CLINICAL DATA:  Abdominal/flank pain, stone suspected EXAM: CT ABDOMEN AND PELVIS WITHOUT CONTRAST TECHNIQUE: Multidetector CT imaging of the abdomen and pelvis was performed following the standard protocol without IV contrast. RADIATION DOSE REDUCTION: This exam was performed according to the departmental dose-optimization program which includes automated exposure control, adjustment of the mA and/or kV according to patient size and/or use of iterative reconstruction technique. COMPARISON:  CT abdomen pelvis 12/20/2022 FINDINGS: Lower chest: No acute abnormality. Hepatobiliary: No focal liver abnormality. Contracted gallbladder. No gallstones, gallbladder wall thickening, or pericholecystic fluid. No biliary dilatation. Pancreas: No focal lesion. Normal pancreatic contour. No surrounding inflammatory changes. No main pancreatic ductal dilatation. Spleen: Normal in size without focal abnormality. Adrenals/Urinary Tract: No adrenal nodule bilaterally. No nephrolithiasis and no hydronephrosis. No definite contour-deforming renal mass. No ureterolithiasis or hydroureter. The urinary bladder is unremarkable. Stomach/Bowel: Stomach is within normal limits. No evidence of bowel wall thickening or dilatation. Appendix appears normal. Vascular/Lymphatic: No abdominal aorta or iliac aneurysm. No abdominal,  pelvic, or inguinal lymphadenopathy. Reproductive: Uterus and bilateral adnexa are unremarkable. Other: No intraperitoneal free fluid. No intraperitoneal free gas. No organized fluid collection. Musculoskeletal: No abdominal wall hernia or abnormality. No suspicious lytic or blastic osseous lesions. No acute displaced fracture. Bilateral L5 pars interarticularis defects. IMPRESSION: No acute intra-abdominal or intrapelvic abnormality with limited evaluation on this noncontrast study. Electronically Signed   By: Morgane  Naveau M.D.   On: 05/12/2024 23:07     Procedures   Medications Ordered in the ED  predniSONE  (DELTASONE ) tablet 60 mg (has no administration in time range)  oxyCODONE -acetaminophen  (PERCOCET/ROXICET) 5-325 MG per tablet 1 tablet (has no administration in time range)  Medical Decision Making Amount and/or Complexity of Data Reviewed Labs: ordered.  Risk Prescription drug management.   Patient presents to the ER with musculoskeletal back pain. Examination reveals back tenderness without any associated neurologic findings. Patient's strength, sensation and reflexes were normal. There is no evidence of saddle anesthesia. Patient does not have a foot drop. Patient has not experienced any change in bowel or bladder function.  Renal stone study does not show any signs of ureterolithiasis, no significant spinal abnormalities. Patient was treated with analgesia.     Final diagnoses:  Lumbar radiculopathy    ED Discharge Orders          Ordered    methocarbamol  (ROBAXIN ) 500 MG tablet  Every 8 hours PRN        05/12/24 2334    predniSONE  (DELTASONE ) 20 MG tablet        05/12/24 2334    lidocaine  (LIDODERM ) 5 %  Every 24 hours        05/12/24 2334               Haze Lonni PARAS, MD 05/12/24 2334

## 2024-05-15 ENCOUNTER — Emergency Department (HOSPITAL_COMMUNITY)
Admission: EM | Admit: 2024-05-15 | Discharge: 2024-05-15 | Disposition: A | Discharge status: Home / Self Care | Attending: Emergency Medicine | Admitting: Emergency Medicine

## 2024-05-15 ENCOUNTER — Other Ambulatory Visit: Payer: Self-pay

## 2024-05-15 ENCOUNTER — Encounter (HOSPITAL_COMMUNITY): Payer: Self-pay

## 2024-05-15 DIAGNOSIS — M545 Low back pain, unspecified: Secondary | ICD-10-CM | POA: Insufficient documentation

## 2024-05-15 MED ORDER — TRAMADOL HCL 50 MG PO TABS: 50.0000 mg | ORAL_TABLET | Freq: Four times a day (QID) | ORAL | 0 refills | Status: DC | PRN

## 2024-05-15 NOTE — ED Notes (Signed)
 Assumed care of pt, found her in the room in chair w/ son (who is also a pt.)  States that she was here recently for back pain but the medications she was given are not working.  Pt indicated that she has a 9/10 pain that starts in her lower back and then goes down both legs.  Pt is able to move and stand, no neuro deficits noted at this time.

## 2024-05-15 NOTE — ED Provider Notes (Signed)
 [place] EMERGENCY DEPARTMENT AT [facility] Provider Note   CSN: [id number] Arrival date & time: 05/15/24  2139     Patient presents with: Back Pain   Jessica [name] is a 33 y.o. female.   This patient is a 33 year old female presenting with low back pain.  This has been ongoing for several weeks.  Symptoms began in the absence of any injury or trauma.  She describes discomfort to her lumbar region that radiates into her legs.  She denies any weakness or numbness.  No bowel or bladder complaints.  She was seen here with similar complaints recently and prescribed prednisone  but states this medication is not helping.       Prior to Admission medications   Medication Sig Start Date End Date Taking? Authorizing Provider  ALPRAZolam  (XANAX ) 0.5 MG tablet Take 1 tablet (0.5 mg total) by mouth 3 (three) times daily as needed for anxiety. 03/09/24   Geroldine Berg, MD  lidocaine  (LIDODERM ) 5 % Place 1 patch onto the skin daily. Remove & Discard patch within 12 hours or as directed by MD 05/12/24   Haze Lonni PARAS, MD  meloxicam  (MOBIC ) 15 MG tablet Take 15 mg by mouth daily. 12/26/23   [provider]  methocarbamol  (ROBAXIN ) 500 MG tablet Take 1 tablet (500 mg total) by mouth every 8 (eight) hours as needed for muscle spasms. 05/12/24   Haze Lonni PARAS, MD  PARoxetine  (PAXIL ) 10 MG tablet Take 1 tablet (10 mg total) by mouth daily. 03/19/23 03/18/24  Signa Delon LABOR, NP  predniSONE  (DELTASONE ) 20 MG tablet 3 tabs po daily x 3 days, then 2 tabs x 3 days, then 1.5 tabs x 3 days, then 1 tab x 3 days, then 0.5 tabs x 3 days 05/12/24   Haze Lonni PARAS, MD    Allergies: Atomoxetine , Atarax  [hydroxyzine ], and Trazodone  and nefazodone    Review of Systems  All other systems reviewed and are negative.   Updated Vital Signs BP 130/85 (BP Location: Right Arm)   Pulse 78   Temp 97.6 F (36.4 C)   Resp 16   Ht 5' 2 (1.575 m)   Wt 61.7 kg   LMP 05/13/2024  (Exact Date)   SpO2 99%   BMI 24.87 kg/m   Physical Exam Vitals and nursing note reviewed.  Constitutional:      Appearance: Normal appearance.  HENT:     Head: Normocephalic.  Pulmonary:     Effort: Pulmonary effort is normal.  Musculoskeletal:     Comments: There is tenderness to palpation in the soft tissues of the lumbar region.  Skin:    General: Skin is warm and dry.  Neurological:     Mental Status: She is alert and oriented to person, place, and time.     Comments: Strength is 5 out of 5 in both lower extremities.  DTRs are 3+ and symmetrical in the patellar and Achilles tendons bilaterally.  She ambulates without difficulty.     (all labs ordered are listed, but only abnormal results are displayed) Labs Reviewed - No data to display  EKG: None  Radiology: No results found.   Procedures   Medications Ordered in the ED - No data to display                                  Medical Decision Making  Patient presenting with ongoing low back pain.  Her physical examination is reassuring and there are no red flags in her history that would suggest an emergent situation.  She will be discharged with tramadol , rest, and is to follow-up with her primary doctor if not improving.     Final diagnoses:  None    ED Discharge Orders     None          Geroldine Berg, MD 05/15/24 2323

## 2024-05-15 NOTE — ED Triage Notes (Signed)
 Pt reports low back pain radiating sown both legs, seen here at this facility and given medicine but it is not helping.

## 2024-05-15 NOTE — Discharge Instructions (Signed)
 Begin taking tramadol  as prescribed as needed for pain.  Follow-up with your primary doctor if not improving in the next week.

## 2024-06-03 ENCOUNTER — Encounter (HOSPITAL_COMMUNITY): Payer: Self-pay | Admitting: Emergency Medicine

## 2024-06-03 ENCOUNTER — Emergency Department (HOSPITAL_COMMUNITY)

## 2024-06-03 ENCOUNTER — Other Ambulatory Visit: Payer: Self-pay

## 2024-06-03 ENCOUNTER — Emergency Department (HOSPITAL_COMMUNITY)
Admission: EM | Admit: 2024-06-03 | Discharge: 2024-06-04 | Disposition: A | Discharge status: Home / Self Care | Attending: Emergency Medicine | Admitting: Emergency Medicine

## 2024-06-03 DIAGNOSIS — M79661 Pain in right lower leg: Secondary | ICD-10-CM | POA: Insufficient documentation

## 2024-06-03 DIAGNOSIS — M79604 Pain in right leg: Secondary | ICD-10-CM

## 2024-06-03 DIAGNOSIS — M79651 Pain in right thigh: Secondary | ICD-10-CM | POA: Diagnosis not present

## 2024-06-03 DIAGNOSIS — W091XXA Fall from playground swing, initial encounter: Secondary | ICD-10-CM | POA: Diagnosis not present

## 2024-06-03 MED ORDER — DICLOFENAC SODIUM 1 % EX GEL: 4.0000 g | Freq: Four times a day (QID) | CUTANEOUS | 0 refills | Status: DC

## 2024-06-03 MED ORDER — METHOCARBAMOL 500 MG PO TABS
500.0000 mg | ORAL_TABLET | Freq: Once | ORAL | Status: DC
  Filled 2024-06-03: qty 1

## 2024-06-03 NOTE — Discharge Instructions (Signed)
 You are seen today for right leg injury.  X-rays are reassuring.  You can use over-the-counter medicine as instructed on packaging and use the topical lidocaine  patches, rest the leg and follow-up close with your primary care doctor.  Come back to the ER for new or worsening symptoms.

## 2024-06-03 NOTE — ED Notes (Signed)
 Pt lying in bed talking on phone

## 2024-06-03 NOTE — ED Provider Notes (Signed)
 [place] EMERGENCY DEPARTMENT AT [facility] Provider Note   CSN: [id number] Arrival date & time: 06/03/24  2118     Patient presents with: Leg Pain   Jessica [name] is a 33 y.o. female.  Presents ER today complaining of right leg pain from her thigh to her ankle after a fall from a swing today and states her leg got caught under her and twisted.  Pain is primarily in the hamstring area and calf.  She is able to bear weight and ambulate.  Denies any head injury or neck pain or other complaints. She took Tylenol  and ibuprofen  at home without relief.   Leg Pain      Prior to Admission medications   Medication Sig Start Date End Date Taking? Authorizing Provider  ALPRAZolam  (XANAX ) 0.5 MG tablet Take 1 tablet (0.5 mg total) by mouth 3 (three) times daily as needed for anxiety. 03/09/24   Geroldine Berg, MD  lidocaine  (LIDODERM ) 5 % Place 1 patch onto the skin daily. Remove & Discard patch within 12 hours or as directed by MD 05/12/24   Haze Lonni PARAS, MD  meloxicam  (MOBIC ) 15 MG tablet Take 15 mg by mouth daily. 12/26/23   [provider]  methocarbamol  (ROBAXIN ) 500 MG tablet Take 1 tablet (500 mg total) by mouth every 8 (eight) hours as needed for muscle spasms. 05/12/24   Haze Lonni PARAS, MD  PARoxetine  (PAXIL ) 10 MG tablet Take 1 tablet (10 mg total) by mouth daily. 03/19/23 03/18/24  Signa Delon LABOR, NP  predniSONE  (DELTASONE ) 20 MG tablet 3 tabs po daily x 3 days, then 2 tabs x 3 days, then 1.5 tabs x 3 days, then 1 tab x 3 days, then 0.5 tabs x 3 days 05/12/24   Haze Lonni PARAS, MD  traMADol  (ULTRAM ) 50 MG tablet Take 1 tablet (50 mg total) by mouth every 6 (six) hours as needed. 05/15/24   Geroldine Berg, MD    Allergies: Atomoxetine , Atarax  [hydroxyzine ], and Trazodone  and nefazodone    Review of Systems  Updated Vital Signs BP (!) 111/51   Pulse 63   Temp 98.7 F (37.1 C) (Oral)   Resp 17   Ht 5' 2 (1.575 m)   Wt 61.7 kg   LMP  05/13/2024 (Exact Date)   SpO2 100%   BMI 24.87 kg/m   Physical Exam Vitals and nursing note reviewed.  Constitutional:      General: She is not in acute distress.    Appearance: She is well-developed.  HENT:     Head: Normocephalic and atraumatic.  Eyes:     Conjunctiva/sclera: Conjunctivae normal.  Cardiovascular:     Rate and Rhythm: Normal rate and regular rhythm.     Heart sounds: No murmur heard. Pulmonary:     Effort: Pulmonary effort is normal. No respiratory distress.     Breath sounds: Normal breath sounds.  Abdominal:     Palpations: Abdomen is soft.     Tenderness: There is no abdominal tenderness.  Musculoskeletal:        General: No swelling.     Cervical back: Neck supple.     Comments: Right leg is not swollen, there is no bruising, tenderness to right posterior thigh and right calf.  No significant bony tenderness, normal range of motion of right hip knee and ankle, DP and PT pulses in right foot are intact.  No joint swelling.  Patient is to ambulate  Skin:    General: Skin is warm  and dry.     Capillary Refill: Capillary refill takes less than 2 seconds.  Neurological:     General: No focal deficit present.     Mental Status: She is alert and oriented to person, place, and time.  Psychiatric:        Mood and Affect: Mood normal.     (all labs ordered are listed, but only abnormal results are displayed) Labs Reviewed - No data to display  EKG: None  Radiology: No results found.   Procedures   Medications Ordered in the ED - No data to display                                  Medical Decision Making Differential diagnosis includes but limited to fracture, sprain, strain, contusion, dislocation, or other  ED course: Patient presents ER with right leg pain after fall from a swing and is having pain to the hamstring and calf area.  There is no significant bony tenderness, x-rays of right femur and tib-fib were ordered out of abundance of caution  and showed no acute fracture, no dislocation.  I suspect muscle strain, discussed with patient we will treat supportively.    Amount and/or Complexity of Data Reviewed Radiology: ordered.  Risk Prescription drug management.        Final diagnoses:  None    ED Discharge Orders     None          Suellen Sherran LABOR, PA-C 06/03/24 2346    Franklyn Sid SAILOR, MD 06/04/24 1500

## 2024-06-03 NOTE — ED Triage Notes (Signed)
 Pt c/o right leg pain after she was on a swing and tried to stop herself causing her foot to get caught. Pt c/o pain from right hip to right ankle. Pt ambulatory with a limp to triage.

## 2024-06-13 ENCOUNTER — Encounter: Payer: Self-pay | Admitting: Radiology

## 2024-06-22 ENCOUNTER — Emergency Department (HOSPITAL_COMMUNITY)
Admission: EM | Admit: 2024-06-22 | Discharge: 2024-06-22 | Disposition: A | Discharge status: Home / Self Care | Attending: Emergency Medicine | Admitting: Emergency Medicine

## 2024-06-22 ENCOUNTER — Other Ambulatory Visit: Payer: Self-pay

## 2024-06-22 DIAGNOSIS — F419 Anxiety disorder, unspecified: Secondary | ICD-10-CM | POA: Diagnosis present

## 2024-06-22 DIAGNOSIS — M5442 Lumbago with sciatica, left side: Secondary | ICD-10-CM | POA: Insufficient documentation

## 2024-06-22 DIAGNOSIS — G8929 Other chronic pain: Secondary | ICD-10-CM | POA: Insufficient documentation

## 2024-06-22 DIAGNOSIS — M5441 Lumbago with sciatica, right side: Secondary | ICD-10-CM | POA: Diagnosis not present

## 2024-06-22 MED ORDER — KETOROLAC TROMETHAMINE 15 MG/ML IJ SOLN
15.0000 mg | Freq: Once | INTRAMUSCULAR | Status: AC
  Administered 2024-06-22: 15 mg via INTRAMUSCULAR
  Filled 2024-06-22: qty 1

## 2024-06-22 MED ORDER — METHOCARBAMOL 750 MG PO TABS: 750.0000 mg | ORAL_TABLET | Freq: Three times a day (TID) | ORAL | 0 refills | Status: AC

## 2024-06-22 MED ORDER — METHYLPREDNISOLONE 4 MG PO TBPK: ORAL_TABLET | ORAL | 0 refills | Status: DC

## 2024-06-22 MED ORDER — ALPRAZOLAM 0.5 MG PO TABS
0.5000 mg | ORAL_TABLET | Freq: Once | ORAL | Status: AC
  Administered 2024-06-22: 0.5 mg via ORAL
  Filled 2024-06-22: qty 1

## 2024-06-22 MED ORDER — LIDOCAINE 5 % EX PTCH
1.0000 | MEDICATED_PATCH | CUTANEOUS | Status: DC
  Administered 2024-06-22: 1 via TRANSDERMAL
  Filled 2024-06-22: qty 1

## 2024-06-22 NOTE — ED Triage Notes (Signed)
 Pt arrived via POV to hospital. Pt states that she has been overly stressed lately with her son being sick and her husband that was recently in the hospital being sick. Pt states she recently loss her great grandparents and that it's just been very hard. Pt stated she did not take all of her xanax  today and think that has something to do with what's going on. Denies thoughts of self harm.

## 2024-06-22 NOTE — ED Provider Notes (Signed)
 [place] EMERGENCY DEPARTMENT AT [facility] Provider Note   CSN: [id number] Arrival date & time: 06/22/24  1739     Patient presents with: Anxiety   Jessica [name] is a 33 y.o. female.   Patient is a 33 year old female who presents to the emergency department with a chief complaint of anxiety.  She presented to the emergency department with her son who is also being seen secondary to an upper respiratory infection and asthma exacerbation.  She notes that she has had increased anxiety secondary to his state of health, and she is recently lost her great grandparents.  She denies any chest pain, shortness of breath, abdominal pain, nausea, vomiting, diarrhea.  She notes that she did not take all of her anxiety medication today and is chronically on Xanax .   Anxiety       Prior to Admission medications   Medication Sig Start Date End Date Taking? Authorizing Provider  ALPRAZolam  (XANAX ) 0.5 MG tablet Take 1 tablet (0.5 mg total) by mouth 3 (three) times daily as needed for anxiety. 03/09/24   Geroldine Berg, MD  diclofenac  Sodium (VOLTAREN ) 1 % GEL Apply 4 g topically 4 (four) times daily. 06/03/24   Beatty, Celeste A, PA-C  lidocaine  (LIDODERM ) 5 % Place 1 patch onto the skin daily. Remove & Discard patch within 12 hours or as directed by MD 05/12/24   Haze Lonni PARAS, MD  meloxicam  (MOBIC ) 15 MG tablet Take 15 mg by mouth daily. 12/26/23   [provider]  methocarbamol  (ROBAXIN ) 500 MG tablet Take 1 tablet (500 mg total) by mouth every 8 (eight) hours as needed for muscle spasms. 05/12/24   Haze Lonni PARAS, MD  PARoxetine  (PAXIL ) 10 MG tablet Take 1 tablet (10 mg total) by mouth daily. 03/19/23 03/18/24  Signa Delon LABOR, NP  predniSONE  (DELTASONE ) 20 MG tablet 3 tabs po daily x 3 days, then 2 tabs x 3 days, then 1.5 tabs x 3 days, then 1 tab x 3 days, then 0.5 tabs x 3 days 05/12/24   Haze Lonni PARAS, MD  traMADol  (ULTRAM ) 50 MG tablet Take 1  tablet (50 mg total) by mouth every 6 (six) hours as needed. 05/15/24   Geroldine Berg, MD    Allergies: Atomoxetine , Atarax  [hydroxyzine ], and Trazodone  and nefazodone    Review of Systems  Psychiatric/Behavioral:         Anxiety  All other systems reviewed and are negative.   Updated Vital Signs Ht 5' 2 (1.575 m)   Wt 61.7 kg   BMI 24.88 kg/m   Physical Exam Vitals and nursing note reviewed.  Constitutional:      General: She is not in acute distress.    Appearance: Normal appearance. She is not ill-appearing.  HENT:     Head: Normocephalic and atraumatic.     Nose: Nose normal.     Mouth/Throat:     Mouth: Mucous membranes are moist.  Eyes:     Extraocular Movements: Extraocular movements intact.     Conjunctiva/sclera: Conjunctivae normal.     Pupils: Pupils are equal, round, and reactive to light.  Cardiovascular:     Rate and Rhythm: Normal rate and regular rhythm.     Pulses: Normal pulses.     Heart sounds: Normal heart sounds. No murmur heard.    No gallop.  Pulmonary:     Effort: Pulmonary effort is normal. No respiratory distress.     Breath sounds: Normal breath sounds. No stridor. No wheezing,  rhonchi or rales.  Musculoskeletal:        General: Normal range of motion.     Cervical back: Normal range of motion and neck supple.  Skin:    General: Skin is warm and dry.  Neurological:     General: No focal deficit present.     Mental Status: She is alert and oriented to person, place, and time. Mental status is at baseline.  Psychiatric:        Behavior: Behavior normal.        Thought Content: Thought content normal.        Judgment: Judgment normal.     Comments: Tearful, anxious appearing     (all labs ordered are listed, but only abnormal results are displayed) Labs Reviewed - No data to display  EKG: None  Radiology: No results found.   Procedures   Medications Ordered in the ED  ALPRAZolam  (XANAX ) tablet 0.5 mg (has no administration  in time range)                                    Medical Decision Making Patient is doing well at this time and is stable for discharge home.  Anxiety is greatly improved with treatment in the emergency department.  Patient also notes that she is having an exacerbation of her chronic back pain.  Exam is unremarkable and she is ambulatory without difficulty.  She has no indication for cauda equina syndrome, vertebral osteomyelitis, epidural abscess.  Will continue treatment of her chronic back pain on an outpatient basis.  Do not suspect that any further workup is warranted on an emergent basis.  Strict turn precautions were provided for any new or worsening symptoms.  Patient voiced understanding and had no additional questions.  Risk Prescription drug management.        Final diagnoses:  None    ED Discharge Orders     None          Benelli Winther D, PA-C 06/22/24 1826    Bernard Drivers, MD 06/23/24 1525

## 2024-06-22 NOTE — Discharge Instructions (Signed)
 Please follow-up closely with your primary care doctor on an outpatient basis.  Return to emergency department immediately for any new or worsening symptoms.

## 2024-07-28 ENCOUNTER — Other Ambulatory Visit: Payer: Self-pay

## 2024-07-28 ENCOUNTER — Emergency Department (HOSPITAL_COMMUNITY)
Admission: EM | Admit: 2024-07-28 | Discharge: 2024-07-28 | Disposition: A | Discharge status: Home / Self Care | Attending: Emergency Medicine | Admitting: Emergency Medicine

## 2024-07-28 DIAGNOSIS — R197 Diarrhea, unspecified: Secondary | ICD-10-CM | POA: Diagnosis not present

## 2024-07-28 DIAGNOSIS — R112 Nausea with vomiting, unspecified: Secondary | ICD-10-CM | POA: Insufficient documentation

## 2024-07-28 DIAGNOSIS — K0889 Other specified disorders of teeth and supporting structures: Secondary | ICD-10-CM | POA: Diagnosis present

## 2024-07-28 DIAGNOSIS — K047 Periapical abscess without sinus: Secondary | ICD-10-CM | POA: Diagnosis not present

## 2024-07-28 LAB — CBC WITH DIFFERENTIAL/PLATELET
Abs Immature Granulocytes: 0.03 K/uL (ref 0.00–0.07)
Basophils Absolute: 0 K/uL (ref 0.0–0.1)
Basophils Relative: 1 %
Eosinophils Absolute: 0.1 K/uL (ref 0.0–0.5)
Eosinophils Relative: 1 %
HCT: 36 % (ref 36.0–46.0)
Hemoglobin: 11.8 g/dL — ABNORMAL LOW (ref 12.0–15.0)
Immature Granulocytes: 0 %
Lymphocytes Relative: 25 %
Lymphs Abs: 2.1 K/uL (ref 0.7–4.0)
MCH: 28.7 pg (ref 26.0–34.0)
MCHC: 32.8 g/dL (ref 30.0–36.0)
MCV: 87.6 fL (ref 80.0–100.0)
Monocytes Absolute: 0.4 K/uL (ref 0.1–1.0)
Monocytes Relative: 5 %
Neutro Abs: 5.8 K/uL (ref 1.7–7.7)
Neutrophils Relative %: 68 %
Platelets: 270 K/uL (ref 150–400)
RBC: 4.11 MIL/uL (ref 3.87–5.11)
RDW: 14.9 % (ref 11.5–15.5)
WBC: 8.5 K/uL (ref 4.0–10.5)
nRBC: 0 % (ref 0.0–0.2)

## 2024-07-28 LAB — COMPREHENSIVE METABOLIC PANEL WITH GFR
ALT: 11 U/L (ref 0–44)
AST: 18 U/L (ref 15–41)
Albumin: 3.6 g/dL (ref 3.5–5.0)
Alkaline Phosphatase: 39 U/L (ref 38–126)
Anion gap: 11 (ref 5–15)
BUN: 7 mg/dL (ref 6–20)
CO2: 17 mmol/L — ABNORMAL LOW (ref 22–32)
Calcium: 7.9 mg/dL — ABNORMAL LOW (ref 8.9–10.3)
Chloride: 110 mmol/L (ref 98–111)
Creatinine, Ser: 0.72 mg/dL (ref 0.44–1.00)
GFR, Estimated: >60 mL/min (ref >60)
Glucose, Bld: 96 mg/dL (ref 70–99)
Potassium: 4.9 mmol/L (ref 3.5–5.1)
Sodium: 138 mmol/L (ref 135–145)
Total Bilirubin: <0.2 mg/dL (ref 0.0–1.2)
Total Protein: 5.4 g/dL — ABNORMAL LOW (ref 6.5–8.1)

## 2024-07-28 LAB — LIPASE, BLOOD: Lipase: 36 U/L (ref 11–51)

## 2024-07-28 LAB — HCG, QUANTITATIVE, PREGNANCY: hCG, Beta Chain, Quant, S: <1 m[IU]/mL (ref <5)

## 2024-07-28 MED ORDER — KETOROLAC TROMETHAMINE 15 MG/ML IJ SOLN
15.0000 mg | Freq: Once | INTRAMUSCULAR | Status: AC
  Administered 2024-07-28: 13:00:00 15 mg via INTRAVENOUS
  Filled 2024-07-28: qty 1

## 2024-07-28 MED ORDER — ONDANSETRON 4 MG PO TBDP: 4.0000 mg | ORAL_TABLET | Freq: Three times a day (TID) | ORAL | 0 refills | Status: AC | PRN

## 2024-07-28 MED ORDER — AMOXICILLIN 500 MG PO CAPS: 500.0000 mg | ORAL_CAPSULE | Freq: Three times a day (TID) | ORAL | 0 refills | Status: DC

## 2024-07-28 MED ORDER — OXYCODONE-ACETAMINOPHEN 5-325 MG PO TABS
1.0000 | ORAL_TABLET | Freq: Once | ORAL | Status: AC
  Administered 2024-07-28: 13:00:00 1 via ORAL
  Filled 2024-07-28: qty 1

## 2024-07-28 MED ORDER — ONDANSETRON HCL 4 MG/2ML IJ SOLN
4.0000 mg | Freq: Once | INTRAMUSCULAR | Status: AC
  Administered 2024-07-28: 13:00:00 4 mg via INTRAVENOUS
  Filled 2024-07-28: qty 2

## 2024-07-28 MED ORDER — SODIUM CHLORIDE 0.9 % IV BOLUS
1000.0000 mL | Freq: Once | INTRAVENOUS | Status: AC
  Administered 2024-07-28: 13:00:00 1000 mL via INTRAVENOUS

## 2024-07-28 MED ORDER — HYDROCODONE-ACETAMINOPHEN 5-325 MG PO TABS: 1.0000 | ORAL_TABLET | Freq: Four times a day (QID) | ORAL | 0 refills | Status: DC | PRN

## 2024-07-28 NOTE — ED Provider Notes (Signed)
 [place] EMERGENCY DEPARTMENT AT [facility] Provider Note   CSN: [id number] Arrival date & time: 07/28/24  1130     Patient presents with: Dental Pain   Jessica [name] is a 33 y.o. female.   Patient is a 33 year old female who presents to the emergency department with a chief complaint of right upper dental pain, nausea, vomiting, diarrhea which has been ongoing for approximate the past 4 days.  She denies any associated fever or chills.  She has had no associated chest pain, shortness of breath.  She denies any stridor, dysphagia, drooling, dyspnea.  She has been taking over-the-counter medications with no improvement in her symptoms.  She denies any associated dysuria or hematuria.   Dental Pain      Prior to Admission medications  Medication Sig Start Date End Date Taking? Authorizing Provider  ALPRAZolam  (XANAX ) 0.5 MG tablet Take 1 tablet (0.5 mg total) by mouth 3 (three) times daily as needed for anxiety. 03/09/24   Geroldine Berg, MD  methocarbamol  (ROBAXIN ) 750 MG tablet Take 1 tablet (750 mg total) by mouth 3 (three) times daily. 06/22/24   Daralene Bruckner D, PA-C  methylPREDNISolone  (MEDROL  DOSEPAK) 4 MG TBPK tablet Taper over 6 days, dispense 21 tabs 06/22/24   Daralene Bruckner D, PA-C  PARoxetine  (PAXIL ) 10 MG tablet Take 1 tablet (10 mg total) by mouth daily. 03/19/23 03/18/24  Signa Delon LABOR, NP    Allergies: Atomoxetine , Atarax  [hydroxyzine ], and Trazodone  and nefazodone    Review of Systems  HENT:  Positive for dental problem.   Gastrointestinal:  Positive for nausea and vomiting.  All other systems reviewed and are negative.   Updated Vital Signs BP 119/67 (BP Location: Right Arm)   Pulse (!) 58   Temp 99 F (37.2 C) (Oral)   Resp 18   Ht 5' 2 (1.575 m)   Wt 61.7 kg   SpO2 98%   BMI 24.88 kg/m   Physical Exam Vitals and nursing note reviewed.  Constitutional:      General: She is not in acute distress.    Appearance: Normal  appearance. She is not ill-appearing.  HENT:     Head: Normocephalic and atraumatic.     Nose: Nose normal.     Mouth/Throat:     Mouth: Mucous membranes are moist.     Pharynx: No oropharyngeal exudate or posterior oropharyngeal erythema.     Comments: Mild swelling noted to the right upper gumline, floor mouth is soft, tolerant secretions without difficulty, no peritonsillar swelling Eyes:     Extraocular Movements: Extraocular movements intact.     Conjunctiva/sclera: Conjunctivae normal.     Pupils: Pupils are equal, round, and reactive to light.  Cardiovascular:     Rate and Rhythm: Normal rate and regular rhythm.     Pulses: Normal pulses.     Heart sounds: Normal heart sounds. No murmur heard.    No gallop.  Pulmonary:     Effort: Pulmonary effort is normal. No respiratory distress.     Breath sounds: Normal breath sounds. No stridor. No wheezing, rhonchi or rales.  Abdominal:     General: Abdomen is flat. Bowel sounds are normal. There is no distension.     Palpations: Abdomen is soft.     Tenderness: There is no abdominal tenderness. There is no guarding.  Musculoskeletal:        General: Normal range of motion.     Cervical back: Normal range of motion and neck supple. No  rigidity or tenderness.  Skin:    General: Skin is warm and dry.     Findings: No bruising or rash.  Neurological:     General: No focal deficit present.     Mental Status: She is alert and oriented to person, place, and time. Mental status is at baseline.     Cranial Nerves: No cranial nerve deficit.     Sensory: No sensory deficit.     Motor: No weakness.     Coordination: Coordination normal.     Gait: Gait normal.  Psychiatric:        Mood and Affect: Mood normal.        Behavior: Behavior normal.        Thought Content: Thought content normal.        Judgment: Judgment normal.     (all labs ordered are listed, but only abnormal results are displayed) Labs Reviewed  COMPREHENSIVE  METABOLIC PANEL WITH GFR  LIPASE, BLOOD  CBC WITH DIFFERENTIAL/PLATELET  HCG, SERUM, QUALITATIVE    EKG: None  Radiology: No results found.   Procedures   Medications Ordered in the ED  sodium chloride  0.9 % bolus 1,000 mL (has no administration in time range)  ondansetron  (ZOFRAN ) injection 4 mg (has no administration in time range)  oxyCODONE -acetaminophen  (PERCOCET/ROXICET) 5-325 MG per tablet 1 tablet (has no administration in time range)  ketorolac  (TORADOL ) 15 MG/ML injection 15 mg (has no administration in time range)                                    Medical Decision Making Patient is doing well at this time and is stable for discharge home.  Discussed with patient that all blood work in the emergency department has been unremarkable.  Will treat her for dental infection at this point.  She has no signs of an obvious drainable abscess.  She has no indication for acute peritonsillar abscess, Ludwig's angina, retropharyngeal abscess, epiglottitis.  She has no signs of acute airway distress.  Vital signs are stable with no indication for sepsis.  Dental resources were provided.  Close follow-up with a dentist was discussed as well as strict precautions for any new or worsening symptoms.  Patient voiced understanding and had no additional questions.  She is tolerating p.o. intake at this time without difficulty.  Amount and/or Complexity of Data Reviewed Labs: ordered.  Risk Prescription drug management.        Final diagnoses:  None    ED Discharge Orders     None          Daralene Lonni BIRCH, PA-C 07/28/24 1331

## 2024-07-28 NOTE — ED Triage Notes (Signed)
 Pt arrived to ED via POV with complaints of dental pain, nausea, and vomiting that started 3-4 days ago. Reports fever at home, with chills. Ambulatory in triage, oriented, continent.

## 2024-07-28 NOTE — ED Notes (Signed)
 Pt/family received d/c paperwork at this time. After going over the paperwork any questions, comments, or concerns were answered to the best of this nurse's knowledge. The pt/family verbally acknowledged the teachings/instructions.

## 2024-07-28 NOTE — Discharge Instructions (Signed)
 Please follow-up closely with a dentist on an outpatient basis.  Return to emergency department immediately for any new or worsening symptoms.

## 2024-08-02 ENCOUNTER — Encounter (HOSPITAL_COMMUNITY): Payer: Self-pay

## 2024-08-02 ENCOUNTER — Emergency Department (HOSPITAL_COMMUNITY)
Admission: EM | Admit: 2024-08-02 | Discharge: 2024-08-02 | Disposition: A | Discharge status: Home / Self Care | Attending: Emergency Medicine | Admitting: Emergency Medicine

## 2024-08-02 ENCOUNTER — Other Ambulatory Visit: Payer: Self-pay

## 2024-08-02 DIAGNOSIS — K0889 Other specified disorders of teeth and supporting structures: Secondary | ICD-10-CM | POA: Diagnosis present

## 2024-08-02 DIAGNOSIS — K029 Dental caries, unspecified: Secondary | ICD-10-CM | POA: Diagnosis not present

## 2024-08-02 MED ORDER — ONDANSETRON 4 MG PO TBDP
4.0000 mg | ORAL_TABLET | Freq: Once | ORAL | Status: AC
  Administered 2024-08-02: 4 mg via ORAL
  Filled 2024-08-02: qty 1

## 2024-08-02 MED ORDER — AMOXICILLIN-POT CLAVULANATE 875-125 MG PO TABS: 1.0000 | ORAL_TABLET | Freq: Two times a day (BID) | ORAL | 0 refills | Status: AC

## 2024-08-02 MED ORDER — OXYCODONE-ACETAMINOPHEN 5-325 MG PO TABS
1.0000 | ORAL_TABLET | Freq: Once | ORAL | Status: AC
  Administered 2024-08-02: 1 via ORAL
  Filled 2024-08-02: qty 1

## 2024-08-02 MED ORDER — ONDANSETRON HCL 4 MG PO TABS: 4.0000 mg | ORAL_TABLET | Freq: Three times a day (TID) | ORAL | 0 refills | Status: AC | PRN

## 2024-08-02 MED ORDER — KETOROLAC TROMETHAMINE 15 MG/ML IJ SOLN
15.0000 mg | Freq: Once | INTRAMUSCULAR | Status: AC
  Administered 2024-08-02: 15 mg via INTRAMUSCULAR
  Filled 2024-08-02: qty 1

## 2024-08-02 MED ORDER — OXYCODONE-ACETAMINOPHEN 5-325 MG PO TABS: 1.0000 | ORAL_TABLET | Freq: Four times a day (QID) | ORAL | 0 refills | Status: DC | PRN

## 2024-08-02 NOTE — ED Triage Notes (Signed)
 Pt arrived via POV c/o recurrent dental pain since her recent visit and reports she feels nauseated now and the pain has not improved even after taking prescribed medications.

## 2024-08-02 NOTE — Discharge Instructions (Addendum)
 It was a pleasure caring for you today in the emergency department.  Please gargle with Listerine 3 times daily.  Please gargle with warm salt water 3 times daily.  You can use topical Orajel available over-the-counter to help with the pain.  Be sure to stop smoking as this can be quite harmful to your teeth.  Please call dentist to arrange follow-up  Please return to the emergency department for any worsening or worrisome symptoms.  [facility] -  [address] [place] - [place] [address] [phone]  [facility]) treats patients who are in the following groups:  Pediatrics with medicaid up to age 34 Patients over 70 with an orange card and a referral from a medical provider.  - If they are referred - there is a 40$ copay for any service - extration etc.  To get an orange card the following process is necessary.  Call the [facility] to apply for card - [phone] Once approved, you the patient will be set up with a medical provider Medical provider will refer patient to the [facility] clinic .  Offices accepting Medicaid patients:  Urgent [facility] - [address] [place], [place] [address] [phone]  [facility] - [address] [place] [address] [phone]  [facility] (will see emergency dental patients from ED) - [address] [place], [place] [address] [phone]   RESOURCE GUIDE  Chronic Pain Problems: Contact [facility]  [phone] Patients need to be referred by their primary care doctor.  Insufficient Money for Medicine: Contact [employer]:  call [phone]  No Primary Care Doctor: Call [facility]  [phone] - can help you locate a primary care doctor that  accepts your insurance, provides certain services, etc. Physician Referral Service- [phone]  Agencies that provide inexpensive medical care: [facility]  [phone] [facility]  Internal Medicine  [phone] [facility]  [phone] [facility]  [phone] [facility]  [phone] [facility]  [phone]  Medicaid-accepting [facility] Providers: [facility]- [address]  [phone], Mon-Fri 9am-7pm, Sat 9am-1pm [facility]- [address] [place], [address] [place]  [phone] [facility]- [address], [address] [place]  [phone] [facility]- [address]  [phone] [facility]- [address] [place], [address], [phone]  Only accepts [place] patients after they have their name  applied to their card  Self Pay (no insurance) in [facility]: Sickle Cell Patients - [facility]  [address] [place], [phone] [facility]- [address] [place]  [phone]       [place]- [address]       -     [facility]- see information above (Speak to [employer] if you do not have insurance)       -  [facility]- [address],  [phone]       -  [facility]- [address] [phone]       -  [facility]-  [address], [place], [phone]       -  [facility] - [address], [phone]       -  [facility]- [address], [phone], also  [address] [phone]       -     [facility]- [address] [place], [phone], 1st & 3rd Saturday         every month, 10am-1pm  -     [facility] [employer] [facility]   [address] [place], [place].   Phone:  [phone], Fax:  [phone]. Hours of Operation:  9 am - 6 pm, M-F.  -     [facility]   [address], [place]   Phone: [phone], Fax: [phone]. Hours of Operation:  8:30 am - 5:30 pm, M-F.    Dental Assistance If unable to pay or uninsured, contact:  [facility]. to become qualified for the adult dental clinic.  Patients with Medicaid: [facility] [phone] [address], [phone] [address] [phone]  If unable to pay, or uninsured, contact [facility] [phone] in [place], [phone] in [facility]) to become qualified for the adult dental clinic  [facility] [address] [place], [place] [address] [phone] [email or web address]  Other Proofreader Services: [facility]- [address] [place], [place], [place], [address], [phone], Ext. [phone], 2nd and 4th Thursday of the month at 6:30am.  10 clients each day by appointment, can sometimes see walk-in patients if someone does not show for an appointment. [facility]- [address] [place], [place], [address], [phone] [facility] [address], [place], [place], [address], [phone] [facility] Health Department- [phone] [facility]- [phone] [facility] [phone]
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
## 2024-08-11 ENCOUNTER — Emergency Department (HOSPITAL_COMMUNITY)
Admission: EM | Admit: 2024-08-11 | Discharge: 2024-08-11 | Disposition: A | Discharge status: Home / Self Care | Attending: Emergency Medicine | Admitting: Emergency Medicine

## 2024-08-11 ENCOUNTER — Encounter (HOSPITAL_COMMUNITY): Payer: Self-pay

## 2024-08-11 ENCOUNTER — Other Ambulatory Visit: Payer: Self-pay

## 2024-08-11 ENCOUNTER — Emergency Department (HOSPITAL_COMMUNITY)

## 2024-08-11 DIAGNOSIS — M5412 Radiculopathy, cervical region: Secondary | ICD-10-CM | POA: Diagnosis not present

## 2024-08-11 DIAGNOSIS — M5410 Radiculopathy, site unspecified: Secondary | ICD-10-CM

## 2024-08-11 DIAGNOSIS — M542 Cervicalgia: Secondary | ICD-10-CM | POA: Diagnosis present

## 2024-08-11 LAB — COMPREHENSIVE METABOLIC PANEL WITH GFR
ALT: 42 U/L (ref 0–44)
AST: 26 U/L (ref 15–41)
Albumin: 3.9 g/dL (ref 3.5–5.0)
Alkaline Phosphatase: 44 U/L (ref 38–126)
Anion gap: 13 (ref 5–15)
BUN: 7 mg/dL (ref 6–20)
CO2: 21 mmol/L — ABNORMAL LOW (ref 22–32)
Calcium: 8.8 mg/dL — ABNORMAL LOW (ref 8.9–10.3)
Chloride: 108 mmol/L (ref 98–111)
Creatinine, Ser: 0.78 mg/dL (ref 0.44–1.00)
GFR, Estimated: >60 mL/min
Glucose, Bld: 78 mg/dL (ref 70–99)
Potassium: 4.2 mmol/L (ref 3.5–5.1)
Sodium: 141 mmol/L (ref 135–145)
Total Bilirubin: <0.2 mg/dL (ref 0.0–1.2)
Total Protein: 5.9 g/dL — ABNORMAL LOW (ref 6.5–8.1)

## 2024-08-11 LAB — CBC WITH DIFFERENTIAL/PLATELET
Abs Immature Granulocytes: 0.02 K/uL (ref 0.00–0.07)
Basophils Absolute: 0 K/uL (ref 0.0–0.1)
Basophils Relative: 1 %
Eosinophils Absolute: 0.1 K/uL (ref 0.0–0.5)
Eosinophils Relative: 1 %
HCT: 38.1 % (ref 36.0–46.0)
Hemoglobin: 12.8 g/dL (ref 12.0–15.0)
Immature Granulocytes: 0 %
Lymphocytes Relative: 24 %
Lymphs Abs: 1.8 K/uL (ref 0.7–4.0)
MCH: 29.7 pg (ref 26.0–34.0)
MCHC: 33.6 g/dL (ref 30.0–36.0)
MCV: 88.4 fL (ref 80.0–100.0)
Monocytes Absolute: 0.4 K/uL (ref 0.1–1.0)
Monocytes Relative: 5 %
Neutro Abs: 4.9 K/uL (ref 1.7–7.7)
Neutrophils Relative %: 69 %
Platelets: 320 K/uL (ref 150–400)
RBC: 4.31 MIL/uL (ref 3.87–5.11)
RDW: 14.3 % (ref 11.5–15.5)
WBC: 7.2 K/uL (ref 4.0–10.5)
nRBC: 0 % (ref 0.0–0.2)

## 2024-08-11 MED ORDER — OXYCODONE-ACETAMINOPHEN 5-325 MG PO TABS: 1.0000 | ORAL_TABLET | Freq: Four times a day (QID) | ORAL | 0 refills | Status: AC | PRN

## 2024-08-11 MED ORDER — PREDNISONE 10 MG PO TABS: 20.0000 mg | ORAL_TABLET | Freq: Every day | ORAL | 0 refills | Status: AC

## 2024-08-11 MED ORDER — HYDROMORPHONE HCL 1 MG/ML IJ SOLN
1.0000 mg | Freq: Once | INTRAMUSCULAR | Status: AC
  Administered 2024-08-11: 1 mg via INTRAVENOUS
  Filled 2024-08-11: qty 1

## 2024-08-11 MED ORDER — GADOBUTROL 1 MMOL/ML IV SOLN
6.0000 mL | Freq: Once | INTRAVENOUS | Status: AC | PRN
  Administered 2024-08-11: 6 mL via INTRAVENOUS

## 2024-08-11 MED ORDER — METHYLPREDNISOLONE SODIUM SUCC 125 MG IJ SOLR
125.0000 mg | Freq: Once | INTRAMUSCULAR | Status: AC
  Administered 2024-08-11: 125 mg via INTRAVENOUS
  Filled 2024-08-11: qty 2

## 2024-08-11 MED ORDER — ONDANSETRON HCL 4 MG/2ML IJ SOLN
4.0000 mg | Freq: Once | INTRAMUSCULAR | Status: AC
  Administered 2024-08-11: 4 mg via INTRAVENOUS
  Filled 2024-08-11: qty 2

## 2024-08-11 NOTE — Discharge Instructions (Signed)
 Follow-up with Dr. Onetha or one of his associates in the next couple weeks

## 2024-08-11 NOTE — ED Provider Notes (Signed)
 " [place] EMERGENCY DEPARTMENT AT [facility] Provider Note   CSN: [id number] Arrival date & time: 08/11/24  1649     Patient presents with:  left arm pain   Jessica [name] is a 34 y.o. female.  {Add pertinent medical, surgical, social history, OB history to YEP:67052} Patient complains of pain in her left arm and her neck and some weakness in her left arm   Extremity Weakness       Prior to Admission medications  Medication Sig Start Date End Date Taking? Authorizing Provider  oxyCODONE -acetaminophen  (PERCOCET/ROXICET) 5-325 MG tablet Take 1 tablet by mouth every 6 (six) hours as needed for severe pain (pain score 7-10). 08/11/24  Yes Sascha Baugher, MD  predniSONE  (DELTASONE ) 10 MG tablet Take 2 tablets (20 mg total) by mouth daily. 08/11/24  Yes Sigurd Pugh, MD  methocarbamol  (ROBAXIN ) 750 MG tablet Take 1 tablet (750 mg total) by mouth 3 (three) times daily. 06/22/24   Daralene Lonni BIRCH, PA-C  ondansetron  (ZOFRAN ) 4 MG tablet Take 1 tablet (4 mg total) by mouth every 8 (eight) hours as needed for nausea or vomiting. 08/02/24   Elnor Jayson LABOR, DO  ondansetron  (ZOFRAN -ODT) 4 MG disintegrating tablet Take 1 tablet (4 mg total) by mouth every 8 (eight) hours as needed for nausea or vomiting. 07/28/24   Daralene Lonni BIRCH, PA-C  PARoxetine  (PAXIL ) 10 MG tablet Take 1 tablet (10 mg total) by mouth daily. 03/19/23 03/18/24  Signa Delon LABOR, NP    Allergies: Atomoxetine , Atarax  [hydroxyzine ], and Trazodone  and nefazodone    Review of Systems  Musculoskeletal:  Positive for extremity weakness.    Updated Vital Signs BP 121/82 (BP Location: Left Arm)   Pulse 79   Temp 98.6 F (37 C) (Oral)   Resp 18   Ht 5' 2 (1.575 m)   Wt 62.1 kg   LMP 08/01/2024 (Exact Date)   SpO2 98%   BMI 25.06 kg/m   Physical Exam  (all labs ordered are listed, but only abnormal results are displayed) Labs Reviewed  COMPREHENSIVE METABOLIC PANEL WITH GFR - Abnormal; Notable  for the following components:      Result Value   CO2 21 (*)    Calcium  8.8 (*)    Total Protein 5.9 (*)    All other components within normal limits  CBC WITH DIFFERENTIAL/PLATELET    EKG: None  Radiology: MR Neck Soft Tissue Only W or Wo Contrast Result Date: 08/11/2024 EXAM: MR NECK WITH AND WITHOUT INTRAVENOUS CONTRAST 08/11/2024 06:18:48 PM TECHNIQUE: Multiplanar multisequence MRI of the neck was performed with and without the administration of intravenous contrast. COMPARISON: None available. CLINICAL HISTORY: Soft tissue infection suspected, neck, xray done Soft tissue infection suspected, neck, xray done FINDINGS: PHARYNX AND LARYNX: The palatine tonsils are normal in appearance. The tongue is normal in appearance. The valleculae, epiglottis, aryepiglottic folds and pyriform sinuses appear unremarkable. The true and false vocal cords are normal in appearance. No mass or abscess is seen. SALIVARY GLANDS: The parotid and submandibular glands appear unremarkable. THYROID: The thyroid gland appears unremarkable. LYMPH NODES: No cervical or supraclavicular lymphadenopathy is seen. SOFT TISSUES: No appreciable soft tissue swelling or mass is seen. BRAIN, ORBITS AND SINUSES: No acute abnormality BONES: No acute osseous abnormality is seen. IMPRESSION: 1. No acute findings within the soft tissues of the neck. Electronically signed by: Franky Stanford MD 08/11/2024 07:17 PM EST RP Workstation: [id number]   MR Cervical Spine Wo Contrast Result Date: 08/11/2024 EXAM: MRI  CERVICAL SPINE WITHOUT CONTRAST 08/11/2024 06:18:48 PM TECHNIQUE: Multiplanar multisequence MRI of the cervical spine was performed. COMPARISON: 10/11/2018 CLINICAL HISTORY: Ataxia, nontraumatic, cervical pathology suspected. FINDINGS: LIMITATIONS/ARTIFACTS: Examination is limited due to motion artifact. BONES AND ALIGNMENT: Reversal of the normal cervical lordosis. Normal vertebral body heights. Bone marrow signal is unremarkable. SPINAL  CORD: Normal spinal cord size. No abnormal spinal cord signal. SOFT TISSUES: No paraspinal mass. C2-C3: No significant disc herniation. No spinal canal stenosis or neural foraminal narrowing. C3-C4: No significant disc herniation. No spinal canal stenosis or neural foraminal narrowing. C4-C5: Subtle disc desiccation. Minimal disc bulge. No significant spinal canal or foraminal stenosis. C5-C6: Disc desiccation. Diffuse disc bulge slightly eccentric to the right which indents the ventral thecal sac with mild flattening of the ventral cervical cord. No cord signal abnormality. There is mild spinal canal stenosis. There is a small central annular fissure noted. Subtle uncovertebral hypertrophy. Mild facet arthrosis on the right. There is no significant foraminal stenosis. C6-C7: Disc desiccation. Diffuse disc bulge indents the ventral thecal sac with flattening of the ventral cervical cord. No cord signal abnormality. No significant spinal canal stenosis. Subtle uncovertebral hypertrophy on the left. Mild left foraminal stenosis. C7-T1: No significant disc herniation. No spinal canal stenosis or neural foraminal narrowing. IMPRESSION: 1. Early/mild degenerative changes at C4-5, C5-6, and C6-7 as above. Findings are progressed since 2020. 2. Disc desiccation and diffuse disc bulge at C5-6 with mild spinal canal stenosis and mild flattening of the ventral cervical cord. No cord signal abnormality. 3. Disc desiccation and diffuse disc bulge at C6-7 with mild flattening of the ventral cervical cord. No cord signal abnormality. 4. Mild foraminal stenosis on the left at C6-7. 5. Reversal of the normal cervical lordosis. Electronically signed by: Donnice Mania MD 08/11/2024 06:55 PM EST RP Workstation: [id number]   MR BRAIN WO CONTRAST Result Date: 08/11/2024 EXAM: MRI BRAIN WITHOUT CONTRAST 08/11/2024 06:18:48 PM TECHNIQUE: Multiplanar multisequence MRI of the head/brain was performed without the administration of  intravenous contrast. COMPARISON: CT head 12/11/2022. CLINICAL HISTORY: Headache, fever. FINDINGS: BRAIN AND VENTRICLES: No acute infarct. No intracranial hemorrhage. No mass. No midline shift. No hydrocephalus. The sella is unremarkable. Normal flow voids. ORBITS: No acute abnormality. SINUSES AND MASTOIDS: Mild mucosal thickening in the ethmoid sinuses. Mucous retention cyst in the right maxillary sinus. No acute abnormality of the mastoids. BONES AND SOFT TISSUES: Normal marrow signal. No acute soft tissue abnormality. IMPRESSION: 1. No acute intracranial abnormality. 2. Mild ethmoid sinus mucosal thickening and a right maxillary sinus mucous retention cyst. Electronically signed by: Donnice Mania MD 08/11/2024 06:47 PM EST RP Workstation: [id number]    {Document cardiac monitor, telemetry assessment procedure when appropriate:32947} Procedures   Medications Ordered in the ED  HYDROmorphone  (DILAUDID ) injection 1 mg (has no administration in time range)  methylPREDNISolone  sodium succinate (SOLU-MEDROL ) 125 mg/2 mL injection 125 mg (has no administration in time range)  HYDROmorphone  (DILAUDID ) injection 1 mg (1 mg Intravenous Given 08/11/24 1731)  ondansetron  (ZOFRAN ) injection 4 mg (4 mg Intravenous Given 08/11/24 1730)  gadobutrol (GADAVIST) 1 MMOL/ML injection 6 mL (6 mLs Intravenous Contrast Given 08/11/24 1745)      {Click here for ABCD2, HEART and other calculators REFRESH Note before signing:1}                              Medical Decision Making Amount and/or Complexity of Data Reviewed Labs: ordered. Radiology: ordered.  Risk Prescription drug management.  Patient with cervical radiculopathy  {Document critical care time when appropriate  Document review of labs and clinical decision tools ie CHADS2VASC2, etc  Document your independent review of radiology images and any outside records  Document your discussion with family members, caretakers and with consultants  Document  social determinants of health affecting pt's care  Document your decision making why or why not admission, treatments were needed:32947:::1}   Final diagnoses:  Radiculopathy, unspecified spinal region    ED Discharge Orders          Ordered    predniSONE  (DELTASONE ) 10 MG tablet  Daily        08/11/24 1939    oxyCODONE -acetaminophen  (PERCOCET/ROXICET) 5-325 MG tablet  Every 6 hours PRN        08/11/24 1939             "

## 2024-08-11 NOTE — ED Triage Notes (Signed)
 Patient arrives via [facility]. Patient reports pain to her left arm and left side of her neck that started 2 hours ago. Denies any injury to her left arm.

## 2024-08-17 ENCOUNTER — Ambulatory Visit
Admission: EM | Admit: 2024-08-17 | Discharge: 2024-08-17 | Disposition: A | Discharge status: Home / Self Care | Attending: Nurse Practitioner | Admitting: Nurse Practitioner

## 2024-08-17 ENCOUNTER — Other Ambulatory Visit: Payer: Self-pay

## 2024-08-17 ENCOUNTER — Encounter: Payer: Self-pay | Admitting: Emergency Medicine

## 2024-08-17 DIAGNOSIS — J069 Acute upper respiratory infection, unspecified: Secondary | ICD-10-CM

## 2024-08-17 LAB — POC SOFIA SARS ANTIGEN FIA: SARS Coronavirus 2 Ag: NEGATIVE

## 2024-08-17 LAB — POCT RAPID STREP A (OFFICE): Rapid Strep A Screen: NEGATIVE

## 2024-08-17 LAB — POCT INFLUENZA A/B
Influenza A, POC: NEGATIVE
Influenza B, POC: NEGATIVE

## 2024-08-17 MED ORDER — PSEUDOEPH-BROMPHEN-DM 30-2-10 MG/5ML PO SYRP: 5.0000 mL | ORAL_SOLUTION | Freq: Four times a day (QID) | ORAL | 0 refills | Status: AC | PRN

## 2024-08-17 MED ORDER — PSEUDOEPH-BROMPHEN-DM 30-2-10 MG/5ML PO SYRP: 5.0000 mL | ORAL_SOLUTION | Freq: Four times a day (QID) | ORAL | 0 refills | Status: DC | PRN

## 2024-08-17 NOTE — Discharge Instructions (Signed)
 The COVID test, rapid strep test, and influenza test were negative. Take medication as prescribed. Increase fluids and allow for plenty of rest. You may take over-the-counter Tylenol  or ibuprofen  as needed for pain, fever, or general discomfort. Recommend a BRAT diet until nausea and vomiting improved. For the cough, recommend use of a humidifier in your bedroom at nighttime during sleep and sleeping elevated on pillows while cough symptoms persist. If symptoms fail to improve over the next 5 to 7 days, or appear to worsen, you may follow-up in this clinic or with your primary care physician for further evaluation. Follow-up as needed.
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
## 2024-08-17 NOTE — ED Triage Notes (Addendum)
 Pt reports cough, sore throat, emesis, nausea x1 week. Denies any known fevers.   Pt inquiring about covid/flu test due to elderly family members in home.
